# Patient Record
Sex: Male | Born: 1950 | Race: White | Hispanic: No | Marital: Married | Smoking: Current every day smoker
Health system: Southern US, Community
[De-identification: ages and names within clinical notes are randomized; demographics above are authoritative.]

## PROBLEM LIST (undated history)

## (undated) DIAGNOSIS — F419 Anxiety disorder, unspecified: Secondary | ICD-10-CM

## (undated) DIAGNOSIS — M1990 Unspecified osteoarthritis, unspecified site: Secondary | ICD-10-CM

## (undated) DIAGNOSIS — G629 Polyneuropathy, unspecified: Secondary | ICD-10-CM

## (undated) DIAGNOSIS — C801 Malignant (primary) neoplasm, unspecified: Secondary | ICD-10-CM

## (undated) DIAGNOSIS — C3490 Malignant neoplasm of unspecified part of unspecified bronchus or lung: Secondary | ICD-10-CM

## (undated) DIAGNOSIS — H9190 Unspecified hearing loss, unspecified ear: Secondary | ICD-10-CM

## (undated) DIAGNOSIS — E039 Hypothyroidism, unspecified: Secondary | ICD-10-CM

## (undated) DIAGNOSIS — C029 Malignant neoplasm of tongue, unspecified: Secondary | ICD-10-CM

## (undated) DIAGNOSIS — I10 Essential (primary) hypertension: Secondary | ICD-10-CM

## (undated) DIAGNOSIS — E7800 Pure hypercholesterolemia, unspecified: Secondary | ICD-10-CM

## (undated) DIAGNOSIS — J40 Bronchitis, not specified as acute or chronic: Secondary | ICD-10-CM

## (undated) DIAGNOSIS — F32A Depression, unspecified: Secondary | ICD-10-CM

## (undated) DIAGNOSIS — I499 Cardiac arrhythmia, unspecified: Secondary | ICD-10-CM

## (undated) DIAGNOSIS — K219 Gastro-esophageal reflux disease without esophagitis: Secondary | ICD-10-CM

## (undated) DIAGNOSIS — F329 Major depressive disorder, single episode, unspecified: Secondary | ICD-10-CM

## (undated) HISTORY — DX: Hypothyroidism, unspecified: E03.9

## (undated) HISTORY — DX: Malignant neoplasm of tongue, unspecified: C02.9

## (undated) HISTORY — DX: Pure hypercholesterolemia, unspecified: E78.00

## (undated) HISTORY — PX: BACK SURGERY: SHX140

## (undated) HISTORY — DX: Bronchitis, not specified as acute or chronic: J40

## (undated) HISTORY — PX: MOUTH SURGERY: SHX715

## (undated) HISTORY — PX: SPINAL FUSION: SHX223

## (undated) HISTORY — PX: OTHER SURGICAL HISTORY: SHX169

## (undated) HISTORY — PX: SHOULDER ARTHROSCOPY: SHX128

## (undated) HISTORY — PX: KNEE ARTHROSCOPY: SUR90

## (undated) HISTORY — DX: Essential (primary) hypertension: I10

## (undated) HISTORY — DX: Polyneuropathy, unspecified: G62.9

## (undated) HISTORY — PX: CERVICAL DISC SURGERY: SHX588

## (undated) HISTORY — PX: SPINE SURGERY: SHX786

---

## 2000-09-27 ENCOUNTER — Emergency Department (HOSPITAL_COMMUNITY): Admission: EM | Admit: 2000-09-27 | Discharge: 2000-09-27 | Payer: Self-pay | Admitting: Emergency Medicine

## 2002-07-02 ENCOUNTER — Encounter (HOSPITAL_COMMUNITY): Admission: RE | Admit: 2002-07-02 | Discharge: 2002-08-01 | Payer: Self-pay | Admitting: Orthopedic Surgery

## 2002-08-03 ENCOUNTER — Encounter (HOSPITAL_COMMUNITY): Admission: RE | Admit: 2002-08-03 | Discharge: 2002-09-02 | Payer: Self-pay | Admitting: Orthopedic Surgery

## 2002-08-23 ENCOUNTER — Inpatient Hospital Stay (HOSPITAL_COMMUNITY): Admission: RE | Admit: 2002-08-23 | Discharge: 2002-08-24 | Payer: Self-pay | Admitting: Neurosurgery

## 2002-08-23 ENCOUNTER — Encounter: Payer: Self-pay | Admitting: Neurosurgery

## 2003-07-18 ENCOUNTER — Ambulatory Visit (HOSPITAL_COMMUNITY): Admission: RE | Admit: 2003-07-18 | Discharge: 2003-07-18 | Payer: Self-pay | Admitting: Orthopaedic Surgery

## 2004-12-27 ENCOUNTER — Emergency Department (HOSPITAL_COMMUNITY): Admission: EM | Admit: 2004-12-27 | Discharge: 2004-12-27 | Payer: Self-pay | Admitting: Emergency Medicine

## 2005-04-03 ENCOUNTER — Ambulatory Visit: Admission: RE | Admit: 2005-04-03 | Discharge: 2005-06-14 | Payer: Self-pay | Admitting: Radiation Oncology

## 2006-07-31 ENCOUNTER — Ambulatory Visit (HOSPITAL_COMMUNITY): Admission: RE | Admit: 2006-07-31 | Discharge: 2006-07-31 | Payer: Self-pay | Admitting: Radiation Oncology

## 2006-09-26 ENCOUNTER — Emergency Department (HOSPITAL_COMMUNITY): Admission: EM | Admit: 2006-09-26 | Discharge: 2006-09-26 | Payer: Self-pay | Admitting: Emergency Medicine

## 2009-02-28 ENCOUNTER — Ambulatory Visit (HOSPITAL_COMMUNITY): Admission: RE | Admit: 2009-02-28 | Discharge: 2009-02-28 | Payer: Self-pay | Admitting: Family Medicine

## 2009-05-02 ENCOUNTER — Encounter (HOSPITAL_COMMUNITY): Admission: RE | Admit: 2009-05-02 | Discharge: 2009-06-01 | Payer: Self-pay | Admitting: Orthopaedic Surgery

## 2010-02-25 ENCOUNTER — Encounter: Payer: Self-pay | Admitting: Otolaryngology

## 2010-06-22 NOTE — Op Note (Signed)
NAME:  Darrell White, Darrell White                         ACCOUNT NO.:  [id number]   MEDICAL RECORD NO.:  [id number]                   PATIENT TYPE:  AMB   LOCATION:  DAY                                  FACILITY:  [facility]   PHYSICIAN:  J. Darreld Mclean, M.D.              DATE OF BIRTH:  16-Nov-1950   DATE OF PROCEDURE:  DATE OF DISCHARGE:                                 OPERATIVE REPORT   PREOPERATIVE DIAGNOSIS:  Olecranon bursitis, right elbow.   POSTOPERATIVE DIAGNOSIS:  Olecranon bursitis, right elbow.   PROCEDURE:  Excision of right olecranon bursa of the elbow.   ANESTHESIA:  Bier block.   SURGEON:  J. Darreld Mclean, M.D.   ASSISTANT:  Candace Cruise, [facility]   TOURNIQUET TIME:  Please refer to anesthesia record.   DRAIN:  Penrose drain was used.   The patient is a 60 year old male with pain and tenderness in his right  elbow. He has swelling and tenderness.  This has not resolved with  conservative treatment.  He has had it aspirated twice by his family  physician.  He is advised to have it excised.  The risks and imponderables  have been discussed with the patient preoperatively.  He appears to  understand and agreed to the procedure as outlined.  He understands that he  will have a Penrose drain and he is to come back to the office tomorrow for  removal.   DESCRIPTION OF PROCEDURE:  The patient was seen in the holding area.  His  right elbow was identified as the correct site of the surgery. I placed a  mark and he placed a mark on the elbow.  He was taken to the operating room  and Bier block anesthesia was given.  Once again, we reconfirmed the right  elbow as the correct anatomical site for excision of olecranon bursa.  The  patient was prepped and draped in the usual manner.  Bier block anesthesia  was given.   Incision was made directly over the lesion with careful dissection of the  bursa and the wound was excised.  It was opened during the procedure and  fluid was removed.   The bursa was removed without any difficulty.  Bovie was  used to help coagulate small bleeders and then the wound was reapproximated  using 2-0 plain and skin staples.  Penrose drain was inserted and a safety pin applied to it. It was not sewn  in.  A large bulky dressing applied. Ace bandage applied.  The patient will  be seen in the office tomorrow.  Went to recovery in good condition.  Prescription for Tylox given for pain.  If any difficulty come back to the  emergency room tonight.      ___________________________________________  Teola Bradley, M.D.   JWK/MEDQ  D:  07/18/2003  T:  07/18/2003  Job:  [id number]

## 2010-06-22 NOTE — H&P (Signed)
NAME:  Darrell White, Darrell White NO.:  [id number]   MEDICAL RECORD NO.:  [id number]                  PATIENT TYPE:   LOCATION:                                       FACILITY:   PHYSICIAN:  J. Darreld Mclean, M.D.              DATE OF BIRTH:   DATE OF ADMISSION:  DATE OF DISCHARGE:                                HISTORY & PHYSICAL   CHIEF COMPLAINT:  Right olecranon bursitis.   The patient is a 60 year old male with pain and tenderness in his right  elbow.  He has had swelling around the elbow for several weeks that became  progressively tender.  He was seen by his family doctors, Dr. Garner Nash and  Dr. Dimas Aguas, and has had it aspirated twice.  It was aspirated two days ago,  and it was already swollen again and tender.  He says it gets in his way,  and he would like to have it excised.  He thinks he hit it about a month  ago, he does not know exactly, but it has been swollen since then.   PAST MEDICAL HISTORY:  Negative except for a history of ulcer disease.  He  denies heart disease, lung disease, kidney disease, stroke, paralysis,  weakness, hypertension, diabetes, TB, cancer, and circulatory problems.   He denies any allergies.   He is currently taking Elavil 50 mg at night and Percocet 2-3 times a day as  needed.   He smokes 2-3 packs of cigarettes per day.  He does not use alcoholic  beverages.  He finished school through the 6th grade.   He has had seven back operations over the years in [place].  He has had  knee surgery last year by Dr. _____________ .  He has had shoulder surgery  in [place] and a ruptured disk in his neck at [facility].   His father had cancer, and he has had a brother and sister with cancer.   The patient lives in [place], and he is married.   PHYSICAL EXAMINATION:  VITAL SIGNS:  BP 122/80, pulse 100, respiration 20,  afebrile.  Height 5 feet 9-1/4.  Weight 220.  HEENT:  Negative.  NECK:  Supple.  LUNGS:  Clear to  P&A.  HEART:  Regular rhythm without murmur heard.  ABDOMEN:  Soft and nontender without masses.  EXTREMITIES:  Right elbow has a large olecranon bursa on the right, size of  maybe half of a lime.  It is tender.  It is not inflamed.  Range of motion  of the elbow is full.  Neurovascular is intact.  The other extremities  within normal limits.  CNS:  Intact.  SKIN:  Intact.   IMPRESSION:  Right elbow olecranon bursitis.   PLAN:  Excision of bursa.  I explained to the patient the risks and  imponderables of the procedure.  Have a drain placed to be removed the  following  day in the office.  His labs are pending.  The patient appears to  understand the procedure after the appropriate questions.     ___________________________________________                                         Teola Bradley, M.D.   JWK/MEDQ  D:  07/14/2003  T:  07/14/2003  Job:  [id number]

## 2010-06-22 NOTE — Op Note (Signed)
NAME:  Darrell White, Darrell White                         ACCOUNT NO.:  [id number]   MEDICAL RECORD NO.:  [id number]                   PATIENT TYPE:  INP   LOCATION:  3172                                 FACILITY:  [facility]   PHYSICIAN:  Hewitt Shorts, M.D.            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   PROCEDURE:  C5-6 and C6-7 anterior cervical diskectomy, arthrodesis with  allograft and tethered cervical plate.   SURGEON:  Hewitt Shorts, M.D.   ASSISTANT:  Danae Orleans. Venetia Maxon, M.D.   ANESTHESIA:  General endotracheal.   INDICATIONS:  The patient is a 60 year old man who presented with pain  extending from his neck to the right shoulder, down the right scapula into  the right arm.  MRI scan revealed spondylitic disk herniations at C5-6 and  C6-7 with underlying spondylosis and degenerative disk disease.  Decision  was made to proceed with two level anterior cervical diskectomy and  arthrodesis.   DESCRIPTION OF PROCEDURE:  The patient was brought to the operating room and  placed under general endotracheal anesthesia.  The patient was placed in 10  pounds of Holter traction.  The neck was prepped with Betadine soap and  solution and draped in a sterile fashion.  A horizontal incision was made on  the left side of the neck.  The line of the incision was infiltrated with  epinephrine.  Incision was made and carried down to the subcutaneous tissue.  Bipolar cautery and electrocautery were used to maintain hemostasis.  Dissection was carried down to the platysma.  Dissection was carried down to  the avascular plane and  surfaces of the mastoid and parotid artery injected  avoiding the trachea and esophagus medially.  The ventral aspect of the  vertebral column was identified and localizing x-ray was taken at C5-6 and  C6-7, intravertebral disk space identified.  Diskectomy was begun with the  incision on the annulus and continued with microcurets and pituitary  rongeurs.  The end plates of the corresponding vertebrae were removed using  microcurets and a Micromax drill.  The anterior aspect of the overgrowth was  removed using the Micromax drill along with an osteophyte removal tool.  The  microscope was draped and brought into the field to provide instrument  magnification, illumination, and visualization.  The remaining diskectomy  was performed using microdissection and microsurgical technique.  There was  significant posterior osteophytic overgrowth and small __________ overgrowth  at each level.  This was carefully removed with Micromax drill along with a  2 mm Kerrison punch with a thin flip plate.  The posterior longitudinal  ligament was thickened at each level and the pseudoimposed upon this was  calcified disk herniation as well as some soft disk herniation at each  level.  This was removed, and we were able to decompress the spinal canal  and thecal sac, and then we were able to decompress the foramina bilaterally  with particular attention paid to the right side of the foramina at both the  C5-6 and C6-7 levels.  In the end, PT compression of the thecal sac and  foramina was achieved bilaterally at each level, and once the decompression  was completed, hemostasis was established with the use of Gelfoam soaked in  thrombin.  Two 8 mm grafts of iliac crest tricortical graft were hydrated in  saline solution and then positioned in addition in the intravertebral disk  space at each level and countersunk.  The cervical traction was  discontinued.  We then selected a 35 mm tethered cervical plate  which was  positioned over the fusion construct and secured to the C5 and C7 vertebrae  with a pair of 4 x 14 mm screws and a single 4 x 14 mm screw was used at C6.  These screw holes were drilled and tapped.  The screw was placed.  The  screws were placed in an alternating fashion.  Once all five screws were  replaced, the final tightening was done over all five screws and then the  wound was irrigated with Bacitracin solution and checked for hemostasis  which was established and confirmed.  An x-ray was taken.  Visualization was  limited due to his large shoulders.  Screws were in good position at C5 and  C6.  The wound was then closed in multiple layers.  The platysma was closed  with interrupted inverted 2-0 undyed Vicryl sutures.  The subcutaneous and  subcuticular were closed with 3-0 undyed Vicryl sutures.  The skin was  approximated with Dermabond.  The patient tolerated the procedure well.  The  estimated blood loss 100 mL.  Sponge and needle counts were correct.  Following surgery, the patient is to be extubated and transferred to the  recovery room for further care.                                               Hewitt Shorts, M.D.    RWN/MEDQ  D:  08/23/2002  T:  08/23/2002  Job:  [id number]

## 2010-08-28 DIAGNOSIS — R072 Precordial pain: Secondary | ICD-10-CM

## 2012-03-25 ENCOUNTER — Encounter (HOSPITAL_COMMUNITY): Payer: Self-pay | Admitting: Pharmacy Technician

## 2012-04-02 ENCOUNTER — Encounter (HOSPITAL_COMMUNITY)
Admission: RE | Admit: 2012-04-02 | Discharge: 2012-04-02 | Disposition: A | Discharge status: Home / Self Care | Payer: Medicare Other | Source: Ambulatory Visit | Attending: Ophthalmology | Admitting: Ophthalmology

## 2012-04-02 ENCOUNTER — Other Ambulatory Visit: Payer: Self-pay

## 2012-04-02 ENCOUNTER — Encounter (HOSPITAL_COMMUNITY): Payer: Self-pay

## 2012-04-02 HISTORY — DX: Gastro-esophageal reflux disease without esophagitis: K21.9

## 2012-04-02 HISTORY — DX: Anxiety disorder, unspecified: F41.9

## 2012-04-02 HISTORY — DX: Unspecified hearing loss, unspecified ear: H91.90

## 2012-04-02 HISTORY — DX: Unspecified osteoarthritis, unspecified site: M19.90

## 2012-04-02 HISTORY — DX: Malignant (primary) neoplasm, unspecified: C80.1

## 2012-04-02 LAB — BASIC METABOLIC PANEL
BUN: 18 mg/dL (ref 6–23)
CO2: 26 mEq/L (ref 19–32)
Chloride: 100 mEq/L (ref 96–112)
Creatinine, Ser: 1.04 mg/dL (ref 0.50–1.35)
GFR calc Af Amer: 88 mL/min — ABNORMAL LOW (ref >90)
GFR calc non Af Amer: 76 mL/min — ABNORMAL LOW (ref >90)
Glucose, Bld: 150 mg/dL — ABNORMAL HIGH (ref 70–99)
Potassium: 4.1 mEq/L (ref 3.5–5.1)
Sodium: 137 mEq/L (ref 135–145)

## 2012-04-02 LAB — HEMOGLOBIN AND HEMATOCRIT, BLOOD
HCT: 40.4 % (ref 39.0–52.0)
Hemoglobin: 14 g/dL (ref 13.0–17.0)

## 2012-04-02 NOTE — Patient Instructions (Addendum)
Your procedure is scheduled on:  04/09/12  Report to [facility] at 12:30 PM.  Call this number if you have problems the morning of surgery: [phone]   Remember:   Do not eat or drink:After Midnight.  Take these medicines the morning of surgery with A SIP OF WATER: Fluoxetine. May take your Oxycodone only if needed.   Do not wear jewelry, make-up or nail polish.  Do not wear lotions, powders, or perfumes. You may wear deodorant.  Do not shave 48 hours prior to surgery. Men may shave face and neck.  Do not bring valuables to the hospital.  Contacts, dentures or bridgework may not be worn into surgery.  Leave suitcase in the car. After surgery it may be brought to your room.  For patients admitted to the hospital, checkout time is 11:00 AM the day of discharge.   Patients discharged the day of surgery will not be allowed to drive home.    Special Instructions: Start using your eye drops before surgery as directed by your eye doctor.   Please read over the following fact sheets that you were given: Anesthesia Post-op Instructions    Cataract Surgery  A cataract is a clouding of the lens of the eye. When a lens becomes cloudy, vision is reduced based on the degree and nature of the clouding. Surgery may be needed to improve vision. Surgery removes the cloudy lens and usually replaces it with a substitute lens (intraocular lens, IOL). LET YOUR EYE DOCTOR KNOW ABOUT:  Allergies to food or medicine.  Medicines taken including herbs, eyedrops, over-the-counter medicines, and creams.  Use of steroids (by mouth or creams).  Previous problems with anesthetics or numbing medicine.  History of bleeding problems or blood clots.  Previous surgery.  Other health problems, including diabetes and kidney problems.  Possibility of pregnancy, if this applies. RISKS AND COMPLICATIONS  Infection.  Inflammation of the eyeball (endophthalmitis) that can spread to both eyes (sympathetic  ophthalmia).  Poor wound healing.  If an IOL is inserted, it can later fall out of proper position. This is very uncommon.  Clouding of the part of your eye that holds an IOL in place. This is called an "after-cataract." These are uncommon, but easily treated. BEFORE THE PROCEDURE  Do not eat or drink anything except small amounts of water for 8 to 12 before your surgery, or as directed by your caregiver.  Unless you are told otherwise, continue any eyedrops you have been prescribed.  Talk to your primary caregiver about all other medicines that you take (both prescription and non-prescription). In some cases, you may need to stop or change medicines near the time of your surgery. This is most important if you are taking blood-thinning medicine.Do not stop medicines unless you are told to do so.  Arrange for someone to drive you to and from the procedure.  Do not put contact lenses in either eye on the day of your surgery. PROCEDURE There is more than one method for safely removing a cataract. Your doctor can explain the differences and help determine which is best for you. Phacoemulsification surgery is the most common form of cataract surgery.  An injection is given behind the eye or eyedrops are given to make this a painless procedure.  A small cut (incision) is made on the edge of the clear, dome-shaped surface that covers the front of the eye (cornea).  A tiny probe is painlessly inserted into the eye. This device gives off ultrasound waves  that soften and break up the cloudy center of the lens. This makes it easier for the cloudy lens to be removed by suction.  An IOL may be implanted.  The normal lens of the eye is covered by a clear capsule. Part of that capsule is intentionally left in the eye to support the IOL.  Your surgeon may or may not use stitches to close the incision. There are other forms of cataract surgery that require a larger incision and stiches to close the  eye. This approach is taken in cases where the doctor feels that the cataract cannot be easily removed using phacoemulsification. AFTER THE PROCEDURE  When an IOL is implanted, it does not need care. It becomes a permanent part of your eye and cannot be seen or felt.  Your doctor will schedule follow-up exams to check on your progress.  Review your other medicines with your doctor to see which can be resumed after surgery.  Use eyedrops or take medicine as prescribed by your doctor. Document Released: 01/10/2011 Document Revised: 04/15/2011 Document Reviewed: 01/10/2011 [facility] Patient Information 2013 [place], [place].    PATIENT INSTRUCTIONS POST-ANESTHESIA  IMMEDIATELY FOLLOWING SURGERY:  Do not drive or operate machinery for the first twenty four hours after surgery.  Do not make any important decisions for twenty four hours after surgery or while taking narcotic pain medications or sedatives.  If you develop intractable nausea and vomiting or a severe headache please notify your doctor immediately.  FOLLOW-UP:  Please make an appointment with your surgeon as instructed. You do not need to follow up with anesthesia unless specifically instructed to do so.  WOUND CARE INSTRUCTIONS (if applicable):  Keep a dry clean dressing on the anesthesia/puncture wound site if there is drainage.  Once the wound has quit draining you may leave it open to air.  Generally you should leave the bandage intact for twenty four hours unless there is drainage.  If the epidural site drains for more than 36-48 hours please call the anesthesia department.  QUESTIONS?:  Please feel free to call your physician or the hospital operator if you have any questions, and they will be happy to assist you.

## 2012-04-08 MED ORDER — CYCLOPENTOLATE-PHENYLEPHRINE 0.2-1 % OP SOLN
OPHTHALMIC | Status: AC
  Filled 2012-04-08: qty 2

## 2012-04-08 MED ORDER — LIDOCAINE HCL (PF) 1 % IJ SOLN
INTRAMUSCULAR | Status: AC
  Filled 2012-04-08: qty 2

## 2012-04-08 MED ORDER — TETRACAINE HCL 0.5 % OP SOLN
OPHTHALMIC | Status: AC
  Filled 2012-04-08: qty 2

## 2012-04-08 MED ORDER — LIDOCAINE HCL 3.5 % OP GEL
OPHTHALMIC | Status: AC
  Filled 2012-04-08: qty 5

## 2012-04-08 MED ORDER — NEOMYCIN-POLYMYXIN-DEXAMETH 3.5-10000-0.1 OP OINT
TOPICAL_OINTMENT | OPHTHALMIC | Status: AC
  Filled 2012-04-08: qty 3.5

## 2012-04-09 ENCOUNTER — Ambulatory Visit (HOSPITAL_COMMUNITY)
Admission: RE | Admit: 2012-04-09 | Discharge: 2012-04-09 | Disposition: A | Discharge status: Home / Self Care | Payer: Medicare Other | Source: Ambulatory Visit | Attending: Ophthalmology | Admitting: Ophthalmology

## 2012-04-09 ENCOUNTER — Encounter (HOSPITAL_COMMUNITY): Payer: Self-pay | Admitting: Anesthesiology

## 2012-04-09 ENCOUNTER — Ambulatory Visit (HOSPITAL_COMMUNITY): Payer: Medicare Other | Admitting: Anesthesiology

## 2012-04-09 ENCOUNTER — Encounter (HOSPITAL_COMMUNITY)
Admission: RE | Disposition: A | Discharge status: Home / Self Care | Payer: Self-pay | Source: Ambulatory Visit | Attending: Ophthalmology

## 2012-04-09 ENCOUNTER — Encounter (HOSPITAL_COMMUNITY): Payer: Self-pay | Admitting: *Deleted

## 2012-04-09 DIAGNOSIS — H2589 Other age-related cataract: Secondary | ICD-10-CM | POA: Insufficient documentation

## 2012-04-09 DIAGNOSIS — Z01810 Encounter for preprocedural cardiovascular examination: Secondary | ICD-10-CM | POA: Insufficient documentation

## 2012-04-09 DIAGNOSIS — Z01812 Encounter for preprocedural laboratory examination: Secondary | ICD-10-CM | POA: Insufficient documentation

## 2012-04-09 DIAGNOSIS — H26119 Localized traumatic opacities, unspecified eye: Secondary | ICD-10-CM | POA: Insufficient documentation

## 2012-04-09 HISTORY — PX: CATARACT EXTRACTION W/PHACO: SHX586

## 2012-04-09 SURGERY — PHACOEMULSIFICATION, CATARACT, WITH IOL INSERTION
Anesthesia: Monitor Anesthesia Care | Site: Eye | Laterality: Left | Wound class: Clean

## 2012-04-09 MED ORDER — BSS IO SOLN
INTRAOCULAR | Status: DC | PRN
  Administered 2012-04-09: 15 mL via INTRAOCULAR

## 2012-04-09 MED ORDER — MIDAZOLAM HCL 2 MG/2ML IJ SOLN
1.0000 mg | INTRAMUSCULAR | Status: DC | PRN
  Administered 2012-04-09: 2 mg via INTRAVENOUS

## 2012-04-09 MED ORDER — ONDANSETRON HCL 4 MG/2ML IJ SOLN: 4.0000 mg | Freq: Once | INTRAMUSCULAR | Status: AC | PRN

## 2012-04-09 MED ORDER — PROVISC 10 MG/ML IO SOLN
INTRAOCULAR | Status: DC | PRN
  Administered 2012-04-09: 8.5 mg via INTRAOCULAR

## 2012-04-09 MED ORDER — POVIDONE-IODINE 5 % OP SOLN
OPHTHALMIC | Status: DC | PRN
  Administered 2012-04-09: 1 via OPHTHALMIC

## 2012-04-09 MED ORDER — TETRACAINE HCL 0.5 % OP SOLN
1.0000 [drp] | OPHTHALMIC | Status: AC
  Administered 2012-04-09 (×3): 1 [drp] via OPHTHALMIC

## 2012-04-09 MED ORDER — EPINEPHRINE HCL 1 MG/ML IJ SOLN
INTRAOCULAR | Status: DC | PRN
  Administered 2012-04-09: 12:00:00

## 2012-04-09 MED ORDER — LIDOCAINE 3.5 % OP GEL OPTIME - NO CHARGE
OPHTHALMIC | Status: DC | PRN
  Administered 2012-04-09: 1 [drp] via OPHTHALMIC

## 2012-04-09 MED ORDER — PHENYLEPHRINE HCL 2.5 % OP SOLN
OPHTHALMIC | Status: AC
  Filled 2012-04-09: qty 2

## 2012-04-09 MED ORDER — NEOMYCIN-POLYMYXIN-DEXAMETH 0.1 % OP OINT
TOPICAL_OINTMENT | OPHTHALMIC | Status: DC | PRN
  Administered 2012-04-09: 1 via OPHTHALMIC

## 2012-04-09 MED ORDER — MIDAZOLAM HCL 2 MG/2ML IJ SOLN
INTRAMUSCULAR | Status: AC
  Filled 2012-04-09: qty 2

## 2012-04-09 MED ORDER — PHENYLEPHRINE HCL 2.5 % OP SOLN
1.0000 [drp] | OPHTHALMIC | Status: AC
  Administered 2012-04-09 (×3): 1 [drp] via OPHTHALMIC

## 2012-04-09 MED ORDER — EPINEPHRINE HCL 1 MG/ML IJ SOLN
INTRAMUSCULAR | Status: AC
  Filled 2012-04-09: qty 1

## 2012-04-09 MED ORDER — LACTATED RINGERS IV SOLN
INTRAVENOUS | Status: DC
  Administered 2012-04-09: 12:00:00 via INTRAVENOUS

## 2012-04-09 MED ORDER — TRYPAN BLUE 0.06 % OP SOLN
OPHTHALMIC | Status: AC
  Filled 2012-04-09: qty 0.5

## 2012-04-09 MED ORDER — CYCLOPENTOLATE-PHENYLEPHRINE 0.2-1 % OP SOLN
1.0000 [drp] | OPHTHALMIC | Status: AC
  Administered 2012-04-09 (×3): 1 [drp] via OPHTHALMIC

## 2012-04-09 MED ORDER — FENTANYL CITRATE 0.05 MG/ML IJ SOLN: 25.0000 ug | INTRAMUSCULAR | Status: DC | PRN

## 2012-04-09 MED ORDER — LIDOCAINE HCL (PF) 1 % IJ SOLN
INTRAMUSCULAR | Status: DC | PRN
  Administered 2012-04-09: .5 mL

## 2012-04-09 MED ORDER — LIDOCAINE HCL 3.5 % OP GEL
1.0000 "application " | Freq: Once | OPHTHALMIC | Status: AC
  Administered 2012-04-09: 1 via OPHTHALMIC

## 2012-04-09 SURGICAL SUPPLY — 32 items

## 2012-04-09 NOTE — OR Nursing (Signed)
States he has intolerance to anti inflamatory medication. Unsure of med. names

## 2012-04-09 NOTE — H&P (Signed)
I have reviewed the H&P, the patient was re-examined, and I have identified no interval changes in medical condition and plan of care since the history and physical of record  

## 2012-04-09 NOTE — OR Nursing (Signed)
Morphine pump used for back and leg pain

## 2012-04-09 NOTE — Anesthesia Preprocedure Evaluation (Signed)
Anesthesia Evaluation  Patient identified by MRN, date of birth, ID band Patient awake    Reviewed: Allergy & Precautions, H&P , NPO status , Patient's Chart, lab work & pertinent test results  Airway Mallampati: II      Dental  (+) Edentulous Upper and Edentulous Lower   Pulmonary neg pulmonary ROS,  breath sounds clear to auscultation        Cardiovascular negative cardio ROS  Rhythm:Regular Rate:Normal     Neuro/Psych Anxiety    GI/Hepatic GERD-  Medicated and Controlled,  Endo/Other    Renal/GU      Musculoskeletal  (+) Arthritis - (cervical fusion),   Abdominal   Peds  Hematology   Anesthesia Other Findings   Reproductive/Obstetrics                           Anesthesia Physical Anesthesia Plan  ASA: II  Anesthesia Plan: MAC   Post-op Pain Management:    Induction: Intravenous  Airway Management Planned: Nasal Cannula  Additional Equipment:   Intra-op Plan:   Post-operative Plan:   Informed Consent: I have reviewed the patients History and Physical, chart, labs and discussed the procedure including the risks, benefits and alternatives for the proposed anesthesia with the patient or authorized representative who has indicated his/her understanding and acceptance.     Plan Discussed with:   Anesthesia Plan Comments:         Anesthesia Quick Evaluation

## 2012-04-09 NOTE — Anesthesia Postprocedure Evaluation (Signed)
  Anesthesia Post-op Note  Patient: Darrell White  Procedure(s) Performed: Procedure(s) (LRB): CATARACT EXTRACTION PHACO AND INTRAOCULAR LENS PLACEMENT (IOC) (Left)  Patient Location:  Short Stay  Anesthesia Type: MAC  Level of Consciousness: awake  Airway and Oxygen Therapy: Patient Spontanous Breathing  Post-op Pain: none  Post-op Assessment: Post-op Vital signs reviewed, Patient's Cardiovascular Status Stable, Respiratory Function Stable, Patent Airway, No signs of Nausea or vomiting and Pain level controlled  Post-op Vital Signs: Reviewed and stable  Complications: No apparent anesthesia complications

## 2012-04-09 NOTE — OR Nursing (Signed)
Morphine pump in right side ,  Unable to state dosage

## 2012-04-09 NOTE — OR Nursing (Signed)
Takes a nerve pill unsure of name and takes daily.

## 2012-04-09 NOTE — Brief Op Note (Signed)
Pre-Op Dx: Cataract OS Post-Op Dx: Cataract OS Surgeon: Hunt, Kerry Anesthesia: Topical with MAC Surgery: Cataract Extraction with Intraocular lens Implant OS Implant: B&L enVista Specimen: None Complications: None 

## 2012-04-09 NOTE — Transfer of Care (Signed)
Immediate Anesthesia Transfer of Care Note  Patient: Darrell White  Procedure(s) Performed: Procedure(s) (LRB): CATARACT EXTRACTION PHACO AND INTRAOCULAR LENS PLACEMENT (IOC) (Left)  Patient Location: Shortstay  Anesthesia Type: MAC  Level of Consciousness: awake  Airway & Oxygen Therapy: Patient Spontanous Breathing   Post-op Assessment: Report given to PACU RN, Post -op Vital signs reviewed and stable and Patient moving all extremities  Post vital signs: Reviewed and stable  Complications: No apparent anesthesia complications

## 2012-04-10 NOTE — Op Note (Signed)
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  PROCEDURE PERFORMED:  Phacoemulsification, posterior chamber intraocular lens implantation, left eye.  SURGEON:  Susanne Greenhouse, MD  ANESTHESIA:  Topical with monitored anesthesia care and IV sedation.  OPERATIVE SUMMARY:  In the preoperative area, dilating drops were placed into the left eye.  The patient was then brought into the operating room where she was placed under general anesthesia.  The eye was then prepped and draped.  Beginning with a 75 blade, a paracentesis port was made at the surgeon's 2 o'clock position.  The anterior chamber was then filled with a 1% nonpreserved lidocaine solution with epinephrine.  This was followed by Viscoat to deepen the chamber.  A small fornix-based peritomy was performed superiorly.  Next, a single iris hook was placed through the limbus superiorly.  A 2.4-mm keratome blade was then used to make a clear corneal incision over the iris hook.  A bent cystotome needle and Utrata forceps were used to create a continuous tear capsulotomy.  Hydrodissection was performed using balanced salt solution on a fine cannula.  The lens nucleus was then removed using phacoemulsification in a quadrant cracking technique.  The cortical material was then removed with irrigation and aspiration.  The capsular bag and anterior chamber were refilled with Provisc.  The wound was widened to approximately 3 mm and a posterior  chamber intraocular lens was placed into the capsular bag without difficulty using an [employer] lens injecting system.  A single 10-0 nylon suture was then used to close the incision as well as stromal hydration.  The Provisc was removed from the anterior chamber and capsular bag with irrigation and aspiration.  At this point, the wounds were tested for leak, which were negative.  The anterior chamber remained deep and stable.  The patient tolerated the procedure well.  There were no operative complications, and she awoke from general anesthesia without problem.  No surgical specimens.  Prosthetic device used is a Bausch and Lomb posterior chamber lens, model MX60, power of 22.0, serial number is [id number].          ______________________________ Susanne Greenhouse, MD     KEH/MEDQ  D:  04/09/2012  T:  04/10/2012  Job:  [id number]

## 2012-04-14 ENCOUNTER — Encounter (HOSPITAL_COMMUNITY): Payer: Self-pay | Admitting: Ophthalmology

## 2012-08-19 ENCOUNTER — Emergency Department (HOSPITAL_COMMUNITY)
Admission: EM | Admit: 2012-08-19 | Discharge: 2012-08-19 | Disposition: A | Discharge status: Home / Self Care | Payer: Medicare Other | Attending: Emergency Medicine | Admitting: Emergency Medicine

## 2012-08-19 ENCOUNTER — Emergency Department (HOSPITAL_COMMUNITY): Payer: Medicare Other

## 2012-08-19 ENCOUNTER — Encounter (HOSPITAL_COMMUNITY): Payer: Self-pay

## 2012-08-19 DIAGNOSIS — Z8739 Personal history of other diseases of the musculoskeletal system and connective tissue: Secondary | ICD-10-CM | POA: Insufficient documentation

## 2012-08-19 DIAGNOSIS — R51 Headache: Secondary | ICD-10-CM | POA: Insufficient documentation

## 2012-08-19 DIAGNOSIS — R6884 Jaw pain: Secondary | ICD-10-CM | POA: Insufficient documentation

## 2012-08-19 DIAGNOSIS — Z85819 Personal history of malignant neoplasm of unspecified site of lip, oral cavity, and pharynx: Secondary | ICD-10-CM | POA: Insufficient documentation

## 2012-08-19 DIAGNOSIS — J029 Acute pharyngitis, unspecified: Secondary | ICD-10-CM | POA: Insufficient documentation

## 2012-08-19 DIAGNOSIS — M542 Cervicalgia: Secondary | ICD-10-CM | POA: Insufficient documentation

## 2012-08-19 DIAGNOSIS — Z9889 Other specified postprocedural states: Secondary | ICD-10-CM | POA: Insufficient documentation

## 2012-08-19 DIAGNOSIS — F17200 Nicotine dependence, unspecified, uncomplicated: Secondary | ICD-10-CM | POA: Insufficient documentation

## 2012-08-19 DIAGNOSIS — F411 Generalized anxiety disorder: Secondary | ICD-10-CM | POA: Insufficient documentation

## 2012-08-19 DIAGNOSIS — H9190 Unspecified hearing loss, unspecified ear: Secondary | ICD-10-CM | POA: Insufficient documentation

## 2012-08-19 DIAGNOSIS — Z8719 Personal history of other diseases of the digestive system: Secondary | ICD-10-CM | POA: Insufficient documentation

## 2012-08-19 LAB — CBC WITH DIFFERENTIAL/PLATELET
Basophils Absolute: 0.1 10*3/uL (ref 0.0–0.1)
HCT: 40 % (ref 39.0–52.0)
Lymphocytes Relative: 27 % (ref 12–46)
Neutro Abs: 3.5 10*3/uL (ref 1.7–7.7)
Platelets: 174 10*3/uL (ref 150–400)
RDW: 13.3 % (ref 11.5–15.5)
WBC: 5.7 10*3/uL (ref 4.0–10.5)

## 2012-08-19 LAB — BASIC METABOLIC PANEL
CO2: 32 mEq/L (ref 19–32)
Chloride: 100 mEq/L (ref 96–112)
Potassium: 4 mEq/L (ref 3.5–5.1)
Sodium: 137 mEq/L (ref 135–145)

## 2012-08-19 MED ORDER — HYDROMORPHONE HCL PF 1 MG/ML IJ SOLN
1.0000 mg | Freq: Once | INTRAMUSCULAR | Status: AC
  Administered 2012-08-19: 1 mg via INTRAVENOUS
  Filled 2012-08-19: qty 1

## 2012-08-19 MED ORDER — CEPHALEXIN 500 MG PO CAPS: 500.0000 mg | ORAL_CAPSULE | Freq: Four times a day (QID) | ORAL | Status: DC

## 2012-08-19 MED ORDER — MORPHINE SULFATE 4 MG/ML IJ SOLN
6.0000 mg | Freq: Once | INTRAMUSCULAR | Status: AC
  Administered 2012-08-19: 6 mg via INTRAVENOUS
  Filled 2012-08-19: qty 2

## 2012-08-19 MED ORDER — LORAZEPAM 2 MG/ML IJ SOLN
0.5000 mg | Freq: Once | INTRAMUSCULAR | Status: AC
  Administered 2012-08-19: 0.5 mg via INTRAVENOUS
  Filled 2012-08-19: qty 1

## 2012-08-19 MED ORDER — ONDANSETRON HCL 4 MG/2ML IJ SOLN
4.0000 mg | Freq: Once | INTRAMUSCULAR | Status: AC
  Administered 2012-08-19: 4 mg via INTRAVENOUS
  Filled 2012-08-19: qty 2

## 2012-08-19 MED ORDER — TRAMADOL HCL 50 MG PO TABS: 50.0000 mg | ORAL_TABLET | Freq: Four times a day (QID) | ORAL | Status: DC | PRN

## 2012-08-19 MED ORDER — IOHEXOL 300 MG/ML  SOLN
75.0000 mL | Freq: Once | INTRAMUSCULAR | Status: AC | PRN
  Administered 2012-08-19: 75 mL via INTRAVENOUS

## 2012-08-19 NOTE — ED Notes (Signed)
Sore throat, neck pain (steel rods in my neck), and I have a headache. No history of headaches per pt.

## 2012-08-19 NOTE — ED Notes (Signed)
Pt laying in bed with lights out, c/o of headache and sore throat x 3 days

## 2012-08-19 NOTE — ED Provider Notes (Signed)
History    This chart was scribed for Darrell Lennert, MD by Darrell White, ED Scribe. This patient was seen in room [facility] and the patient's care was started at 1955.  CSN: [id number] Arrival date & time 08/19/12  1943  First MD Initiated Contact with Patient 08/19/12 1955     Chief Complaint  Patient presents with  . Headache  . Neck Pain    Patient is a 62 y.o. male presenting with headaches and neck pain. The history is provided by the patient. No language interpreter was used.  Headache Pain location:  L temporal and L parietal Radiates to:  Does not radiate Onset quality:  Gradual Chronicity:  New Similar to prior headaches: no   Relieved by:  Nothing Worsened by:  Nothing tried Ineffective treatments:  Prescription medications Associated symptoms: neck pain   Associated symptoms: no abdominal pain, no back pain, no congestion, no cough, no diarrhea, no fatigue, no fever, no numbness, no seizures and no sinus pressure   Neck Pain Pain location:  L side Pain severity:  Moderate Pain is:  Unable to specify Onset quality:  Gradual Duration:  3 days Timing:  Constant Progression:  Unchanged Chronicity:  New Relieved by:  Nothing Worsened by:  Nothing tried Ineffective treatments: Percocet. Associated symptoms: headaches   Associated symptoms: no chest pain, no fever, no numbness and no tingling    HPI Comments: Darrell White is a 62 y.o. male who presents to the Emergency Department complaining of 3 days of gradual onset, gradually worsening sore throat with left sided jaw pain that radiates down his neck. He states he has steel rods in his neck. He also complains of a severe left sided HA. He tried percocet with little relief. He denies prior similar episodes. He denies fever, pain when swallowing, arm pain or any other pain.  Past Medical History  Diagnosis Date  . Anxiety   . GERD (gastroesophageal reflux disease)   . Cancer     of mouth  . Arthritis    . HOH (hard of hearing)    Past Surgical History  Procedure Laterality Date  . Mouth surgery      removal of cancer  . Cervical disc surgery    . Shoulder arthroscopy      bilateral  . Back surgery      had total of 7 surgeries with rods and plates  . Spinal fusion    . Spine surgery      insertion of morphine pump into spine  . Knee arthroscopy      right knee  . Cataract extraction w/phaco Left 04/09/2012    Procedure: CATARACT EXTRACTION PHACO AND INTRAOCULAR LENS PLACEMENT (IOC);  Surgeon: Gemma Payor, MD;  Location: [facility];  Service: Ophthalmology;  Laterality: Left;  CDE: 12.44   No family history on file. History  Substance Use Topics  . Smoking status: Current Every Day Smoker -- 1.50 packs/day for 50 years    Types: Cigarettes  . Smokeless tobacco: Not on file  . Alcohol Use: No    Review of Systems  Constitutional: Negative for fever, appetite change and fatigue.  HENT: Positive for neck pain. Negative for congestion, sinus pressure and ear discharge.   Eyes: Negative for discharge.  Respiratory: Negative for cough.   Cardiovascular: Negative for chest pain.  Gastrointestinal: Negative for abdominal pain and diarrhea.  Genitourinary: Negative for frequency and hematuria.  Musculoskeletal: Negative for back pain.  Skin: Negative for rash.  Neurological: Positive for headaches. Negative for tingling, seizures and numbness.  Psychiatric/Behavioral: Negative for hallucinations.    Allergies  Review of patient's allergies indicates no known allergies.  Home Medications   Current Outpatient Rx  Name  Route  Sig  Dispense  Refill  . FLUoxetine (PROZAC) 20 MG capsule   Oral   Take 20 mg by mouth daily.         Marland Kitchen oxyCODONE-acetaminophen (PERCOCET) 10-325 MG per tablet   Oral   Take 1 tablet by mouth 4 (four) times daily as needed for pain.          BP 150/59  Pulse 71  Temp(Src) 97.7 F (36.5 C) (Oral)  Resp 20  Ht 5\' 8"  (1.727 m)  Wt 220 lb (99.791  kg)  BMI 33.46 kg/m2  SpO2 99%  Physical Exam  Constitutional: He is oriented to person, place, and time. He appears well-developed.  HENT:  Head: Normocephalic.  Eyes: Conjunctivae and EOM are normal. No scleral icterus.  Neck: Neck supple. No thyromegaly present.  Lateral anterior neck tender. No masses felt.  Cardiovascular: Normal rate and regular rhythm.  Exam reveals no gallop and no friction rub.   No murmur heard. Pulmonary/Chest: No stridor. He has no wheezes. He has no rales. He exhibits no tenderness.  Abdominal: He exhibits no distension. There is no tenderness. There is no rebound.  Musculoskeletal: Normal range of motion. He exhibits no edema.  Lymphadenopathy:    He has no cervical adenopathy.  Neurological: He is oriented to person, place, and time. Coordination normal.  Skin: No rash noted. No erythema.  Psychiatric: He has a normal mood and affect. His behavior is normal.    ED Course  Procedures (including critical care time) DIAGNOSTIC STUDIES: Oxygen Saturation is 99% on RA, normal by my interpretation.    COORDINATION OF CARE: 7:56 PM Discussed treatment plan which includes Dilaudid, Zofran, CBC and basic metabolic panel with pt at bedside and pt agreed to plan.   10:48 PM Rechecked pt, who reports feeling improved. Results discussed. Will discharge home.  Results for orders placed during the hospital encounter of 08/19/12  CBC WITH DIFFERENTIAL      Result Value Range   WBC 5.7  4.0 - 10.5 K/uL   RBC 4.37  4.22 - 5.81 MIL/uL   Hemoglobin 13.6  13.0 - 17.0 g/dL   HCT 16.1  09.6 - 04.5 %   MCV 91.5  78.0 - 100.0 fL   MCH 31.1  26.0 - 34.0 pg   MCHC 34.0  30.0 - 36.0 g/dL   RDW 40.9  81.1 - 91.4 %   Platelets 174  150 - 400 K/uL   Neutrophils Relative % 61  43 - 77 %   Neutro Abs 3.5  1.7 - 7.7 K/uL   Lymphocytes Relative 27  12 - 46 %   Lymphs Abs 1.5  0.7 - 4.0 K/uL   Monocytes Relative 8  3 - 12 %   Monocytes Absolute 0.5  0.1 - 1.0 K/uL    Eosinophils Relative 3  0 - 5 %   Eosinophils Absolute 0.2  0.0 - 0.7 K/uL   Basophils Relative 1  0 - 1 %   Basophils Absolute 0.1  0.0 - 0.1 K/uL  BASIC METABOLIC PANEL      Result Value Range   Sodium 137  135 - 145 mEq/L   Potassium 4.0  3.5 - 5.1 mEq/L   Chloride 100  96 - 112  mEq/L   CO2 32  19 - 32 mEq/L   Glucose, Bld 99  70 - 99 mg/dL   BUN 13  6 - 23 mg/dL   Creatinine, Ser 1.61  0.50 - 1.35 mg/dL   Calcium 9.8  8.4 - 09.6 mg/dL   GFR calc non Af Amer 77 (*) >90 mL/min   GFR calc Af Amer 90 (*) >90 mL/min   Ct Head Wo Contrast  08/19/2012   *RADIOLOGY REPORT*  Clinical Data: Headache for 3 days.  CT HEAD WITHOUT CONTRAST  Technique:  Contiguous axial images were obtained from the base of the skull through the vertex without contrast.  Comparison: None.  Findings:  Calvarium: No acute osseous abnormality.  No lytic or blastic lesion.  Orbits: No acute abnormality.  Brain: No evidence of acute abnormality, such as acute infarction, hemorrhage, hydrocephalus, or mass lesion/mass effect.  IMPRESSION:  Negative for acute intracranial abnormality.   Original Report Authenticated By: Tiburcio Pea   Ct Soft Tissue Neck W Contrast  08/19/2012   *RADIOLOGY REPORT*  Clinical Data: Left neck pain and sore throat.  Pain radiating down neck.  History of cancer status post resection and radiotherapy.  CT NECK WITH CONTRAST  Technique:  Multidetector CT imaging of the neck was performed with intravenous contrast.  Contrast: 75mL OMNIPAQUE IOHEXOL 300 MG/ML  SOLN .  Comparison: Head CT 07/31/2006  Findings: Asymmetry of the left oral tounge, the distorted appearance and slightly more surface enhancement.   No definitive mass along the surfaces of the aerodigestive tract.  No suspicious nodal enlargement.  Radiation changes including biapical fibrosis and salivary gland atrophy, particularly the submandibular glands. No evidence of osteoradionecrosis.  Cervical carotid atherosclerosis, advanced at the  bifurcations, with high-grade left external carotid artery stenosis.  The atherosclerosis has been accelerated by radiotherapy presumably.  By apical lungs are clear.  No acute osseous abnormality.  There has been C5-6 and C6-7 ACDF with ventral plate screw.  Uncertain if there is complete osseous fusion across the C6-7 level where the disc space and endplates remain well seen.  No evident hardware displacement.  IMPRESSION: 1. No acute abnormality to explain neck pain. 2.  Distortion of the left oral tongue, which may be postoperative. Recommend correlation with mucosal examination.  No lymphadenopathy in the neck. 3.  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   Original Report Authenticated By: Tiburcio Pea      No diagnosis found.  MDM  Neck pain,  Possible adenitis.  tx with keflex and follow up   The chart was scribed for me under my direct supervision.  I personally performed the history, physical, and medical decision making and all procedures in the evaluation of this patient.Darrell Lennert, MD 08/19/12 2256

## 2012-09-29 DIAGNOSIS — Z95828 Presence of other vascular implants and grafts: Secondary | ICD-10-CM | POA: Insufficient documentation

## 2015-05-05 DIAGNOSIS — E039 Hypothyroidism, unspecified: Secondary | ICD-10-CM | POA: Diagnosis not present

## 2015-05-05 DIAGNOSIS — R1310 Dysphagia, unspecified: Secondary | ICD-10-CM | POA: Diagnosis not present

## 2015-05-05 DIAGNOSIS — R072 Precordial pain: Secondary | ICD-10-CM | POA: Diagnosis not present

## 2015-05-05 DIAGNOSIS — E782 Mixed hyperlipidemia: Secondary | ICD-10-CM | POA: Diagnosis not present

## 2015-05-11 DIAGNOSIS — R079 Chest pain, unspecified: Secondary | ICD-10-CM | POA: Diagnosis not present

## 2015-06-12 DIAGNOSIS — F17210 Nicotine dependence, cigarettes, uncomplicated: Secondary | ICD-10-CM | POA: Diagnosis not present

## 2015-06-12 DIAGNOSIS — E782 Mixed hyperlipidemia: Secondary | ICD-10-CM | POA: Diagnosis not present

## 2015-06-12 DIAGNOSIS — E039 Hypothyroidism, unspecified: Secondary | ICD-10-CM | POA: Diagnosis not present

## 2015-06-12 DIAGNOSIS — K219 Gastro-esophageal reflux disease without esophagitis: Secondary | ICD-10-CM | POA: Diagnosis not present

## 2015-06-12 DIAGNOSIS — F331 Major depressive disorder, recurrent, moderate: Secondary | ICD-10-CM | POA: Diagnosis not present

## 2015-06-12 DIAGNOSIS — M545 Low back pain: Secondary | ICD-10-CM | POA: Diagnosis not present

## 2015-07-19 DIAGNOSIS — F329 Major depressive disorder, single episode, unspecified: Secondary | ICD-10-CM | POA: Diagnosis not present

## 2015-07-19 DIAGNOSIS — M5136 Other intervertebral disc degeneration, lumbar region: Secondary | ICD-10-CM | POA: Diagnosis not present

## 2015-07-19 DIAGNOSIS — G8929 Other chronic pain: Secondary | ICD-10-CM | POA: Diagnosis not present

## 2015-07-19 DIAGNOSIS — Z79891 Long term (current) use of opiate analgesic: Secondary | ICD-10-CM | POA: Diagnosis not present

## 2015-07-19 DIAGNOSIS — M5137 Other intervertebral disc degeneration, lumbosacral region: Secondary | ICD-10-CM | POA: Diagnosis not present

## 2015-12-12 DIAGNOSIS — F17210 Nicotine dependence, cigarettes, uncomplicated: Secondary | ICD-10-CM | POA: Diagnosis not present

## 2015-12-12 DIAGNOSIS — K210 Gastro-esophageal reflux disease with esophagitis: Secondary | ICD-10-CM | POA: Diagnosis not present

## 2015-12-12 DIAGNOSIS — E039 Hypothyroidism, unspecified: Secondary | ICD-10-CM | POA: Diagnosis not present

## 2015-12-12 DIAGNOSIS — R739 Hyperglycemia, unspecified: Secondary | ICD-10-CM | POA: Diagnosis not present

## 2015-12-12 DIAGNOSIS — C002 Malignant neoplasm of external lip, unspecified: Secondary | ICD-10-CM | POA: Diagnosis not present

## 2015-12-12 DIAGNOSIS — E782 Mixed hyperlipidemia: Secondary | ICD-10-CM | POA: Diagnosis not present

## 2015-12-14 DIAGNOSIS — Z0001 Encounter for general adult medical examination with abnormal findings: Secondary | ICD-10-CM | POA: Diagnosis not present

## 2015-12-14 DIAGNOSIS — E782 Mixed hyperlipidemia: Secondary | ICD-10-CM | POA: Diagnosis not present

## 2015-12-14 DIAGNOSIS — M545 Low back pain: Secondary | ICD-10-CM | POA: Diagnosis not present

## 2015-12-14 DIAGNOSIS — F17210 Nicotine dependence, cigarettes, uncomplicated: Secondary | ICD-10-CM | POA: Diagnosis not present

## 2015-12-14 DIAGNOSIS — K219 Gastro-esophageal reflux disease without esophagitis: Secondary | ICD-10-CM | POA: Diagnosis not present

## 2015-12-14 DIAGNOSIS — F331 Major depressive disorder, recurrent, moderate: Secondary | ICD-10-CM | POA: Diagnosis not present

## 2015-12-14 DIAGNOSIS — E039 Hypothyroidism, unspecified: Secondary | ICD-10-CM | POA: Diagnosis not present

## 2015-12-14 DIAGNOSIS — Z6829 Body mass index (BMI) 29.0-29.9, adult: Secondary | ICD-10-CM | POA: Diagnosis not present

## 2016-01-15 DIAGNOSIS — Z9689 Presence of other specified functional implants: Secondary | ICD-10-CM | POA: Diagnosis not present

## 2016-01-15 DIAGNOSIS — F17200 Nicotine dependence, unspecified, uncomplicated: Secondary | ICD-10-CM | POA: Diagnosis not present

## 2016-01-15 DIAGNOSIS — Z79891 Long term (current) use of opiate analgesic: Secondary | ICD-10-CM | POA: Diagnosis not present

## 2016-01-15 DIAGNOSIS — G8929 Other chronic pain: Secondary | ICD-10-CM | POA: Diagnosis not present

## 2016-01-15 DIAGNOSIS — M5136 Other intervertebral disc degeneration, lumbar region: Secondary | ICD-10-CM | POA: Diagnosis not present

## 2016-01-15 DIAGNOSIS — M5137 Other intervertebral disc degeneration, lumbosacral region: Secondary | ICD-10-CM | POA: Diagnosis not present

## 2016-01-15 DIAGNOSIS — Z87891 Personal history of nicotine dependence: Secondary | ICD-10-CM | POA: Diagnosis not present

## 2016-01-15 DIAGNOSIS — F329 Major depressive disorder, single episode, unspecified: Secondary | ICD-10-CM | POA: Diagnosis not present

## 2016-02-19 DIAGNOSIS — F339 Major depressive disorder, recurrent, unspecified: Secondary | ICD-10-CM | POA: Diagnosis not present

## 2016-02-19 DIAGNOSIS — M545 Low back pain: Secondary | ICD-10-CM | POA: Diagnosis not present

## 2016-02-19 DIAGNOSIS — Z720 Tobacco use: Secondary | ICD-10-CM | POA: Diagnosis not present

## 2016-02-19 DIAGNOSIS — J449 Chronic obstructive pulmonary disease, unspecified: Secondary | ICD-10-CM | POA: Diagnosis not present

## 2016-02-19 DIAGNOSIS — I480 Paroxysmal atrial fibrillation: Secondary | ICD-10-CM | POA: Diagnosis not present

## 2016-02-19 DIAGNOSIS — I4891 Unspecified atrial fibrillation: Secondary | ICD-10-CM | POA: Diagnosis not present

## 2016-02-19 DIAGNOSIS — K219 Gastro-esophageal reflux disease without esophagitis: Secondary | ICD-10-CM | POA: Diagnosis not present

## 2016-02-19 DIAGNOSIS — G8929 Other chronic pain: Secondary | ICD-10-CM | POA: Diagnosis not present

## 2016-02-19 DIAGNOSIS — E039 Hypothyroidism, unspecified: Secondary | ICD-10-CM | POA: Diagnosis not present

## 2016-02-19 DIAGNOSIS — K148 Other diseases of tongue: Secondary | ICD-10-CM | POA: Diagnosis not present

## 2016-02-20 DIAGNOSIS — I4891 Unspecified atrial fibrillation: Secondary | ICD-10-CM | POA: Diagnosis not present

## 2016-03-14 ENCOUNTER — Ambulatory Visit (INDEPENDENT_AMBULATORY_CARE_PROVIDER_SITE_OTHER): Payer: PPO | Admitting: Otolaryngology

## 2016-03-14 DIAGNOSIS — R070 Pain in throat: Secondary | ICD-10-CM | POA: Diagnosis not present

## 2016-03-14 DIAGNOSIS — F17210 Nicotine dependence, cigarettes, uncomplicated: Secondary | ICD-10-CM

## 2016-03-14 DIAGNOSIS — D3702 Neoplasm of uncertain behavior of tongue: Secondary | ICD-10-CM

## 2016-03-20 ENCOUNTER — Encounter (HOSPITAL_BASED_OUTPATIENT_CLINIC_OR_DEPARTMENT_OTHER): Payer: Self-pay | Admitting: *Deleted

## 2016-03-21 ENCOUNTER — Encounter (HOSPITAL_BASED_OUTPATIENT_CLINIC_OR_DEPARTMENT_OTHER): Payer: Self-pay | Admitting: *Deleted

## 2016-03-21 NOTE — Progress Notes (Signed)
Received records from Dr Quillian Quince and [facility] hospital regarding patients admission in January for A fib.  Echo, EKG and records reviewed by Dr Royce Macadamia, no additional clearance or testing needed for surgery with Dr Benjamine Mola 03/26/16.  Records kept with paper chart and sent to pre op.

## 2016-03-22 DIAGNOSIS — Z6829 Body mass index (BMI) 29.0-29.9, adult: Secondary | ICD-10-CM | POA: Diagnosis not present

## 2016-03-22 DIAGNOSIS — I480 Paroxysmal atrial fibrillation: Secondary | ICD-10-CM | POA: Diagnosis not present

## 2016-03-25 ENCOUNTER — Other Ambulatory Visit: Payer: Self-pay | Admitting: Otolaryngology

## 2016-03-26 ENCOUNTER — Encounter (HOSPITAL_BASED_OUTPATIENT_CLINIC_OR_DEPARTMENT_OTHER): Payer: Self-pay | Admitting: *Deleted

## 2016-03-26 ENCOUNTER — Ambulatory Visit (HOSPITAL_BASED_OUTPATIENT_CLINIC_OR_DEPARTMENT_OTHER): Payer: PPO | Admitting: Certified Registered"

## 2016-03-26 ENCOUNTER — Ambulatory Visit (HOSPITAL_BASED_OUTPATIENT_CLINIC_OR_DEPARTMENT_OTHER)
Admission: RE | Admit: 2016-03-26 | Discharge: 2016-03-26 | Disposition: A | Discharge status: Home / Self Care | Payer: PPO | Source: Ambulatory Visit | Attending: Otolaryngology | Admitting: Otolaryngology

## 2016-03-26 ENCOUNTER — Encounter (HOSPITAL_BASED_OUTPATIENT_CLINIC_OR_DEPARTMENT_OTHER)
Admission: RE | Disposition: A | Discharge status: Home / Self Care | Payer: Self-pay | Source: Ambulatory Visit | Attending: Otolaryngology

## 2016-03-26 DIAGNOSIS — D3705 Neoplasm of uncertain behavior of pharynx: Secondary | ICD-10-CM | POA: Diagnosis not present

## 2016-03-26 DIAGNOSIS — C01 Malignant neoplasm of base of tongue: Secondary | ICD-10-CM | POA: Diagnosis not present

## 2016-03-26 DIAGNOSIS — F419 Anxiety disorder, unspecified: Secondary | ICD-10-CM | POA: Diagnosis not present

## 2016-03-26 DIAGNOSIS — M1990 Unspecified osteoarthritis, unspecified site: Secondary | ICD-10-CM | POA: Diagnosis not present

## 2016-03-26 DIAGNOSIS — E079 Disorder of thyroid, unspecified: Secondary | ICD-10-CM | POA: Diagnosis not present

## 2016-03-26 DIAGNOSIS — F329 Major depressive disorder, single episode, unspecified: Secondary | ICD-10-CM | POA: Diagnosis not present

## 2016-03-26 DIAGNOSIS — K149 Disease of tongue, unspecified: Secondary | ICD-10-CM | POA: Diagnosis not present

## 2016-03-26 DIAGNOSIS — K148 Other diseases of tongue: Secondary | ICD-10-CM | POA: Diagnosis not present

## 2016-03-26 DIAGNOSIS — F17210 Nicotine dependence, cigarettes, uncomplicated: Secondary | ICD-10-CM | POA: Diagnosis not present

## 2016-03-26 DIAGNOSIS — Z85810 Personal history of malignant neoplasm of tongue: Secondary | ICD-10-CM | POA: Diagnosis not present

## 2016-03-26 DIAGNOSIS — K219 Gastro-esophageal reflux disease without esophagitis: Secondary | ICD-10-CM | POA: Diagnosis not present

## 2016-03-26 HISTORY — DX: Depression, unspecified: F32.A

## 2016-03-26 HISTORY — PX: DIRECT LARYNGOSCOPY: SHX5326

## 2016-03-26 HISTORY — PX: EXCISION OF TONGUE LESION: SHX6434

## 2016-03-26 HISTORY — DX: Major depressive disorder, single episode, unspecified: F32.9

## 2016-03-26 SURGERY — LARYNGOSCOPY, DIRECT
Anesthesia: General | Site: Throat | Laterality: Right

## 2016-03-26 MED ORDER — LACTATED RINGERS IV SOLN: 500.0000 mL | INTRAVENOUS | Status: DC

## 2016-03-26 MED ORDER — PROPOFOL 10 MG/ML IV BOLUS
INTRAVENOUS | Status: DC | PRN
  Administered 2016-03-26: 30 mg via INTRAVENOUS
  Administered 2016-03-26: 200 mg via INTRAVENOUS

## 2016-03-26 MED ORDER — FENTANYL CITRATE (PF) 100 MCG/2ML IJ SOLN
INTRAMUSCULAR | Status: AC
  Filled 2016-03-26: qty 2

## 2016-03-26 MED ORDER — ONDANSETRON HCL 4 MG/2ML IJ SOLN
INTRAMUSCULAR | Status: AC
  Filled 2016-03-26: qty 2

## 2016-03-26 MED ORDER — SUCCINYLCHOLINE CHLORIDE 20 MG/ML IJ SOLN
INTRAMUSCULAR | Status: DC | PRN
  Administered 2016-03-26: 100 mg via INTRAVENOUS

## 2016-03-26 MED ORDER — MIDAZOLAM HCL 2 MG/2ML IJ SOLN
INTRAMUSCULAR | Status: AC
  Filled 2016-03-26: qty 2

## 2016-03-26 MED ORDER — SUGAMMADEX SODIUM 200 MG/2ML IV SOLN
INTRAVENOUS | Status: AC
  Filled 2016-03-26: qty 2

## 2016-03-26 MED ORDER — LIDOCAINE 2% (20 MG/ML) 5 ML SYRINGE
INTRAMUSCULAR | Status: AC
  Filled 2016-03-26: qty 5

## 2016-03-26 MED ORDER — FENTANYL CITRATE (PF) 100 MCG/2ML IJ SOLN
50.0000 ug | INTRAMUSCULAR | Status: DC | PRN
  Administered 2016-03-26: 100 ug via INTRAVENOUS

## 2016-03-26 MED ORDER — SCOPOLAMINE 1 MG/3DAYS TD PT72: 1.0000 | MEDICATED_PATCH | Freq: Once | TRANSDERMAL | Status: DC | PRN

## 2016-03-26 MED ORDER — CEFAZOLIN SODIUM-DEXTROSE 2-3 GM-% IV SOLR
INTRAVENOUS | Status: DC | PRN
  Administered 2016-03-26: 2 g via INTRAVENOUS

## 2016-03-26 MED ORDER — ONDANSETRON HCL 4 MG/2ML IJ SOLN
INTRAMUSCULAR | Status: DC | PRN
  Administered 2016-03-26: 4 mg via INTRAVENOUS

## 2016-03-26 MED ORDER — MIDAZOLAM HCL 2 MG/2ML IJ SOLN
1.0000 mg | INTRAMUSCULAR | Status: DC | PRN
  Administered 2016-03-26: 2 mg via INTRAVENOUS

## 2016-03-26 MED ORDER — EPINEPHRINE 30 MG/30ML IJ SOLN
INTRAMUSCULAR | Status: AC
  Filled 2016-03-26: qty 1

## 2016-03-26 MED ORDER — SUCCINYLCHOLINE CHLORIDE 200 MG/10ML IV SOSY
PREFILLED_SYRINGE | INTRAVENOUS | Status: AC
  Filled 2016-03-26: qty 10

## 2016-03-26 MED ORDER — FENTANYL CITRATE (PF) 100 MCG/2ML IJ SOLN
25.0000 ug | INTRAMUSCULAR | Status: DC | PRN
  Administered 2016-03-26 (×2): 50 ug via INTRAVENOUS

## 2016-03-26 MED ORDER — CEFAZOLIN SODIUM 1 G IJ SOLR
INTRAMUSCULAR | Status: AC
  Filled 2016-03-26: qty 30

## 2016-03-26 MED ORDER — DEXAMETHASONE SODIUM PHOSPHATE 4 MG/ML IJ SOLN
INTRAMUSCULAR | Status: DC | PRN
  Administered 2016-03-26: 10 mg via INTRAVENOUS

## 2016-03-26 MED ORDER — DEXAMETHASONE SODIUM PHOSPHATE 10 MG/ML IJ SOLN
INTRAMUSCULAR | Status: AC
  Filled 2016-03-26: qty 1

## 2016-03-26 MED ORDER — AMOXICILLIN 875 MG PO TABS: 875.0000 mg | ORAL_TABLET | Freq: Two times a day (BID) | ORAL | 0 refills | Status: AC

## 2016-03-26 MED ORDER — PROMETHAZINE HCL 25 MG/ML IJ SOLN: 6.2500 mg | INTRAMUSCULAR | Status: DC | PRN

## 2016-03-26 MED ORDER — MEPERIDINE HCL 25 MG/ML IJ SOLN: 6.2500 mg | INTRAMUSCULAR | Status: DC | PRN

## 2016-03-26 MED ORDER — LIDOCAINE HCL (CARDIAC) 20 MG/ML IV SOLN
INTRAVENOUS | Status: DC | PRN
  Administered 2016-03-26: 100 mg via INTRAVENOUS

## 2016-03-26 MED ORDER — LACTATED RINGERS IV SOLN
INTRAVENOUS | Status: DC
  Administered 2016-03-26: 11:00:00 via INTRAVENOUS

## 2016-03-26 MED ORDER — EPINEPHRINE 1 MG/ML IJ SOLN
INTRAMUSCULAR | Status: DC | PRN
  Administered 2016-03-26: 1 mL via TOPICAL

## 2016-03-26 MED FILL — Epinephrine Inj 30 MG/30ML: INTRAMUSCULAR | Qty: 1 | Status: AC

## 2016-03-26 SURGICAL SUPPLY — 46 items
ADAPTER TUBE FLEX ULTRASET (MISCELLANEOUS) IMPLANT
BLADE SURG 15 STRL LF DISP TIS (BLADE) IMPLANT
BLADE SURG 15 STRL SS (BLADE)
CANISTER SUCT 1200ML W/VALVE (MISCELLANEOUS) ×3 IMPLANT
CONT SPEC 4OZ CLIKSEAL STRL BL (MISCELLANEOUS) IMPLANT
COVER MAYO STAND STRL (DRAPES) IMPLANT
DECANTER SPIKE VIAL GLASS SM (MISCELLANEOUS) IMPLANT
DEPRESSOR TONGUE BLADE STERILE (MISCELLANEOUS) IMPLANT
ELECT COATED BLADE 2.86 ST (ELECTRODE) IMPLANT
ELECT NEEDLE BLADE 2-5/6 (NEEDLE) IMPLANT
ELECT REM PT RETURN 9FT ADLT (ELECTROSURGICAL)
ELECT REM PT RETURN 9FT PED (ELECTROSURGICAL)
ELECTRODE REM PT RETRN 9FT PED (ELECTROSURGICAL) IMPLANT
ELECTRODE REM PT RTRN 9FT ADLT (ELECTROSURGICAL) IMPLANT
GLOVE BIO SURGEON STRL SZ7.5 (GLOVE) ×3 IMPLANT
GLOVE SURG SS PI 6.5 STRL IVOR (GLOVE) ×3 IMPLANT
GLOVE SURG SS PI 7.0 STRL IVOR (GLOVE) ×3 IMPLANT
GOWN STRL REUS W/ TWL LRG LVL3 (GOWN DISPOSABLE) ×4 IMPLANT
GOWN STRL REUS W/TWL LRG LVL3 (GOWN DISPOSABLE) ×4
GUARD TEETH (MISCELLANEOUS) IMPLANT
MARKER SKIN DUAL TIP RULER LAB (MISCELLANEOUS) ×3 IMPLANT
NEEDLE HYPO 18GX1.5 BLUNT FILL (NEEDLE) ×3 IMPLANT
NEEDLE PRECISIONGLIDE 27X1.5 (NEEDLE) IMPLANT
NEEDLE SPNL 22GX7 QUINCKE BK (NEEDLE) IMPLANT
NEEDLE SPNL 25GX3.5 QUINCKE BL (NEEDLE) IMPLANT
NS IRRIG 1000ML POUR BTL (IV SOLUTION) ×3 IMPLANT
PACK BASIN DAY SURGERY FS (CUSTOM PROCEDURE TRAY) IMPLANT
PATTIES SURGICAL .5 X3 (DISPOSABLE) ×3 IMPLANT
PENCIL BUTTON HOLSTER BLD 10FT (ELECTRODE) IMPLANT
SHEET MEDIUM DRAPE 40X70 STRL (DRAPES) ×3 IMPLANT
SLEEVE SCD COMPRESS KNEE MED (MISCELLANEOUS) IMPLANT
SOLUTION BUTLER CLEAR DIP (MISCELLANEOUS) ×3 IMPLANT
SPONGE GAUZE 4X4 12PLY STER LF (GAUZE/BANDAGES/DRESSINGS) ×6 IMPLANT
SUCTION FRAZIER HANDLE 10FR (MISCELLANEOUS)
SUCTION TUBE FRAZIER 10FR DISP (MISCELLANEOUS) IMPLANT
SURGILUBE 2OZ TUBE FLIPTOP (MISCELLANEOUS) IMPLANT
SUT CHROMIC 5 0 RB 1 27 (SUTURE) IMPLANT
SUT SILK 3 0 TIES 17X18 (SUTURE)
SUT SILK 3-0 18XBRD TIE BLK (SUTURE) IMPLANT
SUT VIC AB 4-0 RB1 27 (SUTURE)
SUT VIC AB 4-0 RB1 27X BRD (SUTURE) IMPLANT
SYR CONTROL 10ML LL (SYRINGE) ×3 IMPLANT
SYR TB 1ML LL NO SAFETY (SYRINGE) ×3 IMPLANT
TOWEL OR 17X24 6PK STRL BLUE (TOWEL DISPOSABLE) ×3 IMPLANT
TUBE CONNECTING 20X1/4 (TUBING) ×3 IMPLANT
YANKAUER SUCT BULB TIP NO VENT (SUCTIONS) IMPLANT

## 2016-03-26 NOTE — Anesthesia Postprocedure Evaluation (Signed)
Anesthesia Post Note  Patient: Darrell White  Procedure(s) Performed: Procedure(s) (LRB): DIRECT LARYNGOSCOPY (N/A) BIOPSY OF RIGHT TONGUE BASE MASS (Right)  Patient location during evaluation: PACU Anesthesia Type: General Level of consciousness: awake and alert Pain management: pain level controlled Vital Signs Assessment: post-procedure vital signs reviewed and stable Respiratory status: spontaneous breathing, nonlabored ventilation and respiratory function stable Cardiovascular status: blood pressure returned to baseline and stable Postop Assessment: no signs of nausea or vomiting Anesthetic complications: no       Last Vitals:  Vitals:   03/26/16 1245 03/26/16 1300  BP: 136/71 (!) 141/64  Pulse: (!) 59 61  Resp: 12 12  Temp:      Last Pain:  Vitals:   03/26/16 1300  TempSrc:   PainSc: 10-Worst pain ever                 Tammera Engert A.

## 2016-03-26 NOTE — Anesthesia Procedure Notes (Signed)
Procedure Name: Intubation Date/Time: 03/26/2016 11:57 AM Performed by: Lieutenant Diego Pre-anesthesia Checklist: Patient identified, Emergency Drugs available, Suction available and Patient being monitored Patient Re-evaluated:Patient Re-evaluated prior to inductionOxygen Delivery Method: Circle system utilized Preoxygenation: Pre-oxygenation with 100% oxygen Intubation Type: IV induction Ventilation: Mask ventilation without difficulty Laryngoscope Size: Miller and 2 Grade View: Grade I Tube type: Oral Tube size: 7.0 mm Number of attempts: 1 Airway Equipment and Method: Stylet and Oral airway Placement Confirmation: ETT inserted through vocal cords under direct vision,  positive ETCO2 and breath sounds checked- equal and bilateral Secured at: 22 cm Tube secured with: Tape Dental Injury: Teeth and Oropharynx as per pre-operative assessment

## 2016-03-26 NOTE — Transfer of Care (Signed)
Immediate Anesthesia Transfer of Care Note  Patient: Darrell White  Procedure(s) Performed: Procedure(s): DIRECT LARYNGOSCOPY (N/A) BIOPSY OF RIGHT TONGUE BASE MASS (Right)  Patient Location: PACU  Anesthesia Type:General  Level of Consciousness: awake and alert   Airway & Oxygen Therapy: Patient Spontanous Breathing and Patient connected to face mask oxygen  Post-op Assessment: Report given to RN and Post -op Vital signs reviewed and stable  Post vital signs: Reviewed and stable  Last Vitals:  Vitals:   03/26/16 1041  BP: 103/62  Pulse: (!) 55  Resp: 18  Temp: 36.8 C    Last Pain:  Vitals:   03/26/16 1041  TempSrc: Oral  PainSc: 10-Worst pain ever      Patients Stated Pain Goal: 5 (12/01/23 3664)  Complications: No apparent anesthesia complications

## 2016-03-26 NOTE — Discharge Instructions (Addendum)
°  Post Anesthesia Home Care Instructions  Activity: Get plenty of rest for the remainder of the day. A responsible adult should stay with you for 24 hours following the procedure.  For the next 24 hours, DO NOT: -Drive a car -Paediatric nurse -Drink alcoholic beverages -Take any medication unless instructed by your physician -Make any legal decisions or sign important papers.  Meals: Start with liquid foods such as gelatin or soup. Progress to regular foods as tolerated. Avoid greasy, spicy, heavy foods. If nausea and/or vomiting occur, drink only clear liquids until the nausea and/or vomiting subsides. Call your physician if vomiting continues.  Special Instructions/Symptoms: Your throat may feel dry or sore from the anesthesia or the breathing tube placed in your throat during surgery. If this causes discomfort, gargle with warm salt water. The discomfort should disappear within 24 hours.  If you had a scopolamine patch placed behind your ear for the management of post- operative nausea and/or vomiting:  1. The medication in the patch is effective for 72 hours, after which it should be removed.  Wrap patch in a tissue and discard in the trash. Wash hands thoroughly with soap and water. 2. You may remove the patch earlier than 72 hours if you experience unpleasant side effects which may include dry mouth, dizziness or visual disturbances. 3. Avoid touching the patch. Wash your hands with soap and water after contact with the patch.   The patient may resume all his previous activities and diet. He will follow-up in my [place] office in one week.

## 2016-03-26 NOTE — Anesthesia Preprocedure Evaluation (Signed)
Anesthesia Evaluation  Patient identified by MRN, date of birth, ID band Patient awake    Reviewed: Allergy & Precautions, NPO status , Patient's Chart, lab work & pertinent test results  Airway Mallampati: II  TM Distance: >3 FB Neck ROM: Full    Dental  (+) Edentulous Upper, Edentulous Lower   Pulmonary Current Smoker,    Pulmonary exam normal breath sounds clear to auscultation       Cardiovascular negative cardio ROS Normal cardiovascular exam Rhythm:Regular Rate:Normal     Neuro/Psych PSYCHIATRIC DISORDERS Anxiety Depression HOH    GI/Hepatic Neg liver ROS, GERD  Controlled and Medicated,Mass Base of Tongue- Right side   Endo/Other  negative endocrine ROS  Renal/GU negative Renal ROS  negative genitourinary   Musculoskeletal  (+) Arthritis , Osteoarthritis,    Abdominal (+) + obese,   Peds  Hematology negative hematology ROS (+)   Anesthesia Other Findings   Reproductive/Obstetrics                             Anesthesia Physical Anesthesia Plan  ASA: II  Anesthesia Plan: General   Post-op Pain Management:    Induction: Intravenous  Airway Management Planned: Oral ETT  Additional Equipment:   Intra-op Plan:   Post-operative Plan: Extubation in OR  Informed Consent: I have reviewed the patients History and Physical, chart, labs and discussed the procedure including the risks, benefits and alternatives for the proposed anesthesia with the patient or authorized representative who has indicated his/her understanding and acceptance.   Dental advisory given  Plan Discussed with: Anesthesiologist, CRNA and Surgeon  Anesthesia Plan Comments:         Anesthesia Quick Evaluation

## 2016-03-26 NOTE — Op Note (Signed)
DATE OF PROCEDURE:  03/26/2016                              OPERATIVE REPORT  SURGEON:  Leta Baptist, MD  PREOPERATIVE DIAGNOSES: 1. Right base of tongue mass  POSTOPERATIVE DIAGNOSES: 1. Right base of tongue mass  PROCEDURE PERFORMED:  Direct laryngoscopy with biopsy of right base of tongue mass  ANESTHESIA:  General endotracheal tube anesthesia.  COMPLICATIONS:  None.  ESTIMATED BLOOD LOSS:  Minimal.  INDICATION FOR PROCEDURE:  Darrell White is a 66 y.o. male with a 6-monthhistory of burning sensation at the base of his tongue and his throat. His mouth and throat pain has progressively worsened. The patient smokes 2 pack per day of cigarettes for 50+ years. On examination, an ulcerative lesion was noted at the right tongue base. 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 Based on the above findings, the decision was made for the patient to undergo the direct laryngoscopy and biopsy procedure.   The risks, benefits, alternatives, and details of the procedure were discussed with the patient.  Questions were invited and answered.  Informed consent was obtained.  DESCRIPTION:  The patient was taken to the operating room and placed supine on the operating table.  General endotracheal tube anesthesia was administered by the anesthesiologist.  The patient was positioned and prepped and draped in a standard fashion for direct laryngoscopy.  A Dedo laryngoscope was used for the procedure. The laryngoscope was inserted via the oral cavity into the pharynx. Examination of the vallecula showed an ulcerated lesion at the right base of tongue. The lesion does not involve the epiglottis or the larynx. The piriform sinuses, epiglottis, aryepiglottic folds, arytenoids, and vocal cords were all normal.  A large cup forceps was then inserted through the laryngoscope to make multiple large biopsy specimens from the lesion. The biopsy specimens were sent to the pathology department for permanent  histologic identification. Hemostasis was achieved with pledgets soaked with epinephrine  The care of the patient was turned over to the anesthesiologist.  The patient was awakened from anesthesia without difficulty.  The patient was extubated and transferred to the recovery room in good condition.  OPERATIVE FINDINGS:  An ulcerated lesion is noted at the right base of tongue.  SPECIMEN:  Right base of tongue biopsy specimens  FOLLOWUP CARE:  The patient will be discharged home once awake and alert.  He will be placed on amoxicillin 875 mg p.o. b.i.d. for 5 days, and percocet prn pain.  The patient will follow up in my office in approximately 1 week.  [name]2/20/2018 12:17 PM

## 2016-03-26 NOTE — H&P (Signed)
Cc: Tongue mass  HPI: The patient is a 66 y/o male who presents today for evaluation of burning sensation at the base of his tongue and his throat. The patient is seen in consultation requested by Dr. Gar Ponto. The patient noted onset of symptoms over 3 months ago. The patient has noted progressive mouth and throat pain. He states he gets choked frequently. The patient has a history of both tongue and lip cancer. He underwent surgery and radiation therapy for his tongue cancer 10 years ago. The patient denies any unexplained weight loss. He continues to smoke 2 ppd and has been doing so for 50+ years.   The patient's review of systems (constitutional, eyes, ENT, cardiovascular, respiratory, GI, musculoskeletal, skin, neurologic, psychiatric, endocrine, hematologic, allergic) is noted in the ROS questionnaire.  It is reviewed with the patient.   Family health history: None.  Major events: bilateral  shoulder surgery,right  knee arthroscopy, back surgery X7.  Ongoing medical problems:  Thyroid disease.  Social history: The patient is married. He smokes two pack of cigarettes a day. he denies the use of alcohol or illegal drugs.  Exam General: Communicates without difficulty, well nourished, no acute distress. Head: Normocephalic, no evidence injury, no tenderness, facial buttresses intact without stepoff. Eyes: PERRL, EOMI.  No scleral icterus, conjunctivae clear. Ears: External auditory canals clear bilaterally.  There is no edema or erythema.  Tympanic membrane is within normal limits bilaterally. Nose: Normal skin and external support.  Anterior rhinoscopy reveals healthy pink mucosa over the septum and turbinates.  No lesions or polyps were seen. Oral cavity: Lips without lesions. Ulcerative mass is noted at the right tongue base anterior to the tonsillar pillar. Indirect  mirror laryngoscopy could not be tolerated. Pharynx: Clear, no erythema. Neck: Supple, full range of motion, no  lymphadenopathy, no masses palpable. Salivary: Parotid and submandibular glands without mass. Neuro:  CN 2-12 grossly intact. Gait normal. Vestibular: No nystagmus at any point of gaze.   Procedure:  Flexible Fiberoptic Laryngoscopy -- Risks, benefits, and alternatives of flexible endoscopy were explained to the patient.  Specific mention was made of the risk of throat numbness with difficulty swallowing, possible bleeding from the nose and mouth, and pain from the procedure.  The patient gave oral consent to proceed.  The nasal cavities were decongested and anesthetised with a combination of oxymetazoline and 4% lidocaine solution.  The flexible scope was inserted into the right nasal cavity and advanced towards the nasopharynx.  Visualized mucosa over the turbinates and septum were normal.  The nasopharynx was clear.   Hypopharynx was also without  lesion or edema.  Larynx was mobile without lesions.  No lesions or asymmetry in the supraglottic larynx.  Arytenoid mucosa was edematous with slight erythema.  True vocal folds were pale yellow and edematous but without mass or lesion.  Base of tongue with ulcerative mass on the right side.  The patient tolerated the procedure well.   Assessment 1.  Ulcerative lesion at right tongue base. 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  2.  No other suspicious mass or lesion is noted on today's fiberoptic laryngoscopy exam. 3.  Tobacco dependence.  Plan 1.  The physical exam and laryngoscopy findings are reviewed with the patient. All questions and concerns are addressed.  2.  Recommend DL with biopsy for further diagnosis. 3.  Tobacco cessation is discussed and strongly encouraged. 4.  The biopsy will be scheduled in accordance to the patient's schedule.

## 2016-03-27 ENCOUNTER — Encounter (HOSPITAL_BASED_OUTPATIENT_CLINIC_OR_DEPARTMENT_OTHER): Payer: Self-pay | Admitting: Otolaryngology

## 2016-04-01 ENCOUNTER — Ambulatory Visit (INDEPENDENT_AMBULATORY_CARE_PROVIDER_SITE_OTHER): Payer: PPO | Admitting: Otolaryngology

## 2016-04-01 DIAGNOSIS — C140 Malignant neoplasm of pharynx, unspecified: Secondary | ICD-10-CM | POA: Diagnosis not present

## 2016-04-01 DIAGNOSIS — F17210 Nicotine dependence, cigarettes, uncomplicated: Secondary | ICD-10-CM | POA: Diagnosis not present

## 2016-04-02 ENCOUNTER — Encounter (HOSPITAL_COMMUNITY): Payer: PPO | Attending: Oncology | Admitting: Oncology

## 2016-04-02 ENCOUNTER — Encounter (HOSPITAL_COMMUNITY): Payer: Self-pay | Admitting: Oncology

## 2016-04-02 ENCOUNTER — Encounter (HOSPITAL_COMMUNITY): Payer: PPO

## 2016-04-02 DIAGNOSIS — R634 Abnormal weight loss: Secondary | ICD-10-CM

## 2016-04-02 DIAGNOSIS — C029 Malignant neoplasm of tongue, unspecified: Secondary | ICD-10-CM | POA: Diagnosis not present

## 2016-04-02 DIAGNOSIS — Z720 Tobacco use: Secondary | ICD-10-CM

## 2016-04-02 DIAGNOSIS — Z808 Family history of malignant neoplasm of other organs or systems: Secondary | ICD-10-CM | POA: Diagnosis not present

## 2016-04-02 DIAGNOSIS — J029 Acute pharyngitis, unspecified: Secondary | ICD-10-CM

## 2016-04-02 DIAGNOSIS — Z85810 Personal history of malignant neoplasm of tongue: Secondary | ICD-10-CM

## 2016-04-02 DIAGNOSIS — Z8042 Family history of malignant neoplasm of prostate: Secondary | ICD-10-CM | POA: Diagnosis not present

## 2016-04-02 HISTORY — DX: Malignant neoplasm of tongue, unspecified: C02.9

## 2016-04-02 LAB — CBC WITH DIFFERENTIAL/PLATELET
BASOS ABS: 0 10*3/uL (ref 0.0–0.1)
Basophils Relative: 1 %
EOS ABS: 0.2 10*3/uL (ref 0.0–0.7)
EOS PCT: 2 %
HCT: 41.2 % (ref 39.0–52.0)
Hemoglobin: 13.7 g/dL (ref 13.0–17.0)
Lymphocytes Relative: 21 %
Lymphs Abs: 1.3 10*3/uL (ref 0.7–4.0)
MCH: 30.3 pg (ref 26.0–34.0)
MCHC: 33.3 g/dL (ref 30.0–36.0)
MCV: 91.2 fL (ref 78.0–100.0)
MONO ABS: 0.8 10*3/uL (ref 0.1–1.0)
Monocytes Relative: 13 %
Neutro Abs: 4 10*3/uL (ref 1.7–7.7)
Neutrophils Relative %: 63 %
PLATELETS: 189 10*3/uL (ref 150–400)
RBC: 4.52 MIL/uL (ref 4.22–5.81)
RDW: 13.5 % (ref 11.5–15.5)
WBC: 6.3 10*3/uL (ref 4.0–10.5)

## 2016-04-02 LAB — COMPREHENSIVE METABOLIC PANEL
ALT: 10 U/L — AB (ref 17–63)
AST: 17 U/L (ref 15–41)
Albumin: 4.3 g/dL (ref 3.5–5.0)
Alkaline Phosphatase: 39 U/L (ref 38–126)
Anion gap: 6 (ref 5–15)
BUN: 13 mg/dL (ref 6–20)
CHLORIDE: 101 mmol/L (ref 101–111)
CO2: 30 mmol/L (ref 22–32)
CREATININE: 0.92 mg/dL (ref 0.61–1.24)
Calcium: 9.3 mg/dL (ref 8.9–10.3)
GFR calc non Af Amer: >60 mL/min (ref >60)
Glucose, Bld: 95 mg/dL (ref 65–99)
POTASSIUM: 4.8 mmol/L (ref 3.5–5.1)
SODIUM: 137 mmol/L (ref 135–145)
Total Bilirubin: 0.4 mg/dL (ref 0.3–1.2)
Total Protein: 7.2 g/dL (ref 6.5–8.1)

## 2016-04-02 NOTE — Progress Notes (Signed)
Met with pt face to face.  Introduced myself and explain a little bit about my role as the patient navigator.  Pt given a card with all my information on it.  Told pt to call if they had any questions or concerns.  Pt verbalized understanding.

## 2016-04-02 NOTE — Progress Notes (Signed)
[facility] Hematology/Oncology Consultation   Name: Darrell White      MRN: [id number]    Location: Room/bed info not found  Date: 04/02/2016 Time:4:42 PM   REFERRING PHYSICIAN:  Dr. 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  HISTORY OF PRESENT ILLNESS:   Darrell White is a 66 y.o. 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    Squamous cell carcinoma of tongue ([place])   03/14/2016 Miscellaneous    Dr. Benjamine Mola (ENT) consult      03/14/2016 Procedure    Flex laryngoscopy-base of tongue with ulcerative mass in the right side.      03/26/2016 Procedure    Biopsy of right tongue by  Dr. Benjamine Mola      03/27/2016 Pathology Results    Invasive squamous cell carcinoma.      04/02/2016 Miscellaneous    Pathology contacted and requested HPV testing on malignancy specimen.      Patient reports that he presented to his primary care physician with a persistent sore throat.  When seen by his primary care physician, the patient was noted to be an arrhythmia and therefore he was admitted to [facility] for 2-3 days.  He denies any cardioversion.  He notes that he is medically managed and had a heart monitor for 2-3 days.  He reports that upon presentation to the hospital, his heart rate was within normal limits and his rhythm was normal.  He reports a history of a left-sided tongue/lip cancer treated with resection 10+ years ago.  He recalls undergoing radiation therapy as well.  He denies any chemotherapy.  He reports an ongoing sore throat which is inhibiting his ability to swallow.  As  result, his desire to eat has declined.  He notes "it hurts to swallow and food gets hung up."  He denies any issues with liquids.  He is able to eat soft foods.  He does admit to decrease in weight.  Today, he weighs in at 201.1 pounds.  He recalls weighing in at 206 pounds a day of his biopsy.  He notes that 6 months ago his weight was approximately 235 pounds which is where he typically is.  Review of Systems  Constitutional: Positive for weight loss. Negative for chills, fever and malaise/fatigue.  HENT: Positive for sore throat.   Respiratory: Negative.  Negative for cough, hemoptysis and shortness of breath.   Cardiovascular: Negative.  Negative for chest pain.  Gastrointestinal: Negative.  Negative for constipation, diarrhea, nausea and vomiting.  Genitourinary: Negative.  Negative for dysuria.  Musculoskeletal: Negative.   Skin: Negative.   Neurological: Negative.   Endo/Heme/Allergies: Negative.   Psychiatric/Behavioral: Negative.      PAST MEDICAL HISTORY:   Past Medical History:  Diagnosis Date  . Anxiety   . Arthritis   . Cancer ([place])    of mouth  . Depression   . GERD (gastroesophageal reflux disease)   . HOH (hard of hearing)   . Hypercholesteremia   . Hypertension   . Hypothyroid   . Neuropathy ([place])   . Squamous cell carcinoma of tongue (HCC)  04/02/2016    ALLERGIES: No Known Allergies    MEDICATIONS: I have reviewed the patient's current medications.    Current Outpatient Prescriptions on File Prior to Visit  Medication Sig Dispense Refill  . levothyroxine (SYNTHROID, LEVOTHROID) 75 MCG tablet Take 75 mcg by mouth daily before breakfast.    . metoprolol succinate (TOPROL-XL) 25 MG 24 hr tablet Take 25 mg by mouth at bedtime.    . traZODone (DESYREL) 50 MG tablet Take 150 mg by mouth at bedtime.     No current facility-administered medications on file prior to visit.      PAST SURGICAL HISTORY Past Surgical History:  Procedure Laterality Date  . BACK  SURGERY     had total of 7 surgeries with rods and plates  . CATARACT EXTRACTION W/PHACO Left 04/09/2012   Procedure: CATARACT EXTRACTION PHACO AND INTRAOCULAR LENS PLACEMENT (IOC);  Surgeon: Tonny Branch, MD;  Location: [facility];  Service: Ophthalmology;  Laterality: Left;  CDE: 12.44  . CERVICAL DISC SURGERY    . DIRECT LARYNGOSCOPY N/A 03/26/2016   Procedure: DIRECT LARYNGOSCOPY;  Surgeon: Leta Baptist, MD;  Location: [place];  Service: ENT;  Laterality: N/A;  . EXCISION OF TONGUE LESION Right 03/26/2016   Procedure: BIOPSY OF RIGHT TONGUE BASE MASS;  Surgeon: Leta Baptist, MD;  Location: [place];  Service: ENT;  Laterality: Right;  . KNEE ARTHROSCOPY     right knee  . MOUTH SURGERY     removal of cancer  . OTHER SURGICAL HISTORY     insertion of pain pump  . SHOULDER ARTHROSCOPY     bilateral  . SPINAL FUSION    . SPINE SURGERY     insertion of morphine pump into spine    FAMILY HISTORY: Family History  Problem Relation Age of Onset  . Cancer Father   . Cancer Brother     Prostate cancer  Mother is alive at the age of 56 in good health. Father's deceased at the age of 58 secondary to prostate cancer/throat cancer.. 3 children: 77 year old son-healthy 39 year old daughter-healthy 20 year old son-healthy He has 11 grandchildren, all healthy. Brothers alive at age 34 with history of prostate cancer.  SOCIAL HISTORY:  reports that he has been smoking Cigarettes.  He has a 165.00 pack-year smoking history. He has never used smokeless tobacco. He reports that he does not drink alcohol or use drugs.  Reports a history of 3 packs per day 55 years of smoking.  Most recently, it is down significantly.  Notes a history of heavy alcoholism waiting approximately 15-20 years ago.  He favored bourbon and beer.  He is currently on disability secondary to back surgery 7.  Used to work in [employer].  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  Social History    Social History  . Marital status: Married    Spouse name: N/A  . Number of children: N/A  . Years of education: N/A   Social History Main Topics  . Smoking status: Current Every Day Smoker    Packs/day: 3.00    Years: 55.00    Types: Cigarettes  . Smokeless tobacco: Never Used  . Alcohol use No     Comment: H/O EtOHism drinking heavy- boubon/beer, quit ~ 2000  . Drug use: No  . Sexual activity: Yes    Birth control/ protection: None   Other Topics Concern  . None   Social History Narrative  . None    PERFORMANCE STATUS: The  patient's performance status is 1 - Symptomatic but completely ambulatory  PHYSICAL EXAM: Most Recent Vital Signs: Blood pressure (!) 133/56, pulse (!) 52, temperature 97.6 F (36.4 C), temperature source Oral, resp. rate 18, height '5\' 8"'$  (1.727 m), weight 201 lb 1.6 oz (91.2 kg), SpO2 99 %. BP (!) 133/56 (BP Location: Right Arm, Patient Position: Sitting)   Pulse (!) 52   Temp 97.6 F (36.4 C) (Oral)   Resp 18   Ht '5\' 8"'$  (1.727 m)   Wt 201 lb 1.6 oz (91.2 kg)   SpO2 99%   BMI 30.58 kg/m   General Appearance:    Alert, cooperative, no distress, appears older than stated age, chronically ill-appearing.  Head:    Normocephalic, without obvious abnormality, atraumatic  Eyes:    PERRL, conjunctiva/corneas clear, EOM's intact, fundi    benign, both eyes       Ears:    Normal TM's and external ear canals, both ears  Nose:   Nares normal, septum midline, mucosa normal, no drainage    or sinus tenderness  Throat:   Lips, mucosa, and tongue normal; edentulous  Neck:   Supple, symmetrical, trachea midline, no adenopathy; tenderness to palpation of right angle of jaw.  Back:     Symmetric, no curvature, ROM normal, no CVA tenderness  Lungs:    Respiratory wheezing in the left upper lobe, decreased breath sounds in the right lower lobe, otherwise clear to auscultation.  Chest wall:    No tenderness or deformity  Heart:    Regular rate and rhythm, S1  and S2 normal, no murmur, rub   or gallop.  Distant heart sounds.  Abdomen:     Soft, non-tender, bowel sounds active all four quadrants,    no masses, no organomegaly  Genitalia:    Not examined  Rectal:    Not examined  Extremities:   Extremities normal, atraumatic, no cyanosis or edema  Pulses:   2+ and symmetric all extremities  Skin:   Skin color, texture, turgor normal, no rashes or lesions  Lymph nodes:   Cervical, supraclavicular, and axillary nodes normal  Neurologic:   CNII-XII intact. Normal strength, sensation and reflexes      throughout    LABORATORY DATA:  Results for orders placed or performed in visit on 04/02/16 (from the past 48 hour(s))  CBC with Differential     Status: None   Collection Time: 04/02/16  3:54 PM  Result Value Ref Range   WBC 6.3 4.0 - 10.5 K/uL   RBC 4.52 4.22 - 5.81 MIL/uL   Hemoglobin 13.7 13.0 - 17.0 g/dL   HCT 41.2 39.0 - 52.0 %   MCV 91.2 78.0 - 100.0 fL   MCH 30.3 26.0 - 34.0 pg   MCHC 33.3 30.0 - 36.0 g/dL   RDW 13.5 11.5 - 15.5 %   Platelets 189 150 - 400 K/uL   Neutrophils Relative % 63 %   Neutro Abs 4.0 1.7 - 7.7 K/uL   Lymphocytes Relative 21 %   Lymphs Abs 1.3 0.7 - 4.0 K/uL   Monocytes Relative 13 %   Monocytes Absolute 0.8 0.1 - 1.0 K/uL   Eosinophils Relative 2 %   Eosinophils Absolute 0.2 0.0 - 0.7 K/uL   Basophils Relative 1 %   Basophils Absolute 0.0 0.0 - 0.1 K/uL      RADIOGRAPHY: No results found.     PATHOLOGY:    Diagnosis Tongue, biopsy, Right - INVASIVE SQUAMOUS CELL CARCINOMA.  ASSESSMENT/PLAN:   Squamous cell carcinoma of tongue (HCC) Invasive squamous cell carcinoma of right tongue, status post biopsy by Dr. Benjamine Mola on 03/26/2016.  Oncology history developed.  Staging cannot be performed due to incomplete data at this time.  I personally reviewed and went over pathology results with the patient.  Pathology confirms invasive squamous cell carcinoma.  I have contacted Maudie Mercury, pathology, and  requested HPV testing on malignancy specimen: [id number].  Order placed for CT neck and CT chest for staging purposes.  If indicated, we will further pursue PET imaging.  When he returns, we will be able to clinically stage him based upon aforementioned CT imaging and provide him further medical oncology recommendations.  Based upon stage and recommended treatment, he will need the following referrals: Dr. Enrique Sack, oncology dentistry, for examination. Speech pathology Nutrition Radiation oncology consultation Consideration of PEG tube placement  Patient is given patient education regarding head and neck cancer.    Labs today: CBC diff, CMET.  Return immediately following staging scans for medical oncology recommendations and referrals accordingly.   ORDERS PLACED FOR THIS ENCOUNTER: Orders Placed This Encounter  Procedures  . CT Chest W Contrast  . CT SOFT TISSUE NECK W CONTRAST  . CBC with Differential  . Comprehensive metabolic panel    MEDICATIONS PRESCRIBED THIS ENCOUNTER: Meds ordered this encounter  Medications  . gabapentin (NEURONTIN) 300 MG capsule    Sig: Take 300 mg by mouth 3 (three) times daily.  Marland Kitchen oxyCODONE (OXYCONTIN) 15 mg 12 hr tablet    Sig: Take 30 mg by mouth 2 (two) times daily.  Marland Kitchen atorvastatin (LIPITOR) 20 MG tablet    Sig: Take 20 mg by mouth at bedtime.    All questions were answered. The patient knows to call the clinic with any problems, questions or concerns. We can certainly see the patient much sooner if necessary.  Patient discussed with Dr. Talbert Cage and together we ascertained an up-to-date interval history, and examined the patient.  Dr. Talbert Cage developed the patient's assessment and plan.  This was a shared visit-consultation.  Her attestation will follow below.  This note is electronically signed by: Doy Mince 04/02/2016 4:42 PM

## 2016-04-02 NOTE — Patient Instructions (Signed)
[name] at [facility] Discharge Instructions  RECOMMENDATIONS MADE BY THE CONSULTANT AND ANY TEST RESULTS WILL BE SENT TO YOUR REFERRING PHYSICIAN.  You were seen today by Darrell White You will have lab work today CT scan within the next week Follow up after for results See [name] up front for appointments   Thank you for choosing [place] at [facility] to provide your oncology and hematology care.  To afford each patient quality time with our provider, please arrive at least 15 minutes before your scheduled appointment time.    If you have a lab appointment with the [facility] [place] please come in thru the  Main Entrance and check in at the main information desk  You need to re-schedule your appointment should you arrive 10 or more minutes late.  We strive to give you quality time with our providers, and arriving late affects you and other patients whose appointments are after yours.  Also, if you no show three or more times for appointments you may be dismissed from the clinic at the providers discretion.     Again, thank you for choosing [facility].  Our hope is that these requests will decrease the amount of time that you wait before being seen by our physicians.       _____________________________________________________________  Should you have questions after your visit to [name], please contact our office at [phone] between the hours of 8:30 a.m. and 4:30 p.m.  Voicemails left after 4:30 p.m. will not be returned until the following business day.  For prescription refill requests, have your pharmacy contact our office.       Resources For Cancer Patients and their Caregivers ? American Cancer Society: Can assist with transportation, wigs, general needs, runs Look Good Feel Better.        947-703-6533 ? Cancer Care: Provides financial assistance, online support groups, medication/co-pay  assistance.  1-800-813-HOPE (463) 424-8757) ? [place] Assists [place] cancer patients and their families through emotional , educational and financial support.  531-393-8432 ? [place] DSS Where to apply for food stamps, Medicaid and utility assistance. 813-511-2370 ? [facility]: Transportation to medical appointments. (971)513-3098 ? Social Security Administration: May apply for disability if have a Stage IV cancer. 607-367-5654 321 671 8597 ? [employer], Disability and Transit Services: Assists with nutrition, care and transit needs. [place] Support Programs: '@10RELATIVEDAYS'$ @ > Cancer Support Group  2nd Tuesday of the month 1pm-2pm, Journey Room  > Creative Journey  3rd Tuesday of the month 1130am-1pm, Journey Room  > Look Good Feel Better  1st Wednesday of the month 10am-12 noon, Journey Room (Call [place] to register [phone])

## 2016-04-02 NOTE — Assessment & Plan Note (Addendum)
Invasive squamous cell carcinoma of right tongue, status post biopsy by Dr. Benjamine Mola on 03/26/2016.  Oncology history developed.  Staging cannot be performed due to incomplete data at this time.  I personally reviewed and went over pathology results with the patient.  Pathology confirms invasive squamous cell carcinoma.  I have contacted Maudie Mercury, pathology, and requested HPV testing on malignancy specimen: [id number].  Order placed for CT neck and CT chest for staging purposes.  If indicated, we will further pursue PET imaging.  When he returns, we will be able to clinically stage him based upon aforementioned CT imaging and provide him further medical oncology recommendations.  Based upon stage and recommended treatment, he will need the following referrals: Dr. Enrique Sack, oncology dentistry, for examination. Speech pathology Nutrition Radiation oncology consultation Consideration of PEG tube placement  Patient is given patient education regarding head and neck cancer.    Labs today: CBC diff, CMET.  Return immediately following staging scans for medical oncology recommendations and referrals accordingly.

## 2016-04-09 ENCOUNTER — Ambulatory Visit (HOSPITAL_COMMUNITY)
Admission: RE | Admit: 2016-04-09 | Discharge: 2016-04-09 | Disposition: A | Discharge status: Home / Self Care | Payer: PPO | Source: Ambulatory Visit | Attending: Oncology | Admitting: Oncology

## 2016-04-09 DIAGNOSIS — C029 Malignant neoplasm of tongue, unspecified: Secondary | ICD-10-CM | POA: Insufficient documentation

## 2016-04-09 DIAGNOSIS — J432 Centrilobular emphysema: Secondary | ICD-10-CM | POA: Diagnosis not present

## 2016-04-09 DIAGNOSIS — I6522 Occlusion and stenosis of left carotid artery: Secondary | ICD-10-CM | POA: Diagnosis not present

## 2016-04-09 DIAGNOSIS — R911 Solitary pulmonary nodule: Secondary | ICD-10-CM | POA: Insufficient documentation

## 2016-04-09 DIAGNOSIS — I2510 Atherosclerotic heart disease of native coronary artery without angina pectoris: Secondary | ICD-10-CM | POA: Insufficient documentation

## 2016-04-09 DIAGNOSIS — J8410 Pulmonary fibrosis, unspecified: Secondary | ICD-10-CM | POA: Diagnosis not present

## 2016-04-09 DIAGNOSIS — R590 Localized enlarged lymph nodes: Secondary | ICD-10-CM | POA: Insufficient documentation

## 2016-04-09 DIAGNOSIS — I700 Atherosclerosis of aorta: Secondary | ICD-10-CM | POA: Diagnosis not present

## 2016-04-09 DIAGNOSIS — K148 Other diseases of tongue: Secondary | ICD-10-CM | POA: Diagnosis not present

## 2016-04-09 MED ORDER — IOPAMIDOL (ISOVUE-300) INJECTION 61%
100.0000 mL | Freq: Once | INTRAVENOUS | Status: AC | PRN
  Administered 2016-04-09: 100 mL via INTRAVENOUS

## 2016-04-10 ENCOUNTER — Encounter (HOSPITAL_COMMUNITY): Payer: PPO | Attending: Oncology | Admitting: Oncology

## 2016-04-10 ENCOUNTER — Encounter (HOSPITAL_COMMUNITY): Payer: Self-pay

## 2016-04-10 ENCOUNTER — Encounter (HOSPITAL_COMMUNITY): Payer: Self-pay | Admitting: Lab

## 2016-04-10 VITALS — BP 99/57 | HR 60 | Temp 98.7°F | Resp 18 | Wt 198.7 lb

## 2016-04-10 DIAGNOSIS — Z8042 Family history of malignant neoplasm of prostate: Secondary | ICD-10-CM | POA: Diagnosis not present

## 2016-04-10 DIAGNOSIS — R911 Solitary pulmonary nodule: Secondary | ICD-10-CM

## 2016-04-10 DIAGNOSIS — C029 Malignant neoplasm of tongue, unspecified: Secondary | ICD-10-CM

## 2016-04-10 DIAGNOSIS — Z720 Tobacco use: Secondary | ICD-10-CM

## 2016-04-10 DIAGNOSIS — Z85810 Personal history of malignant neoplasm of tongue: Secondary | ICD-10-CM

## 2016-04-10 DIAGNOSIS — C01 Malignant neoplasm of base of tongue: Secondary | ICD-10-CM

## 2016-04-10 NOTE — Progress Notes (Signed)
PROGRESS NOTE   [name], MD [address] [place] [address]  DIAGNOSIS: Cancer Staging No matching staging information was found for the patient.   CURRENT THERAPY:  INTERVAL HISTORY: Darrell White is a 66 y.o. 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     Squamous cell carcinoma of tongue ([place])   03/14/2016 Miscellaneous    Dr. Benjamine Mola (ENT) consult      03/14/2016 Procedure    Flex laryngoscopy-base of tongue with ulcerative mass in the right side.      03/26/2016 Procedure    Biopsy of right tongue by  Dr. Benjamine Mola      03/27/2016 Pathology Results    Invasive squamous cell carcinoma.      04/02/2016 Miscellaneous    Pathology contacted and requested HPV testing on malignancy specimen.      04/09/2016 Imaging    CT soft tissue neck: IMPRESSION: Enhancing mass base of tongue on the right measures 25 x 27 mm compatible with carcinoma. No pathologic adenopathy in the neck.  Extensive carotid atherosclerotic disease. There is significant carotid stenosis on the left. Carotid duplex or CTA recommended for further evaluation.      04/09/2016 Imaging    CT chest w contrast: IMPRESSION: 1. Irregular spiculated 2.0 cm apical right upper lobe pulmonary nodule, new since 07/31/2006 PET-CT, morphologically suspicious for malignancy, which could represent a metastasis versus a synchronous primary bronchogenic carcinoma. 2. Separate tiny 3 mm solid pulmonary nodule in the right upper lobe, also new since 2008, indeterminate metastasis. 3. Mild right hilar lymphadenopathy, cannot exclude nodal metastasis. 4. Consider PET-CT for further staging evaluation. 5. Mild  radiation fibrosis in the medial apical upper lobes bilaterally. 6. Additional findings include aortic atherosclerosis, 2 vessel coronary atherosclerosis and mild centrilobular emphysema.      He is still smoking. He is very anxious about his diagnosis. His wife says he hasn't been eating or drinking anything because his tongue is sore. Denies SOB, chest pain, abdominal pain, or any other concerns today.   MEDICAL HISTORY: Past Medical History:  Diagnosis Date  . Anxiety   . Arthritis   . Cancer ([place])    of mouth  . Depression   . GERD (gastroesophageal reflux disease)   . HOH (hard of hearing)   . Hypercholesteremia   . Hypertension   . Hypothyroid   . Neuropathy ([place])   . Squamous cell carcinoma of tongue ([place]) 04/02/2016    SURGICAL HISTORY: Past Surgical History:  Procedure Laterality Date  . BACK SURGERY     had total of 7 surgeries with rods and plates  . CATARACT EXTRACTION W/PHACO Left 04/09/2012   Procedure: CATARACT EXTRACTION PHACO AND INTRAOCULAR LENS PLACEMENT (IOC);  Surgeon: Tonny Branch, MD;  Location: [facility];  Service: Ophthalmology;  Laterality: Left;  CDE: 12.44  . CERVICAL DISC SURGERY    . DIRECT LARYNGOSCOPY N/A 03/26/2016   Procedure: DIRECT LARYNGOSCOPY;  Surgeon: Leta Baptist, MD;  Location: [facility];  Service: ENT;  Laterality: N/A;  . EXCISION OF TONGUE LESION Right 03/26/2016   Procedure: BIOPSY OF RIGHT TONGUE BASE MASS;  Surgeon: Leta Baptist, MD;  Location: [facility];  Service: ENT;  Laterality:  Right;  Marland Kitchen KNEE ARTHROSCOPY     right knee  . MOUTH SURGERY     removal of cancer  . OTHER SURGICAL HISTORY     insertion of pain pump  . SHOULDER ARTHROSCOPY     bilateral  . SPINAL FUSION    . SPINE SURGERY     insertion of morphine pump into spine    SOCIAL HISTORY: Social History   Social History  . Marital status: Married    Spouse name: N/A  . Number of children: N/A  . Years of education: N/A   Occupational  History  . Not on file.   Social History Main Topics  . Smoking status: Current Every Day Smoker    Packs/day: 3.00    Years: 55.00    Types: Cigarettes  . Smokeless tobacco: Never Used  . Alcohol use No     Comment: H/O EtOHism drinking heavy- boubon/beer, quit ~ 2000  . Drug use: No  . Sexual activity: Yes    Birth control/ protection: None   Other Topics Concern  . Not on file   Social History Narrative  . No narrative on file    FAMILY HISTORY: Family History  Problem Relation Age of Onset  . Cancer Father   . Cancer Brother     Prostate cancer    Review of Systems  Constitutional:       Loss of appetite  HENT:       Tongue sore  Eyes: Negative.   Respiratory: Negative.  Negative for shortness of breath.   Cardiovascular: Negative.  Negative for chest pain.  Gastrointestinal: Negative.  Negative for abdominal pain.  Genitourinary: Negative.   Musculoskeletal: Negative.   Skin: Negative.   Neurological: Negative.   Endo/Heme/Allergies: Negative.   Psychiatric/Behavioral: The patient is nervous/anxious.   All other systems reviewed and are negative. 14 point review of systems was performed and is negative except as detailed under history of present illness and above  PHYSICAL EXAMINATION  ECOG PERFORMANCE STATUS: 1 - Symptomatic but completely ambulatory  Vitals:   04/10/16 0927  BP: (!) 99/57  Pulse: 60  Resp: 18  Temp: 98.7 F (37.1 C)   Physical Exam  Constitutional: He is oriented to person, place, and time and well-developed, well-nourished, and in no distress.  HENT:  Head: Normocephalic and atraumatic.  Mouth/Throat: Oropharynx is clear and moist.  Eyes: Conjunctivae and EOM are normal. Pupils are equal, round, and reactive to light.  Neck: Normal range of motion. Neck supple.  Cardiovascular: Normal rate, regular rhythm and normal heart sounds.   Pulmonary/Chest: Effort normal and breath sounds normal.  Abdominal: Soft. Bowel sounds are  normal.  Musculoskeletal: Normal range of motion.  Neurological: He is alert and oriented to person, place, and time. Gait normal.  Skin: Skin is warm and dry.  Nursing note and vitals reviewed.  LABORATORY DATA:  CBC    Component Value Date/Time   WBC 6.3 04/02/2016 1554   RBC 4.52 04/02/2016 1554   HGB 13.7 04/02/2016 1554   HCT 41.2 04/02/2016 1554   PLT 189 04/02/2016 1554   MCV 91.2 04/02/2016 1554   MCH 30.3 04/02/2016 1554   MCHC 33.3 04/02/2016 1554   RDW 13.5 04/02/2016 1554   LYMPHSABS 1.3 04/02/2016 1554   MONOABS 0.8 04/02/2016 1554   EOSABS 0.2 04/02/2016 1554   BASOSABS 0.0 04/02/2016 1554    CMP     Component Value Date/Time   NA 137 04/02/2016 1554  K 4.8 04/02/2016 1554   CL 101 04/02/2016 1554   CO2 30 04/02/2016 1554   GLUCOSE 95 04/02/2016 1554   BUN 13 04/02/2016 1554   CREATININE 0.92 04/02/2016 1554   CALCIUM 9.3 04/02/2016 1554   PROT 7.2 04/02/2016 1554   ALBUMIN 4.3 04/02/2016 1554   AST 17 04/02/2016 1554   ALT 10 (L) 04/02/2016 1554   ALKPHOS 39 04/02/2016 1554   BILITOT 0.4 04/02/2016 1554   GFRNONAA >60 04/02/2016 1554   GFRAA >60 04/02/2016 1554    RADIOGRAPHIC STUDIES:  Ct Soft Tissue Neck W Contrast  Result Date: 04/09/2016 CLINICAL DATA:  Staging squamous cell carcinoma times. No treatment yet. History of tongue cancer 10 years ago. EXAM: CT NECK WITH CONTRAST TECHNIQUE: Multidetector CT imaging of the neck was performed using the standard protocol following the bolus administration of intravenous contrast. CONTRAST:  161m ISOVUE-300 IOPAMIDOL (ISOVUE-300) INJECTION 61% COMPARISON:  CT neck 08/19/2012 FINDINGS: Pharynx and larynx: Enhancing mass in the base of tongue on the right measuring 25 x 27 mm. This was not present previously and is most compatible with carcinoma. Epiglottis and larynx intact. Airway intact. Nasopharynx negative. Salivary glands: Normal parotid bilaterally. Submandibular glands negative. Thyroid: Negative  Lymph nodes: No pathologic lymph nodes identified in the neck. Vascular: Extensive atherosclerotic disease of the carotid bifurcation bilaterally. There appears to be moderate to severe stenosis on the left and mild stenosis on the right. Carotid duplex or CTA recommended for further evaluation. Jugular vein patent bilaterally. Limited intracranial: Negative Visualized orbits: Negative Mastoids and visualized paranasal sinuses: Mucosal edema in the paranasal sinuses. Mastoid sinus is clear. Negative orbit. Skeleton: ACDF with solid fusion C5-6. ACDF with pseudarthrosis C6-7. No fracture or skeletal metastasis. Upper chest: Chest CT reported separately Other: None IMPRESSION: Enhancing mass base of tongue on the right measures 25 x 27 mm compatible with carcinoma. No pathologic adenopathy in the neck. Extensive carotid atherosclerotic disease. There is significant carotid stenosis on the left. Carotid duplex or CTA recommended for further evaluation. Electronically Signed   By: CFranchot GalloM.D.   On: 04/09/2016 20:32   Ct Chest W Contrast  Result Date: 04/10/2016 CLINICAL DATA:  New diagnosis of invasive squamous cell carcinoma of the tongue. Chest staging. EXAM: CT CHEST WITH CONTRAST TECHNIQUE: Multidetector CT imaging of the chest was performed during intravenous contrast administration. CONTRAST:  1056mISOVUE-300 IOPAMIDOL (ISOVUE-300) INJECTION 61% COMPARISON:  05/05/2015 chest radiograph.   07/31/2006 head CT. FINDINGS: Cardiovascular: Normal heart size. No significant pericardial fluid/thickening. Left anterior descending and right coronary atherosclerosis. Atherosclerotic nonaneurysmal thoracic aorta. Normal caliber pulmonary arteries. No central pulmonary emboli. Mediastinum/Nodes: No discrete thyroid nodules. Unremarkable esophagus. No axillary adenopathy. No pathologically enlarged mediastinal lymph nodes. Mildly enlarged 1.1 cm right hilar lymph node (series 2/ image 69) no additional  pathologically enlarged hilar lymph nodes. Lungs/Pleura: No pneumothorax. No pleural effusion. Irregular spiculated 2.0 x 1.7 cm apical right upper lobe pulmonary nodule (series 4/ image 24), new since 07/31/2006. Anterior right upper lobe 3 mm solid pulmonary nodule (series 4/ image 60), new since 2008. Sharply marginated bandlike consolidation in the medial apical upper lobes bilaterally, compatible with radiation fibrosis. No acute consolidative airspace disease or additional significant pulmonary nodules. Mild centrilobular emphysema. Upper abdomen: Unremarkable. Musculoskeletal: No aggressive appearing focal osseous lesions. Mild thoracic spondylosis. Surgical hardware from ACDF is partially visualized in the lower cervical spine. Inferior approach lead is seen terminating within the lower thoracic spinal canal. IMPRESSION: 1. Irregular spiculated 2.0 cm apical right  upper lobe pulmonary nodule, new since 07/31/2006 PET-CT, morphologically suspicious for malignancy, which could represent a metastasis versus a synchronous primary bronchogenic carcinoma. 2. Separate tiny 3 mm solid pulmonary nodule in the right upper lobe, also new since 2008, indeterminate metastasis. 3. Mild right hilar lymphadenopathy, cannot exclude nodal metastasis. 4. Consider PET-CT for further staging evaluation. 5. Mild radiation fibrosis in the medial apical upper lobes bilaterally. 6. Additional findings include aortic atherosclerosis, 2 vessel coronary atherosclerosis and mild centrilobular emphysema. Electronically Signed   By: Ilona Sorrel M.D.   On: 04/10/2016 09:02    PATHOLOGY:  Diagnosis Tongue, biopsy, Right - INVASIVE SQUAMOUS CELL CARCINOMA.  ASSESSMENT and THERAPY PLAN:  Squamous cell carcinoma of tongue (H[facility]) Invasive squamous cell carcinoma of right tongue, status post biopsy by Dr. Benjamine Mola on 03/26/2016.  Reviewed the CT scan with the patient in detail. He has a spiculated 2 cm mass in the RUL and also some  subcentimeter nodules in the RUL. It is unclear whether this is a lung primary vs. Metastatic spread from the [facility] of tongue. Stat referral to Dr. Luan Pulling for biopsy of the RUL spiculated mass.   Ordered stat PET-CT to complete staging.   He will return for follow up in 2-3 weeks, after his PET scan and bronchoscopy. We will determine treatment plan on the next visit.   Orders Placed This Encounter  Procedures  . NM PET Image Initial (PI) Skull Base To Thigh    Standing Status:   Future    Standing Expiration Date:   04/10/2017    Order Specific Question:   Reason for Exam (SYMPTOM  OR DIAGNOSIS REQUIRED)    Answer:   SCC of tongue with RUL lung masses    Order Specific Question:   If indicated for the ordered procedure, I authorize the administration of a radiopharmaceutical per Radiology protocol    Answer:   Yes    Order Specific Question:   Preferred imaging location?    Answer:   [facility]    Order Specific Question:   Radiology Contrast Protocol will attach to exam    Answer:   [email or web address]    All questions were answered. The patient knows to call the clinic with any problems, questions or concerns. We can certainly see the patient much sooner if necessary. This note was electronically signed.  This document serves as a record of services personally performed by Twana First, MD. It was created on her behalf by Martinique Casey, a trained medical scribe. The creation of this record is based on the scribe's personal observations and the provider's statements to them. This document has been checked and approved by the attending provider.  I have reviewed the above documentation for accuracy and completeness and I agree with the above.  Martinique M Casey 04/10/2016

## 2016-04-10 NOTE — Progress Notes (Unsigned)
Referral to Dr Luan Pulling.  Records faxed on 3/7.  They are to call patient with appt.

## 2016-04-10 NOTE — Patient Instructions (Signed)
[place] at [facility] Discharge Instructions  RECOMMENDATIONS MADE BY THE CONSULTANT AND ANY TEST RESULTS WILL BE SENT TO YOUR REFERRING PHYSICIAN.  You were seen today by Dr. Twana First We will refer you to Dr. Luan Pulling for bronchoscopy We will schedule your PET scan Follow up in 2-3 weeks after PET and procedure with Dr. Luan Pulling See [name] up front for appointments   Thank you for choosing [place] at [facility] to provide your oncology and hematology care.  To afford each patient quality time with our provider, please arrive at least 15 minutes before your scheduled appointment time.    If you have a lab appointment with the [facility] please come in thru the  Main Entrance and check in at the main information desk  You need to re-schedule your appointment should you arrive 10 or more minutes late.  We strive to give you quality time with our providers, and arriving late affects you and other patients whose appointments are after yours.  Also, if you no show three or more times for appointments you may be dismissed from the clinic at the providers discretion.     Again, thank you for choosing [facility].  Our hope is that these requests will decrease the amount of time that you wait before being seen by our physicians.       _____________________________________________________________  Should you have questions after your visit to [facility], please contact our office at [phone] between the hours of 8:30 a.m. and 4:30 p.m.  Voicemails left after 4:30 p.m. will not be returned until the following business day.  For prescription refill requests, have your pharmacy contact our office.       Resources For Cancer Patients and their Caregivers ? American Cancer Society: Can assist with transportation, wigs, general needs, runs Look Good Feel Better.        514-863-2682 ? Cancer Care: Provides  financial assistance, online support groups, medication/co-pay assistance.  1-800-813-HOPE 509-296-4791) ? [place] Assists [place] cancer patients and their families through emotional , educational and financial support.  7853701729 ? [place] DSS Where to apply for food stamps, Medicaid and utility assistance. 2231329601 ? [facility]: Transportation to medical appointments. (985) 103-5553 ? Social Security Administration: May apply for disability if have a Stage IV cancer. 810-854-8581 (352)815-2570 ? [employer], Disability and Transit Services: Assists with nutrition, care and transit needs. [place] Support Programs: '@10RELATIVEDAYS'$ @ > Cancer Support Group  2nd Tuesday of the month 1pm-2pm, Journey Room  > Creative Journey  3rd Tuesday of the month 1130am-1pm, Journey Room  > Look Good Feel Better  1st Wednesday of the month 10am-12 noon, Journey Room (Call [facility] to register [phone])

## 2016-04-11 DIAGNOSIS — M5136 Other intervertebral disc degeneration, lumbar region: Secondary | ICD-10-CM | POA: Diagnosis not present

## 2016-04-16 DIAGNOSIS — C029 Malignant neoplasm of tongue, unspecified: Secondary | ICD-10-CM | POA: Diagnosis not present

## 2016-04-16 DIAGNOSIS — I10 Essential (primary) hypertension: Secondary | ICD-10-CM | POA: Diagnosis not present

## 2016-04-16 DIAGNOSIS — J449 Chronic obstructive pulmonary disease, unspecified: Secondary | ICD-10-CM | POA: Diagnosis not present

## 2016-04-16 DIAGNOSIS — R918 Other nonspecific abnormal finding of lung field: Secondary | ICD-10-CM | POA: Diagnosis not present

## 2016-04-18 ENCOUNTER — Encounter (HOSPITAL_COMMUNITY): Payer: PPO

## 2016-04-18 ENCOUNTER — Telehealth (HOSPITAL_COMMUNITY): Payer: Self-pay | Admitting: Oncology

## 2016-04-18 NOTE — Telephone Encounter (Signed)
FAXED PET PRE [place]

## 2016-04-26 ENCOUNTER — Other Ambulatory Visit (HOSPITAL_COMMUNITY): Payer: Self-pay | Admitting: Adult Health

## 2016-04-26 DIAGNOSIS — C029 Malignant neoplasm of tongue, unspecified: Secondary | ICD-10-CM

## 2016-04-26 NOTE — Progress Notes (Signed)
Patient/patient's wife contacted clinic stating that their insurance is requesting stat PET scan imaging. PET orders re-entered for STAT PET scan for tongue cancer.    Darrell Raider, RN will notify patient/family. I will ask our schedulers to help facilitate getting imaging and subsequent follow-up with Dr. Talbert Cage rescheduled.   Mike Craze, NP [place] [phone]

## 2016-04-29 ENCOUNTER — Encounter (HOSPITAL_COMMUNITY): Payer: PPO

## 2016-04-30 ENCOUNTER — Ambulatory Visit (HOSPITAL_COMMUNITY): Payer: PPO

## 2016-05-01 ENCOUNTER — Ambulatory Visit: Payer: PPO

## 2016-05-02 ENCOUNTER — Ambulatory Visit (HOSPITAL_COMMUNITY): Payer: PPO

## 2016-05-03 ENCOUNTER — Ambulatory Visit (HOSPITAL_COMMUNITY)
Admission: RE | Admit: 2016-05-03 | Discharge: 2016-05-03 | Disposition: A | Discharge status: Home / Self Care | Payer: PPO | Source: Ambulatory Visit | Attending: Oncology | Admitting: Oncology

## 2016-05-03 ENCOUNTER — Encounter (HOSPITAL_COMMUNITY): Payer: PPO

## 2016-05-03 DIAGNOSIS — C3490 Malignant neoplasm of unspecified part of unspecified bronchus or lung: Secondary | ICD-10-CM | POA: Diagnosis not present

## 2016-05-03 DIAGNOSIS — R918 Other nonspecific abnormal finding of lung field: Secondary | ICD-10-CM | POA: Insufficient documentation

## 2016-05-03 DIAGNOSIS — C029 Malignant neoplasm of tongue, unspecified: Secondary | ICD-10-CM

## 2016-05-03 LAB — GLUCOSE, CAPILLARY: Glucose-Capillary: 106 mg/dL — ABNORMAL HIGH (ref 65–99)

## 2016-05-03 MED ORDER — FLUDEOXYGLUCOSE F - 18 (FDG) INJECTION: 9.4000 | Freq: Once | INTRAVENOUS | Status: DC | PRN

## 2016-05-06 NOTE — Progress Notes (Signed)
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 Benjamine Mola (ENT) consult      03/14/2016 Procedure    Flex laryngoscopy-base of tongue with ulcerative mass in the right side.      03/26/2016 Procedure    Biopsy of right tongue by  Dr. Benjamine Mola      03/27/2016 Pathology Results    Invasive squamous cell carcinoma.      04/02/2016 Miscellaneous    Pathology contacted and requested HPV testing on malignancy specimen.      04/09/2016 Imaging    CT soft tissue neck: IMPRESSION: Enhancing mass base of tongue on the right measures 25 x 27 mm compatible with carcinoma. No pathologic adenopathy in the neck.  Extensive carotid atherosclerotic disease. There is significant carotid stenosis on the left. Carotid duplex or CTA recommended for further evaluation.      04/09/2016 Imaging    CT chest w contrast: IMPRESSION: 1. Irregular spiculated 2.0 cm apical right upper lobe pulmonary nodule, new since 07/31/2006 PET-CT, morphologically suspicious for malignancy, which could represent a metastasis versus a synchronous primary bronchogenic carcinoma. 2. Separate tiny 3 mm solid pulmonary nodule in the right upper lobe, also new since 2008, indeterminate metastasis. 3. Mild right hilar lymphadenopathy, cannot exclude nodal metastasis. 4. Consider PET-CT for further staging evaluation. 5. Mild radiation fibrosis in the medial apical upper lobes bilaterally. 6. Additional findings include aortic atherosclerosis, 2 vessel coronary atherosclerosis and mild centrilobular emphysema.      05/03/2016 PET scan   Hypermetabolic mass (R) base of tongue and lingular tonsil c/w H&N cancer recurrence. No evidence of metastatic adenopathy in neck. Hypermetabolic nodule in RUL lung measuring 15 mm concerning for pulmonary mets vs primary lung carcinoma (favor primary lung cancer). Additional small sub-5 mm pulmonary nodules are favored to be benign.       04/2016 Pathology Results    Focal staining for p16 positive in approx 5-10% of squamous cells.         HISTORY OF PRESENT ILLNESS:  (From Dr. Laverle Patter last note on 04/10/16)     INTERVAL HISTORY:  Darrell White 66 y.o. male returns for follow-up after recent PET scan to discuss results and next steps regarding recently diagnosed right base of tongue/right tonsil cancer.   He has worsening dysphagia; he can only swallow liquids or soft foods because of the oropharyngeal mass. Reports that his appetite is 50%. His energy levels are not great; reports energy levels at 25% from his normal.  Endorses worsening pain to his throat, that radiates to his right jaw to his right temple.   He reports that he has an appointment to see his pain management specialists in [facility] this week; he has been seeing them quite some time for chronic back pain. States that he is currently on Morphine pump, OxyContin, and Neurontin.  Endorses having problems with constipation at times, requiring use of OTC Ex-Lax.   He wants to review the PET scan today. He states, "I just want to know what is next because I'm ready to get this treatment started and over with."  REVIEW OF SYSTEMS:  Review of Systems  Constitutional: Positive for fatigue. Negative for chills and fever.  HENT:   Positive for sore throat and trouble swallowing.   Eyes: Negative.   Respiratory: Negative.  Negative for cough and shortness of breath.   Cardiovascular: Negative.  Negative for leg swelling.  Gastrointestinal: Positive for constipation. Negative for abdominal pain, nausea and vomiting.  Endocrine:  Negative.   Genitourinary: Negative.  Negative for dysuria and hematuria.   Musculoskeletal: Positive for arthralgias and back pain.       Chronic orthopedic pain (managed by pain specialists)  Skin: Negative.  Negative for rash.  Neurological: Positive for headaches.  Hematological: Negative.      PAST MEDICAL/SURGICAL HISTORY:  Past Medical History:  Diagnosis Date  . Anxiety   . Arthritis   . Cancer ([place])    of mouth  . Depression   . GERD (gastroesophageal reflux disease)   . HOH (hard of hearing)   . Hypercholesteremia   . Hypertension   . Hypothyroid   . Neuropathy ([place])   . Squamous cell carcinoma of tongue ([place]) 04/02/2016   Past Surgical History:  Procedure Laterality Date  . BACK SURGERY     had total of 7 surgeries with rods and plates  . CATARACT EXTRACTION W/PHACO Left 04/09/2012   Procedure: CATARACT EXTRACTION PHACO AND INTRAOCULAR LENS PLACEMENT (IOC);  Surgeon: Tonny Branch, MD;  Location: [facility];  Service: Ophthalmology;  Laterality: Left;  CDE: 12.44  . CERVICAL DISC SURGERY    . DIRECT LARYNGOSCOPY N/A 03/26/2016   Procedure: DIRECT LARYNGOSCOPY;  Surgeon: Leta Baptist, MD;  Location: [facility];  Service: ENT;  Laterality: N/A;  . EXCISION OF TONGUE LESION Right 03/26/2016   Procedure: BIOPSY OF RIGHT TONGUE BASE MASS;  Surgeon: Leta Baptist, MD;  Location: [facility];  Service: ENT;  Laterality: Right;  . KNEE ARTHROSCOPY     right knee  . MOUTH SURGERY     removal of cancer  . OTHER SURGICAL HISTORY     insertion of pain pump  . SHOULDER ARTHROSCOPY     bilateral  . SPINAL FUSION    . SPINE SURGERY     insertion of morphine pump into spine     SOCIAL HISTORY:  Social History   Social History  . Marital status: Married    Spouse name: N/A  . Number of children: N/A  . Years of education: N/A   Occupational History  . Not on file.   Social History Main Topics  . Smoking status: Current Every Day Smoker    Packs/day:  3.00    Years: 55.00    Types: Cigarettes  . Smokeless tobacco: Never Used  . Alcohol use No     Comment: H/O EtOHism drinking heavy- boubon/beer, quit ~ 2000  . Drug use: No  . Sexual activity: Yes    Birth control/ protection: None   Other Topics Concern  . Not on file   Social History Narrative  . No narrative on file    FAMILY HISTORY:  Family History  Problem Relation Age of Onset  . Cancer Father   . Cancer Brother     Prostate cancer    CURRENT MEDICATIONS:  Outpatient Encounter Prescriptions as of 05/07/2016  Medication Sig  . atorvastatin (LIPITOR) 20 MG tablet Take 20 mg by mouth at bedtime.  . gabapentin (NEURONTIN) 300 MG capsule Take 300 mg by mouth 3 (three) times daily.  Marland Kitchen levothyroxine (  SYNTHROID, LEVOTHROID) 75 MCG tablet Take 75 mcg by mouth daily before breakfast.  . metoprolol succinate (TOPROL-XL) 25 MG 24 hr tablet Take 25 mg by mouth at bedtime.  Marland Kitchen oxyCODONE (OXYCONTIN) 15 mg 12 hr tablet Take 30 mg by mouth 2 (two) times daily.  . traZODone (DESYREL) 50 MG tablet Take 150 mg by mouth at bedtime.   Facility-Administered Encounter Medications as of 05/07/2016  Medication  . fludeoxyglucose F - 18 (FDG) injection 9.4 millicurie    ALLERGIES:  No Known Allergies   PHYSICAL EXAM:  ECOG Performance status: 1 - Symptomatic, but independent.   Vitals:   05/07/16 1009  BP: 125/89  Pulse: 60  Resp: 16  Temp: 99.2 F (37.3 C)   Filed Weights   05/07/16 1009  Weight: 205 lb 14.4 oz (93.4 kg)    Physical Exam  Constitutional: He is oriented to person, place, and time and well-developed, well-nourished, and in no distress.  HENT:  Head: Normocephalic.  Apparent asymmetry in base of tongue and posterior oropharynx. Posterior oropharyngeal erythema.   Eyes: Conjunctivae are normal. Pupils are equal, round, and reactive to light. No scleral icterus.  Neck: Normal range of motion. Neck supple.  Cardiovascular: Normal rate, regular rhythm and normal  heart sounds.   Pulmonary/Chest: Effort normal. He has no wheezes.  Bilat upper lobe mild rhonchi   Abdominal: Soft. Bowel sounds are normal. There is no tenderness.  Musculoskeletal: Normal range of motion. He exhibits no edema.  Lymphadenopathy:    He has no cervical adenopathy.  Neurological: He is alert and oriented to person, place, and time. Gait normal.  Skin: Skin is warm and dry. No rash noted.  Psychiatric: Mood, memory, affect and judgment normal.  Nursing note and vitals reviewed.    LABORATORY DATA:  I have reviewed the labs as listed.  CBC    Component Value Date/Time   WBC 6.3 04/02/2016 1554   RBC 4.52 04/02/2016 1554   HGB 13.7 04/02/2016 1554   HCT 41.2 04/02/2016 1554   PLT 189 04/02/2016 1554   MCV 91.2 04/02/2016 1554   MCH 30.3 04/02/2016 1554   MCHC 33.3 04/02/2016 1554   RDW 13.5 04/02/2016 1554   LYMPHSABS 1.3 04/02/2016 1554   MONOABS 0.8 04/02/2016 1554   EOSABS 0.2 04/02/2016 1554   BASOSABS 0.0 04/02/2016 1554   CMP Latest Ref Rng & Units 04/02/2016 08/19/2012 04/02/2012  Glucose 65 - 99 mg/dL 95 99 150(H)  BUN 6 - 20 mg/dL '13 13 18  '$ Creatinine 0.61 - 1.24 mg/dL 0.92 1.02 1.04  Sodium 135 - 145 mmol/L 137 137 137  Potassium 3.5 - 5.1 mmol/L 4.8 4.0 4.1  Chloride 101 - 111 mmol/L 101 100 100  CO2 22 - 32 mmol/L 30 32 26  Calcium 8.9 - 10.3 mg/dL 9.3 9.8 9.6  Total Protein 6.5 - 8.1 g/dL 7.2 - -  Total Bilirubin 0.3 - 1.2 mg/dL 0.4 - -  Alkaline Phos 38 - 126 U/L 39 - -  AST 15 - 41 U/L 17 - -  ALT 17 - 63 U/L 10(L) - -    PENDING LABS:    DIAGNOSTIC IMAGING:  PET scan: 05/03/16     PATHOLOGY:  (R) base of tongue biopsy: 03/26/16     ASSESSMENT & PLAN:   Squamous cell carcinoma right base of tongue/tonsil:  -Focally p16 positive in only 5-10% of squamous cells.  -PET scan done on 05/03/16; results reviewed in detail with patient today and he was  given a copy of the report per his request.  PET revealed hypermetabolic activity to  right BOT/tonsil and mass in RUL lung. He is anxious to get treatment started as soon as possible.  -For radiation, we discussed 2 options including going to a [facility] facility in [place] vs [facility] facility in [place].  Given that he lives in [place] and had previous radiation in [place], he prefers to return to [place] for radiation consideration.    Lung mets vs Primary bronchogenic neoplasm:  -Per patient, he saw Dr. Luan Pulling with pulmonology about 2 weeks ago and Dr. Luan Pulling did not feel the mass was amenable to biopsy via bronchoscopy.   -Orders placed today for CT biopsy to be done at [facility]; InBasket message sent to Dr. Jacqulynn Cadet in radiology regarding this patient.   -Darrell White understands the importance of ascertaining if we are dealing with a metastatic H&N cancer vs primary lung cancer, his treatment choices and overall prognosis would be different.  -He will need referrals to the following specialists, based on final treatment plan:   *Radiation Oncology   *Oncology dentistry with Dr. Enrique Sack  *Speech therapy  *Nutrition  *Consider PEG tube placement, particularly if concurrent chemoXRT.     Dysphagia:  -Likely secondary to base of tongue/tonsil mass. Reiterated the importance of nutrition during treatment for H&N cancer. I will place referral to nutrition for dietary recommendations for Darrell White as he prepares for H&N cancer treatment.  -He will also need referral to Speech Therapy; we will place this referral at upcoming appt once treatment plan finalized.  -Recommended he supplement his soft diet with Ensure/Boost/Glucerna, as tolerated.  Encouraged him to consume high-protein/high-calorie foods in preparation for treatment.   Pain management:  -Sees pain specialist in [facility] for chronic orthopedic pain. Currently, he tells me he is on Morphine pump, OxyContin, & Gabapentin.  He is scheduled to see his pain specialist later this week.  -Recommended he  share his cancer diagnosis with his pain team. Offered to patient that we are happy to try to help manage his acute oncologic pain, but we need the blessing from his pain team before doing so.  If they need to contact our office to coordinate care, we are happy to do that as well.  -He understands that we will not prescribe pain medications while he is under the care of the pain specialists in [place] without their permission, as we do not want to disrupt his pain contract with them.  He will talk with these specialists and let us know if there is anything we can do to help.    Bowel regimen:  -Recommended he use Miralax daily, +/- stool softeners to help prevent constipation with chronic opiate use.    Dispo:  -CT biopsy of lung mass ASAP -Return to cancer center in 2 weeks to solidify treatment plan once path confirmed of lung mass.    All questions were answered to patient's stated satisfaction. Encouraged patient to call with any new concerns or questions before his next visit to the cancer center and we can certain see him sooner, if needed.    Plan of care discussed with Dr. Twana First, who agrees with the above aforementioned.     Orders placed this encounter:  Orders Placed This Encounter  Procedures  . CT BIOPSY      Mike Craze, [place] [phone]

## 2016-05-07 ENCOUNTER — Encounter (HOSPITAL_COMMUNITY): Payer: PPO | Attending: Oncology | Admitting: Adult Health

## 2016-05-07 ENCOUNTER — Encounter (HOSPITAL_COMMUNITY): Payer: Self-pay | Admitting: Adult Health

## 2016-05-07 VITALS — BP 125/89 | HR 60 | Temp 99.2°F | Resp 16 | Wt 205.9 lb

## 2016-05-07 DIAGNOSIS — R918 Other nonspecific abnormal finding of lung field: Secondary | ICD-10-CM

## 2016-05-07 DIAGNOSIS — C029 Malignant neoplasm of tongue, unspecified: Secondary | ICD-10-CM

## 2016-05-07 DIAGNOSIS — C01 Malignant neoplasm of base of tongue: Secondary | ICD-10-CM | POA: Diagnosis not present

## 2016-05-07 DIAGNOSIS — G8929 Other chronic pain: Secondary | ICD-10-CM | POA: Diagnosis not present

## 2016-05-07 NOTE — Patient Instructions (Addendum)
[name] at [facility] Discharge Instructions  RECOMMENDATIONS MADE BY THE CONSULTANT AND ANY TEST RESULTS WILL BE SENT TO YOUR REFERRING PHYSICIAN.  You saw Darrell Craze, NP, today. CT biopsy and Darrell White.  Return in 2 weeks for follow up and results. See [name] at checkout for appointments.  Thank you for choosing [place] at [facility] to provide your oncology and hematology care.  To afford each patient quality time with our provider, please arrive at least 15 minutes before your scheduled appointment time.    If you have a lab appointment with the [facility] please come in thru the  Main Entrance and check in at the main information desk  You need to re-schedule your appointment should you arrive 10 or more minutes late.  We strive to give you quality time with our providers, and arriving late affects you and other patients whose appointments are after yours.  Also, if you no show three or more times for appointments you may be dismissed from the clinic at the providers discretion.     Again, thank you for choosing [facility].  Our hope is that these requests will decrease the amount of time that you wait before being seen by our physicians.       _____________________________________________________________  Should you have questions after your visit to [facility], please contact our office at [phone] between the hours of 8:30 a.m. and 4:30 p.m.  Voicemails left after 4:30 p.m. will not be returned until the following business day.  For prescription refill requests, have your pharmacy contact our office.       Resources For Cancer Patients and their Caregivers ? American Cancer Society: Can assist with transportation, wigs, general needs, runs Look Good Feel Better.        808 768 6828 ? Cancer Care: Provides financial assistance, online support groups, medication/co-pay assistance.   1-800-813-HOPE 365-881-3356) ? [place] Assists [place] cancer patients and their families through emotional , educational and financial support.  989-275-3091 ? [place] DSS Where to apply for food stamps, Medicaid and utility assistance. 913-174-9480 ? [facility]: Transportation to medical appointments. (928) 633-7767 ? Social Security Administration: May apply for disability if have a Stage IV cancer. 848-510-5668 559-836-1015 ? [employer], Disability and Transit Services: Assists with nutrition, care and transit needs. [place] Support Programs: '@10RELATIVEDAYS'$ @ > Cancer Support Group  2nd Tuesday of the month 1pm-2pm, Journey Room  > Creative Journey  3rd Tuesday of the month 1130am-1pm, Journey Room  > Look Good Feel Better  1st Wednesday of the month 10am-12 noon, Journey Room (Call [facility] to register [phone])

## 2016-05-08 ENCOUNTER — Telehealth (HOSPITAL_COMMUNITY): Payer: Self-pay | Admitting: Emergency Medicine

## 2016-05-08 NOTE — Telephone Encounter (Signed)
Called pt and explained [facility].  He is going to come on 05/14/2016 from 9-11 am.  Meet on the second floor.  He verbalized understanding.    Orders placed for OT and SLP.

## 2016-05-08 NOTE — Addendum Note (Signed)
Addended by: Elenor Legato on: 05/08/2016 10:25 AM   Modules accepted: Orders

## 2016-05-09 ENCOUNTER — Ambulatory Visit (HOSPITAL_COMMUNITY): Payer: PPO

## 2016-05-09 DIAGNOSIS — M5137 Other intervertebral disc degeneration, lumbosacral region: Secondary | ICD-10-CM | POA: Diagnosis not present

## 2016-05-09 DIAGNOSIS — Z9689 Presence of other specified functional implants: Secondary | ICD-10-CM | POA: Diagnosis not present

## 2016-05-09 DIAGNOSIS — C3490 Malignant neoplasm of unspecified part of unspecified bronchus or lung: Secondary | ICD-10-CM | POA: Diagnosis not present

## 2016-05-09 DIAGNOSIS — G8929 Other chronic pain: Secondary | ICD-10-CM | POA: Diagnosis not present

## 2016-05-09 DIAGNOSIS — Z79891 Long term (current) use of opiate analgesic: Secondary | ICD-10-CM | POA: Diagnosis not present

## 2016-05-14 ENCOUNTER — Ambulatory Visit (HOSPITAL_COMMUNITY): Payer: PPO

## 2016-05-14 ENCOUNTER — Ambulatory Visit (HOSPITAL_COMMUNITY): Payer: PPO | Attending: Adult Health

## 2016-05-14 ENCOUNTER — Encounter (HOSPITAL_COMMUNITY): Payer: PPO | Admitting: Dietician

## 2016-05-14 ENCOUNTER — Ambulatory Visit (HOSPITAL_COMMUNITY): Payer: PPO | Admitting: Speech Pathology

## 2016-05-14 DIAGNOSIS — R1312 Dysphagia, oropharyngeal phase: Secondary | ICD-10-CM | POA: Insufficient documentation

## 2016-05-15 ENCOUNTER — Other Ambulatory Visit (HOSPITAL_COMMUNITY): Payer: Self-pay | Admitting: Emergency Medicine

## 2016-05-15 DIAGNOSIS — C029 Malignant neoplasm of tongue, unspecified: Secondary | ICD-10-CM

## 2016-05-17 ENCOUNTER — Other Ambulatory Visit: Payer: Self-pay | Admitting: Radiology

## 2016-05-20 ENCOUNTER — Ambulatory Visit (HOSPITAL_COMMUNITY)
Admission: RE | Admit: 2016-05-20 | Discharge: 2016-05-20 | Disposition: A | Discharge status: Home / Self Care | Payer: PPO | Source: Ambulatory Visit | Attending: Interventional Radiology | Admitting: Interventional Radiology

## 2016-05-20 ENCOUNTER — Ambulatory Visit (HOSPITAL_COMMUNITY)
Admission: RE | Admit: 2016-05-20 | Discharge: 2016-05-20 | Disposition: A | Discharge status: Home / Self Care | Payer: PPO | Source: Ambulatory Visit | Attending: Adult Health | Admitting: Adult Health

## 2016-05-20 ENCOUNTER — Encounter (HOSPITAL_COMMUNITY): Payer: Self-pay

## 2016-05-20 DIAGNOSIS — R918 Other nonspecific abnormal finding of lung field: Secondary | ICD-10-CM | POA: Diagnosis not present

## 2016-05-20 DIAGNOSIS — F17210 Nicotine dependence, cigarettes, uncomplicated: Secondary | ICD-10-CM | POA: Diagnosis not present

## 2016-05-20 DIAGNOSIS — C3411 Malignant neoplasm of upper lobe, right bronchus or lung: Secondary | ICD-10-CM | POA: Diagnosis not present

## 2016-05-20 DIAGNOSIS — C029 Malignant neoplasm of tongue, unspecified: Secondary | ICD-10-CM

## 2016-05-20 DIAGNOSIS — Z9889 Other specified postprocedural states: Secondary | ICD-10-CM | POA: Diagnosis not present

## 2016-05-20 DIAGNOSIS — Z79899 Other long term (current) drug therapy: Secondary | ICD-10-CM | POA: Diagnosis not present

## 2016-05-20 DIAGNOSIS — R911 Solitary pulmonary nodule: Secondary | ICD-10-CM | POA: Diagnosis not present

## 2016-05-20 DIAGNOSIS — J439 Emphysema, unspecified: Secondary | ICD-10-CM | POA: Diagnosis not present

## 2016-05-20 LAB — CBC
HEMATOCRIT: 40.5 % (ref 39.0–52.0)
HEMOGLOBIN: 13.3 g/dL (ref 13.0–17.0)
MCH: 29.8 pg (ref 26.0–34.0)
MCHC: 32.8 g/dL (ref 30.0–36.0)
MCV: 90.6 fL (ref 78.0–100.0)
Platelets: 195 10*3/uL (ref 150–400)
RBC: 4.47 MIL/uL (ref 4.22–5.81)
RDW: 14 % (ref 11.5–15.5)
WBC: 7.1 10*3/uL (ref 4.0–10.5)

## 2016-05-20 LAB — PROTIME-INR
INR: 1.06
Prothrombin Time: 13.8 seconds (ref 11.4–15.2)

## 2016-05-20 LAB — APTT: aPTT: 37 seconds — ABNORMAL HIGH (ref 24–36)

## 2016-05-20 MED ORDER — LIDOCAINE HCL 1 % IJ SOLN
INTRAMUSCULAR | Status: AC
  Filled 2016-05-20: qty 20

## 2016-05-20 MED ORDER — MIDAZOLAM HCL 2 MG/2ML IJ SOLN
INTRAMUSCULAR | Status: AC | PRN
  Administered 2016-05-20 (×4): 1 mg via INTRAVENOUS

## 2016-05-20 MED ORDER — FENTANYL CITRATE (PF) 100 MCG/2ML IJ SOLN
INTRAMUSCULAR | Status: AC | PRN
  Administered 2016-05-20 (×4): 50 ug via INTRAVENOUS

## 2016-05-20 MED ORDER — MIDAZOLAM HCL 2 MG/2ML IJ SOLN
INTRAMUSCULAR | Status: AC
  Filled 2016-05-20: qty 6

## 2016-05-20 MED ORDER — SODIUM CHLORIDE 0.9 % IV SOLN
INTRAVENOUS | Status: DC
  Administered 2016-05-20: 11:00:00 via INTRAVENOUS

## 2016-05-20 MED ORDER — FENTANYL CITRATE (PF) 100 MCG/2ML IJ SOLN
INTRAMUSCULAR | Status: AC
  Filled 2016-05-20: qty 4

## 2016-05-20 NOTE — Procedures (Signed)
Interventional Radiology Procedure Note  Procedure: CT guided biopsy of RUL pulmonary nodule Complications: No immediate Recommendations: - Bedrest until CXR cleared.  Minimize talking, coughing or otherwise straining.  - Follow up 2 hr CXR pending   Signed,  Naziah Weckerly K. Elazar Argabright, MD   

## 2016-05-20 NOTE — H&P (Signed)
Chief Complaint: RUL nodule  Referring Physician(s): Dawson,Gretchen W  Supervising Physician: Jacqulynn Cadet  Patient Status: [facility] - Out-pt  History of Present Illness: Darrell White is a 66 y.o. 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  He reports a 69-monthhistory of burning sensation at the base of his tongue and his throat.  He also reports some radiation of pain to his right ear and head.  His mouth and throat pain has progressively worsened.   He has smoked 2 packs per day of cigarettes for 50+ years. He continues to smoke.  PET scan done 05/03/2016 shows a hypermetabolic mass (R) base of tongue and lingular tonsil c/w H&N cancer recurrence. No evidence of metastatic adenopathy in neck.   There is a hypermetabolic nodule in RUL lung measuring 15 mm concerning for pulmonary mets vs primary lung carcinoma (favor primary lung cancer).  We are asked to perform a CT guided biopsy today  He is NPO. He does not take blood thinners.  Past Medical History:  Diagnosis Date  . Anxiety   . Arthritis   . [place]    of mouth  . Depression   . GERD (gastroesophageal reflux disease)   . HOH (hard of hearing)   . Hypercholesteremia   . Hypertension   . Hypothyroid   . Neuropathy   . Squamous cell carcinoma of [place] ([place] 04/02/2016    Past Surgical History:  Procedure Laterality Date  . BACK SURGERY     had total of 7 surgeries with rods and plates  . CATARACT EXTRACTION W/PHACO Left 04/09/2012   Procedure: CATARACT EXTRACTION PHACO AND INTRAOCULAR LENS PLACEMENT (IOC);  Surgeon: KTonny Branch MD;  Location: [facility];  Service: Ophthalmology;  Laterality: Left;  CDE: 12.44  . CERVICAL DISC SURGERY    . DIRECT LARYNGOSCOPY N/A 03/26/2016   Procedure: DIRECT LARYNGOSCOPY;  Surgeon: SLeta Baptist MD;  [place]  Service: ENT;   Laterality: N/A;  . EXCISION OF TONGUE LESION Right 03/26/2016   Procedure: BIOPSY OF RIGHT TONGUE BASE MASS;  Surgeon: SLeta Baptist MD;  [place]  Service: ENT;  Laterality: Right;  . KNEE ARTHROSCOPY     right knee  . MOUTH SURGERY     removal of cancer  . OTHER SURGICAL HISTORY     insertion of pain pump  . SHOULDER ARTHROSCOPY     bilateral  . SPINAL FUSION    . SPINE SURGERY     insertion of morphine pump into spine    Allergies: Patient has no known allergies.  Medications: Prior to Admission medications   Medication Sig Start Date End Date Taking? Authorizing Provider  atorvastatin (LIPITOR) 20 MG tablet Take 20 mg by mouth daily.    Yes Historical Provider, MD  Docusate Calcium (STOOL SOFTENER PO) Take 1 tablet by mouth daily as needed (constipation).   Yes Historical Provider, MD  gabapentin (NEURONTIN) 300 MG capsule Take 600 mg by mouth daily at 12 noon.    Yes Historical Provider, MD  levothyroxine (SYNTHROID, LEVOTHROID) 75 MCG tablet Take 75 mcg by mouth daily before breakfast.   Yes Historical Provider, MD  metoprolol succinate (TOPROL-XL) 25 MG 24 hr tablet Take 25 mg by mouth at bedtime.   Yes Historical Provider, MD  mirtazapine (REMERON) 45 MG tablet Take 45 mg  by mouth at bedtime.   Yes Historical Provider, MD  naproxen sodium (ANAPROX) 220 MG tablet Take 440 mg by mouth 2 (two) times daily as needed (pain).    Yes Historical Provider, MD  omeprazole (PRILOSEC) 20 MG capsule Take 20 mg by mouth daily as needed (heartburn).   Yes Historical Provider, MD  OVER THE COUNTER MEDICATION Take 1,000 mg by mouth 3 (three) times daily. Hemp oil 1000 mg each   Yes Historical Provider, MD  Oxycodone HCl 20 MG TABS Take 20 mg by mouth 4 (four) times daily.   Yes Historical Provider, MD  vitamin B-12 (CYANOCOBALAMIN) 1000 MCG tablet Take 2,000 mcg by mouth every evening.   Yes Historical Provider, MD     Family History  Problem Relation Age of Onset    . Cancer Father   . Cancer Brother     Prostate cancer    Social History   Social History  . Marital status: Married    Spouse name: N/A  . Number of children: N/A  . Years of education: N/A   Social History Main Topics  . Smoking status: Current Every Day Smoker    Packs/day: 3.00    Years: 55.00    Types: Cigarettes  . Smokeless tobacco: Never Used  . Alcohol use No     Comment: H/O EtOHism drinking heavy- boubon/beer, quit ~ 2000  . Drug use: No  . Sexual activity: Yes    Birth control/ protection: None   Other Topics Concern  . None   Social History Narrative  . None   Review of Systems: A 12 point ROS discussed Review of Systems  Constitutional: Positive for appetite change and unexpected weight change.  HENT: Positive for ear pain, sore throat and trouble swallowing.   Respiratory: Negative.   Cardiovascular: Negative.   Gastrointestinal: Negative.   Genitourinary: Negative.   Musculoskeletal: Negative.   Skin: Negative.   Neurological: Positive for headaches.  Hematological: Negative.   Psychiatric/Behavioral: Negative.     Vital Signs: BP 135/77 (BP Location: Right Arm)   Pulse 62   Temp 98 F (36.7 C) (Oral)   Ht '5\' 8"'$  (1.727 m)   Wt 200 lb (90.7 kg)   SpO2 98%   BMI 30.41 kg/m   Physical Exam  Constitutional: He is oriented to person, place, and time. He appears well-developed.  HENT:  Head: Normocephalic and atraumatic.  Eyes: EOM are normal.  Neck: Normal range of motion.  Cardiovascular: Normal rate, regular rhythm and normal heart sounds.   Pulmonary/Chest: Effort normal.  Diminished breath sounds bilaterally c/w COPD  Abdominal: Soft. He exhibits no distension. There is no tenderness.  Musculoskeletal: Normal range of motion.  Neurological: He is alert and oriented to person, place, and time.  Skin: Skin is warm and dry.  Psychiatric: He has a normal mood and affect. His behavior is normal. Judgment and thought content normal.   Vitals reviewed.   Mallampati Score:  MD Evaluation Airway: WNL Heart: WNL Abdomen: WNL Chest/ Lungs: Other (comments) Chest/ lungs comments: Diminished breath sounds bilaterally  ASA  Classification: 3 Mallampati/Airway Score: One  Imaging: Nm Pet Image Initial (pi) Skull Base To Thigh  Result Date: 05/03/2016 CLINICAL DATA:  Subsequent treatment strategy for lung carcinoma. RIGHT upper lobe lung mass. EXAM: NUCLEAR MEDICINE PET SKULL BASE TO THIGH TECHNIQUE: 9.4 mCi F-18 FDG was injected intravenously. Full-ring PET imaging was performed from the skull base to thigh after the radiotracer. CT data was obtained and used  for attenuation correction and anatomic localization. FASTING BLOOD GLUCOSE:  Value: 106 mg/dl COMPARISON:  PET-CT 07/31/2006;  chest CT 04/09/2016. FINDINGS: NECK Hypermetabolic thickening a tthe RIGHT base of tongue and lingular tonsil is intense with SUV max equal 20.0. No hypermetabolic cervical lymph nodes. No hypermetabolic supraclavicular lymph nodes. CHEST Nodule within the RIGHT upper lobe measures 15 mm (image 55, series 4) and has associated metabolic activity (SUV max equal 4.3). No additional pulmonary nodules. Small subpleural nodule in the RIGHT upper lobe measures 4 mm (image 70, series 4). Small LEFT lower lobe nodule measures 3 mm (image 96, series 4 ABDOMEN/PELVIS No abnormal hypermetabolic activity within the liver, pancreas, adrenal glands, or spleen. No hypermetabolic lymph nodes in the abdomen or pelvis. Prostate normal.  Posterior lumbar fusion SKELETON No focal hypermetabolic activity to suggest skeletal metastasis. IMPRESSION: 1. Hypermetabolic mass at the RIGHT base of tongue and lingular tonsil consistent with head and neck carcinoma recurrence. 2. No evidence of metastatic adenopathy in the neck. 3. Hypermetabolic nodule in the superior segment of the RIGHT lung is concerning for pulmonary metastasis versus primary lung carcinoma. Favor primary lung  cancer. 4. Additional small sub 5 mm pulmonary nodules are favored benign. Electronically Signed   By: Suzy Bouchard M.D.   On: 05/03/2016 14:58    Labs:  CBC:  Recent Labs  04/02/16 1554  WBC 6.3  HGB 13.7  HCT 41.2  PLT 189    COAGS: No results for input(s): INR, APTT in the last 8760 hours.  BMP:  Recent Labs  04/02/16 1554  NA 137  K 4.8  CL 101  CO2 30  GLUCOSE 95  BUN 13  CALCIUM 9.3  CREATININE 0.92  GFRNONAA >60  GFRAA >60    LIVER FUNCTION TESTS:  Recent Labs  04/02/16 1554  BILITOT 0.4  AST 17  ALT 10*  ALKPHOS 39  PROT 7.2  ALBUMIN 4.3    TUMOR MARKERS: No results for input(s): AFPTM, CEA, CA199, CHROMGRNA in the last 8760 hours.  Assessment and Plan:  SCC of the tongue/tonsil  Hypermetabolic nodule in RUL lung measuring 15 mm concerning for pulmonary mets vs primary lung carcinoma (favor primary lung cancer).  Will proceed with CT guided biopsy of the RUL mass today by Dr. Laurence Ferrari  Risks and Benefits discussed with the patient including, but not limited to bleeding, hemoptysis, respiratory failure requiring intubation, infection, pneumothorax requiring chest tube placement, stroke from air embolism or even death.  All of the patient's questions were answered, patient is agreeable to proceed. Consent signed and in chart.  Thank you for this interesting consult.  I greatly enjoyed meeting Darrell White and look forward to participating in their care.  A copy of this report was sent to the requesting provider on this date.  Electronically Signed: Murrell Redden PA-C 05/20/2016, 11:11 AM   I spent a total of  30 Minutes  in face to face in clinical consultation, greater than 50% of which was counseling/coordinating care for lung biopsy

## 2016-05-20 NOTE — Sedation Documentation (Signed)
Patient is resting comfortably. 

## 2016-05-20 NOTE — Discharge Instructions (Signed)
Needle Biopsy of the Lung, Care After °This sheet gives you information about how to care for yourself after your procedure. Your health care provider may also give you more specific instructions. If you have problems or questions, contact your health care provider. °What can I expect after the procedure? °After the procedure, it is common to have: °· Soreness, pain, and tenderness where a tissue sample was taken (biopsy site). °· A cough. °· A sore throat. ° °Follow these instructions at home: °Biopsy site care °· Follow instructions from your health care provider about when to remove the bandage that was placed on the biopsy site. °· Keep the bandage dry until it has been removed. °· Check your biopsy site every day for signs of infection. Check for: °? More redness, swelling, or pain. °? More fluid or blood. °? Warmth to the touch. °? Pus or a bad smell. °General instructions °· Rest as directed by your health care provider. Ask your health care provider what activities are safe for you. °· Do not take baths, swim, or use a hot tub until your health care provider approves. °· Take over-the-counter and prescription medicines only as told by your health care provider. °· If you have airplane travel scheduled, talk with your health care provider about when it is safe for you to travel by airplane. °· It is up to you to get the results of your procedure. Ask your health care provider, or the department that is doing the procedure, when your results will be ready. °· Keep all follow-up visits as told by your health care provider. This is important. °Contact a health care provider if: °· You have more redness, swelling, or pain around your biopsy site. °· You have more fluid or blood coming from your biopsy site. °· Your biopsy site feels warm to the touch. °· You have pus or a bad smell coming from your biopsy site. °· You have a fever. °· You have pain that does not get better with medicine. °Get help right away  if: °· You have problems breathing. °· You have chest pain. °· You cough up blood. °· You faint. °· You have a fast heart rate. °Summary °· After a needle biopsy of the lung, it is common to have a cough, a sore throat, or soreness, pain, and tenderness where a tissue sample was taken (biopsy site). °· You should check your biopsy area every day for signs of infection, including pus or a bad smell, warmth, more fluid or blood, or more redness, swelling, or pain. °· You should not take baths, swim, or use a hot tub until your health care provider approves. °· It is up to you to get the results of your procedure. Ask your health care provider, or the department that is doing the procedure, when your results will be ready. °This information is not intended to replace advice given to you by your health care provider. Make sure you discuss any questions you have with your health care provider. °Document Released: 11/18/2006 Document Revised: 12/13/2015 Document Reviewed: 12/13/2015 °Elsevier Interactive Patient Education © 2017 [employer]vier Inc. ° ° °

## 2016-05-20 NOTE — Sedation Documentation (Signed)
Bandaid R upper back intact

## 2016-05-21 ENCOUNTER — Encounter (HOSPITAL_COMMUNITY): Payer: PPO | Admitting: Dietician

## 2016-05-21 ENCOUNTER — Ambulatory Visit (HOSPITAL_COMMUNITY): Payer: PPO

## 2016-05-22 ENCOUNTER — Encounter (HOSPITAL_COMMUNITY): Payer: Self-pay | Admitting: Lab

## 2016-05-22 NOTE — Progress Notes (Unsigned)
Referral to rad onc [place]. Records faxed on 4/18

## 2016-05-23 ENCOUNTER — Encounter (HOSPITAL_COMMUNITY): Payer: Self-pay | Admitting: Adult Health

## 2016-05-23 NOTE — Progress Notes (Signed)
   Called and spoke with Dr. Corrie Dandy with [facility] in [place] regarding Mr. Ricard Dillon.   Discussed recent pathology of lung lesion being (+) for squamous cell carcinoma; difficult to determine if lung primary or metastatic H&N squamous cell carcinoma. I discussed with Dr. Talbert Cage and given that he has no hypermetabolic nodes in his neck, we are thinking this is 2 early stage primary malignancies.  Both will likely be amenable to radiation alone; may not require chemotherapy.   Referral placed to [facility] for radiation per patient preference.  Per Dr. Quitman Livings, he will evaluate him and may consider sending him to [place] for consideration for SBRT or radiosurgery of lung lesion.  We defer to his medical recommendation.    Dr. Talbert Cage will see the patient next week to discuss plan of care with patient.     Mike Craze, NP [place] [phone]

## 2016-05-24 ENCOUNTER — Encounter (HOSPITAL_COMMUNITY): Payer: PPO

## 2016-05-24 ENCOUNTER — Encounter (HOSPITAL_COMMUNITY): Payer: Self-pay

## 2016-05-24 NOTE — Progress Notes (Signed)
Nutrition Received message that wife called to cancel nutrition appointment today.  Reports patient is not feeling well, nutrition appointment not rescheduled at this time.  Rogue Rafalski B. Zenia Resides, [place], [place] Registered Dietitian [phone] (pager)

## 2016-05-27 ENCOUNTER — Telehealth (HOSPITAL_COMMUNITY): Payer: Self-pay | Admitting: Speech Pathology

## 2016-05-27 ENCOUNTER — Encounter (HOSPITAL_COMMUNITY): Payer: Self-pay

## 2016-05-27 ENCOUNTER — Encounter (HOSPITAL_BASED_OUTPATIENT_CLINIC_OR_DEPARTMENT_OTHER): Payer: PPO | Admitting: Oncology

## 2016-05-27 VITALS — BP 138/66 | HR 59 | Temp 98.7°F | Resp 20 | Wt 204.6 lb

## 2016-05-27 DIAGNOSIS — Z720 Tobacco use: Secondary | ICD-10-CM

## 2016-05-27 DIAGNOSIS — C029 Malignant neoplasm of tongue, unspecified: Secondary | ICD-10-CM

## 2016-05-27 DIAGNOSIS — C01 Malignant neoplasm of base of tongue: Secondary | ICD-10-CM | POA: Diagnosis not present

## 2016-05-27 DIAGNOSIS — C3411 Malignant neoplasm of upper lobe, right bronchus or lung: Secondary | ICD-10-CM | POA: Diagnosis not present

## 2016-05-27 NOTE — Patient Instructions (Addendum)
[place] at [facility] Discharge Instructions  RECOMMENDATIONS MADE BY THE CONSULTANT AND ANY TEST RESULTS WILL BE SENT TO YOUR REFERRING PHYSICIAN.  You were seen today by Dr. Twana First BE SURE you go to your radiation appointment on 4/25 Follow up in 4 weeks   Thank you for choosing [place] at [facility] to provide your oncology and hematology care.  To afford each patient quality time with our provider, please arrive at least 15 minutes before your scheduled appointment time.    If you have a lab appointment with the [facility] please come in thru the  Main Entrance and check in at the main information desk  You need to re-schedule your appointment should you arrive 10 or more minutes late.  We strive to give you quality time with our providers, and arriving late affects you and other patients whose appointments are after yours.  Also, if you no show three or more times for appointments you may be dismissed from the clinic at the providers discretion.     Again, thank you for choosing [facility].  Our hope is that these requests will decrease the amount of time that you wait before being seen by our physicians.       _____________________________________________________________  Should you have questions after your visit to [facility], please contact our office at [phone] between the hours of 8:30 a.m. and 4:30 p.m.  Voicemails left after 4:30 p.m. will not be returned until the following business day.  For prescription refill requests, have your pharmacy contact our office.       Resources For Cancer Patients and their Caregivers ? American Cancer Society: Can assist with transportation, wigs, general needs, runs Look Good Feel Better.        862-099-9831 ? Cancer Care: Provides financial assistance, online support groups, medication/co-pay assistance.  1-800-813-HOPE 220-508-7822) ? [place] Assists [place] cancer patients and their families through emotional , educational and financial support.  (754)722-4447 ? [place] DSS Where to apply for food stamps, Medicaid and utility assistance. 763-001-7011 ? [facility]: Transportation to medical appointments. 724-349-4546 ? Social Security Administration: May apply for disability if have a Stage IV cancer. 380-745-9292 (715) 269-6783 ? [employer], Disability and Transit Services: Assists with nutrition, care and transit needs. [place] Support Programs: '@10RELATIVEDAYS'$ @ > Cancer Support Group  2nd Tuesday of the month 1pm-2pm, Journey Room  > Creative Journey  3rd Tuesday of the month 1130am-1pm, Journey Room  > Look Good Feel Better  1st Wednesday of the month 10am-12 noon, Journey Room (Call [place] to register [phone])

## 2016-05-27 NOTE — Progress Notes (Signed)
PROGRESS NOTE   Darrell Hilding, MD [address] [place] [address]  DIAGNOSIS: Cancer Staging No matching staging information was found for the patient.   CURRENT THERAPY:  History of present illness:    Squamous cell carcinoma of tongue ([name])   03/14/2016 Miscellaneous    Dr. Benjamine Mola (ENT) consult      03/14/2016 Procedure    Flex laryngoscopy-base of tongue with ulcerative mass in the right side.      03/26/2016 Procedure    Biopsy of right tongue by  Dr. Benjamine Mola      03/27/2016 Pathology Results    Invasive squamous cell carcinoma.      04/02/2016 Miscellaneous    Pathology contacted and requested HPV testing on malignancy specimen.      04/09/2016 Imaging    CT soft tissue neck: IMPRESSION: Enhancing mass base of tongue on the right measures 25 x 27 mm compatible with carcinoma. No pathologic adenopathy in the neck.  Extensive carotid atherosclerotic disease. There is significant carotid stenosis on the left. Carotid duplex or CTA recommended for further evaluation.      04/09/2016 Imaging    CT chest w contrast: IMPRESSION: 1. Irregular spiculated 2.0 cm apical right upper lobe pulmonary nodule, new since 07/31/2006 PET-CT, morphologically suspicious for malignancy, which could represent a metastasis versus a synchronous primary bronchogenic carcinoma. 2. Separate tiny 3 mm solid pulmonary nodule in the right upper lobe, also new since 2008, indeterminate metastasis. 3. Mild right hilar lymphadenopathy, cannot exclude nodal metastasis. 4. Consider PET-CT for further staging evaluation. 5. Mild radiation fibrosis in the medial apical upper lobes bilaterally. 6. Additional findings include aortic atherosclerosis, 2 vessel coronary atherosclerosis and mild centrilobular emphysema.      05/03/2016 PET scan    Hypermetabolic mass (R) base of tongue and lingular tonsil c/w H&N cancer recurrence. No evidence of metastatic adenopathy in neck. Hypermetabolic nodule in RUL  lung measuring 15 mm concerning for pulmonary mets vs primary lung carcinoma (favor primary lung cancer). Additional small sub-5 mm pulmonary nodules are favored to be benign.       04/2016 Pathology Results    Focal staining for p16 positive in approx 5-10% of squamous cells.       05/20/2016 Pathology Results    RUL lung mass biopsy: surgical path squamous cell carcinoma       INTERVAL HISTORY: LELON White is a 66 y.o. 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 Also found to have a 1.5 cm RUL lung mass which was biopsied and came back as SCC. Given lack of nodal disease from the right tongue SCC, suspect this is a lung primary.   He is doing well today. He still has difficulty swallowing. He is still smoking 2 ppd. He continues to have problems swallowing/eating due to his tongue cancer. He complains of headaches on the right side of his head. Denies loss of appetite, abdominal pain, or any other concerns.   MEDICAL HISTORY: Past Medical History:  Diagnosis Date  . Anxiety   . Arthritis   . Cancer ([place])    of mouth  . Depression   . GERD (gastroesophageal reflux disease)   . HOH (hard of hearing)   .  Hypercholesteremia   . Hypertension   . Hypothyroid   . Neuropathy   . Squamous cell carcinoma of tongue ([facility]) 04/02/2016    SURGICAL HISTORY: Past Surgical History:  Procedure Laterality Date  . BACK SURGERY     had total of 7 surgeries with rods and plates  . CATARACT EXTRACTION W/PHACO Left 04/09/2012   Procedure: CATARACT EXTRACTION PHACO AND INTRAOCULAR LENS PLACEMENT (IOC);  Surgeon: Tonny Branch, MD;  Location: [facility];  Service: Ophthalmology;  Laterality: Left;  CDE:  12.44  . CERVICAL DISC SURGERY    . DIRECT LARYNGOSCOPY N/A 03/26/2016   Procedure: DIRECT LARYNGOSCOPY;  Surgeon: Leta Baptist, MD;  Location: [facility];  Service: ENT;  Laterality: N/A;  . EXCISION OF TONGUE LESION Right 03/26/2016   Procedure: BIOPSY OF RIGHT TONGUE BASE MASS;  Surgeon: Leta Baptist, MD;  Location: [facility];  Service: ENT;  Laterality: Right;  . KNEE ARTHROSCOPY     right knee  . MOUTH SURGERY     removal of cancer  . OTHER SURGICAL HISTORY     insertion of pain pump  . SHOULDER ARTHROSCOPY     bilateral  . SPINAL FUSION    . SPINE SURGERY     insertion of morphine pump into spine    SOCIAL HISTORY: Social History   Social History  . Marital status: Married    Spouse name: N/A  . Number of children: N/A  . Years of education: N/A   Occupational History  . Not on file.   Social History Main Topics  . Smoking status: Current Every Day Smoker    Packs/day: 3.00    Years: 55.00    Types: Cigarettes  . Smokeless tobacco: Never Used  . Alcohol use No     Comment: H/O EtOHism drinking heavy- boubon/beer, quit ~ 2000  . Drug use: No  . Sexual activity: Yes    Birth control/ protection: None   Other Topics Concern  . Not on file   Social History Narrative  . No narrative on file    FAMILY HISTORY: Family History  Problem Relation Age of Onset  . Cancer Father   . Cancer Brother     Prostate cancer   Review of Systems  Constitutional: Negative.        No loss of appetite  HENT:       Dysphagia  Eyes: Negative.   Respiratory: Negative.   Cardiovascular: Negative.   Gastrointestinal: Negative.  Negative for abdominal pain.  Genitourinary: Negative.   Musculoskeletal: Negative.   Skin: Negative.   Neurological: Positive for headaches (R sided).  Endo/Heme/Allergies: Negative.   Psychiatric/Behavioral: Negative.   All other systems reviewed and are negative. 14 point review of systems was performed and is negative  except as detailed under history of present illness and above  PHYSICAL EXAMINATION ECOG PERFORMANCE STATUS: 1 - Symptomatic but completely ambulatory  Vitals:   05/27/16 1033  BP: 138/66  Pulse: (!) 59  Resp: 20  Temp: 98.7 F (37.1 C)   Physical Exam  Constitutional: He is oriented to person, place, and time and well-developed, well-nourished, and in no distress.  HENT:  Head: Normocephalic and atraumatic.  Mouth/Throat: Oropharynx is clear and moist.  Eyes: Conjunctivae and EOM are normal. Pupils are equal, round, and reactive to light.  Neck: Normal range of motion. Neck supple.  Cardiovascular: Normal rate, regular rhythm and normal heart sounds.   Pulmonary/Chest: Effort normal and breath sounds normal.  Abdominal:  Soft. Bowel sounds are normal.  Musculoskeletal: Normal range of motion.  Neurological: He is alert and oriented to person, place, and time. Gait normal.  Skin: Skin is warm and dry.  Nursing note and vitals reviewed.  LABORATORY DATA:  CBC    Component Value Date/Time   WBC 7.1 05/20/2016 1043   RBC 4.47 05/20/2016 1043   HGB 13.3 05/20/2016 1043   HCT 40.5 05/20/2016 1043   PLT 195 05/20/2016 1043   MCV 90.6 05/20/2016 1043   MCH 29.8 05/20/2016 1043   MCHC 32.8 05/20/2016 1043   RDW 14.0 05/20/2016 1043   LYMPHSABS 1.3 04/02/2016 1554   MONOABS 0.8 04/02/2016 1554   EOSABS 0.2 04/02/2016 1554   BASOSABS 0.0 04/02/2016 1554   CMP     Component Value Date/Time   NA 137 04/02/2016 1554   K 4.8 04/02/2016 1554   CL 101 04/02/2016 1554   CO2 30 04/02/2016 1554   GLUCOSE 95 04/02/2016 1554   BUN 13 04/02/2016 1554   CREATININE 0.92 04/02/2016 1554   CALCIUM 9.3 04/02/2016 1554   PROT 7.2 04/02/2016 1554   ALBUMIN 4.3 04/02/2016 1554   AST 17 04/02/2016 1554   ALT 10 (L) 04/02/2016 1554   ALKPHOS 39 04/02/2016 1554   BILITOT 0.4 04/02/2016 1554   GFRNONAA >60 04/02/2016 1554   GFRAA >60 04/02/2016 1554    RADIOGRAPHIC STUDIES: I have  personally reviewed the radiological images as listed and agreed with the findings in the report.  PET Scan 05/03/2016 IMPRESSION: 1. Hypermetabolic mass at the RIGHT base of tongue and lingular tonsil consistent with head and neck carcinoma recurrence. 2. No evidence of metastatic adenopathy in the neck. 3. Hypermetabolic nodule in the superior segment of the RIGHT lung is concerning for pulmonary metastasis versus primary lung carcinoma. Favor primary lung cancer. 4. Additional small sub 5 mm pulmonary nodules are favored benign.  PATHOLOGY:   ASSESSMENT and THERAPY PLAN:  Squamous cell carcinoma of tongue ([facility]) Invasive squamous cell carcinoma of right tongue, status post biopsy by Dr. Benjamine Mola on 03/26/2016.  He has a spiculated 2 cm mass in the RUL and also some subcentimeter nodules in the RUL. Biopsy of RUL mass demonstrated squamous cell carcinoma.   Given lack of hypermetabolic neck nodal disease on PET, I suspect that this RUL mass is likely a second primary rather than metastatic disease from his tongue SCC. He likely has 2 early stage SCCs which can be treated with RT.  PLAN: I have discussed patient's PET and biopsy results in detail with him and his wife today. I have discussed with them that he likely has 2 early stage SCCs which can be treated with RT.   Patient's case with discussed with rad-onc Dr. Quitman Livings, he will evaluate him and may consider sending him to [facility] for consideration for SBRT or radiosurgery of lung lesion.  We defer to his medical recommendation. Patient has appointment with rad-onc on 05/29/16.  We discussed smoking cessation. He is smoking 2 ppd. He agrees to try to cut back.   Proceed with appointment with speech pathology tomorrow.  He will return for follow up in 4 weeks.    All questions were answered. The patient knows to call the clinic with any problems, questions or concerns. We can certainly see the patient much  sooner if necessary.  This document serves as a record of services personally performed by Twana First, MD. It was created on her behalf by Martinique Casey,  a trained medical scribe. The creation of this record is based on the scribe's personal observations and the provider's statements to them. This document has been checked and approved by the attending provider.  I have reviewed the above documentation for accuracy and completeness and I agree with the above.  This note was electronically signed. Martinique Sanda Klein 05/27/2016

## 2016-05-27 NOTE — Telephone Encounter (Signed)
Patient have a appt with his cancer Dr and need to change his appt time

## 2016-05-28 ENCOUNTER — Ambulatory Visit (HOSPITAL_COMMUNITY): Payer: PPO | Admitting: Speech Pathology

## 2016-05-28 DIAGNOSIS — F17200 Nicotine dependence, unspecified, uncomplicated: Secondary | ICD-10-CM | POA: Diagnosis not present

## 2016-05-28 DIAGNOSIS — Z809 Family history of malignant neoplasm, unspecified: Secondary | ICD-10-CM | POA: Diagnosis not present

## 2016-05-28 DIAGNOSIS — Z8042 Family history of malignant neoplasm of prostate: Secondary | ICD-10-CM | POA: Diagnosis not present

## 2016-05-28 DIAGNOSIS — C01 Malignant neoplasm of base of tongue: Secondary | ICD-10-CM | POA: Diagnosis not present

## 2016-05-28 DIAGNOSIS — C3411 Malignant neoplasm of upper lobe, right bronchus or lung: Secondary | ICD-10-CM | POA: Diagnosis not present

## 2016-05-29 ENCOUNTER — Encounter: Payer: Self-pay | Admitting: Radiation Oncology

## 2016-05-29 NOTE — Progress Notes (Addendum)
  Radiation Oncology         [phone]) [phone] ________________________________  Name: Darrell White MRN: [id number]  Date: 05/29/2016  DOB: [birth year]  Chart Note:  Today I spoke with Dr. Corrie Dandy from the rad-onc clinic at [facility]  This very nice 66 yo former patient of mine was seen there for a new early stage Base of Tongue primary cancer as well as a new right upper lung cancer.  The RUL cancer appears amenable to SBRT while the BOT lesion is better suited for fractionated re-irradiation.    Today we discussed coordinating SBRT here at [facility] with external radiation in [place].  I will see the patient next week to discuss further.   ________________________________  Sheral Apley. Tammi Klippel, M.D.

## 2016-05-31 ENCOUNTER — Encounter (HOSPITAL_COMMUNITY): Payer: PPO

## 2016-05-31 ENCOUNTER — Encounter (HOSPITAL_COMMUNITY): Payer: Self-pay

## 2016-05-31 NOTE — Progress Notes (Signed)
Nutrition  Patient was a no show for nutrition appointment today.    Abigaile Rossie B. Junita Kubota, RD, LDN Registered Dietitian [phone] (pager)   

## 2016-06-03 ENCOUNTER — Encounter (HOSPITAL_COMMUNITY): Payer: Self-pay | Admitting: Speech Pathology

## 2016-06-03 ENCOUNTER — Ambulatory Visit (HOSPITAL_COMMUNITY): Payer: PPO | Admitting: Speech Pathology

## 2016-06-03 DIAGNOSIS — R1312 Dysphagia, oropharyngeal phase: Secondary | ICD-10-CM | POA: Diagnosis not present

## 2016-06-03 NOTE — Therapy (Signed)
[facility], [place], [address] Phone: [phone]   Fax:  [phone]  Speech Language Pathology Evaluation  Patient Details  Name: Darrell White MRN: [id number] Date of Birth: [birth year] No Data Recorded  Encounter Date: 06/03/2016      End of Session - 06/03/16 2342    Visit Number 1   Number of Visits 6   Authorization Type [employer]   SLP Start Time 6962   SLP Stop Time  1430   SLP Time Calculation (min) 35 min   Activity Tolerance Patient tolerated treatment well      Past Medical History:  Diagnosis Date  . Anxiety   . Arthritis   . Cancer ([facility])    right base of tongue  . Depression   . GERD (gastroesophageal reflux disease)   . HOH (hard of hearing)   . Hypercholesteremia   . Hypertension   . Hypothyroid   . Lung cancer ([place] [place])    right upper lobe non small cell lung cancer  . Neuropathy   . Squamous cell carcinoma of tongue ([facility]) 04/02/2016    Past Surgical History:  Procedure Laterality Date  . BACK SURGERY     had total of 7 surgeries with rods and plates  . CATARACT EXTRACTION W/PHACO Left 04/09/2012   Procedure: CATARACT EXTRACTION PHACO AND INTRAOCULAR LENS PLACEMENT (IOC);  Surgeon: Tonny Branch, MD;  Location: [facility];  Service: Ophthalmology;  Laterality: Left;  CDE: 12.44  . CERVICAL DISC SURGERY    . DIRECT LARYNGOSCOPY N/A 03/26/2016   Procedure: DIRECT LARYNGOSCOPY;  Surgeon: Leta Baptist, MD;  Location: [facility];  Service: ENT;  Laterality: N/A;  . EXCISION OF TONGUE LESION Right 03/26/2016   Procedure: BIOPSY OF RIGHT TONGUE BASE MASS;  Surgeon: Leta Baptist, MD;  Location: [facility];  Service: ENT;  Laterality: Right;  . KNEE ARTHROSCOPY     right knee  . MOUTH SURGERY     removal of cancer  . OTHER SURGICAL HISTORY     insertion of pain pump  . SHOULDER ARTHROSCOPY     bilateral  . SPINAL FUSION    . SPINE SURGERY     insertion of morphine  pump into spine    There were no vitals filed for this visit.      Subjective Assessment - 06/03/16 2339    Subjective "I'm not sure why I am here."   Currently in Pain? No/denies          Prior Functional Status - 06/03/16 2340      Prior Functional Status   Cognitive/Linguistic Baseline Within functional limits    Lives With Spouse         General - 06/03/16 2340      General Information   Date of Onset 05/05/16   HPI Darrell White is a 65 y.o. male seen at the request of Dr. Talbert Cage for a swallowing evaluation given newly diagnosed SCC right tongue and tonsillar pillar and right upper lobe NSCLC, squamous cell carcinoma, He has a history of left sided tongue/lip SCC treated with resection and EBRT 10+ years ago.  Recently, he presented to ENT on 03/14/2016 for evaluation of new onset of burning sensation at the base of his tongue and throat accompanied by dysphagia with the sensation of choking frequently over the previous 3 months.  Flex laryngoscopy in the office revealed an ulcerative mass at the base of the right tongue  anterior to the tonsillar pillar, suspicious for SCC. No other suspicious masses or lesions were noted on fiberoptic laryngoscopy in the office.  Patient underwent direct laryngoscopy for biopsy of the right base of tongue lesion on 03/26/16.  Pathology revealed invasive SCC.  The mass measured 25 x 27 mm on CT soft tissue neck 04/09/16.   Type of Study Bedside Swallow Evaluation   Diet Prior to this Study Regular;Thin liquids   Temperature Spikes Noted No   Respiratory Status Room air   History of Recent Intubation No   Behavior/Cognition Alert;Cooperative;Pleasant mood   Oral Cavity Assessment Within Functional Limits   Oral Care Completed by SLP No   Oral Cavity - Dentition Edentulous   Vision Functional for self-feeding   Self-Feeding Abilities Able to feed self   Patient Positioning Upright in chair   Baseline Vocal Quality Normal   Volitional Cough  Strong   Volitional Swallow Able to elicit          Oral Motor/Sensory Function - 06/03/16 2341      Oral Motor/Sensory Function   Overall Oral Motor/Sensory Function Within functional limits  reduced lingual ROM due to previous surgery         Ice Chips - 06/03/16 2341      Ice Chips   Ice chips Not tested         Thin Liquid - 06/03/16 2341      Thin Liquid   Thin Liquid Within functional limits   Presentation Cup;Self Fed         Nectar thick liquid - 06/03/16 2341      Nectar Thick Liquid   Nectar Thick Liquid Not tested          Puree - 06/03/16 2341      Puree   Puree Within functional limits   Presentation Self Fed;Spoon         Solid - 06/03/16 2341      Solid   Solid Within functional limits   Presentation Self Fed             SLP Education - 06/03/16 2339    Education provided Yes   Education Details signs and symptoms of aspiration; dysphagia information related to [facility]   Person(s) Educated Patient   Methods Explanation;Handout;Demonstration;Tactile cues   Comprehension Verbalized understanding;Returned demonstration;Verbal cues required;Need further instruction          SLP Short Term Goals - 06/03/16 2345      SLP SHORT TERM GOAL #1   Title Pt will demonstrate safe and efficient consumption of self regulated regular textures with thin liquids with use of strategies as needed.   Period Months   Status New     SLP SHORT TERM GOAL #2   Title Pt will complete pharyngeal swallowing exercises as assigned with use of written cue after initial introduction/model from SLP   Period Months   Status New     SLP SHORT TERM GOAL #3    Pt will verbalize 3 signs/symptoms of aspiration pneumonia with min assist.        Plan - 06/03/16 2343    Clinical Impression Statement Clinical swallow assessment completed. Pt does report some odynophagia with swallow (with and without po). Recommend self regulated regular textures and thin  liquids. Pt was provided with list of soft food options and he was able to select many that he thought he could try. Pt without overt signs of aspiration this date, however SLP discussed obtaining objective assessment (  MBSS) if needed. He is not yet sure of his treatment plan so we made a follow up appointment here in one month. He was instructed to call sooner if swallow function declines.  Because data states the risk for dysphagia during and after radiation treatment is high due to undergoing radiation tx, SLP taught pt about the possibility of reduced/limited ability for PO intake during rad tx. SLP encouraged pt to continue swallowing POs as far into rad tx as possible, even ingesting POs and/or completing HEP shortly after administration of pain meds.   SLP educated pt re: changes to swallowing musculature after rad tx, and why adherence to dysphagia HEP provided today and PO consumption was necessary to inhibit muscular disuse atrophy and manage the effects of radiation fibrosis following radiation tx. Further education was provided regarding possible acute and late effects of radiation therapy including: xerostomia, dysgeusia, salivary changes, mucositis/esophagitis, dehydration, weight loss, fatigue, dysphagia, trismus, and lymphedema. It is prudent that patient is followed by a dentist to reduce risk of cavities, infection, osteoradionecrosis, or other oral issues.    SLP then developed a HEP for pt and pt was instructed how to perform exercises involving lingual, vocal, and pharyngeal strengthening. SLP performed each exercise and pt return demonstrated each exercise. SLP ensured pt performance was correct prior to moving on to next exercise. Pt was instructed to complete this program 2-3 times a day, 6-7 days/week until 6 months after his or her last rad tx, then x2-3 a week after that.   Speech Therapy Frequency Monthly   Duration --  ~4-6 months   Treatment/Interventions Aspiration precaution  training;Pharyngeal strengthening exercises;Diet toleration management by SLP;Compensatory techniques;Patient/family education;SLP instruction and feedback;Oral motor exercises   Potential to Achieve Goals Good   Potential Considerations Medical prognosis   SLP Home Exercise Plan Pt will be independent with HEP as assigned to facilitate carryover of treatment strategies and techniques in home and community environment.   Consulted and Agree with Plan of Care Patient      Patient will benefit from skilled therapeutic intervention in order to improve the following deficits and impairments:   Dysphagia, oropharyngeal phase      G-Codes - Jun 23, 2016 05-05-17    Functional Assessment Tool Used clinical judgment   Functional Limitations Swallowing   Swallow Current Status (V4008) At least 20 percent but less than 40 percent impaired, limited or restricted   Swallow Goal Status (Q7619) At least 1 percent but less than 20 percent impaired, limited or restricted      Problem List Patient Active Problem List   Diagnosis Date Noted  . Primary squamous cell carcinoma of right lung ([facility]) 06/04/2016  . Squamous cell carcinoma of tongue ([facility]) 04/02/2016  . Presence of implanted infusion pump 09/29/2012   Thank you,  Genene Churn, [place]   [facility] 23-Jun-2016, 11:20 PM  [facility] [address] [place], [place], [address] Phone: [phone]   Fax:  [phone]  Name: Darrell White MRN: [id number] Date of Birth: [birth year]

## 2016-06-04 ENCOUNTER — Encounter: Payer: Self-pay | Admitting: Radiation Oncology

## 2016-06-04 DIAGNOSIS — C3411 Malignant neoplasm of upper lobe, right bronchus or lung: Secondary | ICD-10-CM | POA: Insufficient documentation

## 2016-06-04 NOTE — Progress Notes (Addendum)
Thoracic Location of Tumor / Histology: Squamous cell carcinoma of base of  tongue, right and squamous cell carcinoma of right lung.  Patient presented a history of left oral tongue squamous cell carcinoma. In early 2018 he noted a sore throat and difficulty swallowing with pain.   Biopsies of RUL lung lesion (if applicable) revealed:    Tobacco/Marijuana/Snuff/ETOH use: current everyday smoker 1 ppd (down from 2 ppd)  Past/Anticipated interventions by cardiothoracic surgery, if any: no  Past/Anticipated interventions by medical oncology, if any: observation  Signs/Symptoms  Weight changes, if any: denies  Respiratory complaints, if any: Denies shortness of breath. Reports a smokers dry cough.Reports continued sore throat and pain/difficulty associated with swallowing  Hemoptysis, if any: denies  Pain issues, if any:  Denies chest pain. Reports his tongue is constant sore and he has persistent headaches. Reports his overall pain is an 8 on a scale of 0-10. Reports headaches often keep him awake at night. Reports alternating oxycodone 20 mg every four hours with advil to manage pain.   SAFETY ISSUES:  Prior radiation? yes, post adjuvant radiotherapy in 2007  Pacemaker/ICD? no   Possible current pregnancy?no  Is the patient on methotrexate? no  Current Complaints / other details:  66 year old male. Married. Lives in [place]. Referred to Dr. Tammi Klippel to discuss SBRT to RUL lung mass. Reports he has not began radiation to the tongue yet. Reports eating a soft diet due to tongue pain.

## 2016-06-05 ENCOUNTER — Ambulatory Visit
Admission: RE | Admit: 2016-06-05 | Discharge: 2016-06-05 | Disposition: A | Discharge status: Home / Self Care | Payer: PPO | Source: Ambulatory Visit | Attending: Radiation Oncology | Admitting: Radiation Oncology

## 2016-06-05 ENCOUNTER — Encounter: Payer: Self-pay | Admitting: Radiation Oncology

## 2016-06-05 VITALS — BP 112/60 | HR 57 | Temp 97.7°F | Resp 16 | Ht 65.0 in | Wt 204.0 lb

## 2016-06-05 DIAGNOSIS — E039 Hypothyroidism, unspecified: Secondary | ICD-10-CM | POA: Diagnosis not present

## 2016-06-05 DIAGNOSIS — I10 Essential (primary) hypertension: Secondary | ICD-10-CM | POA: Diagnosis not present

## 2016-06-05 DIAGNOSIS — F17210 Nicotine dependence, cigarettes, uncomplicated: Secondary | ICD-10-CM | POA: Diagnosis not present

## 2016-06-05 DIAGNOSIS — F419 Anxiety disorder, unspecified: Secondary | ICD-10-CM | POA: Insufficient documentation

## 2016-06-05 DIAGNOSIS — Z923 Personal history of irradiation: Secondary | ICD-10-CM | POA: Insufficient documentation

## 2016-06-05 DIAGNOSIS — C3411 Malignant neoplasm of upper lobe, right bronchus or lung: Secondary | ICD-10-CM | POA: Insufficient documentation

## 2016-06-05 DIAGNOSIS — D381 Neoplasm of uncertain behavior of trachea, bronchus and lung: Secondary | ICD-10-CM

## 2016-06-05 DIAGNOSIS — F329 Major depressive disorder, single episode, unspecified: Secondary | ICD-10-CM | POA: Diagnosis not present

## 2016-06-05 DIAGNOSIS — K219 Gastro-esophageal reflux disease without esophagitis: Secondary | ICD-10-CM | POA: Diagnosis not present

## 2016-06-05 DIAGNOSIS — G629 Polyneuropathy, unspecified: Secondary | ICD-10-CM | POA: Insufficient documentation

## 2016-06-05 DIAGNOSIS — C029 Malignant neoplasm of tongue, unspecified: Secondary | ICD-10-CM | POA: Insufficient documentation

## 2016-06-05 DIAGNOSIS — E7800 Pure hypercholesterolemia, unspecified: Secondary | ICD-10-CM | POA: Insufficient documentation

## 2016-06-05 DIAGNOSIS — C01 Malignant neoplasm of base of tongue: Secondary | ICD-10-CM | POA: Diagnosis not present

## 2016-06-05 HISTORY — DX: Malignant neoplasm of unspecified part of unspecified bronchus or lung: C34.90

## 2016-06-05 NOTE — Progress Notes (Signed)
See progress note under physician encounter. 

## 2016-06-05 NOTE — Progress Notes (Signed)
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 Primary cancer of right upper lobe of lung (HCC) 162.3 C34.11   2. Squamous cell carcinoma of tongue (HCC) 141.9 C02.9     HISTORY OF PRESENT ILLNESS: Darrell White is a 65 y.o. male seen at the request of Dr. Talbert Cage for a newly diagnosed right upper lobe NSCLC, squamous cell carcinoma.  He has a history of left sided tongue/lip SCC treated with resection and EBRT in 2007.  Recently, he presented to ENT on 03/14/2016 for evaluation of new onset of burning sensation at the base of his tongue and throat accompanied by dysphagia with the sensation of choking frequently over the previous 3 months.  Flex laryngoscopy in the office revealed an ulcerative mass at the base of the right tongue anterior to the tonsillar pillar, suspicious for SCC. No other suspicious masses or lesions were noted on fiberoptic laryngoscopy in the office.  Patient underwent direct laryngoscopy for biopsy of the right base of tongue lesion on 03/26/16.  Pathology revealed invasive SCC.  The mass measured 25 x 27 mm on CT soft tissue neck 04/09/16.   Staging CT chest on 04/09/16 revealed an irregular spiculated 2.0 x 1.7 cm apical right upper lobe pulmonary nodule and a 28m solid anterior right upper lobe pulmonary nodule, new since 07/31/2006.  There was a mildly enlarged 1.1 cm right hilar lymph node.  PET CT was performed on 05/03/16 and revealed a hypermetabolic thickening at the RIGHT base of tongue and lingular tonsil with SUV max equal 20.0.and a 152mRUL nodule with SUV max 4.3.  Small 34m9might subpleural nodule in RUL and small  3mm634mL nodule were not hypermetabolic. No hypermetabolic lymph nodes in the abdomen or pelvis. No evidence of metastatic activity in the neck.  CT guided core biopsy of the RUL pulmonary nodule was performed 05/20/16 with pathology positive for SCC.  The patient has met with Dr. ZhouTalbert Cage Dr[facility] [name] is interested in having EBRT in [place] treatment of the base of tongue SCC but is referred to our office today to discuss the potential role for SBRT in the treatment of his RUL SCC.  Pt does report he got "plain sick" during RT in 2007, citing inability to eat.    PREVIOUS RADIATION THERAPY: Yes- Left Base of Tongue lesion treated in 2007  PAST MEDICAL HISTORY:  Past Medical History:  Diagnosis Date  . Anxiety   . Arthritis   . Cancer (HCC)[name] right base of tongue  . Depression   . GERD (gastroesophageal reflux disease)   . HOH (hard of hearing)   . Hypercholesteremia   . Hypertension   . Hypothyroid   . Lung cancer (HCC)[place] right upper lobe non small cell lung cancer  . Neuropathy   . Squamous cell carcinoma of tongue (HCC)[place]27/2018      PAST SURGICAL HISTORY: Past Surgical History:  Procedure Laterality Date  . BACK SURGERY     had total of 7 surgeries with rods and plates  .  CATARACT EXTRACTION W/PHACO Left 04/09/2012   Procedure: CATARACT EXTRACTION PHACO AND INTRAOCULAR LENS PLACEMENT (IOC);  Surgeon: Tonny Branch, MD;  Location: [facility];  Service: Ophthalmology;  Laterality: Left;  CDE: 12.44  . CERVICAL DISC SURGERY    . DIRECT LARYNGOSCOPY N/A 03/26/2016   Procedure: DIRECT LARYNGOSCOPY;  Surgeon: Leta Baptist, MD;  Location: [place];  Service: ENT;  Laterality: N/A;  . EXCISION OF TONGUE LESION Right 03/26/2016   Procedure: BIOPSY OF RIGHT TONGUE BASE MASS;  Surgeon: Leta Baptist, MD;  Location: [place];  Service: ENT;  Laterality: Right;  . KNEE ARTHROSCOPY     right knee  . MOUTH SURGERY     removal of  cancer  . OTHER SURGICAL HISTORY     insertion of pain pump  . SHOULDER ARTHROSCOPY     bilateral  . SPINAL FUSION    . SPINE SURGERY     insertion of morphine pump into spine    FAMILY HISTORY:  Family History  Problem Relation Age of Onset  . Cancer Father   . Cancer Brother     Prostate cancer    SOCIAL HISTORY:  Social History   Social History  . Marital status: Married    Spouse name: N/A  . Number of children: N/A  . Years of education: N/A   Occupational History  . Not on file.   Social History Main Topics  . Smoking status: Current Every Day Smoker    Packs/day: 1.00    Years: 55.00    Types: Cigarettes  . Smokeless tobacco: Never Used  . Alcohol use No     Comment: H/O EtOHism drinking heavy- boubon/beer, quit ~ 2000  . Drug use: No  . Sexual activity: Yes    Birth control/ protection: None   Other Topics Concern  . Not on file   Social History Narrative  . No narrative on file    ALLERGIES: Patient has no known allergies.  MEDICATIONS:  Current Outpatient Prescriptions  Medication Sig Dispense Refill  . atorvastatin (LIPITOR) 20 MG tablet Take 20 mg by mouth daily.     Mariane Baumgarten Calcium (STOOL SOFTENER PO) Take 1 tablet by mouth daily as needed (constipation).    . gabapentin (NEURONTIN) 300 MG capsule Take 600 mg by mouth daily at 12 noon.     Marland Kitchen levothyroxine (SYNTHROID, LEVOTHROID) 75 MCG tablet Take 75 mcg by mouth daily before breakfast.    . metoprolol succinate (TOPROL-XL) 25 MG 24 hr tablet Take 25 mg by mouth at bedtime.    . mirtazapine (REMERON) 45 MG tablet Take 45 mg by mouth at bedtime.    . naproxen sodium (ANAPROX) 220 MG tablet Take 440 mg by mouth 2 (two) times daily as needed (pain).     Marland Kitchen omeprazole (PRILOSEC) 20 MG capsule Take 20 mg by mouth daily as needed (heartburn).    Marland Kitchen OVER THE COUNTER MEDICATION Take 1,000 mg by mouth 3 (three) times daily. Hemp oil 1000 mg each    . Oxycodone HCl 20 MG TABS Take 20 mg by mouth 4  (four) times daily.    . vitamin B-12 (CYANOCOBALAMIN) 1000 MCG tablet Take 2,000 mcg by mouth every evening.     No current facility-administered medications for this encounter.     REVIEW OF SYSTEMS:  On review of systems, the patient reports that he is doing well overall. He denies any chest pain, shortness of breath, fevers, chills, night sweats, unintended weight  changes. He endorses a productive cough with tan phlegm that is not yellow, green, or bloody. He also continues to endorse choking sensations and sore throat as described in HPI. He denies any bladder disturbances, and denies abdominal pain, nausea or vomiting. Pt endorses constipation and treats this with stool softener daily. He denies any new musculoskeletal or joint aches or pains. Pt denies swelling in the LEs or use of oxygen at home. A complete review of systems is obtained and is otherwise negative.    PHYSICAL EXAM:  Wt Readings from Last 3 Encounters:  06/05/16 204 lb (92.5 kg)  05/27/16 204 lb 9.6 oz (92.8 kg)  05/20/16 200 lb (90.7 kg)   Temp Readings from Last 3 Encounters:  06/05/16 97.7 F (36.5 C) (Oral)  05/27/16 98.7 F (37.1 C) (Oral)  05/20/16 98 F (36.7 C) (Oral)   BP Readings from Last 3 Encounters:  06/05/16 112/60  05/27/16 138/66  05/20/16 (!) 148/75   Pulse Readings from Last 3 Encounters:  06/05/16 (!) 57  05/27/16 (!) 59  05/20/16 (!) 46   Pain Assessment Pain Score: 8  (tongue and headache)/10  In general this is a well appearing caucasian male in no acute distress. He is alert and oriented x4 and appropriate throughout the examination. HEENT reveals that the patient is normocephalic, atraumatic. EOMs are intact. PERRLA. Skin is intact without any evidence of gross lesions. Cardiovascular exam reveals a regular rate and rhythm, no clicks rubs or murmurs are auscultated. Wheezing noted in lungs bilaterally. Lymphatic assessment is performed and does not reveal any adenopathy in the  cervical, supraclavicular, axillary, or inguinal chains. Abdomen has active bowel sounds in all quadrants and is intact. The abdomen is soft, non tender, non distended. Lower extremities are negative for pretibial pitting edema, deep calf tenderness, cyanosis or clubbing.    KPS = 100  100 - Normal; no complaints; no evidence of disease. 90   - Able to carry on normal activity; minor signs or symptoms of disease. 80   - Normal activity with effort; some signs or symptoms of disease. 25   - Cares for self; unable to carry on normal activity or to do active work. 60   - Requires occasional assistance, but is able to care for most of his personal needs. 50   - Requires considerable assistance and frequent medical care. 60   - Disabled; requires special care and assistance. 48   - Severely disabled; hospital admission is indicated although death not imminent. 38   - Very sick; hospital admission necessary; active supportive treatment necessary. 10   - Moribund; fatal processes progressing rapidly. 0     - Dead  Karnofsky DA, Abelmann [place], Craver LS and Burchenal [facility] [phone]) The use of the nitrogen mustards in the palliative treatment of carcinoma: with particular reference to bronchogenic carcinoma Cancer 1 634-56  LABORATORY DATA:  Lab Results  Component Value Date   WBC 7.1 05/20/2016   HGB 13.3 05/20/2016   HCT 40.5 05/20/2016   MCV 90.6 05/20/2016   PLT 195 05/20/2016   Lab Results  Component Value Date   NA 137 04/02/2016   K 4.8 04/02/2016   CL 101 04/02/2016   CO2 30 04/02/2016   Lab Results  Component Value Date   ALT 10 (L) 04/02/2016   AST 17 04/02/2016   ALKPHOS 39 04/02/2016   BILITOT 0.4 04/02/2016     RADIOGRAPHY: Ct Biopsy  Result Date: 05/20/2016 INDICATION: 66 year old male with a history  of squamous cell carcinoma of the right tongue base and a right apical hypermetabolic pulmonary nodule which may represent metastatic disease or a metachronous lung primary  malignancy. He presents for CT-guided biopsy for tissue diagnosis. EXAM: CT-guided biopsy right upper lobe lobe pulmonary nodule Interventional Radiologist:  Criselda Peaches, MD MEDICATIONS: None. ANESTHESIA/SEDATION: Fentanyl 200 mcg IV; Versed 4 mg IV Moderate Sedation Time:  30 minutes The patient was continuously monitored during the procedure by the interventional radiology nurse under my direct supervision. FLUOROSCOPY TIME:  Fluoroscopy Time: 0 minutes 0 seconds (0 mGy). COMPLICATIONS: None immediate. Estimated blood loss:  0 PROCEDURE: Informed written consent was obtained from the patient after a thorough discussion of the procedural risks, benefits and alternatives. All questions were addressed. Maximal Sterile Barrier Technique was utilized including caps, mask, sterile gowns, sterile gloves, sterile drape, hand hygiene and skin antiseptic. A timeout was performed prior to the initiation of the procedure. A planning axial CT scan was performed. The nodule in the right lung apex was successfully identified. A suitable skin entry site was selected and marked. The region was then sterilely prepped and draped in standard fashion using Betadine skin prep. Local anesthesia was attained by infiltration with 1% lidocaine. A small dermatotomy was made. Under intermittent CT fluoroscopic guidance, a 17 gauge trocar needle was advanced into the lung and positioned at the margin of the nodule. Multiple 18 gauge core biopsies were then coaxially obtained using the BioPince automated biopsy device. Biopsy specimens were placed in formalin and delivered to pathology for further analysis. The biopsy device and introducer needle were removed. A bio centri device was deployed. Post biopsy axial CT imaging demonstrates no evidence of immediate complication. There is no pneumothorax. Moderate perilesional alveolar hemorrhage is not unexpected. The patient tolerated the procedure well. IMPRESSION: Technically successful  CT-guided biopsy right apical pulmonary nodule. Signed, Criselda Peaches, MD [facility] Radiology Electronically Signed   By: Jacqulynn Cadet M.D.   On: 05/20/2016 16:43   Dg Chest Port 1 View  Result Date: 05/20/2016 CLINICAL DATA:  Status post biopsy of right upper lobe lesion today. EXAM: PORTABLE CHEST 1 VIEW COMPARISON:  CT chest 04/09/2016. FINDINGS: No pneumothorax after biopsy of a right apical lesion. The lungs are emphysematous. Left lung is clear. No pleural effusion. Heart size is normal. IMPRESSION: Negative for pneumothorax after biopsy of a right apical nodule. Electronically Signed   By: Inge Rise M.D.   On: 05/20/2016 16:10      IMPRESSION/PLAN: 1. 66 y.o. with Stage 1A, T1aN0M0, NSCLC, squamous cell carcinoma of the RUL and a Stage I SCC of the right base of tongue.   Today, we talked to the patient about the findings and workup thus far. We discussed the natural history of squamous cell carcinoma and general treatment, highlighting the role of radiotherapy in the management.  He has previously met with Dr. Quitman Livings at [facility] to discuss EBRT for treatment of the base of tongue lesion.  We discussed the available radiation techniques, and focused on the details of logistics and delivery of SBRT for treatment of the RUL mass. We reviewed the anticipated acute and late sequelae associated with radiation in this setting. The patient was encouraged to ask questions that were answered to his satisfaction. He is interested in having the base of tongue lesion treated with EBRT in [place]. He is interested in proceeding with SBRT of the RUL mass here in [place].  He is scheduled for  CT simulation tomorrow at [place], PA-C    [name], MD  [place]: [phone]  Fax: [phone] [email or web address]  Skype  [email or web address]    This document serves as a record of  services personally performed by [name], MD and Freeman Caldron, PA-C. It was created on their behalf by Linward Natal, a trained medical scribe. The creation of this record is based on the scribe's personal observations and the provider's statements to them. This document has been checked and approved by the attending provider.

## 2016-06-06 ENCOUNTER — Ambulatory Visit
Admission: RE | Admit: 2016-06-06 | Discharge: 2016-06-06 | Disposition: A | Discharge status: Home / Self Care | Payer: PPO | Source: Ambulatory Visit | Attending: Radiation Oncology | Admitting: Radiation Oncology

## 2016-06-06 DIAGNOSIS — C3411 Malignant neoplasm of upper lobe, right bronchus or lung: Secondary | ICD-10-CM | POA: Diagnosis not present

## 2016-06-06 NOTE — Progress Notes (Addendum)
  Radiation Oncology         [phone] ________________________________  Name: Darrell White MRN: [id number]  Date: 06/06/2016  DOB: [birth year]  STEREOTACTIC BODY RADIOTHERAPY SIMULATION AND TREATMENT PLANNING NOTE    ICD-9-CM ICD-10-CM   1. 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  Identity was confirmed.  All relevant records and images related to the planned course of therapy were reviewed.  The patient freely provided informed written consent to proceed with treatment after reviewing the details related to the planned course of therapy. The consent form was witnessed and verified by the simulation staff.  Then, the patient was set-up in a stable reproducible  supine position for radiation therapy.  A BodyFix immobilization pillow was fabricated for reproducible positioning.  Then I personally applied the abdominal compression paddle to limit respiratory excursion.  4D respiratoy motion management CT images were obtained.  Surface markings were placed.  The CT images were loaded into the planning software.  Then, using Cine, MIP, and standard views, the internal target volume (ITV) and planning target volumes (PTV) were delinieated, and avoidance structures were contoured.  Treatment planning then occurred.  The radiation prescription was entered and confirmed.  A total of two complex treatment devices were fabricated in the form of the BodyFix immobilization pillow and a neck accuform cushion.  I have requested : 3D Simulation  I have requested a DVH of the following structures: Heart, Lungs, Esophagus, Chest Wall, Brachial Plexus, Major Blood Vessels, and targets.  SPECIAL TREATMENT PROCEDURE:  The planned course of therapy using radiation constitutes a special treatment procedure. Special care  is required in the management of this patient for the following reasons. This treatment constitutes a Special Treatment Procedure for the following reason: [ High dose per fraction requiring special monitoring for increased toxicities of treatment including daily imaging..  The special nature of the planned course of radiotherapy will require increased physician supervision and oversight to ensure patient's safety with optimal treatment outcomes.  RESPIRATORY MOTION MANAGEMENT SIMULATION:  In order to account for effect of respiratory motion on target structures and other organs in the planning and delivery of radiotherapy, this patient underwent respiratory motion management simulation.  To accomplish this, when the patient was brought to the CT simulation planning suite, 4D respiratoy motion management CT images were obtained.  The CT images were loaded into the planning software.  Then, using a variety of tools including Cine, MIP, and standard views, the target volume and planning target volumes (PTV) were delineated.  Avoidance structures were contoured.  Treatment planning then occurred.  Dose volume histograms were generated and reviewed for each of the requested structure.  The resulting plan was carefully reviewed and approved today.  PLAN:  The patient will receive 54 Gy in 3 fractions.  ________________________________  Sheral Apley Tammi Klippel, M.D.    ADDENDUM: I spoke with the referring physician today.  We reviewed the potential timing of treatments and the clinical implications of sequencing.  We agreed that it would be clinically prudent to treat the tonsillar cancer first, and follow-up with possible SBRT after completion and recovery.  We will plan to see the patient back for follow-up after tonsillar radiotherapy.  -  MM

## 2016-06-07 DIAGNOSIS — C01 Malignant neoplasm of base of tongue: Secondary | ICD-10-CM | POA: Diagnosis not present

## 2016-06-07 DIAGNOSIS — Z510 Encounter for antineoplastic radiation therapy: Secondary | ICD-10-CM | POA: Diagnosis not present

## 2016-06-10 DIAGNOSIS — C029 Malignant neoplasm of tongue, unspecified: Secondary | ICD-10-CM | POA: Diagnosis not present

## 2016-06-10 NOTE — Addendum Note (Signed)
Encounter addended by: [name], MD on: 06/10/2016  5:33 PM<BR>    Actions taken: Sign clinical note

## 2016-06-11 DIAGNOSIS — C029 Malignant neoplasm of tongue, unspecified: Secondary | ICD-10-CM | POA: Diagnosis not present

## 2016-06-13 DIAGNOSIS — C029 Malignant neoplasm of tongue, unspecified: Secondary | ICD-10-CM | POA: Diagnosis not present

## 2016-06-18 DIAGNOSIS — Z510 Encounter for antineoplastic radiation therapy: Secondary | ICD-10-CM | POA: Diagnosis not present

## 2016-06-18 DIAGNOSIS — C3411 Malignant neoplasm of upper lobe, right bronchus or lung: Secondary | ICD-10-CM | POA: Diagnosis not present

## 2016-06-18 DIAGNOSIS — C01 Malignant neoplasm of base of tongue: Secondary | ICD-10-CM | POA: Insufficient documentation

## 2016-06-19 DIAGNOSIS — C029 Malignant neoplasm of tongue, unspecified: Secondary | ICD-10-CM | POA: Diagnosis not present

## 2016-06-20 DIAGNOSIS — R110 Nausea: Secondary | ICD-10-CM | POA: Diagnosis not present

## 2016-06-20 DIAGNOSIS — Z510 Encounter for antineoplastic radiation therapy: Secondary | ICD-10-CM | POA: Diagnosis not present

## 2016-06-20 DIAGNOSIS — C01 Malignant neoplasm of base of tongue: Secondary | ICD-10-CM | POA: Diagnosis not present

## 2016-06-24 DIAGNOSIS — C029 Malignant neoplasm of tongue, unspecified: Secondary | ICD-10-CM | POA: Diagnosis not present

## 2016-06-25 ENCOUNTER — Ambulatory Visit (HOSPITAL_COMMUNITY): Payer: PPO

## 2016-06-25 ENCOUNTER — Encounter (HOSPITAL_COMMUNITY): Payer: PPO | Admitting: Dietician

## 2016-06-27 DIAGNOSIS — C029 Malignant neoplasm of tongue, unspecified: Secondary | ICD-10-CM | POA: Diagnosis not present

## 2016-07-02 ENCOUNTER — Ambulatory Visit (HOSPITAL_COMMUNITY): Payer: PPO | Attending: Adult Health | Admitting: Speech Pathology

## 2016-07-02 DIAGNOSIS — C029 Malignant neoplasm of tongue, unspecified: Secondary | ICD-10-CM | POA: Diagnosis not present

## 2016-07-03 ENCOUNTER — Ambulatory Visit: Payer: PPO | Admitting: Radiation Oncology

## 2016-07-04 DIAGNOSIS — F339 Major depressive disorder, recurrent, unspecified: Secondary | ICD-10-CM | POA: Diagnosis not present

## 2016-07-04 DIAGNOSIS — E860 Dehydration: Secondary | ICD-10-CM | POA: Diagnosis not present

## 2016-07-04 DIAGNOSIS — C01 Malignant neoplasm of base of tongue: Secondary | ICD-10-CM | POA: Diagnosis not present

## 2016-07-04 DIAGNOSIS — E785 Hyperlipidemia, unspecified: Secondary | ICD-10-CM | POA: Diagnosis not present

## 2016-07-04 DIAGNOSIS — Z7982 Long term (current) use of aspirin: Secondary | ICD-10-CM | POA: Diagnosis not present

## 2016-07-04 DIAGNOSIS — Z79899 Other long term (current) drug therapy: Secondary | ICD-10-CM | POA: Diagnosis not present

## 2016-07-04 DIAGNOSIS — G8929 Other chronic pain: Secondary | ICD-10-CM | POA: Diagnosis not present

## 2016-07-04 DIAGNOSIS — C3411 Malignant neoplasm of upper lobe, right bronchus or lung: Secondary | ICD-10-CM | POA: Diagnosis not present

## 2016-07-04 DIAGNOSIS — E039 Hypothyroidism, unspecified: Secondary | ICD-10-CM | POA: Diagnosis not present

## 2016-07-04 DIAGNOSIS — I959 Hypotension, unspecified: Secondary | ICD-10-CM | POA: Diagnosis not present

## 2016-07-04 DIAGNOSIS — M545 Low back pain: Secondary | ICD-10-CM | POA: Diagnosis not present

## 2016-07-04 DIAGNOSIS — F17210 Nicotine dependence, cigarettes, uncomplicated: Secondary | ICD-10-CM | POA: Diagnosis not present

## 2016-07-04 DIAGNOSIS — R634 Abnormal weight loss: Secondary | ICD-10-CM | POA: Diagnosis not present

## 2016-07-04 DIAGNOSIS — R110 Nausea: Secondary | ICD-10-CM | POA: Diagnosis not present

## 2016-07-04 DIAGNOSIS — I4891 Unspecified atrial fibrillation: Secondary | ICD-10-CM | POA: Diagnosis not present

## 2016-07-04 DIAGNOSIS — K219 Gastro-esophageal reflux disease without esophagitis: Secondary | ICD-10-CM | POA: Diagnosis not present

## 2016-07-04 DIAGNOSIS — Y842 Radiological procedure and radiotherapy as the cause of abnormal reaction of the patient, or of later complication, without mention of misadventure at the time of the procedure: Secondary | ICD-10-CM | POA: Diagnosis not present

## 2016-07-04 DIAGNOSIS — Z6830 Body mass index (BMI) 30.0-30.9, adult: Secondary | ICD-10-CM | POA: Diagnosis not present

## 2016-07-05 ENCOUNTER — Ambulatory Visit: Payer: PPO | Admitting: Radiation Oncology

## 2016-07-05 DIAGNOSIS — Z510 Encounter for antineoplastic radiation therapy: Secondary | ICD-10-CM | POA: Diagnosis not present

## 2016-07-05 DIAGNOSIS — C3411 Malignant neoplasm of upper lobe, right bronchus or lung: Secondary | ICD-10-CM | POA: Diagnosis not present

## 2016-07-05 DIAGNOSIS — I4891 Unspecified atrial fibrillation: Secondary | ICD-10-CM | POA: Diagnosis not present

## 2016-07-05 DIAGNOSIS — C01 Malignant neoplasm of base of tongue: Secondary | ICD-10-CM | POA: Diagnosis not present

## 2016-07-08 ENCOUNTER — Ambulatory Visit: Payer: PPO | Admitting: Radiation Oncology

## 2016-07-08 DIAGNOSIS — C029 Malignant neoplasm of tongue, unspecified: Secondary | ICD-10-CM | POA: Diagnosis not present

## 2016-07-10 ENCOUNTER — Ambulatory Visit: Payer: PPO | Admitting: Radiation Oncology

## 2016-07-11 ENCOUNTER — Ambulatory Visit: Payer: PPO | Admitting: Radiation Oncology

## 2016-07-11 DIAGNOSIS — C01 Malignant neoplasm of base of tongue: Secondary | ICD-10-CM | POA: Diagnosis not present

## 2016-07-12 ENCOUNTER — Ambulatory Visit: Payer: PPO | Admitting: Radiation Oncology

## 2016-07-15 DIAGNOSIS — C01 Malignant neoplasm of base of tongue: Secondary | ICD-10-CM | POA: Diagnosis not present

## 2016-07-18 DIAGNOSIS — C01 Malignant neoplasm of base of tongue: Secondary | ICD-10-CM | POA: Diagnosis not present

## 2016-07-22 DIAGNOSIS — C029 Malignant neoplasm of tongue, unspecified: Secondary | ICD-10-CM | POA: Diagnosis not present

## 2016-07-22 DIAGNOSIS — C01 Malignant neoplasm of base of tongue: Secondary | ICD-10-CM | POA: Diagnosis not present

## 2016-07-22 DIAGNOSIS — C3411 Malignant neoplasm of upper lobe, right bronchus or lung: Secondary | ICD-10-CM | POA: Diagnosis not present

## 2016-07-26 DIAGNOSIS — C01 Malignant neoplasm of base of tongue: Secondary | ICD-10-CM | POA: Diagnosis not present

## 2016-07-29 NOTE — Progress Notes (Addendum)
  Thoracic Location of Tumor / Histology:  Right and squamous cell carcinoma of right lung.  FUN for discuss of right and squamous cell carcinoma of right lung treatment.  Patient presented a history of right squamous cell carcinoma. In early 2018 he noted a sore throat and difficulty swallowing with pain.   Biopsies of RUL lung lesion (if applicable) revealed:    Tobacco/Marijuana/Snuff/ETOH use: current everyday smoker 1 ppd (down from 2 ppd)  Past/Anticipated interventions by cardiothoracic surgery, if any: no  Past/Anticipated interventions by medical oncology, if any: observation  Signs/Symptoms Weight changes, if any: Weight loss of 29 pounds since 06-05-16  Eating soft foods due to tongue cancer;eating less usually one meal a day.  He saw the dietitian while receiving chemotherapy in [place]. Wt Readings from Last 3 Encounters:  08/01/16 175 lb 6.4 oz (79.6 kg)  06/05/16 204 lb (92.5 kg)  05/27/16 204 lb 9.6 oz (92.8 kg)     Respiratory complaints, if any: SOB,Coughing clear sputum ,smokers cough  Hemoptysis, if any: denies  Pain issues, if any:5/10 low back taking Oxycodone  SAFETY ISSUES:  Prior radiation? yes, 2018 to lower tongue in [place],post adjuvant radiotherapy in 2007  Pacemaker/ICD? no   Possible current pregnancy?no  Is the patient on methotrexate? no  Current Complaints / other details:  66 year old male. Married. Lives in [place]. Referred to Dr. Tammi Klippel to discuss SBRT to RUL lung mass. Reports he has completed radiation to the tongue.      BP (!) 100/59   Pulse 63   Temp 97.8 F (36.6 C) (Oral)   Resp 18   Ht 5\' 5"  (1.651 m)   Wt 175 lb 6.4 oz (79.6 kg)   SpO2 99%   BMI 29.19 kg/m

## 2016-07-31 ENCOUNTER — Other Ambulatory Visit (HOSPITAL_COMMUNITY): Payer: Self-pay | Admitting: Oncology

## 2016-07-31 DIAGNOSIS — C3411 Malignant neoplasm of upper lobe, right bronchus or lung: Secondary | ICD-10-CM

## 2016-07-31 DIAGNOSIS — C029 Malignant neoplasm of tongue, unspecified: Secondary | ICD-10-CM

## 2016-08-01 ENCOUNTER — Encounter: Payer: Self-pay | Admitting: Urology

## 2016-08-01 ENCOUNTER — Ambulatory Visit: Payer: Self-pay | Admitting: Urology

## 2016-08-01 ENCOUNTER — Ambulatory Visit
Admission: RE | Admit: 2016-08-01 | Discharge: 2016-08-01 | Disposition: A | Discharge status: Home / Self Care | Payer: PPO | Source: Ambulatory Visit | Attending: Radiation Oncology | Admitting: Radiation Oncology

## 2016-08-01 ENCOUNTER — Ambulatory Visit
Admission: RE | Admit: 2016-08-01 | Discharge: 2016-08-01 | Disposition: A | Discharge status: Home / Self Care | Payer: PPO | Source: Ambulatory Visit | Attending: Urology | Admitting: Urology

## 2016-08-01 VITALS — BP 100/59 | HR 63 | Temp 97.8°F | Resp 18 | Ht 65.0 in | Wt 175.4 lb

## 2016-08-01 DIAGNOSIS — C3411 Malignant neoplasm of upper lobe, right bronchus or lung: Secondary | ICD-10-CM

## 2016-08-05 ENCOUNTER — Telehealth: Payer: Self-pay | Admitting: *Deleted

## 2016-08-05 NOTE — Telephone Encounter (Signed)
CALLED PATIENT TO INFORM OF SIM APPT. FOR 08-08-16 @ 3 PM WITH DR. MANNING, LVM FOR A RETURN CALL

## 2016-08-07 NOTE — Progress Notes (Signed)
  Radiation Oncology         [phone] ________________________________  Name: Darrell White MRN: [id number]  Date: 08/08/2016  DOB: April 02, 1950  STEREOTACTIC BODY RADIOTHERAPY SIMULATION AND TREATMENT PLANNING NOTE    ICD-10-CM   1. 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  Identity was confirmed.  All relevant records and images related to the planned course of therapy were reviewed.  The patient freely provided informed written consent to proceed with treatment after reviewing the details related to the planned course of therapy. The consent form was witnessed and verified by the simulation staff.  Then, the patient was set-up in a stable reproducible  supine position for radiation therapy.  A BodyFix immobilization pillow was fabricated for reproducible positioning.  Then I personally applied the abdominal compression paddle to limit respiratory excursion.  4D respiratoy motion management CT images were obtained.  Surface markings were placed.  The CT images were loaded into the planning software.  Then, using Cine, MIP, and standard views, the internal target volume (ITV) and planning target volumes (PTV) were delinieated, and avoidance structures were contoured.  Treatment planning then occurred.  The radiation prescription was entered and confirmed.  A total of two complex treatment devices were fabricated in the form of the BodyFix immobilization pillow and a neck accuform cushion.  I have requested : 3D Simulation  I have requested a DVH of the following structures: Heart, Lungs, Esophagus, Chest Wall, Brachial Plexus, Major Blood Vessels, and targets.  SPECIAL TREATMENT PROCEDURE:  The planned course of therapy using radiation constitutes a special treatment procedure. Special care is required in  the management of this patient for the following reasons. This treatment constitutes a Special Treatment Procedure for the following reason: [ High dose per fraction requiring special monitoring for increased toxicities of treatment including daily imaging..  The special nature of the planned course of radiotherapy will require increased physician supervision and oversight to ensure patient's safety with optimal treatment outcomes.  RESPIRATORY MOTION MANAGEMENT SIMULATION:  In order to account for effect of respiratory motion on target structures and other organs in the planning and delivery of radiotherapy, this patient underwent respiratory motion management simulation.  To accomplish this, when the patient was brought to the CT simulation planning suite, 4D respiratoy motion management CT images were obtained.  The CT images were loaded into the planning software.  Then, using a variety of tools including Cine, MIP, and standard views, the target volume and planning target volumes (PTV) were delineated.  Avoidance structures were contoured.  Treatment planning then occurred.  Dose volume histograms were generated and reviewed for each of the requested structure.  The resulting plan was carefully reviewed and approved today.  PLAN:  The patient will receive 54 Gy in 3 fractions  ________________________________  Sheral Apley. Tammi Klippel, M.D.

## 2016-08-08 ENCOUNTER — Ambulatory Visit
Admission: RE | Admit: 2016-08-08 | Discharge: 2016-08-08 | Disposition: A | Discharge status: Home / Self Care | Payer: PPO | Source: Ambulatory Visit | Attending: Radiation Oncology | Admitting: Radiation Oncology

## 2016-08-08 DIAGNOSIS — G629 Polyneuropathy, unspecified: Secondary | ICD-10-CM | POA: Diagnosis not present

## 2016-08-08 DIAGNOSIS — F329 Major depressive disorder, single episode, unspecified: Secondary | ICD-10-CM | POA: Diagnosis not present

## 2016-08-08 DIAGNOSIS — K219 Gastro-esophageal reflux disease without esophagitis: Secondary | ICD-10-CM | POA: Diagnosis not present

## 2016-08-08 DIAGNOSIS — Z923 Personal history of irradiation: Secondary | ICD-10-CM | POA: Diagnosis not present

## 2016-08-08 DIAGNOSIS — C3411 Malignant neoplasm of upper lobe, right bronchus or lung: Secondary | ICD-10-CM

## 2016-08-08 DIAGNOSIS — C029 Malignant neoplasm of tongue, unspecified: Secondary | ICD-10-CM | POA: Diagnosis not present

## 2016-08-08 DIAGNOSIS — E7800 Pure hypercholesterolemia, unspecified: Secondary | ICD-10-CM | POA: Diagnosis not present

## 2016-08-08 DIAGNOSIS — E039 Hypothyroidism, unspecified: Secondary | ICD-10-CM | POA: Diagnosis not present

## 2016-08-08 DIAGNOSIS — I10 Essential (primary) hypertension: Secondary | ICD-10-CM | POA: Diagnosis not present

## 2016-08-08 DIAGNOSIS — F419 Anxiety disorder, unspecified: Secondary | ICD-10-CM | POA: Diagnosis not present

## 2016-08-08 DIAGNOSIS — F17210 Nicotine dependence, cigarettes, uncomplicated: Secondary | ICD-10-CM | POA: Diagnosis not present

## 2016-08-08 NOTE — Progress Notes (Signed)
  Radiation Oncology         ([phone]) [phone] ________________________________  Name: Darrell White MRN: [id number]  Date: 08/08/2016  DOB: 02/20/1950  STEREOTACTIC BODY RADIOTHERAPY SIMULATION AND TREATMENT PLANNING NOTE    ICD-10-CM   1. 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  Identity was confirmed.  All relevant records and images related to the planned course of therapy were reviewed.  The patient freely provided informed written consent to proceed with treatment after reviewing the details related to the planned course of therapy. The consent form was witnessed and verified by the simulation staff.  Then, the patient was set-up in a stable reproducible  supine position for radiation therapy.  A BodyFix immobilization pillow was fabricated for reproducible positioning.  Then I personally applied the abdominal compression paddle to limit respiratory excursion.  4D respiratoy motion management CT images were obtained.  Surface markings were placed.  The CT images were loaded into the planning software.  Then, using Cine, MIP, and standard views, the internal target volume (ITV) and planning target volumes (PTV) were delinieated, and avoidance structures were contoured.  Treatment planning then occurred.  The radiation prescription was entered and confirmed.  A total of two complex treatment devices were fabricated in the form of the BodyFix immobilization pillow and a neck accuform cushion.  I have requested : 3D Simulation  I have requested a DVH of the following structures: Heart, Lungs, Esophagus, Chest Wall, Brachial Plexus, Major Blood Vessels, and targets.  SPECIAL TREATMENT PROCEDURE:  The planned course of therapy using radiation constitutes a special treatment procedure. Special care is required  in the management of this patient for the following reasons. This treatment constitutes a Special Treatment Procedure for the following reason: [ High dose per fraction requiring special monitoring for increased toxicities of treatment including daily imaging..  The special nature of the planned course of radiotherapy will require increased physician supervision and oversight to ensure patient's safety with optimal treatment outcomes.  RESPIRATORY MOTION MANAGEMENT SIMULATION:  In order to account for effect of respiratory motion on target structures and other organs in the planning and delivery of radiotherapy, this patient underwent respiratory motion management simulation.  To accomplish this, when the patient was brought to the CT simulation planning suite, 4D respiratoy motion management CT images were obtained.  The CT images were loaded into the planning software.  Then, using a variety of tools including Cine, MIP, and standard views, the target volume and planning target volumes (PTV) were delineated.  Avoidance structures were contoured.  Treatment planning then occurred.  Dose volume histograms were generated and reviewed for each of the requested structure.  The resulting plan was carefully reviewed and approved today.  PLAN:  The patient will receive 54 Gy in 3 fractions  ________________________________  Sheral Apley. Tammi Klippel, M.D.

## 2016-08-14 DIAGNOSIS — C3411 Malignant neoplasm of upper lobe, right bronchus or lung: Secondary | ICD-10-CM | POA: Diagnosis not present

## 2016-08-22 ENCOUNTER — Ambulatory Visit
Admission: RE | Admit: 2016-08-22 | Discharge: 2016-08-22 | Disposition: A | Discharge status: Home / Self Care | Payer: PPO | Source: Ambulatory Visit | Attending: Radiation Oncology | Admitting: Radiation Oncology

## 2016-08-22 DIAGNOSIS — C3411 Malignant neoplasm of upper lobe, right bronchus or lung: Secondary | ICD-10-CM | POA: Diagnosis not present

## 2016-08-26 ENCOUNTER — Ambulatory Visit
Admission: RE | Admit: 2016-08-26 | Discharge: 2016-08-26 | Disposition: A | Discharge status: Home / Self Care | Payer: PPO | Source: Ambulatory Visit | Attending: Radiation Oncology | Admitting: Radiation Oncology

## 2016-08-26 DIAGNOSIS — C3411 Malignant neoplasm of upper lobe, right bronchus or lung: Secondary | ICD-10-CM | POA: Diagnosis not present

## 2016-08-27 ENCOUNTER — Ambulatory Visit: Payer: PPO

## 2016-08-27 DIAGNOSIS — C01 Malignant neoplasm of base of tongue: Secondary | ICD-10-CM | POA: Diagnosis not present

## 2016-08-27 DIAGNOSIS — Z510 Encounter for antineoplastic radiation therapy: Secondary | ICD-10-CM | POA: Diagnosis not present

## 2016-08-27 DIAGNOSIS — C3411 Malignant neoplasm of upper lobe, right bronchus or lung: Secondary | ICD-10-CM | POA: Diagnosis not present

## 2016-08-28 ENCOUNTER — Ambulatory Visit
Admission: RE | Admit: 2016-08-28 | Discharge: 2016-08-28 | Disposition: A | Discharge status: Home / Self Care | Payer: PPO | Source: Ambulatory Visit | Attending: Urology | Admitting: Urology

## 2016-08-28 ENCOUNTER — Ambulatory Visit
Admission: RE | Admit: 2016-08-28 | Discharge: 2016-08-28 | Disposition: A | Discharge status: Home / Self Care | Payer: PPO | Source: Ambulatory Visit | Attending: Radiation Oncology | Admitting: Radiation Oncology

## 2016-08-28 VITALS — BP 100/54 | HR 57 | Resp 16 | Wt 175.0 lb

## 2016-08-28 DIAGNOSIS — C3411 Malignant neoplasm of upper lobe, right bronchus or lung: Secondary | ICD-10-CM | POA: Diagnosis not present

## 2016-08-28 NOTE — Progress Notes (Signed)
Weight stable. BP low despite not taking bp medication (Toprol) this morning. Reports occasional episodes of dizziness. Encourage patient to follow up with PCP reference low bp and Toprol. Patient verbalized understanding. Reports a productive cough with tan sputum. Denies hemoptysis. Denies SOB or dyspnea. Denies difficulty swallowing or skin changes within treatment field. Denies fatigue or decline in energy level. One month follow up appointment card given.   BP (!) 100/54 (BP Location: Right Arm, Patient Position: Sitting, Cuff Size: Normal)   Pulse (!) 57   Resp 16   Wt 175 lb (79.4 kg)   SpO2 97%   BMI 29.12 kg/m  Wt Readings from Last 3 Encounters:  08/28/16 175 lb (79.4 kg)  08/01/16 175 lb 6.4 oz (79.6 kg)  06/05/16 204 lb (92.5 kg)

## 2016-08-28 NOTE — Progress Notes (Signed)
  Radiation Oncology         [phone]) [phone] ________________________________  Name: Darrell White MRN: [id number]  Date: 08/28/2016  DOB: February 01, 1951  Weekly Radiation Therapy Management    ICD-10-CM   1. Primary cancer of right upper lobe of lung (HCC) C34.11     Current Dose: 54 Gy     Planned Dose:  54 Gy  Narrative:  The patient presents for the final under treatment assessment.        The patient has had some continuation of previously noted symptoms but no new or progressive symptoms related to his radiotherapy.  Specifically, he denies increased SOB, chest pain, dyspnea, hemoptysis, fever, chills or night sweats.  He denies dysphagia and reports that he is eating and drinking well to maintain his weight.  He has noticed some dizziness/lightheadedness on occasion when he is rising from a seated position or getting out of the bed in the mornings. This has been present intermittently for the past 2-3 weeks.                         Set-up films were reviewed by Dr. Tammi Klippel.                                 The chart was checked. Physical Findings. . . In general this is a well appearing caucasian male in no acute distress. He's alert and oriented x4 and appropriate throughout the examination. Cardiopulmonary assessment is negative for acute distress and he exhibits normal effort.  Weight essentially stable.  He is hypotensive on exam today with a BP of 100/50. Impression . . . . . . . The patient tolerated radiation relatively well. Patient denies coughing up blood. He endorses good hydration, but states he forgot to take metoprolol this morning. Patient has noticed symptoms of low blood pressure intermittently as stated above. Plan . . . . . . Encouraged patient to drink at least 64 oz fluids a day to help maintain his BP. Also encouraged patient to monitor his BP regularly and if his BP remains low, he should f/u with PCP.  He states that he has a local fire station near his house that he can get  to easily for BP checks.  He will keep a record of his BP readings.  Advised to hold BP medications for BP equal to or below 100/50. He knows to present to the ER immediately for progressively worsening symptoms of hypotension or new symptoms such as chest pain, shortness of breath, fever, shaking chills, etc.   He completed radiation today as scheduled, and will plan to follow-up in one month. The patient was encouraged to call or return to the clinic in the interim for any worsening symptoms, questions or concerns.    Nicholos Johns, PA-C   This document serves as a record of services personally performed by [employer], PA-C. It was created on their behalf by Linward Natal, a trained medical scribe. The creation of this record is based on the scribe's personal observations and the provider's statements to them. This document has been checked and approved by the attending provider.

## 2016-08-28 NOTE — Progress Notes (Signed)
  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  Volumetric arc fields were employed to deliver 6 MV X-rays.  Image guidance was performed with per fraction cone beam CT prior to treatment under personal MD supervision.  Immobilization was achieved using BodyFix Pillow.  Narrative: The patient tolerated radiation treatment relatively well.   He denied any negative adverse effects throughout his treatment and specifically denies significant fatigue, dysphagia, chest pain or shortness of breath.  Plan: The patient has completed radiation treatment. The patient will return to radiation oncology clinic for routine followup in one month. I advised them to call or return sooner if they have any questions or concerns related to their recovery or treatment. ________________________________  Sheral Apley. Tammi Klippel, M.D.

## 2016-08-29 ENCOUNTER — Ambulatory Visit (INDEPENDENT_AMBULATORY_CARE_PROVIDER_SITE_OTHER): Payer: PPO | Admitting: Otolaryngology

## 2016-08-29 ENCOUNTER — Ambulatory Visit: Payer: PPO

## 2016-09-11 NOTE — Progress Notes (Signed)
  Radiation Oncology         [phone] ________________________________  Name: Darrell White MRN: [id number]  Date: 08/01/2016  DOB: 1950-10-11  Chart Note:  I saw the patient during this encounter and we documented our findings.  However, the documentation was pending and has been deleted.  There will be no charge for this visit  ________________________________  Sheral Apley. Tammi Klippel, M.D.

## 2016-10-08 ENCOUNTER — Ambulatory Visit
Admission: RE | Admit: 2016-10-08 | Discharge: 2016-10-08 | Disposition: A | Discharge status: Home / Self Care | Payer: PPO | Source: Ambulatory Visit | Attending: Radiation Oncology | Admitting: Radiation Oncology

## 2016-10-08 DIAGNOSIS — K219 Gastro-esophageal reflux disease without esophagitis: Secondary | ICD-10-CM | POA: Insufficient documentation

## 2016-10-08 DIAGNOSIS — Z923 Personal history of irradiation: Secondary | ICD-10-CM | POA: Insufficient documentation

## 2016-10-08 DIAGNOSIS — G629 Polyneuropathy, unspecified: Secondary | ICD-10-CM | POA: Insufficient documentation

## 2016-10-08 DIAGNOSIS — F329 Major depressive disorder, single episode, unspecified: Secondary | ICD-10-CM | POA: Insufficient documentation

## 2016-10-08 DIAGNOSIS — F17210 Nicotine dependence, cigarettes, uncomplicated: Secondary | ICD-10-CM | POA: Insufficient documentation

## 2016-10-08 DIAGNOSIS — E7800 Pure hypercholesterolemia, unspecified: Secondary | ICD-10-CM | POA: Insufficient documentation

## 2016-10-08 DIAGNOSIS — C029 Malignant neoplasm of tongue, unspecified: Secondary | ICD-10-CM | POA: Insufficient documentation

## 2016-10-08 DIAGNOSIS — F419 Anxiety disorder, unspecified: Secondary | ICD-10-CM | POA: Insufficient documentation

## 2016-10-08 DIAGNOSIS — C3411 Malignant neoplasm of upper lobe, right bronchus or lung: Secondary | ICD-10-CM | POA: Insufficient documentation

## 2016-10-08 DIAGNOSIS — I10 Essential (primary) hypertension: Secondary | ICD-10-CM | POA: Insufficient documentation

## 2016-10-08 DIAGNOSIS — E039 Hypothyroidism, unspecified: Secondary | ICD-10-CM | POA: Insufficient documentation

## 2016-10-16 ENCOUNTER — Ambulatory Visit: Payer: PPO | Attending: Pain Medicine | Admitting: Pain Medicine

## 2016-10-16 ENCOUNTER — Other Ambulatory Visit: Payer: Self-pay

## 2016-10-16 ENCOUNTER — Encounter: Payer: Self-pay | Admitting: Pain Medicine

## 2016-10-16 VITALS — BP 120/60 | HR 68 | Temp 98.2°F | Resp 16 | Ht 68.0 in | Wt 160.0 lb

## 2016-10-16 DIAGNOSIS — G894 Chronic pain syndrome: Secondary | ICD-10-CM | POA: Diagnosis not present

## 2016-10-16 DIAGNOSIS — M5030 Other cervical disc degeneration, unspecified cervical region: Secondary | ICD-10-CM

## 2016-10-16 DIAGNOSIS — M47816 Spondylosis without myelopathy or radiculopathy, lumbar region: Secondary | ICD-10-CM | POA: Diagnosis not present

## 2016-10-16 DIAGNOSIS — I10 Essential (primary) hypertension: Secondary | ICD-10-CM | POA: Diagnosis not present

## 2016-10-16 DIAGNOSIS — G893 Neoplasm related pain (acute) (chronic): Secondary | ICD-10-CM | POA: Diagnosis not present

## 2016-10-16 DIAGNOSIS — Z789 Other specified health status: Secondary | ICD-10-CM

## 2016-10-16 DIAGNOSIS — M542 Cervicalgia: Secondary | ICD-10-CM | POA: Diagnosis not present

## 2016-10-16 DIAGNOSIS — M791 Myalgia: Secondary | ICD-10-CM

## 2016-10-16 DIAGNOSIS — E7800 Pure hypercholesterolemia, unspecified: Secondary | ICD-10-CM | POA: Diagnosis not present

## 2016-10-16 DIAGNOSIS — Z95828 Presence of other vascular implants and grafts: Secondary | ICD-10-CM

## 2016-10-16 DIAGNOSIS — Z85810 Personal history of malignant neoplasm of tongue: Secondary | ICD-10-CM | POA: Insufficient documentation

## 2016-10-16 DIAGNOSIS — M5136 Other intervertebral disc degeneration, lumbar region: Secondary | ICD-10-CM | POA: Diagnosis not present

## 2016-10-16 DIAGNOSIS — M47812 Spondylosis without myelopathy or radiculopathy, cervical region: Secondary | ICD-10-CM | POA: Diagnosis not present

## 2016-10-16 DIAGNOSIS — M5441 Lumbago with sciatica, right side: Secondary | ICD-10-CM | POA: Diagnosis not present

## 2016-10-16 DIAGNOSIS — M5442 Lumbago with sciatica, left side: Secondary | ICD-10-CM | POA: Diagnosis not present

## 2016-10-16 DIAGNOSIS — E039 Hypothyroidism, unspecified: Secondary | ICD-10-CM | POA: Diagnosis not present

## 2016-10-16 DIAGNOSIS — M79605 Pain in left leg: Secondary | ICD-10-CM | POA: Diagnosis not present

## 2016-10-16 DIAGNOSIS — F1190 Opioid use, unspecified, uncomplicated: Secondary | ICD-10-CM

## 2016-10-16 DIAGNOSIS — Z9889 Other specified postprocedural states: Secondary | ICD-10-CM

## 2016-10-16 DIAGNOSIS — M51369 Other intervertebral disc degeneration, lumbar region without mention of lumbar back pain or lower extremity pain: Secondary | ICD-10-CM

## 2016-10-16 DIAGNOSIS — G8929 Other chronic pain: Secondary | ICD-10-CM

## 2016-10-16 DIAGNOSIS — M79604 Pain in right leg: Secondary | ICD-10-CM

## 2016-10-16 DIAGNOSIS — F419 Anxiety disorder, unspecified: Secondary | ICD-10-CM | POA: Insufficient documentation

## 2016-10-16 DIAGNOSIS — M792 Neuralgia and neuritis, unspecified: Secondary | ICD-10-CM | POA: Diagnosis not present

## 2016-10-16 DIAGNOSIS — F329 Major depressive disorder, single episode, unspecified: Secondary | ICD-10-CM | POA: Diagnosis not present

## 2016-10-16 DIAGNOSIS — M7918 Myalgia, other site: Secondary | ICD-10-CM

## 2016-10-16 DIAGNOSIS — Z79891 Long term (current) use of opiate analgesic: Secondary | ICD-10-CM | POA: Diagnosis not present

## 2016-10-16 DIAGNOSIS — Z79899 Other long term (current) drug therapy: Secondary | ICD-10-CM

## 2016-10-16 DIAGNOSIS — M899 Disorder of bone, unspecified: Secondary | ICD-10-CM

## 2016-10-16 DIAGNOSIS — M4726 Other spondylosis with radiculopathy, lumbar region: Secondary | ICD-10-CM

## 2016-10-16 DIAGNOSIS — M961 Postlaminectomy syndrome, not elsewhere classified: Secondary | ICD-10-CM | POA: Diagnosis not present

## 2016-10-16 MED ORDER — OXYCODONE HCL 10 MG PO TABS: 10.0000 mg | ORAL_TABLET | Freq: Four times a day (QID) | ORAL | 0 refills | Status: DC | PRN

## 2016-10-16 MED ORDER — PAIN MANAGEMENT IT PUMP REFILL: 1.0000 | Freq: Once | INTRATHECAL | 0 refills | Status: AC

## 2016-10-16 NOTE — Patient Instructions (Addendum)
____________________________________________________________________________________________  Pain Scale  Introduction: The pain score used by this practice is the Verbal Numerical Rating Scale (VNRS-11). This is an 11-point scale. It is for adults and children 10 years or older. There are significant differences in how the pain score is reported, used, and applied. Forget everything you learned in the past and learn this scoring system.  General Information: The scale should reflect your current level of pain. Unless you are specifically asked for the level of your worst pain, or your average pain. If you are asked for one of these two, then it should be understood that it is over the past 24 hours.  Basic Activities of Daily Living (ADL): Personal hygiene, dressing, eating, transferring, and using restroom.  Instructions: Most patients tend to report their level of pain as a combination of two factors, their physical pain and their psychosocial pain. This last one is also known as suffering and it is reflection of how physical pain affects you socially and psychologically. From now on, report them separately. From this point on, when asked to report your pain level, report only your physical pain. Use the following table for reference.  Pain Clinic Pain Levels (0-5/10)  Pain Level Score  Description  No Pain 0   Mild pain 1 Nagging, annoying, but does not interfere with basic activities of daily living (ADL). Patients are able to eat, bathe, get dressed, toileting (being able to get on and off the toilet and perform personal hygiene functions), transfer (move in and out of bed or a chair without assistance), and maintain continence (able to control bladder and bowel functions). Blood pressure and heart rate are unaffected. A normal heart rate for a healthy adult ranges from 60 to 100 bpm (beats per minute).   Mild to moderate pain 2 Noticeable and distracting. Impossible to hide from other  people. More frequent flare-ups. Still possible to adapt and function close to normal. It can be very annoying and may have occasional stronger flare-ups. With discipline, patients may get used to it and adapt.   Moderate pain 3 Interferes significantly with activities of daily living (ADL). It becomes difficult to feed, bathe, get dressed, get on and off the toilet or to perform personal hygiene functions. Difficult to get in and out of bed or a chair without assistance. Very distracting. With effort, it can be ignored when deeply involved in activities.   Moderately severe pain 4 Impossible to ignore for more than a few minutes. With effort, patients may still be able to manage work or participate in some social activities. Very difficult to concentrate. Signs of autonomic nervous system discharge are evident: dilated pupils (mydriasis); mild sweating (diaphoresis); sleep interference. Heart rate becomes elevated (>115 bpm). Diastolic blood pressure (lower number) rises above 100 mmHg. Patients find relief in laying down and not moving.   Severe pain 5 Intense and extremely unpleasant. Associated with frowning face and frequent crying. Pain overwhelms the senses.  Ability to do any activity or maintain social relationships becomes significantly limited. Conversation becomes difficult. Pacing back and forth is common, as getting into a comfortable position is nearly impossible. Pain wakes you up from deep sleep. Physical signs will be obvious: pupillary dilation; increased sweating; goosebumps; brisk reflexes; cold, clammy hands and feet; nausea, vomiting or dry heaves; loss of appetite; significant sleep disturbance with inability to fall asleep or to remain asleep. When persistent, significant weight loss is observed due to the complete loss of appetite and sleep deprivation.  Blood  pressure and heart rate becomes significantly elevated. Caution: If elevated blood pressure triggers a pounding headache  associated with blurred vision, then the patient should immediately seek attention at an urgent or emergency care unit, as these may be signs of an impending stroke.    Emergency Department Pain Levels (6-10/10)  Emergency Room Pain 6 Severely limiting. Requires emergency care and should not be seen or managed at an outpatient pain management facility. Communication becomes difficult and requires great effort. Assistance to reach the emergency department may be required. Facial flushing and profuse sweating along with potentially dangerous increases in heart rate and blood pressure will be evident.   Distressing pain 7 Self-care is very difficult. Assistance is required to transport, or use restroom. Assistance to reach the emergency department will be required. Tasks requiring coordination, such as bathing and getting dressed become very difficult.   Disabling pain 8 Self-care is no longer possible. At this level, pain is disabling. The individual is unable to do even the most basic activities such as walking, eating, bathing, dressing, transferring to a bed, or toileting. Fine motor skills are lost. It is difficult to think clearly.   Incapacitating pain 9 Pain becomes incapacitating. Thought processing is no longer possible. Difficult to remember your own name. Control of movement and coordination are lost.   The worst pain imaginable 10 At this level, most patients pass out from pain. When this level is reached, collapse of the autonomic nervous system occurs, leading to a sudden drop in blood pressure and heart rate. This in turn results in a temporary and dramatic drop in blood flow to the brain, leading to a loss of consciousness. Fainting is one of the bodys self defense mechanisms. Passing out puts the brain in a calmed state and causes it to shut down for a while, in order to begin the healing process.    Summary: 1. Refer to this scale when providing [place] with your pain level. 2. Be  accurate and careful when reporting your pain level. This will help with your care. 3. Over-reporting your pain level will lead to loss of credibility. 4. Even a level of 1/10 means that there is pain and will be treated at our facility. 5. High, inaccurate reporting will be documented as Symptom Exaggeration, leading to loss of credibility and suspicions of possible secondary gains such as obtaining more narcotics, or wanting to appear disabled, for fraudulent reasons. 6. Only pain levels of 5 or below will be seen at our facility. 7. Pain levels of 6 and above will be sent to the Emergency Department and the appointment cancelled. ____________________________________________________________________________________________   ____________________________________________________________________________________________  Appointment Policy Summary  It is our goal and responsibility to provide the medical community with assistance in the evaluation and management of patients with chronic pain. Unfortunately our resources are limited. Because we do not have an unlimited amount of time, or available appointments, we are required to closely monitor and manage their use. The following rules exist to maximize their use:  Patient's responsibilities: 1. Punctuality:  At what time should I arrive? You should be physically present in our office 30 minutes before your scheduled appointment. Your scheduled appointment is with your assigned healthcare provider. However, it takes 5-10 minutes to be "checked-in", and another 15 minutes for the nurses to do the admission. If you arrive to our office at the time you were given for your appointment, you will end up being at least 20-25 minutes late to your appointment with the  provider. 2. Tardiness:  What happens if I arrive only a few minutes after my scheduled appointment time? You will need to reschedule your appointment. The cutoff is your appointment time.  This is why it is so important that you arrive at least 30 minutes before that appointment. If you have an appointment scheduled for 10:00 AM and you arrive at 10:01, you will be required to reschedule your appointment.  3. Plan ahead:  Always assume that you will encounter traffic on your way in. Plan for it. If you are dependent on a driver, make sure they understand these rules and the need to arrive early. 4. Other appointments and responsibilities:  Avoid scheduling any other appointments before or after your pain clinic appointments.  5. Be prepared:  Write down everything that you need to discuss with your healthcare provider and give this information to the admitting nurse. Write down the medications that you will need refilled. Bring your pills and bottles (even the empty ones), to all of your appointments, except for those where a procedure is scheduled. 6. No children or pets:  Find someone to take care of them. It is not appropriate to bring them in. 7. Scheduling changes:  We request "advanced notification" of any changes or cancellations. 8. Advanced notification:  Defined as a time period of more than 24 hours prior to the originally scheduled appointment. This allows for the appointment to be offered to other patients. 9. Rescheduling:  When a visit is rescheduled, it will require the cancellation of the original appointment. For this reason they both fall within the category of "Cancellations".  10. Cancellations:  They require advanced notification. Any cancellation less than 24 hours before the  appointment will be recorded as a "No Show". 11. No Show:  Defined as an unkept appointment where the patient failed to notify or declare to the practice their intention or inability to keep the appointment.  Corrective process for repeat offenders:  1. Tardiness: Three (3) episodes of rescheduling due to late arrivals will be recorded as one (1) "No Show". 2. Cancellation or  reschedule: Three (3) cancellations or rescheduling will be recorded as one (1) "No Show". 3. "No Shows": Three (3) "No Shows" within a 12 month period will result in discharge from the practice.  ____________________________________________________________________________________________   ____________________________________________________________________________________________  Medication Rules  Applies to: All patients receiving prescriptions (written or electronic).  Pharmacy of record: Pharmacy where electronic prescriptions will be sent. If written prescriptions are taken to a different pharmacy, please inform the nursing staff. The pharmacy listed in the electronic medical record should be the one where you would like electronic prescriptions to be sent.  Prescription refills: Only during scheduled appointments. Applies to both, written and electronic prescriptions.  NOTE: The following applies primarily to controlled substances (Opioid* Pain Medications).   Patient's responsibilities: 1. Pain Pills: Bring all pain pills to every appointment (except for procedure appointments). 2. Pill Bottles: Bring pills in original pharmacy bottle. Always bring newest bottle. Bring bottle, even if empty. 3. Medication refills: You are responsible for knowing and keeping track of what medications you need refilled. The day before your appointment, write a list of all prescriptions that need to be refilled. Bring that list to your appointment and give it to the admitting nurse. Prescriptions will be written only during appointments. If you forget a medication, it will not be "Called in", "Faxed", or "electronically sent". You will need to get another appointment to get these prescribed. 4. Prescription Accuracy:  You are responsible for carefully inspecting your prescriptions before leaving our office. Have the discharge nurse carefully go over each prescription with you, before taking them home. Make  sure that your name is accurately spelled, that your address is correct. Check the name and dose of your medication to make sure it is accurate. Check the number of pills, and the written instructions to make sure they are clear and accurate. Make sure that you are given enough medication to last until your next medication refill appointment. 5. Taking Medication: Take medication as prescribed. Never take more pills than instructed. Never take medication more frequently than prescribed. Taking less pills or less frequently is permitted and encouraged, when it comes to controlled substances (written prescriptions).  6. Inform other Doctors: Always inform, all of your healthcare providers, of all the medications you take. 7. Pain Medication from other Providers: You are not allowed to accept any additional pain medication from any other Doctor or Healthcare provider. There are two exceptions to this rule. (see below) In the event that you require additional pain medication, you are responsible for notifying us, as stated below. 8. Medication Agreement: You are responsible for carefully reading and following our Medication Agreement. This must be signed before receiving any prescriptions from our practice. Safely store a copy of your signed Agreement. Violations to the Agreement will result in no further prescriptions. (Additional copies of our Medication Agreement are available upon request.) 9. Laws, Rules, & Regulations: All patients are expected to follow all Federal and [employer], [employer], Rules, [employer]. Ignorance of the Laws does not constitute a valid excuse. The use of any illegal substances is prohibited. 10. Adopted CDC guidelines & recommendations: Target dosing levels will be at or below 60 MME/day. Use of benzodiazepines** is not recommended.  Exceptions: There are only two exceptions to the rule of not receiving pain medications from other Healthcare Providers. 1. Exception #1  (Emergencies): In the event of an emergency (i.e.: accident requiring emergency care), you are allowed to receive additional pain medication. However, you are responsible for: As soon as you are able, call our office [phone], at any time of the day or night, and leave a message stating your name, the date and nature of the emergency, and the name and dose of the medication prescribed. In the event that your call is answered by a member of our staff, make sure to document and save the date, time, and the name of the person that took your information.  2. Exception #2 (Planned Surgery): In the event that you are scheduled by another doctor or dentist to have any type of surgery or procedure, you are allowed (for a period no longer than 30 days), to receive additional pain medication, for the acute post-op pain. However, in this case, you are responsible for picking up a copy of our "Post-op Pain Management for Surgeons" handout, and giving it to your surgeon or dentist. This document is available at our office, and does not require an appointment to obtain it. Simply go to our office during business hours (Monday-Thursday from 8:00 AM to 4:00 PM) (Friday 8:00 AM to 12:00 Noon) or if you have a scheduled appointment with [place], prior to your surgery, and ask for it by name. In addition, you will need to provide [place] with your name, name of your surgeon, type of surgery, and date of procedure or surgery.  *Opioid medications include: morphine, codeine, oxycodone, oxymorphone, hydrocodone, hydromorphone, meperidine, tramadol, tapentadol, buprenorphine, fentanyl,  methadone. **Benzodiazepine medications include: diazepam (Valium), alprazolam (Xanax), clonazepam (Klonopine), lorazepam (Ativan), clorazepate (Tranxene), chlordiazepoxide (Librium), estazolam (Prosom), oxazepam (Serax), temazepam (Restoril), triazolam  (Halcion)  ____________________________________________________________________________________________ ____________________________________________________________________________________________  DRUG HOLIDAYS  Definitions Tolerance: defined as the progressively decreased responsiveness to a drug. Occurs when the drug is used repeatedly and the body adapts to the continued presence of the drug. As a result, a larger dose of the drug is needed to achieve the effect originally obtained by a smaller dose. It is thought to be due to the formation of excess opioid receptors.  Drug Holiday: is when a patient stops taking a medication(s) for a period of time; anywhere from a few days to several weeks.  Withdrawals: refers to the wide range of symptoms that occur after stopping or dramatically reducing opiate drugs after heavy and prolonged use. Withdrawal symptoms do not occur to patients that use low dose opioids, or those who take the medication sporadically. Contrary to benzodiazepine (example: Valium, Xanax, etc.) or alcohol withdrawals (Delirium Tremens), opioid withdrawals are not lethal. Withdrawals are the physical manifestation of the body getting rid of the excess receptors.  Purpose To eliminate tolerance.  Duration of Holiday 14 consecutive days. (2 weeks)  Expected Symptoms Early symptoms of withdrawal include:  Agitation  Anxiety  Muscle aches  Increased tearing  Insomnia  Runny nose  Sweating  Yawning  Late symptoms of withdrawal include:  Abdominal cramping  Diarrhea  Dilated pupils  Goose bumps  Nausea  Vomiting  Opioid withdrawal reactions are very uncomfortable but are not life-threatening. Symptoms usually start within 12 hours of last opioid dose and within 30 hours of last methadone exposure.  Duration of Symptoms 48 to 72 hours for short acting medications and 2 to 14 days for methadone.  Treatment  Clonidine (Catapres) or tizanidine  (Zanaflex) for agitation, sweating, tearing, runny nose.  Promethazine (Phenergan) for nausea, vomiting.  NSAIDs for pain.  Benefits  Improved effectiveness of opioids.  Decreased opioid dose needed to achieve benefits.  Improved pain with lesser dose. ____________________________________________________________________________________________   Intrathecal Pain Pump Information What is the intrathecal pain pump? An intrathecal pain pump is a pump that sends medicine into your spinal canal through a thin tube (catheter). You may need an intrathecal pain pump if you have long-term pain that cannot be managed in other ways. In some cases, these pumps can be used to deliver medicine that treats long-term muscle spasms (spasticity). If you are using the pump for only a short time, the pump may be outside of your body. For long-term use, the pump and the catheter are implanted inside your body. The pump is about the size of a hockey puck. It is usually placed under the skin of the abdomen. The catheter will run under your skin from your abdomen to your back. Your health care provider will program the pump to deliver pain medicine into your spinal canal continuously or at certain intervals. You may have a handheld device that allows you to give yourself a dose of pain medicine when needed. The pump runs on a battery. The battery will last 7-10 years, and then the pump will need to be replaced. What are the benefits? Opioid pain medicines are strong medicinesthat are oftenused to control long-term and severe pain. When these medicines are placed in the fluid space (intrathecal space) that surrounds your spinal cord and brain, the medicine will block the pain signals that come from your body. This type of pump may be an option if you  have long-term pain that cannot be managed with opioids that are taken by mouth or by injection. The pump may allow you to have better control of your pain with less  medicine. What are the risks? You will need to have a surgical procedure to place the catheter and the pain pump. This surgery has some risks. There are also risks to long-term use of opioids. Before you can have the pump placed, you will need to go through a pain control trial. Medicine will be placed into your intrathecal space to see if itcontrols your pain and to see how well you tolerate the medicine. Risks of pain pump implantation and use include:  Infection.  Bleeding.  Failure of the pump or the catheter.  Leaking of fluid from your spinal canal (cerebrospinal fluid leak).  A growth around the tip of the catheter inside your spinal canal (granuloma). This can put pressure on your spine.  A need to remove the pump if problems develop.  A need to replace the pump after 7-10 years.  Risks of long-term opioid use include:  A breathing problem that prevents you from getting enough oxygen (respiratory depression).  Physical and psychological dependence.  Constipation or difficulty passing urine.  Nausea and vomiting.  Mental changes, including feeling high, dizzy, drowsy, confused, depressed, or anxious.  How do I use the pump? Your health care provider will decide what type of pump is best for you.If you have a pump that gives a dose of medicine on demand, you may have a handheld device to trigger the release of your medicine. You will need to see your health care provider often to make sure the pump is programmed correctly for your pain. This programming may need to be changed over time. You will also need to have the pump resupplied with medicine. Your health care provider may do this at your routine visits. You should also know that:  Electronic devices or scans will not damage your pump.  You will need a device identification card to pass through security gates.  Your device will have an alarm to warn you of a malfunction or low battery power. The alarm will sound  if you need a medicine refill. Contact your health care provider if your alarm sounds.  When should I seek medical care? Call your health care provider right away if:  You have severe constipation or trouble passing urine.  You have a severe headache or a headache that does not go away after 2 days.  You have redness, swelling, or pain near your pump site.  Your pain is not controlled.  When should I seek immediate medical care? Seek emergency care or call your local emergency services (911 in the [place] if:  You have trouble breathing.  You have passed out or had a seizure.  You suddenly have severe back pain, leg weakness, or loss of bladder or bowel control (incontinence).  You feel very dizzy, confused, or drowsy.  This information is not intended to replace advice given to you by your health care provider. Make sure you discuss any questions you have with your health care provider. Document Released: 05/09/2008 Document Revised: 12/18/2015 Document Reviewed: 09/12/2014 Elsevier Interactive Patient Education  2017 [place]. Pain Management Discharge Instructions  General Discharge Instructions :  If you need to reach your doctor call: Monday-Friday 8:00 am - 4:00 pm at [phone] or toll free [phone].  After clinic hours [phone] to have operator reach doctor.  Bring all of your  medication bottles to all your appointments in the pain clinic.  To cancel or reschedule your appointment with Pain Management please remember to call 24 hours in advance to avoid a fee.  Refer to the educational materials which you have been given on: General Risks, I had my Procedure. Discharge Instructions, Post Sedation.  Post Procedure Instructions:  The drugs you were given will stay in your system until tomorrow, so for the next 24 hours you should not drive, make any legal decisions or drink any alcoholic beverages.  You may eat anything you prefer, but it is better to  start with liquids then soups and crackers, and gradually work up to solid foods.  Please notify your doctor immediately if you have any unusual bleeding, trouble breathing or pain that is not related to your normal pain.  Depending on the type of procedure that was done, some parts of your body may feel week and/or numb.  This usually clears up by tonight or the next day.  Walk with the use of an assistive device or accompanied by an adult for the 24 hours.  You may use ice on the affected area for the first 24 hours.  Put ice in a Ziploc bag and cover with a towel and place against area 15 minutes on 15 minutes off.  You may switch to heat after 24 hours.

## 2016-10-16 NOTE — Progress Notes (Signed)
Nursing Pain Medication Assessment:  Safety precautions to be maintained throughout the outpatient stay will include: orient to surroundings, keep bed in low position, maintain call bell within reach at all times, provide assistance with transfer out of bed and ambulation. Patient not able to read and/or write. Medication Inspection Compliance: Pill count conducted under aseptic conditions, in front of the patient. Neither the pills nor the bottle was removed from the patient's sight at any time. Once count was completed pills were immediately returned to the patient in their original bottle.  Medication: Oxycodone IR Pill/Patch Count: 4 of 120 pills remain Pill/Patch Appearance: Markings consistent with prescribed medication Bottle Appearance: Standard pharmacy container. Clearly labeled. Filled Date: 07 / 01/2017 Last Medication intake:  Today

## 2016-10-16 NOTE — Progress Notes (Signed)
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 lung ([place]) 06/04/2016  . 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 Chronic low back pain (Primary Area of Pain) (Bilateral) (R>L) 10/16/2016  . Chronic lower extremity pain (Secondary Area of Pain) (Bilateral) (R>L) 10/16/2016  . 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 Chronic neck pain [facility] Area of Pain) (Left) 10/16/2016  . History of cervical spinal surgery 10/16/2016  . Chronic  pain syndrome 10/16/2016  . Long term (current) use of opiate analgesic 10/16/2016  . Long term prescription opiate use 10/16/2016  . Opiate use 10/16/2016  . Cancer-related pain 10/25/2016  . Neurogenic pain 10/25/2016  . Neuropathic pain 10/25/2016  . Chronic musculoskeletal pain 10/25/2016  . Disorder of skeletal system 10/25/2016  . Pharmacologic therapy 10/25/2016  . Problems influencing health status 10/25/2016  . DDD (degenerative disc disease),  cervical 10/25/2016  . DDD (degenerative disc disease), lumbar 10/25/2016  . Cervical spondylosis 10/25/2016  . Lumbar spondylosis 10/25/2016   Resolved Ambulatory Problems    Diagnosis Date Noted  . No Resolved Ambulatory Problems   Past Medical History:  Diagnosis Date  . Anxiety   . Arthritis   . Cancer ([place])   . Depression   . GERD (gastroesophageal reflux disease)   . HOH (hard of hearing)   . Hypercholesteremia   . Hypertension   . Hypothyroid   . Lung cancer ([place])   . Neuropathy   . 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 In no apparent acute distress Vitals:   10/16/16 1021  BP: 120/60  Pulse: 68  Resp: 16  Temp: 98.2 F (36.8 C)  SpO2: 97%  Weight: 160 lb (72.6 kg)  Height: _0  (1.727 m)   BMI Assessment: Estimated body mass index is 24.33 kg/m as calculated from the following:   Height as of this encounter: _1  (1.727 m).   Weight as of this encounter: 160 lb (72.6 kg).  BMI interpretation table: BMI level Category Range association with higher incidence of chronic pain  <18 kg/m2 Underweight   18.5-24.9 kg/m2 Ideal body weight   25-29.9 kg/m2 Overweight Increased incidence by 20%  30-34.9 kg/m2 Obese (Class I) Increased incidence by 68%  35-39.9 kg/m2 Severe obesity (Class II) Increased incidence by 136%  >40 kg/m2 Extreme obesity (Class III) Increased incidence by 254%   BMI Readings from Last 4 Encounters:  10/23/16 24.33 kg/m  10/16/16 24.33 kg/m  08/28/16 29.12 kg/m  08/01/16 29.19 kg/m   Wt Readings from Last 4 Encounters:  10/23/16 160 lb (72.6 kg)  10/16/16 160 lb (72.6 kg)  08/28/16 175 lb (79.4 kg)  08/01/16 175 lb 6.4 oz (79.6 kg)  Psych/Mental status: Alert, oriented x 3 (person, place, & time)       Eyes: PERLA Respiratory: No evidence of acute respiratory distress  Cervical Spine Area Exam  Skin & Axial Inspection:  Well healed scar from previous spine surgery detected Alignment: Symmetrical Functional ROM: Decreased ROM      Stability: No instability detected Muscle Tone/Strength: Functionally intact. No obvious neuro-muscular anomalies detected. Sensory (Neurological): Movement-associated discomfort Palpation: Complains of area being tender to palpation              Upper Extremity (UE) Exam    Side: Right upper extremity  Side: Left upper extremity  Skin & Extremity Inspection: Skin color, temperature, and hair growth are WNL. No peripheral edema or cyanosis. No masses, redness, swelling, asymmetry, or associated skin lesions. No contractures.  Skin & Extremity Inspection: Skin color, temperature, and hair growth are WNL. No peripheral edema or cyanosis. No masses, redness, swelling, asymmetry, or associated skin lesions. No contractures.  Functional ROM: Unrestricted ROM          Functional ROM: Unrestricted ROM          Muscle Tone/Strength: Functionally intact. No obvious neuro-muscular anomalies  detected.  Muscle Tone/Strength: Functionally intact. No obvious neuro-muscular anomalies detected.  Sensory (Neurological): Unimpaired          Sensory (Neurological): Unimpaired          Palpation: No palpable anomalies              Palpation: No palpable anomalies              Specialized Test(s): Deferred         Specialized Test(s): Deferred          Thoracic Spine Area Exam  Skin & Axial Inspection: No masses, redness, or swelling Alignment: Symmetrical Functional ROM: Unrestricted ROM Stability: No instability detected Muscle Tone/Strength: Functionally intact. No obvious neuro-muscular anomalies detected. Sensory (Neurological): Unimpaired Muscle strength & Tone: No palpable anomalies  Lumbar Spine Area Exam  Skin & Axial Inspection: Well healed scar from previous spine surgery detected Alignment: Symmetrical Functional ROM: Decreased ROM      Stability: No instability detected Muscle  Tone/Strength: Functionally intact. No obvious neuro-muscular anomalies detected. Sensory (Neurological): Movement-associated pain Palpation: Complains of area being tender to palpation       Provocative Tests: Lumbar Hyperextension and rotation test: Positive bilaterally for facet joint pain. Lumbar Lateral bending test: evaluation deferred today       Patrick's Maneuver: evaluation deferred today                    Gait & Posture Assessment  Ambulation: Unassisted Gait: Relatively normal for age and body habitus Posture: WNL   Lower Extremity Exam    Side: Right lower extremity  Side: Left lower extremity  Skin & Extremity Inspection: Skin color, temperature, and hair growth are WNL. No peripheral edema or cyanosis. No masses, redness, swelling, asymmetry, or associated skin lesions. No contractures.  Skin & Extremity Inspection: Skin color, temperature, and hair growth are WNL. No peripheral edema or cyanosis. No masses, redness, swelling, asymmetry, or associated skin lesions. No contractures.  Functional ROM: Unrestricted ROM          Functional ROM: Unrestricted ROM          Muscle Tone/Strength: Functionally intact. No obvious neuro-muscular anomalies detected.  Muscle Tone/Strength: Functionally intact. No obvious neuro-muscular anomalies detected.  Sensory (Neurological): Unimpaired  Sensory (Neurological): Unimpaired  Palpation: No palpable anomalies  Palpation: No palpable anomalies   Assessment  Primary Diagnosis & Pertinent Problem List: The primary encounter diagnosis was Chronic low back pain (Primary Area of Pain) (Bilateral) (R>L). Diagnoses of Lumbar spondylosis, DDD (degenerative disc disease), lumbar, Chronic lower extremity pain (Secondary Area of Pain) (Bilateral) (R>L), Failed back surgical syndrome, Chronic neck pain (Tertiary Area of Pain) (Left), Cervical spondylosis, DDD (degenerative disc disease), cervical, History of cervical spinal surgery, Neurogenic pain,  Neuropathic pain, Chronic musculoskeletal pain, Disorder of skeletal system, Pharmacologic therapy, Problems influencing health status, Cancer-related pain, Chronic pain syndrome, Presence of implanted infusion pump, Long term (current) use of opiate analgesic, Long term prescription opiate use, and Opiate use were also pertinent to this visit.  Visit Diagnosis (New problems to examiner): 1. Chronic low back pain (Primary Area of Pain) (Bilateral) (R>L)   2. Lumbar spondylosis   3. DDD (degenerative disc disease), lumbar   4. Chronic lower extremity pain (Secondary Area of Pain) (Bilateral) (R>L)   5. Failed back surgical syndrome   6. Chronic neck pain (Tertiary Area of Pain) (Left)   7. Cervical spondylosis   8. DDD (degenerative disc disease), cervical   9. History of  cervical spinal surgery   10. Neurogenic pain   11. Neuropathic pain   12. Chronic musculoskeletal pain   13. Disorder of skeletal system   14. Pharmacologic therapy   15. Problems influencing health status   16. Cancer-related pain   17. Chronic pain syndrome   18. Presence of implanted infusion pump   19. Long term (current) use of opiate analgesic   20. Long term prescription opiate use   21. Opiate use    Plan of Care (Initial workup plan)  Note: Mr. Axelson was reminded that as per protocol, today's visit has been an evaluation only. We have not taken over the patient's controlled substance management.  Problem-specific plan: No problem-specific Assessment & Plan notes found for this encounter.  Lab Orders     Compliance Drug Analysis, Ur     Comprehensive metabolic panel     C-reactive protein     Sedimentation rate     Magnesium     25-Hydroxyvitamin D Lcms D2+D3     Vitamin B12     Testosterone, Free, Total, SHBG  Imaging Orders     DG Cervical Spine Complete     DG Lumbar Spine Complete W/Bend  Referral Orders  No referral(s) requested today    Procedure Orders     PUMP REFILL    Pharmacotherapy (current): Medications ordered:  Meds ordered this encounter  Medications  . DISCONTD: Oxycodone HCl 10 MG TABS    Sig: Take 1-2 tablets (10-20 mg total) by mouth every 6 (six) hours as needed. Maximum of 7 tabs per day.    Dispense:  210 tablet    Refill:  0    Do not place this medication, or any other prescription from our practice, on "Automatic Refill". Patient may have prescription filled one day early if pharmacy is closed on scheduled refill date. Do not fill until: 10/17/16 To last until: 11/16/16   Medications administered during this visit: Mr. Gang had no medications administered during this visit.   Pharmacological management options:  Opioid Analgesics: The patient was informed that there is no guarantee that he would be a candidate for opioid analgesics. The decision will be made following CDC guidelines. This decision will be based on the results of diagnostic studies, as well as Mr. Skirvin risk profile.   Membrane stabilizer: To be determined at a later time  Muscle relaxant: To be determined at a later time  NSAID: To be determined at a later time  Other analgesic(s): To be determined at a later time   Interventional management options: Mr. Michiels was informed that there is no guarantee that he would be a candidate for interventional therapies. The decision will be based on the results of diagnostic studies, as well as Mr. Pozzi risk profile.  Procedure(s) under consideration:  Intrathecal pump refill  Diagnostic bilateral lumbar facet block  Possible bilateral lumbar facet RFA  Diagnostic caudal epidural steroid injection plus diagnostic epidurogram  Possible Racz procedure  Diagnostic left cervical epidural steroid injection  Diagnostic left cervical facet block  Possible left cervical facet RFA    Provider-requested follow-up: Return for Pump Refill (as per pump program).  Future Appointments Date Time Provider Vincent   10/31/2016 11:00 AM [facility] MOBILE [facility] [place]  11/04/2016 11:30 AM [facility] COVERING PROVIDER [facility] None  01/01/2017 11:45 AM Milinda Pointer, MD [facility] None    Primary Care Physician: [name], MD Location: [facility] Note by: Gaspar Cola, MD Date:  10/16/2016; Time: 12:10 PM

## 2016-10-16 NOTE — Progress Notes (Deleted)
Safety precautions to be maintained throughout the outpatient stay will include: orient to surroundings, keep bed in low position, maintain call bell within reach at all times, provide assistance with transfer out of bed and ambulation. Patient not able to read and/or write.

## 2016-10-23 ENCOUNTER — Ambulatory Visit (HOSPITAL_BASED_OUTPATIENT_CLINIC_OR_DEPARTMENT_OTHER): Payer: PPO | Admitting: Pain Medicine

## 2016-10-23 ENCOUNTER — Encounter: Payer: Self-pay | Admitting: Pain Medicine

## 2016-10-23 ENCOUNTER — Ambulatory Visit
Admission: RE | Admit: 2016-10-23 | Discharge: 2016-10-23 | Disposition: A | Discharge status: Home / Self Care | Payer: PPO | Source: Ambulatory Visit | Attending: Pain Medicine | Admitting: Pain Medicine

## 2016-10-23 VITALS — BP 110/63 | HR 59 | Temp 98.0°F | Resp 18 | Ht 68.0 in | Wt 160.0 lb

## 2016-10-23 DIAGNOSIS — M961 Postlaminectomy syndrome, not elsewhere classified: Secondary | ICD-10-CM

## 2016-10-23 DIAGNOSIS — M79604 Pain in right leg: Secondary | ICD-10-CM

## 2016-10-23 DIAGNOSIS — Z9689 Presence of other specified functional implants: Secondary | ICD-10-CM | POA: Insufficient documentation

## 2016-10-23 DIAGNOSIS — M5441 Lumbago with sciatica, right side: Secondary | ICD-10-CM

## 2016-10-23 DIAGNOSIS — G8929 Other chronic pain: Secondary | ICD-10-CM | POA: Diagnosis not present

## 2016-10-23 DIAGNOSIS — M79605 Pain in left leg: Secondary | ICD-10-CM

## 2016-10-23 DIAGNOSIS — M5442 Lumbago with sciatica, left side: Secondary | ICD-10-CM

## 2016-10-23 DIAGNOSIS — Z9889 Other specified postprocedural states: Secondary | ICD-10-CM

## 2016-10-23 DIAGNOSIS — M545 Low back pain: Secondary | ICD-10-CM | POA: Insufficient documentation

## 2016-10-23 DIAGNOSIS — Z95828 Presence of other vascular implants and grafts: Secondary | ICD-10-CM | POA: Diagnosis not present

## 2016-10-23 DIAGNOSIS — M542 Cervicalgia: Secondary | ICD-10-CM | POA: Diagnosis not present

## 2016-10-23 DIAGNOSIS — G894 Chronic pain syndrome: Secondary | ICD-10-CM

## 2016-10-23 LAB — COMPREHENSIVE METABOLIC PANEL
A/G RATIO: 1.9 (ref 1.2–2.2)
ALK PHOS: 47 IU/L (ref 39–117)
ALT: 8 IU/L (ref 0–44)
AST: 16 IU/L (ref 0–40)
Albumin: 4.4 g/dL (ref 3.6–4.8)
BUN/Creatinine Ratio: 11 (ref 10–24)
BUN: 10 mg/dL (ref 8–27)
Bilirubin Total: 0.3 mg/dL (ref 0.0–1.2)
CO2: 24 mmol/L (ref 20–29)
Calcium: 9.5 mg/dL (ref 8.6–10.2)
Chloride: 100 mmol/L (ref 96–106)
Creatinine, Ser: 0.89 mg/dL (ref 0.76–1.27)
GFR calc Af Amer: 104 mL/min/{1.73_m2} (ref >59)
GFR calc non Af Amer: 90 mL/min/{1.73_m2} (ref >59)
GLOBULIN, TOTAL: 2.3 g/dL (ref 1.5–4.5)
Glucose: 97 mg/dL (ref 65–99)
POTASSIUM: 5 mmol/L (ref 3.5–5.2)
SODIUM: 141 mmol/L (ref 134–144)
Total Protein: 6.7 g/dL (ref 6.0–8.5)

## 2016-10-23 LAB — 25-HYDROXY VITAMIN D LCMS D2+D3
25-Hydroxy, Vitamin D-2: <1 ng/mL
25-Hydroxy, Vitamin D-3: 47 ng/mL
25-Hydroxy, Vitamin D: 47 ng/mL

## 2016-10-23 LAB — MAGNESIUM: Magnesium: 2 mg/dL (ref 1.6–2.3)

## 2016-10-23 LAB — VITAMIN B12: VITAMIN B 12: 662 pg/mL (ref 232–1245)

## 2016-10-23 LAB — SEDIMENTATION RATE: SED RATE: 3 mm/h (ref 0–30)

## 2016-10-23 LAB — C-REACTIVE PROTEIN: CRP: 1 mg/L (ref 0.0–4.9)

## 2016-10-23 MED ORDER — OXYCODONE HCL 10 MG PO TABS: 10.0000 mg | ORAL_TABLET | ORAL | 0 refills | Status: DC | PRN

## 2016-10-23 NOTE — Progress Notes (Signed)
Safety precautions to be maintained throughout the outpatient stay will include: orient to surroundings, keep bed in low position, maintain call bell within reach at all times, provide assistance with transfer out of bed and ambulation.  

## 2016-10-23 NOTE — Patient Instructions (Signed)
You were given one prescription for Oxycodone

## 2016-10-23 NOTE — Progress Notes (Signed)
Nursing Pain Medication Assessment:  Safety precautions to be maintained throughout the outpatient stay will include: orient to surroundings, keep bed in low position, maintain call bell within reach at all times, provide assistance with transfer out of bed and ambulation.  Medication Inspection Compliance: Pill count conducted under aseptic conditions, in front of the patient. Neither the pills nor the bottle was removed from the patient's sight at any time. Once count was completed pills were immediately returned to the patient in their original bottle.  Medication: Oxycodone IR   Pill/Patch Count: 149 of 180 pills remain Pill/Patch Appearance: Markings consistent with prescribed medication Bottle Appearance: Standard pharmacy container. Clearly labeled. Filled Date: 09-13 / 2018 Last Medication intake:  Today

## 2016-10-23 NOTE — Progress Notes (Signed)
Patient's Name: Darrell White  MRN: [id number]  Referring Provider: [name], MD  DOB: 09-Sep-1950  PCP: [name], MD  DOS: 10/23/2016  Note by: Gaspar Cola, MD  Service setting: Ambulatory outpatient  Specialty: Interventional Pain Management  Patient type: Established  Location: [facility] (AMB) Pain Management Facility  Visit type: Interventional Procedure   Primary Reason for Visit: Interventional Pain Management Treatment. CC: Back Pain (low) and Leg Pain (bilateral left is worse)  Procedure:  Intrathecal Drug Delivery System (IDDS):  Type: Reservoir Refill [phone]) No rate change Region: Abdominal Laterality: Right  Type of Pump: Medtronic Synchromed II (MRI-compatible) Delivery Route: Intrathecal Type of Pain Treated: Neuropathic/Nociceptive Primary Medication Class: Opioid/opiate  Medication, Concentration, Infusion Program, & Delivery Rate: Please see scanned programming printout.  Indications: 1. Chronic low back pain (Primary Area of Pain) (Bilateral) (R>L)   2. Chronic lower extremity pain (Secondary Area of Pain) (Bilateral) (R>L)   3. Failed back surgical syndrome   4. Chronic pain syndrome   5. Presence of implanted infusion pump    Pain Assessment: Self-Reported Pain Score: 5 /10             Reported level is compatible with observation.        Intrathecal Pump Therapy Assessment  Manufacturer: Medtronic Synchromed II Type: Programmable Volume: 40 mL reservoir MRI compatibility: Yes   Drug content:  Primary Medication Class: Opioid Primary Medication: PF-Morphine 20 mg/mL Secondary Medication: None Other Medication: None   Programming:  Type: Simple continuous. See pump readout for details.   Changes:  Medication Change: None at this point Rate Change: No change in rate  Reported side-effects or adverse reactions: None reported  Effectiveness: Described as relatively effective, allowing for increase in activities of daily living  (ADL) Clinically meaningful improvement in function (CMIF): Sustained CMIF goals met  Plan: Pump refill today  Pre-op Assessment:  Mr. Wheller is a 66 y.o. (year old), male patient, seen today for interventional treatment. He  has a past surgical history that includes Mouth surgery; Cervical disc surgery; Shoulder arthroscopy; Back surgery; Spinal fusion; Spine surgery; Knee arthroscopy; Cataract extraction w/PHACO (Left, 04/09/2012); Direct laryngoscopy (N/A, 03/26/2016); Excision of tongue lesion (Right, 03/26/2016); and OTHER SURGICAL HISTORY. Mr. Lenhoff has a current medication list which includes the following prescription(s): aspirin, atorvastatin, docusate calcium, gabapentin, levothyroxine, metoprolol succinate, mirtazapine, omeprazole, oxycodone hcl, trazodone, and vitamin b-12. His primarily concern today is the Back Pain (low) and Leg Pain (bilateral left is worse)  Initial Vital Signs: Blood pressure 110/63, pulse (!) 59, temperature 98 F (36.7 C), resp. rate 18, height '5\' 8"'$  (1.727 m), weight 160 lb (72.6 kg), SpO2 99 %. BMI: Estimated body mass index is 24.33 kg/m as calculated from the following:   Height as of this encounter: '5\' 8"'$  (1.727 m).   Weight as of this encounter: 160 lb (72.6 kg).  Risk Assessment: Allergies: Reviewed. He has No Known Allergies.  Allergy Precautions: None required Coagulopathies: Reviewed. None identified.  Blood-thinner therapy: None at this time Active Infection(s): Reviewed. None identified. Mr. Umscheid is afebrile  Site Confirmation: Mr. Mckeithan was asked to confirm the procedure and laterality before marking the site Procedure checklist: Completed Consent: Before the procedure and under the influence of no sedative(s), amnesic(s), or anxiolytics, the patient was informed of the treatment options, risks and possible complications. To fulfill our ethical and legal obligations, as recommended by the American Medical Association's Code of Ethics, I have  informed the patient of my clinical  impression; the nature and purpose of the treatment or procedure; the risks, benefits, and possible complications of the intervention; the alternatives, including doing nothing; the risk(s) and benefit(s) of the alternative treatment(s) or procedure(s); and the risk(s) and benefit(s) of doing nothing.  Mr. Sanderfer was provided with information about the general risks and possible complications associated with most interventional procedures. These include, but are not limited to: failure to achieve desired goals, infection, bleeding, organ or nerve damage, allergic reactions, paralysis, and/or death.  In addition, he was informed of those risks and possible complications associated to this particular procedure, which include, but are not limited to: damage to the implant; failure to decrease pain; local, systemic, or serious CNS infections, intraspinal abscess with possible cord compression and paralysis, or life-threatening such as meningitis; bleeding; organ damage; nerve injury or damage with subsequent sensory, motor, and/or autonomic system dysfunction, resulting in transient or permanent pain, numbness, and/or weakness of one or several areas of the body; allergic reactions, either minor or major life-threatening, such as anaphylactic or anaphylactoid reactions.  Furthermore, Mr. Bramel was informed of those risks and complications associated with the medications. These include, but are not limited to: allergic reactions (i.e.: anaphylactic or anaphylactoid reactions); endorphine suppression; bradycardia and/or hypotension; water retention and/or peripheral vascular relaxation leading to lower extremity edema and possible stasis ulcers; respiratory depression and/or shortness of breath; decreased metabolic rate leading to weight gain; swelling or edema; medication-induced neural toxicity; particulate matter embolism and blood vessel occlusion with resultant organ, and/or  nervous system infarction; and/or intrathecal granuloma formation with possible spinal cord compression and permanent paralysis.  Before refilling the pump Mr. Shukla was informed that some of the medications used in the devise may not be FDA approved for such use and therefore it constitutes an off-label use of the medications.  Finally, he was informed that Medicine is not an exact science; therefore, there is also the possibility of unforeseen or unpredictable risks and/or possible complications that may result in a catastrophic outcome. The patient indicated having understood very clearly. We have given the patient no guarantees and we have made no promises. Enough time was given to the patient to ask questions, all of which were answered to the patient's satisfaction. Mr. Digioia has indicated that he wanted to continue with the procedure. Attestation: I, the ordering provider, attest that I have discussed with the patient the benefits, risks, side-effects, alternatives, likelihood of achieving goals, and potential problems during recovery for the procedure that I have provided informed consent. Date: 10/23/2016; Time: 1:33 PM  Pre-Procedure Preparation:  Monitoring: As per clinic protocol. Respiration, ETCO2, SpO2, BP, heart rate and rhythm monitor placed and checked for adequate function Safety Precautions: Patient was assessed for positional comfort and pressure points before starting the procedure. Time-out: I initiated and conducted the "Time-out" before starting the procedure, as per protocol. The patient was asked to participate by confirming the accuracy of the "Time Out" information. Verification of the correct person, site, and procedure were performed and confirmed by me, the nursing staff, and the patient. "Time-out" conducted as per Joint Commission's Universal Protocol (UP.01.01.01). "Time-out" Date & Time: 10/23/2016; 1340 hrs.  Description of Procedure Process:   Position:  Supine Target Area: Central-port of intrathecal pump. Approach: Anterior, 90 degree angle approach. Area Prepped: Entire Area around the pump implant. Prepping solution: ChloraPrep (2% chlorhexidine gluconate and 70% isopropyl alcohol) Safety Precautions: Aspiration looking for blood return was conducted prior to all injections. At no point did we inject any  substances, as a needle was being advanced. No attempts were made at seeking any paresthesias. Safe injection practices and needle disposal techniques used. Medications properly checked for expiration dates. SDV (single dose vial) medications used. Description of the Procedure: Protocol guidelines were followed. Two nurses trained to do implant refills were present during the entire procedure. The refill medication was checked by both healthcare providers as well as the patient. The patient was included in the "Time-out" to verify the medication. The patient was placed in position. The pump was identified. The area was prepped in the usual manner. The sterile template was positioned over the pump, making sure the side-port location matched that of the pump. Both, the pump and the template were held for stability. The needle provided in the Medtronic Kit was then introduced thru the center of the template and into the central port. The pump content was aspirated and discarded volume documented. The new medication was slowly infused into the pump, thru the filter, making sure to avoid overpressure of the device. The needle was then removed and the area cleansed, making sure to leave some of the prepping solution back to take advantage of its long term bactericidal properties. The pump was interrogated and programmed to reflect the correct medication, volume, and dosage. The program was printed and taken to the physician for approval. Once checked and signed by the physician, a copy was provided to the patient and another scanned into the EMR. Vitals:    10/23/16 1233  BP: 110/63  Pulse: (!) 59  Resp: 18  Temp: 98 F (36.7 C)  SpO2: 99%  Weight: 160 lb (72.6 kg)  Height: '5\' 8"'$  (1.727 m)    Start Time: 1345 hrs. End Time: 1354 hrs. Materials & Medications: Medtronic Refill Kit Medication(s): Please see chart orders for details.  Imaging Guidance:  Type of Imaging Technique: None used Indication(s): N/A Exposure Time: No patient exposure Contrast: None used. Fluoroscopic Guidance: N/A Ultrasound Guidance: N/A Interpretation: N/A  Antibiotic Prophylaxis:  Indication(s): None identified Antibiotic given: None  Post-operative Assessment:  EBL: None Complications: No immediate post-treatment complications observed by team, or reported by patient. Note: The patient tolerated the entire procedure well. A repeat set of vitals were taken after the procedure and the patient was kept under observation following institutional policy, for this type of procedure. Post-procedural neurological assessment was performed, showing return to baseline, prior to discharge. The patient was provided with post-procedure discharge instructions, including a section on how to identify potential problems. Should any problems arise concerning this procedure, the patient was given instructions to immediately contact us, at any time, without hesitation. In any case, we plan to contact the patient by telephone for a follow-up status report regarding this interventional procedure. Comments:  No additional relevant information.  Plan of Care  Disposition: Discharge home  Discharge Date & Time: 10/23/2016; 1405 hrs.   Physician-requested Follow-up:  Return in about 2 months (around 01/06/2017) for Pump Refill (as per pump program), Med-Mgmt by Dr. Dossie Arbour.  Future Appointments Date Time Provider [place]  10/31/2016 11:00 AM [facility] MOBILE [facility] [place]  11/04/2016 11:30 AM [facility] COVERING PROVIDER [facility] None  01/01/2017 11:45 AM Milinda Pointer, MD [facility] None   Imaging Orders  No imaging studies ordered today    Procedure Orders     PUMP REFILL     PUMP REFILL  Medications ordered for procedure: Meds ordered this encounter  Medications  . Oxycodone HCl 10 MG TABS    Sig:  Take 1 tablet (10 mg total) by mouth every 4 (four) hours as needed. Maximum of 6 tabs per day.    Dispense:  180 tablet    Refill:  0    Do not place this medication, or any other prescription from our practice, on "Automatic Refill". Patient may have prescription filled one day early if pharmacy is closed on scheduled refill date. Do not fill until: 12/11/16 To last until: 01/10/17   Medications administered: Mr. Guess had no medications administered during this visit.  See the medical record for exact dosing, route, and time of administration.  New Prescriptions   No medications on file   Primary Care Physician: [name], MD Location: [facility] Note by: Gaspar Cola, MD Date: 10/23/2016; Time: 2:31 PM  Disclaimer:  Medicine is not an Chief Strategy Officer. The only guarantee in medicine is that nothing is guaranteed. It is important to note that the decision to proceed with this intervention was based on the information collected from the patient. The Data and conclusions were drawn from the patient's questionnaire, the interview, and the physical examination. Because the information was provided in large part by the patient, it cannot be guaranteed that it has not been purposely or unconsciously manipulated. Every effort has been made to obtain as much relevant data as possible for this evaluation. It is important to note that the conclusions that lead to this procedure are derived in large part from the available data. Always take into account that the treatment will also be dependent on availability of resources and existing treatment guidelines, considered by other Pain Management Practitioners as  being common knowledge and practice, at the time of the intervention. For Medico-Legal purposes, it is also important to point out that variation in procedural techniques and pharmacological choices are the acceptable norm. The indications, contraindications, technique, and results of the above procedure should only be interpreted and judged by a Board-Certified Interventional Pain Specialist with extensive familiarity and expertise in the same exact procedure and technique.

## 2016-10-24 LAB — COMPLIANCE DRUG ANALYSIS, UR

## 2016-10-25 DIAGNOSIS — M4726 Other spondylosis with radiculopathy, lumbar region: Secondary | ICD-10-CM | POA: Insufficient documentation

## 2016-10-25 DIAGNOSIS — G893 Neoplasm related pain (acute) (chronic): Secondary | ICD-10-CM | POA: Insufficient documentation

## 2016-10-25 DIAGNOSIS — M5136 Other intervertebral disc degeneration, lumbar region: Secondary | ICD-10-CM | POA: Insufficient documentation

## 2016-10-25 DIAGNOSIS — M792 Neuralgia and neuritis, unspecified: Secondary | ICD-10-CM | POA: Insufficient documentation

## 2016-10-25 DIAGNOSIS — M5030 Other cervical disc degeneration, unspecified cervical region: Secondary | ICD-10-CM | POA: Insufficient documentation

## 2016-10-25 DIAGNOSIS — M899 Disorder of bone, unspecified: Secondary | ICD-10-CM | POA: Insufficient documentation

## 2016-10-25 DIAGNOSIS — Z789 Other specified health status: Secondary | ICD-10-CM | POA: Insufficient documentation

## 2016-10-25 DIAGNOSIS — G8929 Other chronic pain: Secondary | ICD-10-CM | POA: Insufficient documentation

## 2016-10-25 DIAGNOSIS — M47812 Spondylosis without myelopathy or radiculopathy, cervical region: Secondary | ICD-10-CM | POA: Insufficient documentation

## 2016-10-25 DIAGNOSIS — M7918 Myalgia, other site: Secondary | ICD-10-CM | POA: Insufficient documentation

## 2016-10-25 DIAGNOSIS — Z79899 Other long term (current) drug therapy: Secondary | ICD-10-CM | POA: Insufficient documentation

## 2016-10-28 MED FILL — Medication: INTRATHECAL | Qty: 1 | Status: AC

## 2016-10-29 DIAGNOSIS — Z510 Encounter for antineoplastic radiation therapy: Secondary | ICD-10-CM | POA: Diagnosis not present

## 2016-10-29 DIAGNOSIS — C01 Malignant neoplasm of base of tongue: Secondary | ICD-10-CM | POA: Diagnosis not present

## 2016-10-29 DIAGNOSIS — C3411 Malignant neoplasm of upper lobe, right bronchus or lung: Secondary | ICD-10-CM | POA: Diagnosis not present

## 2016-10-29 DIAGNOSIS — C029 Malignant neoplasm of tongue, unspecified: Secondary | ICD-10-CM | POA: Diagnosis not present

## 2016-10-31 ENCOUNTER — Ambulatory Visit (HOSPITAL_COMMUNITY): Payer: PPO

## 2016-11-04 ENCOUNTER — Ambulatory Visit (HOSPITAL_COMMUNITY): Payer: PPO

## 2016-11-14 ENCOUNTER — Encounter (HOSPITAL_COMMUNITY)
Admission: RE | Admit: 2016-11-14 | Discharge: 2016-11-14 | Disposition: A | Discharge status: Home / Self Care | Payer: PPO | Source: Ambulatory Visit | Attending: Oncology | Admitting: Oncology

## 2016-11-14 DIAGNOSIS — C3411 Malignant neoplasm of upper lobe, right bronchus or lung: Secondary | ICD-10-CM

## 2016-11-14 DIAGNOSIS — C029 Malignant neoplasm of tongue, unspecified: Secondary | ICD-10-CM | POA: Diagnosis not present

## 2016-11-14 DIAGNOSIS — C760 Malignant neoplasm of head, face and neck: Secondary | ICD-10-CM | POA: Diagnosis not present

## 2016-11-14 LAB — GLUCOSE, CAPILLARY: Glucose-Capillary: 97 mg/dL (ref 65–99)

## 2016-11-14 MED ORDER — FLUDEOXYGLUCOSE F - 18 (FDG) INJECTION
8.1100 | Freq: Once | INTRAVENOUS | Status: AC | PRN
  Administered 2016-11-14: 8.11 via INTRAVENOUS

## 2016-11-18 ENCOUNTER — Encounter (HOSPITAL_COMMUNITY): Payer: PPO | Attending: Oncology | Admitting: Oncology

## 2016-11-18 ENCOUNTER — Encounter (HOSPITAL_COMMUNITY): Payer: Self-pay | Admitting: Oncology

## 2016-11-18 ENCOUNTER — Encounter (HOSPITAL_COMMUNITY): Payer: PPO

## 2016-11-18 VITALS — BP 104/61 | HR 105 | Resp 16 | Ht 66.0 in | Wt 162.0 lb

## 2016-11-18 DIAGNOSIS — I10 Essential (primary) hypertension: Secondary | ICD-10-CM | POA: Insufficient documentation

## 2016-11-18 DIAGNOSIS — F329 Major depressive disorder, single episode, unspecified: Secondary | ICD-10-CM | POA: Insufficient documentation

## 2016-11-18 DIAGNOSIS — G8929 Other chronic pain: Secondary | ICD-10-CM | POA: Diagnosis not present

## 2016-11-18 DIAGNOSIS — K219 Gastro-esophageal reflux disease without esophagitis: Secondary | ICD-10-CM | POA: Insufficient documentation

## 2016-11-18 DIAGNOSIS — Z85810 Personal history of malignant neoplasm of tongue: Secondary | ICD-10-CM | POA: Diagnosis not present

## 2016-11-18 DIAGNOSIS — M545 Low back pain: Secondary | ICD-10-CM | POA: Diagnosis not present

## 2016-11-18 DIAGNOSIS — Q991 46, XX true hermaphrodite: Secondary | ICD-10-CM

## 2016-11-18 DIAGNOSIS — C01 Malignant neoplasm of base of tongue: Secondary | ICD-10-CM

## 2016-11-18 DIAGNOSIS — Z720 Tobacco use: Secondary | ICD-10-CM | POA: Diagnosis not present

## 2016-11-18 DIAGNOSIS — Z923 Personal history of irradiation: Secondary | ICD-10-CM | POA: Diagnosis not present

## 2016-11-18 DIAGNOSIS — C3411 Malignant neoplasm of upper lobe, right bronchus or lung: Secondary | ICD-10-CM | POA: Insufficient documentation

## 2016-11-18 DIAGNOSIS — R531 Weakness: Secondary | ICD-10-CM

## 2016-11-18 DIAGNOSIS — F17210 Nicotine dependence, cigarettes, uncomplicated: Secondary | ICD-10-CM | POA: Insufficient documentation

## 2016-11-18 DIAGNOSIS — G629 Polyneuropathy, unspecified: Secondary | ICD-10-CM | POA: Diagnosis not present

## 2016-11-18 DIAGNOSIS — F419 Anxiety disorder, unspecified: Secondary | ICD-10-CM | POA: Diagnosis not present

## 2016-11-18 DIAGNOSIS — E7800 Pure hypercholesterolemia, unspecified: Secondary | ICD-10-CM | POA: Diagnosis not present

## 2016-11-18 DIAGNOSIS — E039 Hypothyroidism, unspecified: Secondary | ICD-10-CM | POA: Diagnosis not present

## 2016-11-18 LAB — CBC WITH DIFFERENTIAL/PLATELET
BASOS ABS: 0 10*3/uL (ref 0.0–0.1)
BASOS PCT: 1 %
EOS PCT: 2 %
Eosinophils Absolute: 0.1 10*3/uL (ref 0.0–0.7)
HEMATOCRIT: 40.9 % (ref 39.0–52.0)
Hemoglobin: 13.6 g/dL (ref 13.0–17.0)
Lymphocytes Relative: 11 %
Lymphs Abs: 0.7 10*3/uL (ref 0.7–4.0)
MCH: 30.2 pg (ref 26.0–34.0)
MCHC: 33.3 g/dL (ref 30.0–36.0)
MCV: 90.7 fL (ref 78.0–100.0)
MONO ABS: 0.6 10*3/uL (ref 0.1–1.0)
MONOS PCT: 10 %
NEUTROS ABS: 4.5 10*3/uL (ref 1.7–7.7)
Neutrophils Relative %: 76 %
PLATELETS: 201 10*3/uL (ref 150–400)
RBC: 4.51 MIL/uL (ref 4.22–5.81)
RDW: 15.2 % (ref 11.5–15.5)
WBC: 5.9 10*3/uL (ref 4.0–10.5)

## 2016-11-18 LAB — BASIC METABOLIC PANEL
ANION GAP: 9 (ref 5–15)
BUN: 10 mg/dL (ref 6–20)
CALCIUM: 9.4 mg/dL (ref 8.9–10.3)
CO2: 27 mmol/L (ref 22–32)
CREATININE: 0.91 mg/dL (ref 0.61–1.24)
Chloride: 100 mmol/L — ABNORMAL LOW (ref 101–111)
GLUCOSE: 110 mg/dL — AB (ref 65–99)
Potassium: 4.1 mmol/L (ref 3.5–5.1)
Sodium: 136 mmol/L (ref 135–145)

## 2016-11-18 NOTE — Progress Notes (Signed)
PROGRESS NOTE   [name], Darrell Moment, MD [address] [place] [address]  DIAGNOSIS: Cancer Staging Primary cancer of right upper lobe of lung [facility]) Staging form: Lung, AJCC 7th Edition - Clinical stage from 06/05/2016: Stage IA (T1a, N0, M0) - Signed by [name], MD on 06/05/2016   The patient will receive 54 Gy in 3 fractions (July of 2018) History of tongue cancer 10+ years ago status post surgery CURRENT THERAPY: Status post SRS radiation for right upper lobe lung nodule, squamous cell carcinoma stage I History of present illness:    Squamous cell carcinoma of tongue ([place])   03/14/2016 Miscellaneous    Dr. Benjamine Mola (ENT) consult      03/14/2016 Procedure    Flex laryngoscopy-base of tongue with ulcerative mass in the right side.      03/26/2016 Procedure    Biopsy of right tongue by  Dr. Benjamine Mola      03/27/2016 Pathology Results    Invasive squamous cell carcinoma.      04/02/2016 Miscellaneous    Pathology contacted and requested HPV testing on malignancy specimen.      04/09/2016 Imaging    CT soft tissue neck: IMPRESSION: Enhancing mass base of tongue on the right measures 25 x 27 mm compatible with carcinoma. No pathologic adenopathy in the neck.  Extensive carotid atherosclerotic disease. There is significant carotid stenosis on the left. Carotid duplex or CTA recommended for further evaluation.      04/09/2016 Imaging    CT chest w contrast: IMPRESSION: 1. Irregular spiculated 2.0 cm apical right upper lobe pulmonary nodule, new since 07/31/2006 PET-CT, morphologically suspicious for malignancy, which could represent a metastasis versus a synchronous primary bronchogenic carcinoma. 2. Separate tiny 3 mm solid pulmonary nodule in the right upper lobe, also new since 2008, indeterminate metastasis. 3. Mild right hilar lymphadenopathy, cannot exclude nodal metastasis. 4. Consider PET-CT for further staging evaluation. 5. Mild radiation fibrosis in the medial apical  upper lobes bilaterally. 6. Additional findings include aortic atherosclerosis, 2 vessel coronary atherosclerosis and mild centrilobular emphysema.      05/03/2016 PET scan    Hypermetabolic mass (R) base of tongue and lingular tonsil c/w H&N cancer recurrence. No evidence of metastatic adenopathy in neck. Hypermetabolic nodule in RUL lung measuring 15 mm concerning for pulmonary mets vs primary lung carcinoma (favor primary lung cancer). Additional small sub-5 mm pulmonary nodules are favored to be benign.       04/2016 Pathology Results    Focal staining for p16 positive in approx 5-10% of squamous cells.       05/20/2016 Pathology Results    RUL lung mass biopsy: surgical path squamous cell carcinoma       INTERVAL HISTORY: Darrell White is a 66 y.o. 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 Also found to have a 1.5 cm RUL lung mass which was biopsied and came back as SCC. Given lack of nodal disease from the right tongue SCC, suspect this is a lung primary.  Patient is here for ongoing evaluation and treatment consideration.  Patient has finished SRS in July of 2018.  Continues to feel somewhat weak and tired.  Had a PET scan done here to discuss  the result.  Appetite has been fairly good but he continues to feel weak and tired.  He continues to smoke.  He has a chronic back pain being treated by pain clinic doctor with intrathecal pain medication. He has not lost any significant weight.  MEDICAL HISTORY: Past Medical History:  Diagnosis Date  . Anxiety   . Arthritis   . Cancer ([facility] [facility])    right base of tongue  . Depression   . GERD (gastroesophageal reflux disease)   . HOH  (hard of hearing)   . Hypercholesteremia   . Hypertension   . Hypothyroid   . Lung cancer ([place])    right upper lobe non small cell lung cancer  . Neuropathy   . 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 BACK SURGERY     had total of 7 surgeries with rods and plates  . CATARACT EXTRACTION W/PHACO Left 04/09/2012   Procedure: CATARACT EXTRACTION PHACO AND INTRAOCULAR LENS PLACEMENT (IOC);  Surgeon: Tonny Branch, MD;  Location: [facility];  Service: Ophthalmology;  Laterality: Left;  CDE: 12.44  . CERVICAL DISC SURGERY    . DIRECT LARYNGOSCOPY N/A 03/26/2016   Procedure: DIRECT LARYNGOSCOPY;  Surgeon: Leta Baptist, MD;  Location: [place];  Service: ENT;  Laterality: N/A;  . EXCISION OF TONGUE LESION Right 03/26/2016   Procedure: BIOPSY OF RIGHT TONGUE BASE MASS;  Surgeon: Leta Baptist, MD;  Location: [facility];  Service: ENT;  Laterality: Right;  . KNEE ARTHROSCOPY     right knee  . MOUTH SURGERY     removal of cancer  . OTHER SURGICAL HISTORY     insertion of pain pump  . SHOULDER ARTHROSCOPY     bilateral  . SPINAL FUSION    . SPINE SURGERY     insertion of morphine pump into spine    SOCIAL HISTORY: Social History   Social History  . Marital status: Married    Spouse name: N/A  . Number of children: N/A  . Years of education: N/A   Occupational History  . Not on file.   Social History Main Topics  . Smoking status: Current Every Day Smoker    Packs/day: 1.00    Years: 55.00    Types: Cigarettes  . Smokeless tobacco: Never Used  . Alcohol use No     Comment: H/O EtOHism drinking heavy- boubon/beer, quit ~ 2000  . Drug use: No  . Sexual activity: Yes    Birth control/ protection: None   Other Topics Concern  . Not on file   Social History Narrative  . No narrative on file    FAMILY HISTORY: Family History  Problem Relation Age of Onset  . Cancer Father     . Cancer Brother        Prostate cancer   Review of Systems  Constitutional: Negative.        No loss of appetite  HENT:       Dysphagia  Eyes: Negative.   Respiratory: Negative.   Cardiovascular: Negative.   Gastrointestinal: Negative.  Negative for abdominal pain.  Genitourinary: Negative.   Musculoskeletal: Negative.   Skin: Negative.   Neurological: Positive for headaches (R sided).  Endo/Heme/Allergies: Negative.   Psychiatric/Behavioral: Negative.   All other systems reviewed and are negative. 14 point review of systems was performed and is negative except as detailed under history of present illness and above  Continues to feel weak and tired. PHYSICAL EXAMINATION ECOG PERFORMANCE STATUS: 1 - Symptomatic but completely ambulatory  There were no vitals filed for this visit. Physical Exam  Constitutional: He is oriented to person, place, and time and well-developed, well-nourished, and in no distress.  HENT:  Head: Normocephalic and atraumatic.  Mouth/Throat: Oropharynx is clear and moist.  Eyes: Pupils are equal, round, and reactive to light. Conjunctivae and EOM are normal.  Neck: Normal range of motion. Neck supple.  Cardiovascular: Normal rate, regular rhythm and normal heart sounds.   Pulmonary/Chest: Effort normal and breath sounds normal.  Abdominal: Soft. Bowel sounds are normal.  Musculoskeletal: Normal range of motion.  Neurological: He is alert and oriented to person, place, and time. Gait normal.  Skin: Skin is warm and dry.  Nursing note and vitals reviewed.  LABORATORY DATA:  CBC    Component Value Date/Time   WBC 7.1 05/20/2016 1043   RBC 4.47 05/20/2016 1043   HGB 13.3 05/20/2016 1043   HCT 40.5 05/20/2016 1043   PLT 195 05/20/2016 1043   MCV 90.6 05/20/2016 1043   MCH 29.8 05/20/2016 1043   MCHC 32.8 05/20/2016 1043   RDW 14.0 05/20/2016 1043   LYMPHSABS 1.3 04/02/2016 1554   MONOABS 0.8 04/02/2016 1554   EOSABS 0.2 04/02/2016 1554    BASOSABS 0.0 04/02/2016 1554   CMP     Component Value Date/Time   NA 141 10/16/2016 1234   K 5.0 10/16/2016 1234   CL 100 10/16/2016 1234   CO2 24 10/16/2016 1234   GLUCOSE 97 10/16/2016 1234   GLUCOSE 95 04/02/2016 1554   BUN 10 10/16/2016 1234   CREATININE 0.89 10/16/2016 1234   CALCIUM 9.5 10/16/2016 1234   PROT 6.7 10/16/2016 1234   ALBUMIN 4.4 10/16/2016 1234   AST 16 10/16/2016 1234   ALT 8 10/16/2016 1234   ALKPHOS 47 10/16/2016 1234   BILITOT 0.3 10/16/2016 1234   GFRNONAA 90 10/16/2016 1234   GFRAA 104 10/16/2016 1234    RADIOGRAPHIC STUDIES: I have personally reviewed the radiological images as listed and agreed with the findings in the report.  PET Scan November 21, 2016 IIMPRESSION: 1. Complete interval response to therapy in the head and neck. The right base of tongue/tonsillar activity seen on the prior study has resolved completely with no uptake above background soft tissue levels on today's exam. 2. Interval decrease in hypermetabolism with associated decrease in size of the right apical pulmonary nodule, biopsy proven squamous cell carcinoma. 3. Interval development of a hypermetabolic focus in the right hilum. No underlying lymph node is evident on noncontrast CT and FDG uptake is just higher than background blood pool activity. CT chest with contrast may prove helpful to assess for lymphadenopathy. Attention to this area on follow-up recommended PATHOLOGY:   ASSESSMENT and THERAPY PLAN:  Squamous cell carcinoma of tongue ([facility]) Invasive squamous cell carcinoma of right tongue, status post biopsy by Dr. Benjamine Mola on 03/26/2016.  He has a spiculated 2 cm mass in the RUL and also some subcentimeter nodules in the RUL. Biopsy of RUL mass demonstrated squamous cell carcinoma.   Given lack of hypermetabolic neck nodal disease on PET, I suspect that this RUL mass is likely a second primary rather than metastatic disease from his tongue SCC. He likely has 2  early stage SCCs which can be treated with RT.  PLAN: I have discussed patient's PET and biopsy results in detail with him and his wife today. I have discussed with  them that he likely has 2 early stage SCCs which can be treated with RT.   Patient has been treated with S/P RT to the right lung mass PET scan has been reviewed independently so significant reduction in the size and FDG value.  There was some questionable hilar adenopathy which needs to be later on evaluated with CT scan with contrast. A repeat CT scan has been scheduling 3 Dennis 4 months.  Because of patient's lack of energy will check CBC and chemistry today  He is  again has been counseled regarding smoking and he absolutely does not want to quit smoking.  This note was electronically signed. Forest Gleason, MD 11/18/2016

## 2016-11-20 DIAGNOSIS — F17210 Nicotine dependence, cigarettes, uncomplicated: Secondary | ICD-10-CM | POA: Diagnosis not present

## 2016-11-20 DIAGNOSIS — J209 Acute bronchitis, unspecified: Secondary | ICD-10-CM | POA: Diagnosis not present

## 2016-11-20 DIAGNOSIS — Z6822 Body mass index (BMI) 22.0-22.9, adult: Secondary | ICD-10-CM | POA: Diagnosis not present

## 2016-11-20 DIAGNOSIS — J029 Acute pharyngitis, unspecified: Secondary | ICD-10-CM | POA: Diagnosis not present

## 2016-12-18 ENCOUNTER — Telehealth: Payer: Self-pay

## 2016-12-18 NOTE — Telephone Encounter (Signed)
Please schedule patient for a pump refill  the week of 02-10-17.  Please send me the date and time of appointment so that I can order the medicine.   Thank you!

## 2016-12-31 DIAGNOSIS — M47816 Spondylosis without myelopathy or radiculopathy, lumbar region: Secondary | ICD-10-CM | POA: Insufficient documentation

## 2016-12-31 DIAGNOSIS — Z451 Encounter for adjustment and management of infusion pump: Secondary | ICD-10-CM | POA: Insufficient documentation

## 2016-12-31 NOTE — Progress Notes (Addendum)
Patient's Name: Darrell White  MRN: [id number]  Referring Provider: [name], MD  DOB: [birth year]  PCP: [name], MD  DOS: 01/01/2017  Note by: Gaspar Cola, MD  Service setting: Ambulatory outpatient  Specialty: Interventional Pain Management  Location: [facility] (AMB) Pain Management Facility    Patient type: Established   Primary Reason(s) for Visit: Encounter for prescription drug management. (Level of risk: moderate)  CC: Back Pain (lower); Neck Pain (radiates to shoulders bilaterallyy); and Knee Pain (right)  HPI  Darrell White is a 66 y.o. year old, male patient, who comes today for a medication management evaluation. 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[place]); Presence of implanted infusion pump; Primary cancer of right upper lobe of lung ([place]); Carcinoma of base of tongue ([place]); Chronic low back pain (Primary Area of Pain) (Bilateral) (R>L); Chronic lower extremity pain (Secondary Area of Pain) (Bilateral) (R>L); Failed back surgical syndrome; Chronic neck pain (Tertiary Area of Pain) (Left); History of cervical spinal surgery; Chronic pain syndrome; Long term (current) use of opiate analgesic; Long term prescription opiate use; Opiate use; Cancer-related pain; Neurogenic pain; Neuropathic pain; Chronic musculoskeletal pain; Disorder of skeletal system; Pharmacologic therapy; Problems influencing health status; DDD (degenerative disc disease), cervical; DDD (degenerative disc disease), lumbar; Cervical spondylosis; Lumbar spondylosis; Adjustment and management of infusion pump; Lumbar facet syndrome (Bilateral); and Osteoarthritis of lumbar spine on their problem list. His primarily concern today is the Back Pain (lower); Neck Pain (radiates to shoulders bilaterallyy); and Knee Pain (right)  Pain Assessment: Location: Right, Left, Lower Back Radiating: hips/buttocks down back of legs to calves bilateral Onset: More than a month ago Duration: Chronic pain Quality: Throbbing,  Constant, Aching, Discomfort Severity: 10-Worst pain ever/10 (self-reported pain score)  Note: Reported level is inconsistent with clinical observations. Clinically the patient looks like a 2/10 A 2/10 is viewed as "Mild to Moderate" and described as noticeable and distracting. Impossible to hide from other people. More frequent flare-ups. Still possible to adapt and function close to normal. It can be very annoying and may have occasional stronger flare-ups. With discipline, patients may get used to it and adapt. Information on the proper use of the pain scale provided to the patient today. When using our objective Pain Scale, levels between 6 and 10/10 are said to belong in an emergency room, as it progressively worsens from a 6/10, described as severely limiting, requiring emergency care not usually available at an outpatient pain management facility. At a 6/10 level, communication becomes difficult and requires great effort. Assistance to reach the emergency department may be required. Facial flushing and profuse sweating along with potentially dangerous increases in heart rate and blood pressure will be evident. Effect on ADL: "I want to lay in the bed all day", unable to perform activities Timing: Constant Modifying factors: Medications before decreased, pain pump  Darrell White was last scheduled for an appointment on 10/23/2016 for medication management. During today's appointment we reviewed Darrell White chronic pain status, as well as his outpatient medication regimen. The patient continues to smoke despite fact that he has been instructed to quit. I have explained to the patient how the nicotine will accelerate the metabolism of his pain medication therefore causing it not to be as effective as it can. In addition, the damage calls today and her lining of the blood vessels by the nicotine is decreasing the blood flow to his spine further accelerating the degenerative disc disease. He also has emphysema  and this further  complicates the picture by decreasing his oxygen supply to muscles and increases the recommendation lactic acid, further irritating the muscles, and contributing to the pain.  The patient comes in today clinics today indicating that he has been experiencing more pain since we started the opioid taper. This is to be expected. However, I reminded the patient that that was part of the original treatment plan and that although he has the choice of refusing this plan I am not willing to increase his pain medication therefore he would simply have to look for a provider that would be willing to simply give him anything that he wants. This will not be the case here. I have made every effort to explain to him the purpose of what were doing but apparently there is a prior history of noncompliance.  Today I have offered to change the medication in his intrathecal pump and add a local anesthetic to make it more effective, but he has indicated that his insurance company declined to pay for the medication and that they would only pay for the one that he has. Although I have been doing intrathecal pump implants for over 26 years and managing them for that long, as a first time that I hear that the insurance company has any saying into a medical decision. His this was to be true, this would represent an attempt on the insurance company to practice medicine without a license. Furthermore if they attempt to render a decision using a Mudlogger", this would represent mild practice on his part as proper medicine and management of the patient requires face-to-face evaluation and interaction. In any case, I'm firmly confident that this is not the issue and that this is probably an in accurate statement by the patient, in an attempt to manipulate his opioid analgesics.  In addition, today I have offered to patient interventional therapies in the form of a diagnostic bilateral lumbar facet block to control his  pain so as to make it easier for him to not rely on opioids area however, despite the fact that he had been warned that our specialty is interventional pain management, he now is choosing to tell us that he does not want to have any injection therapies. Again this is a complete disregard towards the agreement that we had upon taking this patient for management. In any case, I will keep my side of the agreement in terms of the treatment plan and I will continue to taper the opioids that until we can bring him to a safer level, in accordance to CDC guidelines and recommendations.  The patient  reports that he does not use drugs. His body mass index is 23.57 kg/m.  Further details on both, my assessment(s), as well as the proposed treatment plan, please see below.  Controlled Substance Pharmacotherapy Assessment REMS (Risk Evaluation and Mitigation Strategy)  Analgesic: Oxycodone IR 20 mg 1 tablet by mouth every 6 hours (80 mg/day of oxycodone) (120 MME/day). As per the original treatment plan, we have been slowly tapering his opioids down with the intent to discontinue them for a period of at least 2-3 weeks allowing him to get them out of the system thereby lowering his tolerance. MME/day: 120 mg/day.  Ignatius Specking, RN  01/01/2017 12:07 PM  Sign at close encounter Nursing Pain Medication Assessment:  Safety precautions to be maintained throughout the outpatient stay will include: orient to surroundings, keep bed in low position, maintain call bell within reach at all times, provide assistance  with transfer out of bed and ambulation.  Medication Inspection Compliance: Pill count conducted under aseptic conditions, in front of the patient. Neither the pills nor the bottle was removed from the patient's sight at any time. Once count was completed pills were immediately returned to the patient in their original bottle.  Medication: See above Pill/Patch Count: 58 of 180 pills remain Pill/Patch  Appearance: Markings consistent with prescribed medication Bottle Appearance: Standard pharmacy container. Clearly labeled. Filled Date: 12/13/2016 Last Medication intake:  Today   Pharmacokinetics: Liberation and absorption (onset of action): WNL Distribution (time to peak effect): WNL Metabolism and excretion (duration of action): WNL         Pharmacodynamics: Desired effects: Analgesia: Darrell White reports >50% benefit. Functional ability: Patient reports that medication allows him to accomplish basic ADLs Clinically meaningful improvement in function (CMIF): Sustained CMIF goals met Perceived effectiveness: Described as relatively effective, allowing for increase in activities of daily living (ADL) Undesirable effects: Side-effects or Adverse reactions: None reported Monitoring: [place] PMP: Online review of the past 41-monthperiod conducted. Compliant with practice rules and regulations Last UDS on record: Summary  Date Value Ref Range Status  10/16/2016 FINAL  Final    Comment:    ==================================================================== [facility] COMP DRUG ANALYSIS,UR ==================================================================== Test                             Result       Flag       Units Drug Present and Declared for Prescription Verification   Oxycodone                      >3534        EXPECTED   ng/mg creat   Oxymorphone                    3447         EXPECTED   ng/mg creat   Noroxycodone                   >3534        EXPECTED   ng/mg creat   Noroxymorphone                 701          EXPECTED   ng/mg creat    Sources of oxycodone are scheduled prescription medications.    Oxymorphone, noroxycodone, and noroxymorphone are expected    metabolites of oxycodone. Oxymorphone is also available as a    scheduled prescription medication.   Gabapentin                     PRESENT      EXPECTED   Mirtazapine                    PRESENT      EXPECTED   Trazodone                       PRESENT      EXPECTED   1,3 chlorophenyl piperazine    PRESENT      EXPECTED    1,3-chlorophenyl piperazine is an expected metabolite of    trazodone.   Metoprolol                     PRESENT      EXPECTED Drug Present not Declared  for Prescription Verification   Morphine                       1194         UNEXPECTED ng/mg creat. The patient is actually getting the medication through the intrathecal pump where he receives a concentration of 20 mg/mL. Therefore, this would be an expected finding.   Normorphine                    26           UNEXPECTED ng/mg creat    Potential sources of large amounts of morphine in the absence of    codeine include administration of morphine or use of heroin.    Normorphine is an expected metabolite of morphine. Drug Absent but Declared for Prescription Verification   Salicylate                     Not Detected UNEXPECTED    Aspirin, as indicated in the declared medication list, is not    always detected even when used as directed. ==================================================================== Test                      Result    Flag   Units      Ref Range   Creatinine              283              mg/dL      >=20 ==================================================================== Declared Medications:  The flagging and interpretation on this report are based on the  following declared medications.  Unexpected results may arise from  inaccuracies in the declared medications.  **Note: The testing scope of this panel includes these medications:  Gabapentin  Metoprolol  Mirtazapine  Oxycodone  Trazodone  **Note: The testing scope of this panel does not include small to  moderate amounts of these reported medications:  Aspirin (Aspirin 81)  **Note: The testing scope of this panel does not include following  reported medications:  Atorvastatin  Cyanocobalamin  Docusate  Levothyroxine   Omeprazole ==================================================================== For clinical consultation, please call [phone]. ====================================================================    UDS interpretation: Unexpected findings not considered significantly abnormal          Medication Assessment Form: Reviewed. Patient indicates being compliant with therapy Treatment compliance: Compliant Risk Assessment Profile: Aberrant behavior: See prior evaluations. None observed or detected today Comorbid factors increasing risk of overdose: See prior notes. No additional risks detected today Risk of substance use disorder (SUD): Low Opioid Risk Tool - 01/01/17 1202      Family History of Substance Abuse   Alcohol  Negative    Illegal Drugs  Negative    Rx Drugs  Negative      Personal History of Substance Abuse   Alcohol  Negative    Illegal Drugs  Negative    Rx Drugs  Negative      History of Preadolescent Sexual Abuse   History of Preadolescent Sexual Abuse  Negative or Male      Psychological Disease   Psychological Disease  Negative    Depression  Negative      Total Score   Opioid Risk Tool Scoring  0    Opioid Risk Interpretation  Low Risk      ORT Scoring interpretation table:  Score <3 = Low Risk for SUD  Score between 4-7 = Moderate Risk for  SUD  Score >8 = High Risk for Opioid Abuse   Risk Mitigation Strategies:  Patient Counseling: Covered Patient-Prescriber Agreement (PPA): Present and active  Notification to other healthcare providers: Done  Pharmacologic Plan: No change in therapy, at this time  Laboratory Chemistry  Inflammation Markers (CRP: Acute Phase) (ESR: Chronic Phase) Lab Results  Component Value Date   CRP 1.0 10/16/2016   ESRSEDRATE 3 10/16/2016                 Rheumatology Markers No results found for: Elayne Guerin, [facility]              Renal Function Markers Lab Results  Component Value  Date   BUN 10 11/18/2016   CREATININE 0.91 11/18/2016   GFRAA >60 11/18/2016   GFRNONAA >60 11/18/2016                 Hepatic Function Markers Lab Results  Component Value Date   AST 16 10/16/2016   ALT 8 10/16/2016   ALBUMIN 4.4 10/16/2016   ALKPHOS 47 10/16/2016                 Electrolytes Lab Results  Component Value Date   NA 136 11/18/2016   K 4.1 11/18/2016   CL 100 (L) 11/18/2016   CALCIUM 9.4 11/18/2016   MG 2.0 10/16/2016                 Neuropathy Markers Lab Results  Component Value Date   VITAMINB12 662 10/16/2016                 Bone Pathology Markers Lab Results  Component Value Date   25OHVITD1 47 10/16/2016   25OHVITD2 <1.0 10/16/2016   25OHVITD3 47 10/16/2016                 Coagulation Parameters Lab Results  Component Value Date   INR 1.06 05/20/2016   LABPROT 13.8 05/20/2016   APTT 37 (H) 05/20/2016   PLT 201 11/18/2016                 Cardiovascular Markers Lab Results  Component Value Date   HGB 13.6 11/18/2016   HCT 40.9 11/18/2016                 CA Markers No results found for: CEA, CA125, LABCA2               Note: Lab results reviewed.  Recent Diagnostic Imaging Results  NM PET Image Restag (PS) Skull Base To Thigh CLINICAL DATA:  Subsequent treatment strategy for head and neck cancer.  EXAM: NUCLEAR MEDICINE PET SKULL BASE TO THIGH  TECHNIQUE: 8.1 mCi F-18 FDG was injected intravenously. Full-ring PET imaging was performed from the skull base to thigh after the radiotracer. CT data was obtained and used for attenuation correction and anatomic localization.  FASTING BLOOD GLUCOSE:  Value: 97 mg/dl  COMPARISON:  05/03/2016  FINDINGS: NECK: Marked interval response with complete resolution of hypermetabolism identified in the previous study at the right base of tongue and right lingular tonsil region. No hypermetabolic disease in the neck on today's exam.  CHEST: The right apical nodule is smaller on CT  imaging today 1.5 x 1.3 cm which compares to 1.9 x 1.7 cm when I remeasure at the same level on the prior exam. This nodule in the surrounding lung parenchyma again demonstrate FDG accumulation with SUV max = 2.5 today compared to 4.3 previously.  No other suspicious pulmonary nodule or mass.  A new hypermetabolic focus is identified in the right hilum on today's study with SUV max = 3.8. No discrete lymph node can be identified on the noncontrast CT imaging.  ABDOMEN/PELVIS: No abnormal hypermetabolic activity within the liver, pancreas, adrenal glands, or spleen. No hypermetabolic lymph nodes in the abdomen or pelvis.  Atherosclerotic calcification is noted in the abdominal aorta. Subcutaneous pump/battery pack identified right lower anterior abdominal wall with lumbosacral spinal catheter again noted.  SKELETON: No focal hypermetabolic activity to suggest skeletal metastasis.  IMPRESSION: 1. Complete interval response to therapy in the head and neck. The right base of tongue/tonsillar activity seen on the prior study has resolved completely with no uptake above background soft tissue levels on today's exam. 2. Interval decrease in hypermetabolism with associated decrease in size of the right apical pulmonary nodule, biopsy proven squamous cell carcinoma. 3. Interval development of a hypermetabolic focus in the right hilum. No underlying lymph node is evident on noncontrast CT and FDG uptake is just higher than background blood pool activity. CT chest with contrast may prove helpful to assess for lymphadenopathy. Attention to this area on follow-up recommended.  Electronically Signed   By: Misty Stanley M.D.   On: 11/14/2016 14:46  Complexity Note: Imaging results reviewed. Results shared with Darrell White, using Layman's terms.                         Meds   Current Outpatient Medications:  .  atorvastatin (LIPITOR) 20 MG tablet, Take 20 mg by mouth daily. , Disp: , Rfl:   .  Docusate Calcium (STOOL SOFTENER PO), Take 1 tablet by mouth daily as needed (constipation)., Disp: , Rfl:  .  gabapentin (NEURONTIN) 300 MG capsule, Take 300 mg by mouth 3 (three) times daily. , Disp: , Rfl:  .  levothyroxine (SYNTHROID, LEVOTHROID) 75 MCG tablet, Take 75 mcg by mouth daily before breakfast., Disp: , Rfl:  .  metoprolol succinate (TOPROL-XL) 25 MG 24 hr tablet, Take 25 mg by mouth at bedtime., Disp: , Rfl:  .  mirtazapine (REMERON) 45 MG tablet, Take 45 mg by mouth at bedtime., Disp: , Rfl:  .  omeprazole (PRILOSEC) 20 MG capsule, Take 20 mg by mouth daily as needed (heartburn)., Disp: , Rfl:  .  traZODone (DESYREL) 50 MG tablet, Take 150 mg by mouth at bedtime. , Disp: , Rfl:  .  vitamin B-12 (CYANOCOBALAMIN) 1000 MCG tablet, Take 2,000 mcg by mouth every evening., Disp: , Rfl:  .  aspirin 81 MG chewable tablet, Chew 81 mg by mouth daily., Disp: , Rfl:  .  [START ON 01/10/2017] oxyCODONE (OXY IR/ROXICODONE) 5 MG immediate release tablet, Take 1 tablet (5 mg total) by mouth 5 (five) times daily for 7 days. Max: 5/day, Disp: 35 tablet, Rfl: 0 .  [START ON 01/17/2017] oxyCODONE (OXY IR/ROXICODONE) 5 MG immediate release tablet, Take 1 tablet (5 mg total) by mouth 4 (four) times daily for 7 days. Max: 4/day, Disp: 28 tablet, Rfl: 0 .  [START ON 01/24/2017] oxyCODONE (OXY IR/ROXICODONE) 5 MG immediate release tablet, Take 1 tablet (5 mg total) by mouth 3 (three) times daily for 7 days. Max: 3/day, Disp: 21 tablet, Rfl: 0 .  [START ON 01/31/2017] oxyCODONE (OXY IR/ROXICODONE) 5 MG immediate release tablet, Take 1 tablet (5 mg total) by mouth 2 (two) times daily for 7 days. Max: 2/day, Disp: 14 tablet, Rfl: 0 .  [START  ON 02/07/2017] oxyCODONE (OXY IR/ROXICODONE) 5 MG immediate release tablet, Take 1 tablet (5 mg total) by mouth daily for 7 days. Max: 1/day, Disp: 7 tablet, Rfl: 0 .  [START ON 01/10/2017] Oxycodone HCl 10 MG TABS, Take 1 tablet (10 mg total) by mouth every 8 (eight) hours.  Maximum: 3/day., Disp: 126 tablet, Rfl: 0  ROS  Constitutional: Denies any fever or chills Gastrointestinal: No reported hemesis, hematochezia, vomiting, or acute GI distress Musculoskeletal: Denies any acute onset joint swelling, redness, loss of ROM, or weakness Neurological: No reported episodes of acute onset apraxia, aphasia, dysarthria, agnosia, amnesia, paralysis, loss of coordination, or loss of consciousness  Allergies  Darrell White has No Known Allergies.  [place]  Drug: Darrell White  reports that he does not use drugs. Alcohol:  reports that he does not drink alcohol. Tobacco:  reports that he has been smoking cigarettes.  He has a 55.00 pack-year smoking history. he has never used smokeless tobacco. 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 Surgical: Darrell White  has a past surgical history that includes Mouth surgery; Cervical disc surgery; Shoulder arthroscopy; Back surgery; Spinal fusion; Spine surgery; Knee arthroscopy; Cataract extraction w/PHACO (Left, 04/09/2012); Direct laryngoscopy (N/A, 03/26/2016); Excision of tongue lesion (Right, 03/26/2016); and OTHER SURGICAL HISTORY. Family: family history includes Cancer in his brother and father.  Constitutional Exam  General appearance: Well nourished, well developed, and well hydrated. In no apparent acute distress Vitals:   01/01/17 1155  BP: (!) 120/59  Pulse: 70  Resp: 16  Temp: 97.6 F (36.4 C)  SpO2: 99%  Weight: 155 lb (70.3 kg)  Height: '5\' 8"'$  (1.727 m)   BMI Assessment: Estimated body mass index is 23.57 kg/m as calculated from the following:   Height as of this encounter: '5\' 8"'$  (1.727 m).   Weight as of this encounter: 155 lb (70.3 kg).  BMI interpretation table: BMI level Category  Range association with higher incidence of chronic pain  <18 kg/m2 Underweight   18.5-24.9 kg/m2 Ideal body weight   25-29.9 kg/m2 Overweight Increased incidence by 20%  30-34.9 kg/m2 Obese (Class I) Increased incidence by 68%  35-39.9 kg/m2 Severe obesity (Class II) Increased incidence by 136%  >40 kg/m2 Extreme obesity (Class III) Increased incidence by 254%   BMI Readings from Last 4 Encounters:  01/01/17 23.57 kg/m  11/18/16 26.15 kg/m  10/23/16 24.33 kg/m  10/16/16 24.33 kg/m   Wt Readings from Last 4 Encounters:  01/01/17 155 lb (70.3 kg)  11/18/16 162 lb (73.5 kg)  10/23/16 160 lb (72.6 kg)  10/16/16 160 lb (72.6 kg)  Psych/Mental status: Alert, oriented x 3 (person, place, & time)       Eyes: PERLA Respiratory: No evidence of acute respiratory distress  Cervical Spine Area Exam  Skin & Axial Inspection: No masses, redness, edema, swelling, or associated skin lesions Alignment: Symmetrical Functional ROM: Unrestricted ROM      Stability: No instability detected Muscle Tone/Strength: Functionally intact. No obvious neuro-muscular anomalies detected. Sensory (Neurological): Unimpaired Palpation: No palpable anomalies              Upper Extremity (UE) Exam    Side: Right upper extremity  Side: Left upper extremity  Skin & Extremity Inspection: Skin color, temperature, and hair growth are WNL. No peripheral edema or cyanosis. No masses, redness, swelling,  asymmetry, or associated skin lesions. No contractures.  Skin & Extremity Inspection: Skin color, temperature, and hair growth are WNL. No peripheral edema or cyanosis. No masses, redness, swelling, asymmetry, or associated skin lesions. No contractures.  Functional ROM: Unrestricted ROM          Functional ROM: Unrestricted ROM          Muscle Tone/Strength: Functionally intact. No obvious neuro-muscular anomalies detected.  Muscle Tone/Strength: Functionally intact. No obvious neuro-muscular anomalies detected.  Sensory  (Neurological): Unimpaired          Sensory (Neurological): Unimpaired          Palpation: No palpable anomalies              Palpation: No palpable anomalies              Specialized Test(s): Deferred         Specialized Test(s): Deferred          Thoracic Spine Area Exam  Skin & Axial Inspection: No masses, redness, or swelling Alignment: Symmetrical Functional ROM: Unrestricted ROM Stability: No instability detected Muscle Tone/Strength: Functionally intact. No obvious neuro-muscular anomalies detected. Sensory (Neurological): Unimpaired Muscle strength & Tone: No palpable anomalies  Lumbar Spine Area Exam  Skin & Axial Inspection: Well healed scar from previous spine surgery detected Alignment: Symmetrical Functional ROM: Zero ROM      Stability: No instability detected Muscle Tone/Strength: Functionally intact. No obvious neuro-muscular anomalies detected. Sensory (Neurological): Movement-associated pain Palpation: Complains of area being tender to palpation       Provocative Tests: Lumbar Hyperextension and rotation test: Positive bilaterally for facet joint pain. Lumbar Lateral bending test: evaluation deferred today       Patrick's Maneuver: evaluation deferred today                    Gait & Posture Assessment  Ambulation: Unassisted Gait: Relatively normal for age and body habitus Posture: WNL   Lower Extremity Exam    Side: Right lower extremity  Side: Left lower extremity  Skin & Extremity Inspection: Skin color, temperature, and hair growth are WNL. No peripheral edema or cyanosis. No masses, redness, swelling, asymmetry, or associated skin lesions. No contractures.  Skin & Extremity Inspection: Skin color, temperature, and hair growth are WNL. No peripheral edema or cyanosis. No masses, redness, swelling, asymmetry, or associated skin lesions. No contractures.  Functional ROM: Unrestricted ROM          Functional ROM: Unrestricted ROM          Muscle Tone/Strength:  Functionally intact. No obvious neuro-muscular anomalies detected.  Muscle Tone/Strength: Functionally intact. No obvious neuro-muscular anomalies detected.  Sensory (Neurological): Unimpaired  Sensory (Neurological): Unimpaired  Palpation: No palpable anomalies  Palpation: No palpable anomalies   Assessment  Primary Diagnosis & Pertinent Problem List: The primary encounter diagnosis was Chronic pain syndrome. 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  Status Diagnosis  Controlled Controlled Controlled 1. Chronic pain syndrome   2. Cancer-related pain   3. Chronic low back pain (Primary Area of Pain) (Bilateral) (R>L)   4. Chronic lower extremity pain (Secondary Area of Pain) (Bilateral) (R>L)  5. 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 Failed back surgical syndrome   7. Presence of implanted infusion pump   8. Adjustment and management of infusion pump   9. Lumbar spondylosis   10. Lumbar facet syndrome (Bilateral)   11. Osteoarthritis of lumbar spine     Problems updated and reviewed during this visit: No problems updated. Plan of Care  Pharmacotherapy (Medications Ordered): Meds ordered this encounter  Medications  . Oxycodone HCl 10 MG TABS    Sig: Take 1 tablet (10 mg total) by mouth every 8 (eight) hours. Maximum: 3/day.    Dispense:  126 tablet    Refill:  0    Do not place this medication, or any other prescription from our practice, on "Automatic Refill". Patient may have prescription filled one day early if pharmacy is closed on scheduled refill  date. Do not fill until: 01/10/17 To last until: 02/09/17  . oxyCODONE (OXY IR/ROXICODONE) 5 MG immediate release tablet    Sig: Take 1 tablet (5 mg total) by mouth 5 (five) times daily for 7 days. Max: 5/day    Dispense:  35 tablet    Refill:  0    This prescription is part of a downward opioid taper. Fill instructions must be followed exactly as written to avoid withdrawal. Fill date: 01/10/17 To last until: 01/17/17  . oxyCODONE (OXY IR/ROXICODONE) 5 MG immediate release tablet    Sig: Take 1 tablet (5 mg total) by mouth 4 (four) times daily for 7 days. Max: 4/day    Dispense:  28 tablet    Refill:  0    This prescription is part of a downward opioid taper. Fill instructions must be followed exactly as written to avoid withdrawal. Fill date: 01/17/17 To last until: 01/24/17  . oxyCODONE (OXY IR/ROXICODONE) 5 MG immediate release tablet    Sig: Take 1 tablet (5 mg total) by mouth 3 (three) times daily for 7 days. Max: 3/day    Dispense:  21 tablet    Refill:  0    This prescription is part of a downward opioid taper. Fill instructions must be followed exactly as written to avoid withdrawal. Fill date: 01/24/17 To last until: 01/31/17  . oxyCODONE (OXY IR/ROXICODONE) 5 MG immediate release tablet    Sig: Take 1 tablet (5 mg total) by mouth 2 (two) times daily for 7 days. Max: 2/day    Dispense:  14 tablet    Refill:  0    This prescription is part of a downward opioid taper. Fill instructions must be followed exactly as written to avoid withdrawal. Fill date: 01/31/17 To last until: 02/07/17  . oxyCODONE (OXY IR/ROXICODONE) 5 MG immediate release tablet    Sig: Take 1 tablet (5 mg total) by mouth daily for 7 days. Max: 1/day    Dispense:  7 tablet    Refill:  0    This prescription is part of a downward opioid taper. Fill instructions must be followed exactly as written to avoid withdrawal. Fill date: 02/07/17 To last until: 02/14/17  This SmartLink is deprecated. Use AVSMEDLIST  instead to display the medication list for a patient. Medications administered today: Sabas Sous had no medications administered during this visit.   Procedure Orders     PUMP REFILL     LUMBAR FACET(MEDIAL BRANCH NERVE BLOCK) [facility] Lab Orders  No laboratory test(s) ordered today   Imaging Orders  No imaging studies ordered today   Referral Orders  No referral(s) requested today    Interventional management options: Planned, scheduled, and/or pending:   Continue tapering the oral opioids until we can completely stopped.    Considering:   Intrathecal pump refill  Diagnostic bilateral lumbar facet block  Possible bilateral lumbar facet RFA  Diagnostic caudal epidural steroid injection plus diagnostic epidurogram  Possible Racz procedure  Diagnostic left cervical epidural steroid injection  Diagnostic left cervical facet block  Possible left cervical facet RFA    Palliative PRN treatment(s):   Diagnostic bilateral lumbar facet block under fluoroscopic guidance and IV sedation    Provider-requested follow-up: Return for Pump Refill (as per pump program).  Future Appointments  Date Time Provider [place]  01/15/2017  1:30 PM Milinda Pointer, MD [facility] None  01/22/2017  1:00 PM Milinda Pointer, MD [facility] None  03/12/2017  9:00 AM [facility] 1 [facility] [place] [facility]  03/19/2017 10:30 AM [facility] COVERING PROVIDER [facility] None   Primary Care Physician: [name], MD Location: [facility] Note by: Gaspar Cola, MD Date: 01/01/2017; Time: 4:17 PM

## 2017-01-01 ENCOUNTER — Ambulatory Visit: Payer: PPO | Attending: Pain Medicine | Admitting: Pain Medicine

## 2017-01-01 ENCOUNTER — Other Ambulatory Visit: Payer: Self-pay

## 2017-01-01 VITALS — BP 120/59 | HR 70 | Temp 97.6°F | Resp 16 | Ht 68.0 in | Wt 155.0 lb

## 2017-01-01 DIAGNOSIS — Z79891 Long term (current) use of opiate analgesic: Secondary | ICD-10-CM | POA: Insufficient documentation

## 2017-01-01 DIAGNOSIS — F329 Major depressive disorder, single episode, unspecified: Secondary | ICD-10-CM | POA: Diagnosis not present

## 2017-01-01 DIAGNOSIS — M488X6 Other specified spondylopathies, lumbar region: Secondary | ICD-10-CM | POA: Insufficient documentation

## 2017-01-01 DIAGNOSIS — M7918 Myalgia, other site: Secondary | ICD-10-CM | POA: Diagnosis not present

## 2017-01-01 DIAGNOSIS — Z9689 Presence of other specified functional implants: Secondary | ICD-10-CM | POA: Insufficient documentation

## 2017-01-01 DIAGNOSIS — Z451 Encounter for adjustment and management of infusion pump: Secondary | ICD-10-CM

## 2017-01-01 DIAGNOSIS — M5136 Other intervertebral disc degeneration, lumbar region: Secondary | ICD-10-CM | POA: Diagnosis not present

## 2017-01-01 DIAGNOSIS — Z79899 Other long term (current) drug therapy: Secondary | ICD-10-CM | POA: Diagnosis not present

## 2017-01-01 DIAGNOSIS — M47812 Spondylosis without myelopathy or radiculopathy, cervical region: Secondary | ICD-10-CM | POA: Insufficient documentation

## 2017-01-01 DIAGNOSIS — Z79890 Hormone replacement therapy (postmenopausal): Secondary | ICD-10-CM | POA: Insufficient documentation

## 2017-01-01 DIAGNOSIS — Z7982 Long term (current) use of aspirin: Secondary | ICD-10-CM | POA: Insufficient documentation

## 2017-01-01 DIAGNOSIS — M5030 Other cervical disc degeneration, unspecified cervical region: Secondary | ICD-10-CM | POA: Insufficient documentation

## 2017-01-01 DIAGNOSIS — M961 Postlaminectomy syndrome, not elsewhere classified: Secondary | ICD-10-CM | POA: Diagnosis not present

## 2017-01-01 DIAGNOSIS — C01 Malignant neoplasm of base of tongue: Secondary | ICD-10-CM | POA: Diagnosis not present

## 2017-01-01 DIAGNOSIS — M79604 Pain in right leg: Secondary | ICD-10-CM | POA: Diagnosis not present

## 2017-01-01 DIAGNOSIS — Z789 Other specified health status: Secondary | ICD-10-CM | POA: Diagnosis not present

## 2017-01-01 DIAGNOSIS — G893 Neoplasm related pain (acute) (chronic): Secondary | ICD-10-CM | POA: Insufficient documentation

## 2017-01-01 DIAGNOSIS — F419 Anxiety disorder, unspecified: Secondary | ICD-10-CM | POA: Diagnosis not present

## 2017-01-01 DIAGNOSIS — M5441 Lumbago with sciatica, right side: Secondary | ICD-10-CM

## 2017-01-01 DIAGNOSIS — E7800 Pure hypercholesterolemia, unspecified: Secondary | ICD-10-CM | POA: Insufficient documentation

## 2017-01-01 DIAGNOSIS — M4726 Other spondylosis with radiculopathy, lumbar region: Secondary | ICD-10-CM | POA: Diagnosis not present

## 2017-01-01 DIAGNOSIS — M79605 Pain in left leg: Secondary | ICD-10-CM

## 2017-01-01 DIAGNOSIS — G629 Polyneuropathy, unspecified: Secondary | ICD-10-CM | POA: Insufficient documentation

## 2017-01-01 DIAGNOSIS — F17210 Nicotine dependence, cigarettes, uncomplicated: Secondary | ICD-10-CM | POA: Diagnosis not present

## 2017-01-01 DIAGNOSIS — I10 Essential (primary) hypertension: Secondary | ICD-10-CM | POA: Insufficient documentation

## 2017-01-01 DIAGNOSIS — E039 Hypothyroidism, unspecified: Secondary | ICD-10-CM | POA: Insufficient documentation

## 2017-01-01 DIAGNOSIS — M545 Low back pain: Secondary | ICD-10-CM | POA: Insufficient documentation

## 2017-01-01 DIAGNOSIS — K219 Gastro-esophageal reflux disease without esophagitis: Secondary | ICD-10-CM | POA: Insufficient documentation

## 2017-01-01 DIAGNOSIS — M5442 Lumbago with sciatica, left side: Secondary | ICD-10-CM

## 2017-01-01 DIAGNOSIS — Z95828 Presence of other vascular implants and grafts: Secondary | ICD-10-CM

## 2017-01-01 DIAGNOSIS — M542 Cervicalgia: Secondary | ICD-10-CM | POA: Diagnosis not present

## 2017-01-01 DIAGNOSIS — G894 Chronic pain syndrome: Secondary | ICD-10-CM | POA: Diagnosis not present

## 2017-01-01 DIAGNOSIS — C3411 Malignant neoplasm of upper lobe, right bronchus or lung: Secondary | ICD-10-CM | POA: Insufficient documentation

## 2017-01-01 DIAGNOSIS — G8929 Other chronic pain: Secondary | ICD-10-CM

## 2017-01-01 DIAGNOSIS — M47816 Spondylosis without myelopathy or radiculopathy, lumbar region: Secondary | ICD-10-CM | POA: Diagnosis not present

## 2017-01-01 DIAGNOSIS — H9190 Unspecified hearing loss, unspecified ear: Secondary | ICD-10-CM | POA: Insufficient documentation

## 2017-01-01 MED ORDER — OXYCODONE HCL 5 MG PO TABS: 5.0000 mg | ORAL_TABLET | Freq: Every day | ORAL | 0 refills | Status: DC

## 2017-01-01 MED ORDER — OXYCODONE HCL 5 MG PO TABS: 5.0000 mg | ORAL_TABLET | Freq: Four times a day (QID) | ORAL | 0 refills | Status: DC

## 2017-01-01 MED ORDER — OXYCODONE HCL 10 MG PO TABS: 10.0000 mg | ORAL_TABLET | Freq: Three times a day (TID) | ORAL | 0 refills | Status: DC

## 2017-01-01 MED ORDER — OXYCODONE HCL 5 MG PO TABS: 5.0000 mg | ORAL_TABLET | Freq: Three times a day (TID) | ORAL | 0 refills | Status: DC

## 2017-01-01 MED ORDER — OXYCODONE HCL 5 MG PO TABS: 5.0000 mg | ORAL_TABLET | Freq: Two times a day (BID) | ORAL | 0 refills | Status: DC

## 2017-01-01 NOTE — Patient Instructions (Addendum)
____________________________________________________________________________________________  Preparing for Procedure with Sedation Instructions: . Oral Intake: Do not eat or drink anything for at least 8 hours prior to your procedure. . Transportation: Public transportation is not allowed. Bring an adult driver. The driver must be physically present in our waiting room before any procedure can be started. Marland Kitchen Physical Assistance: Bring an adult physically capable of assisting you, in the event you need help. This adult should keep you company at home for at least 6 hours after the procedure. . Blood Pressure Medicine: Take your blood pressure medicine with a sip of water the morning of the procedure. . Blood thinners:  . Diabetics on insulin: Notify the staff so that you can be scheduled 1st case in the morning. If your diabetes requires high dose insulin, take only  of your normal insulin dose the morning of the procedure and notify the staff that you have done so. . Preventing infections: Shower with an antibacterial soap the morning of your procedure. . Build-up your immune system: Take 1000 mg of Vitamin C with every meal (3 times a day) the day prior to your procedure. Marland Kitchen Antibiotics: Inform the staff if you have a condition or reason that requires you to take antibiotics before dental procedures. . Pregnancy: If you are pregnant, call and cancel the procedure. . Sickness: If you have a cold, fever, or any active infections, call and cancel the procedure. . Arrival: You must be in the facility at least 30 minutes prior to your scheduled procedure. . Children: Do not bring children with you. . Dress appropriately: Bring dark clothing that you would not mind if they get stained. . Valuables: Do not bring any jewelry or valuables. Procedure appointments are reserved for interventional treatments only. Marland Kitchen No Prescription Refills. . No medication changes will be discussed during procedure  appointments. . No disability issues will be discussed. ____________________________________________________________________________________________   ____________________________________________________________________________________________  Smokers present with more severe and extended chronic pain outcomes and have a higher frequency of prescription opioid use. Current tobacco smoking is a strong predictor of risk for nonmedical use of prescription opioids. Opioid and nicotinic-cholinergic neurotransmitter systems interact in important ways to modulate opioid and nicotine effects: dopamine release induced by nicotine is dependent on facilitation by the opioid system, and the nicotinic-acetylcholine system modulates self-administration of several classes of abused drugs-including opioids. Nicotine can serve as a prime for the use of other drugs, which in the case of the opioid system may be bidirectional. Opioids and compounds in tobacco, including nicotine, are metabolized by the cytochrome P450 enzyme system, but the metabolism of opioids and tobacco products can be complicated. Accordingly, drug interactions are possible but not always clear.  ____________________________________________________________________________________________ ____________________________________________________________________________________________  Pain Scale  Introduction: The pain score used by this practice is the Verbal Numerical Rating Scale (VNRS-11). This is an 11-point scale. It is for adults and children 10 years or older. There are significant differences in how the pain score is reported, used, and applied. Forget everything you learned in the past and learn this scoring system.  General Information: The scale should reflect your current level of pain. Unless you are specifically asked for the level of your worst pain, or your average pain. If you are asked for one of these two, then it should be  understood that it is over the past 24 hours.  Basic Activities of Daily Living (ADL): Personal hygiene, dressing, eating, transferring, and using restroom.  Instructions: Most patients tend to report their level of pain as  a combination of two factors, their physical pain and their psychosocial pain. This last one is also known as "suffering" and it is reflection of how physical pain affects you socially and psychologically. From now on, report them separately. From this point on, when asked to report your pain level, report only your physical pain. Use the following table for reference.  Pain Clinic Pain Levels (0-5/10)  Pain Level Score  Description  No Pain 0   Mild pain 1 Nagging, annoying, but does not interfere with basic activities of daily living (ADL). Patients are able to eat, bathe, get dressed, toileting (being able to get on and off the toilet and perform personal hygiene functions), transfer (move in and out of bed or a chair without assistance), and maintain continence (able to control bladder and bowel functions). Blood pressure and heart rate are unaffected. A normal heart rate for a healthy adult ranges from 60 to 100 bpm (beats per minute).   Mild to moderate pain 2 Noticeable and distracting. Impossible to hide from other people. More frequent flare-ups. Still possible to adapt and function close to normal. It can be very annoying and may have occasional stronger flare-ups. With discipline, patients may get used to it and adapt.   Moderate pain 3 Interferes significantly with activities of daily living (ADL). It becomes difficult to feed, bathe, get dressed, get on and off the toilet or to perform personal hygiene functions. Difficult to get in and out of bed or a chair without assistance. Very distracting. With effort, it can be ignored when deeply involved in activities.   Moderately severe pain 4 Impossible to ignore for more than a few minutes. With effort, patients may still  be able to manage work or participate in some social activities. Very difficult to concentrate. Signs of autonomic nervous system discharge are evident: dilated pupils (mydriasis); mild sweating (diaphoresis); sleep interference. Heart rate becomes elevated (>115 bpm). Diastolic blood pressure (lower number) rises above 100 mmHg. Patients find relief in laying down and not moving.   Severe pain 5 Intense and extremely unpleasant. Associated with frowning face and frequent crying. Pain overwhelms the senses.  Ability to do any activity or maintain social relationships becomes significantly limited. Conversation becomes difficult. Pacing back and forth is common, as getting into a comfortable position is nearly impossible. Pain wakes you up from deep sleep. Physical signs will be obvious: pupillary dilation; increased sweating; goosebumps; brisk reflexes; cold, clammy hands and feet; nausea, vomiting or dry heaves; loss of appetite; significant sleep disturbance with inability to fall asleep or to remain asleep. When persistent, significant weight loss is observed due to the complete loss of appetite and sleep deprivation.  Blood pressure and heart rate becomes significantly elevated. Caution: If elevated blood pressure triggers a pounding headache associated with blurred vision, then the patient should immediately seek attention at an urgent or emergency care unit, as these may be signs of an impending stroke.    Emergency Department Pain Levels (6-10/10)  Emergency Room Pain 6 Severely limiting. Requires emergency care and should not be seen or managed at an outpatient pain management facility. Communication becomes difficult and requires great effort. Assistance to reach the emergency department may be required. Facial flushing and profuse sweating along with potentially dangerous increases in heart rate and blood pressure will be evident.   Distressing pain 7 Self-care is very difficult. Assistance is  required to transport, or use restroom. Assistance to reach the emergency department will be required. Tasks requiring  coordination, such as bathing and getting dressed become very difficult.   Disabling pain 8 Self-care is no longer possible. At this level, pain is disabling. The individual is unable to do even the most "basic" activities such as walking, eating, bathing, dressing, transferring to a bed, or toileting. Fine motor skills are lost. It is difficult to think clearly.   Incapacitating pain 9 Pain becomes incapacitating. Thought processing is no longer possible. Difficult to remember your own name. Control of movement and coordination are lost.   The worst pain imaginable 10 At this level, most patients pass out from pain. When this level is reached, collapse of the autonomic nervous system occurs, leading to a sudden drop in blood pressure and heart rate. This in turn results in a temporary and dramatic drop in blood flow to the brain, leading to a loss of consciousness. Fainting is one of the body's self defense mechanisms. Passing out puts the brain in a calmed state and causes it to shut down for a while, in order to begin the healing process.    Summary: 1. Refer to this scale when providing [place] with your pain level. 2. Be accurate and careful when reporting your pain level. This will help with your care. 3. Over-reporting your pain level will lead to loss of credibility. 4. Even a level of 1/10 means that there is pain and will be treated at our facility. 5. High, inaccurate reporting will be documented as "Symptom Exaggeration", leading to loss of credibility and suspicions of possible secondary gains such as obtaining more narcotics, or wanting to appear disabled, for fraudulent reasons. 6. Only pain levels of 5 or below will be seen at our facility. 7. Pain levels of 6 and above will be sent to the Emergency Department and the appointment  cancelled. ____________________________________________________________________________________________

## 2017-01-01 NOTE — Progress Notes (Signed)
Nursing Pain Medication Assessment:  Safety precautions to be maintained throughout the outpatient stay will include: orient to surroundings, keep bed in low position, maintain call bell within reach at all times, provide assistance with transfer out of bed and ambulation.  Medication Inspection Compliance: Pill count conducted under aseptic conditions, in front of the patient. Neither the pills nor the bottle was removed from the patient's sight at any time. Once count was completed pills were immediately returned to the patient in their original bottle.  Medication: See above Pill/Patch Count: 58 of 180 pills remain Pill/Patch Appearance: Markings consistent with prescribed medication Bottle Appearance: Standard pharmacy container. Clearly labeled. Filled Date: 11/09 / 2018 Last Medication intake:  Today

## 2017-01-02 ENCOUNTER — Other Ambulatory Visit: Payer: Self-pay

## 2017-01-02 MED ORDER — PAIN MANAGEMENT IT PUMP REFILL: 1.0000 | Freq: Once | INTRATHECAL | 0 refills | Status: AC

## 2017-01-03 ENCOUNTER — Ambulatory Visit: Payer: Self-pay | Admitting: *Deleted

## 2017-01-06 ENCOUNTER — Other Ambulatory Visit: Payer: Self-pay | Admitting: *Deleted

## 2017-01-06 ENCOUNTER — Ambulatory Visit: Payer: Self-pay | Admitting: *Deleted

## 2017-01-06 ENCOUNTER — Encounter: Payer: Self-pay | Admitting: *Deleted

## 2017-01-06 NOTE — Patient Outreach (Signed)
[facility]A High Risk Screening call completed. 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 He does not have any [facility] Care Management needs.  Eulah Pont. Myrtie Neither, MSN, [facility] Gerontological Nurse Practitioner [facility] Care Management [phone]

## 2017-01-10 ENCOUNTER — Other Ambulatory Visit: Payer: Self-pay | Admitting: *Deleted

## 2017-01-15 ENCOUNTER — Ambulatory Visit: Payer: PPO | Attending: Pain Medicine | Admitting: Pain Medicine

## 2017-01-15 ENCOUNTER — Other Ambulatory Visit: Payer: Self-pay

## 2017-01-15 ENCOUNTER — Other Ambulatory Visit: Payer: Self-pay | Admitting: Pain Medicine

## 2017-01-15 ENCOUNTER — Encounter: Payer: Self-pay | Admitting: Pain Medicine

## 2017-01-15 VITALS — BP 109/61 | HR 50 | Temp 97.6°F | Resp 16 | Ht 68.0 in | Wt 155.0 lb

## 2017-01-15 DIAGNOSIS — M79605 Pain in left leg: Secondary | ICD-10-CM

## 2017-01-15 DIAGNOSIS — M5442 Lumbago with sciatica, left side: Secondary | ICD-10-CM

## 2017-01-15 DIAGNOSIS — M542 Cervicalgia: Secondary | ICD-10-CM

## 2017-01-15 DIAGNOSIS — G894 Chronic pain syndrome: Secondary | ICD-10-CM

## 2017-01-15 DIAGNOSIS — Z79891 Long term (current) use of opiate analgesic: Secondary | ICD-10-CM

## 2017-01-15 DIAGNOSIS — F1190 Opioid use, unspecified, uncomplicated: Secondary | ICD-10-CM

## 2017-01-15 DIAGNOSIS — G8929 Other chronic pain: Secondary | ICD-10-CM

## 2017-01-15 DIAGNOSIS — M79604 Pain in right leg: Secondary | ICD-10-CM

## 2017-01-15 DIAGNOSIS — M5441 Lumbago with sciatica, right side: Secondary | ICD-10-CM

## 2017-01-15 DIAGNOSIS — M961 Postlaminectomy syndrome, not elsewhere classified: Secondary | ICD-10-CM

## 2017-01-15 MED ORDER — OXYCODONE HCL 5 MG PO TABS: 5.0000 mg | ORAL_TABLET | Freq: Four times a day (QID) | ORAL | 0 refills | Status: DC

## 2017-01-15 MED ORDER — OXYCODONE HCL 5 MG PO TABS: 5.0000 mg | ORAL_TABLET | Freq: Two times a day (BID) | ORAL | 0 refills | Status: DC

## 2017-01-15 MED ORDER — OXYCODONE HCL 5 MG PO TABS: 5.0000 mg | ORAL_TABLET | Freq: Three times a day (TID) | ORAL | 0 refills | Status: DC

## 2017-01-15 MED ORDER — OXYCODONE HCL 5 MG PO TABS: 5.0000 mg | ORAL_TABLET | Freq: Every day | ORAL | 0 refills | Status: DC

## 2017-01-15 NOTE — Progress Notes (Unsigned)
Patient's Name: Darrell White  MRN: [id number]  Referring Provider: No ref. provider found  DOB: 1950/06/10  PCP: [name], MD  DOS: 01/15/2017  Note by: Gaspar Cola, MD  Service setting: Ambulatory outpatient  Specialty: Interventional Pain Management  Location: [facility] (AMB) Pain Management Facility    Patient type: Established   Primary Reason(s) for Visit: Encounter for prescription drug management. (Level of risk: moderate)  CC: No chief complaint on file.  HPI  Darrell White is a 66 y.o. year old, male patient, who comes today for a medication management evaluation. 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[place]); Presence of implanted infusion pump; Primary cancer of right upper lobe of lung ([place]); Carcinoma of base of tongue ([place]); Chronic low back pain (Primary Area of Pain) (Bilateral) (R>L); Chronic lower extremity pain (Secondary Area of Pain) (Bilateral) (R>L); Failed back surgical syndrome; Chronic neck pain (Tertiary Area of Pain) (Left); History of cervical spinal surgery; Chronic pain syndrome; Long term (current) use of opiate analgesic; Long term prescription opiate use; Opiate use; Cancer-related pain; Neurogenic pain; Neuropathic pain; Chronic musculoskeletal pain; Disorder of skeletal system; Pharmacologic therapy; Problems influencing health status; DDD (degenerative disc disease), cervical; DDD (degenerative disc disease), lumbar; Cervical spondylosis; Lumbar spondylosis; Adjustment and management of infusion pump; Lumbar facet syndrome (Bilateral); and Osteoarthritis of lumbar spine on their problem list. His primarily concern today is the No chief complaint on file.  Today there was a screw-up with regards to the scheduling of this patient's visit. It was my understanding in that of the patient that he was coming in for an intrathecal pump refill. However, it would appear that the medicine was not ordered as it should've. Because this is entirely our fault and it is the  second time that this happens, we will not be billing this patient for today's visit. We will make every effort possible to make sure that this does not happen again.  Controlled Substance Pharmacotherapy Assessment REMS (Risk Evaluation and Mitigation Strategy)  Analgesic: Oxycodone IR 10 mg every 8 hours + oxycodone IR 5 mg 5 times a day (55 mg/day of oxycodone). We are currently tapering this patient at a rate of 5 mg per week. MME/day: 82.5 mg/day.   Pharmacologic Plan: Continue tapering oral opioids down until we're completely off of the oxycodone. We should be able to control his pain with the intrathecal pump. We are currently exploring the possibility of having a local anesthetic to the pump to increase its analgesic effect we have already talked to the patient about the off label use of local anesthetics in the intrathecal pump and he indicated that as long as his insurance pays for it, he would be okay with it.  Assessment  Primary Diagnosis & Pertinent Problem List: The primary encounter diagnosis was Chronic pain syndrome. Diagnoses of Chronic low back pain (Primary Area of Pain) (Bilateral) (R>L), Chronic lower extremity pain (Secondary Area of Pain) (Bilateral) (R>L), and Failed back surgical syndrome were also pertinent to this visit.  Status Diagnosis  Controlled Persistent Persistent 1. Chronic pain syndrome   2. Chronic low back pain (Primary Area of Pain) (Bilateral) (R>L)   3. Chronic lower extremity pain (Secondary Area of Pain) (Bilateral) (R>L)   4. Failed back surgical syndrome     Problems updated and reviewed during this visit:  Plan of Care  Pharmacotherapy (Medications Ordered): Meds ordered this encounter  Medications  . oxyCODONE (OXY IR/ROXICODONE) 5 MG immediate release tablet    Sig:  Take 1 tablet (5 mg total) by mouth daily for 7 days. Max: 1/day    Dispense:  7 tablet    Refill:  0    This prescription is part of a downward opioid taper. Fill  instructions must be followed exactly as written to avoid withdrawal. Fill date: 03/21/17 To last until: 03/28/17  . oxyCODONE (OXY IR/ROXICODONE) 5 MG immediate release tablet    Sig: Take 1 tablet (5 mg total) by mouth 2 (two) times daily for 7 days. Max: 2/day    Dispense:  14 tablet    Refill:  0    This prescription is part of a downward opioid taper. Fill instructions must be followed exactly as written to avoid withdrawal. Fill date: 03/14/17 To last until: 03/21/17  . oxyCODONE (OXY IR/ROXICODONE) 5 MG immediate release tablet    Sig: Take 1 tablet (5 mg total) by mouth 3 (three) times daily for 7 days. Max: 3/day    Dispense:  21 tablet    Refill:  0    This prescription is part of a downward opioid taper. Fill instructions must be followed exactly as written to avoid withdrawal. Fill date: 03/07/17 To last until: 03/14/17  . oxyCODONE (OXY IR/ROXICODONE) 5 MG immediate release tablet    Sig: Take 1 tablet (5 mg total) by mouth 4 (four) times daily for 7 days. Max: 4/day    Dispense:  28 tablet    Refill:  0    This prescription is part of a downward opioid taper. Fill instructions must be followed exactly as written to avoid withdrawal. Fill date: 02/28/17 To last until: 03/07/17  . oxyCODONE (OXY IR/ROXICODONE) 5 MG immediate release tablet    Sig: Take 1 tablet (5 mg total) by mouth 5 (five) times daily for 7 days. Max: 5/day    Dispense:  35 tablet    Refill:  0    This prescription is part of a downward opioid taper. Fill instructions must be followed exactly as written to avoid withdrawal. Fill date: 02/21/17 To last until: 02/28/17   Interventional management options: Planned, scheduled, and/or pending:   Continue tapering the oral opioids until we can completely stopped. He should be completely off by 03/28/2017. Intrathecal pump refill on 02/20/2017 at 11:15 AM.    Considering:   Intrathecal pump refill  Diagnostic bilateral lumbar facet block Possible bilateral  lumbar facet RFA Diagnostic caudal epidural steroid injection plus diagnostic epidurogram Possible Racz procedure Diagnostic left cervical epidural steroid injection Diagnosticleft cervical facet block Possible left cervical facet RFA   Palliative PRN treatment(s):   Diagnostic bilateral lumbar facet block under fluoroscopic guidance and IV sedation    Provider-requested follow-up: The patient will return on 02/20/2017 at 11:15 AM for intrathecal pump refill. At that time, he will still be taking oxycodone 10 mg every 8 hours for a total of 30 mg/day of oxycodone. He will be completely off of all oral oxycodone by 03/28/2017.  Future Appointments  Date Time Provider [place]  02/20/2017 11:15 AM Milinda Pointer, MD [facility] None  03/12/2017  9:00 AM AP-CT 1 AP-CT [place] [facility]  03/19/2017 10:30 AM Higgs, Mathis Dad, MD [facility] None   Primary Care Physician: [name], MD Location: [facility] Outpatient Pain Management Facility Note by: Gaspar Cola, MD Date: 01/15/2017; Time: 3:43 PM

## 2017-01-15 NOTE — Progress Notes (Deleted)
Wrong appointment

## 2017-01-19 NOTE — Progress Notes (Signed)
Patient's Name: Darrell White  MRN: [id number]  Referring Provider: [name], MD  DOB: 1950-09-10  PCP: [name], MD  DOS: 01/15/2017  Note by: Gaspar Cola, MD  Service setting: Ambulatory outpatient  Specialty: Interventional Pain Management  Location: [facility] (AMB) Pain Management Facility    Patient type: Established   Primary Reason(s) for Visit: Patient scheduled to come in for ITP refill. CC: Back Pain (lower); Neck Pain (shoulders bilaterally); and Knee Pain (right)  HPI  Darrell White is a 66 y.o. year old, male patient, who comes today for a follow-up evaluation. 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[place]); Presence of implanted infusion pump; Primary cancer of right upper lobe of lung ([place]); Carcinoma of base of tongue ([place]); Chronic low back pain (Primary Area of Pain) (Bilateral) (R>L); Chronic lower extremity pain (Secondary Area of Pain) (Bilateral) (R>L); Failed back surgical syndrome; Chronic neck pain (Tertiary Area of Pain) (Left); History of cervical spinal surgery; Chronic pain syndrome; Long term (current) use of opiate analgesic; Long term prescription opiate use; Opiate use; Cancer-related pain; Neurogenic pain; Neuropathic pain; Chronic musculoskeletal pain; Disorder of skeletal system; Pharmacologic therapy; Problems influencing health status; DDD (degenerative disc disease), cervical; DDD (degenerative disc disease), lumbar; Cervical spondylosis; Lumbar spondylosis; Adjustment and management of infusion pump; Lumbar facet syndrome (Bilateral); and Osteoarthritis of lumbar spine on their problem list. Darrell White was last seen on 01/01/2017. His primarily concern today is the Back Pain (lower); Neck Pain (shoulders bilaterally); and Knee Pain (right)  Pain Assessment: Location: Lower Back Radiating: hips/buttocks down back of legs to calves bilateral Onset: More than a month ago Duration: Chronic pain Quality: Constant, Discomfort, Aching, Throbbing Severity:  10-Worst pain ever/10 (self-reported pain score)  Note: Reported level is inconsistent with clinical observations. Clinically the patient looks like a 2/10 A 2/10 is viewed as "Mild to Moderate" and described as noticeable and distracting. Impossible to hide from other people. More frequent flare-ups. Still possible to adapt and function close to normal. It can be very annoying and may have occasional stronger flare-ups. With discipline, patients may get used to it and adapt. Darrell White does not seem to understand the use of our objective pain scale When using our objective Pain Scale, levels between 6 and 10/10 are said to belong in an emergency room, as it progressively worsens from a 6/10, described as severely limiting, requiring emergency care not usually available at an outpatient pain management facility. At a 6/10 level, communication becomes difficult and requires great effort. Assistance to reach the emergency department may be required. Facial flushing and profuse sweating along with potentially dangerous increases in heart rate and blood pressure will be evident. Effect on ADL: "I want to lay in bed all day" Timing: Constant Modifying factors: pain pump  The patient come in today for an ITP refill. Unfortunately, there was a problem with the scheduling where it was not properly coordinated with the ordering of the intrathecal medication. In addition, he was scheduled to come in too early. Because of our mistake and the inconvenience this has caused the patient, we will not be billing him for this visit. We will reschedule him for his pump refill and at that time, he will receive a complimentary "Gas Card" to reimburse him for his loss of time and fuel.  Constitutional Exam  General appearance: Well nourished, well developed, and well hydrated. In no apparent acute distress Vitals:   01/15/17 1422  BP: 109/61  Pulse: (!) 50  Resp: 16  Temp: 97.6 F (36.4 C)  SpO2: 100%  Weight: 155 lb  (70.3 kg)  Height: 5\' 8"  (1.727 m)   BMI Assessment: Estimated body mass index is 23.57 kg/m as calculated from the following:   Height as of this encounter: 5\' 8"  (1.727 m).   Weight as of this encounter: 155 lb (70.3 kg). Psych/Mental status: Alert, oriented x 3 (person, place, & time)       Eyes: PERLA Respiratory: No evidence of acute respiratory distress  Gait & Posture Assessment  Ambulation: Unassisted Gait: Relatively normal for age and body habitus Posture: WNL   Assessment   Status Diagnosis  Controlled Persistent Persistent 1. Chronic pain syndrome   2. Chronic low back pain (Primary Area of Pain) (Bilateral) (R>L)   3. Chronic lower extremity pain (Secondary Area of Pain) (Bilateral) (R>L)   4. Chronic neck pain (Tertiary Area of Pain) (Left)   5. Long term (current) use of opiate analgesic   6. Opiate use      Plan of Care   Provider-requested follow-up: Return for Pump Refill (as per pump program).  Future Appointments  Date Time Provider [place]  02/20/2017 11:15 AM Milinda Pointer, MD [facility] None  03/12/2017  9:00 AM AP-CT 1 AP-CT [place] [facility]  03/19/2017 10:30 AM Higgs, Mathis Dad, MD [facility] None   Primary Care Physician: [name], MD Location: [facility] Outpatient Pain Management Facility Note by: Gaspar Cola, MD Date: 01/15/2017; Time: 10:57 PM

## 2017-01-22 ENCOUNTER — Other Ambulatory Visit: Payer: Self-pay | Admitting: Pharmacist

## 2017-01-22 ENCOUNTER — Encounter: Payer: PPO | Admitting: Pain Medicine

## 2017-01-22 NOTE — Patient Outreach (Signed)
[facility]) Care Management  01/22/2017  Darrell White 01-17-1951 [id number]  Patient was referred to [place] Pharmacist by [facility] Kayleen Memos, for medication reconciliation.   Unsuccessful phone outreach to patient---call went directly to voicemail---HIPAA compliant message left requesting return call.   Plan:  Second outreach attempt within the next week.   Karrie Meres, PharmD, [place] [phone]

## 2017-02-05 ENCOUNTER — Ambulatory Visit: Payer: PPO | Admitting: Nurse Practitioner

## 2017-02-05 ENCOUNTER — Other Ambulatory Visit: Payer: Self-pay | Admitting: Pharmacist

## 2017-02-05 NOTE — Patient Outreach (Signed)
[facility] [facility]) Care Management  02/05/2017  Darrell White [birth year] [id number]  Unsuccessful phone outreach to patient, male answered phone and reports patient not available.  No PHI exchanged.  HIPAA compliant message left requesting return call.   Plan:  Third phone outreach attempt next week if no return call.   Karrie Meres, PharmD, [place] [phone]

## 2017-02-06 ENCOUNTER — Other Ambulatory Visit: Payer: Self-pay

## 2017-02-06 MED ORDER — PAIN MANAGEMENT IT PUMP REFILL: 1.0000 | Freq: Once | INTRATHECAL | 0 refills | Status: AC

## 2017-02-10 ENCOUNTER — Other Ambulatory Visit: Payer: Self-pay | Admitting: Pharmacist

## 2017-02-10 NOTE — Patient Outreach (Signed)
[facility]) Care Management  [place]   02/10/2017  Darrell White [birth year] [id number]  Subjective:  Incoming call received from patient---HIPAA details verified purpose of call explained to patient.   He denies Care Management needs but states he is willing to review his medications with [facility] Pharmacist.   He reports his PCP is Dr Charletta Cousin at [facility] in [place].     He has a past medical history significant for:  Squamous cell carcinoma of the tongue, chronic pain. He reports he fills his medications at [facility] and has them delivered.   Patient was unsure of dose of mirtazapine and trazodone.    He was referred to [place] Pharmacist by [facility] Kayleen Memos for medication review.      Objective:   Current Medications: Current Outpatient Medications  Medication Sig Dispense Refill  . atorvastatin (LIPITOR) 20 MG tablet Take 20 mg by mouth daily.     Marland Kitchen gabapentin (NEURONTIN) 300 MG capsule Take 600 mg by mouth daily.     Marland Kitchen levothyroxine (SYNTHROID, LEVOTHROID) 75 MCG tablet Take 75 mcg by mouth daily before breakfast.    . metoprolol succinate (TOPROL-XL) 25 MG 24 hr tablet Take 25 mg by mouth at bedtime.    . mirtazapine (REMERON) 45 MG tablet Take 45 mg by mouth at bedtime.    . traZODone (DESYREL) 50 MG tablet Take 200 mg by mouth at bedtime.     . vitamin B-12 (CYANOCOBALAMIN) 1000 MCG tablet Take 2,000 mcg by mouth every evening.    Marland Kitchen aspirin 81 MG chewable tablet Chew 81 mg by mouth daily.    Mariane Baumgarten Calcium (STOOL SOFTENER PO) Take 1 tablet by mouth daily as needed (constipation).    Marland Kitchen omeprazole (PRILOSEC) 20 MG capsule Take 20 mg by mouth daily as needed (heartburn).    Marland Kitchen oxyCODONE (OXY IR/ROXICODONE) 5 MG immediate release tablet Take 1 tablet (5 mg total) by mouth 5 (five) times daily for 7 days. Max: 5/day 35 tablet 0  . oxyCODONE (OXY IR/ROXICODONE) 5 MG immediate release tablet Take 1 tablet (5 mg total) by mouth 4 (four) times daily  for 7 days. Max: 4/day 28 tablet 0  . oxyCODONE (OXY IR/ROXICODONE) 5 MG immediate release tablet Take 1 tablet (5 mg total) by mouth 3 (three) times daily for 7 days. Max: 3/day 21 tablet 0  . oxyCODONE (OXY IR/ROXICODONE) 5 MG immediate release tablet Take 1 tablet (5 mg total) by mouth 2 (two) times daily for 7 days. Max: 2/day 14 tablet 0  . oxyCODONE (OXY IR/ROXICODONE) 5 MG immediate release tablet Take 1 tablet (5 mg total) by mouth daily for 7 days. Max: 1/day 7 tablet 0  . [START ON 03/21/2017] oxyCODONE (OXY IR/ROXICODONE) 5 MG immediate release tablet Take 1 tablet (5 mg total) by mouth daily for 7 days. Max: 1/day 7 tablet 0  . [START ON 03/14/2017] oxyCODONE (OXY IR/ROXICODONE) 5 MG immediate release tablet Take 1 tablet (5 mg total) by mouth 2 (two) times daily for 7 days. Max: 2/day 14 tablet 0  . [START ON 03/07/2017] oxyCODONE (OXY IR/ROXICODONE) 5 MG immediate release tablet Take 1 tablet (5 mg total) by mouth 3 (three) times daily for 7 days. Max: 3/day 21 tablet 0  . [START ON 02/28/2017] oxyCODONE (OXY IR/ROXICODONE) 5 MG immediate release tablet Take 1 tablet (5 mg total) by mouth 4 (four) times daily for 7 days. Max: 4/day 28 tablet 0  . [START ON 02/21/2017]  oxyCODONE (OXY IR/ROXICODONE) 5 MG immediate release tablet Take 1 tablet (5 mg total) by mouth 5 (five) times daily for 7 days. Max: 5/day 35 tablet 0  . Oxycodone HCl 10 MG TABS Take 1 tablet (10 mg total) by mouth every 8 (eight) hours. Maximum: 3/day. 126 tablet 0   No current facility-administered medications for this visit.     Functional Status: In your present state of health, do you have any difficulty performing the following activities: 05/20/2016 03/26/2016  Hearing? N N  Vision? N N  Difficulty concentrating or making decisions? N N  Walking or climbing stairs? N N  Dressing or bathing? N N  Some recent data might be hidden    Fall/Depression Screening: Fall Risk  01/15/2017 01/01/2017 10/23/2016  Falls in the  past year? No No No   PHQ 2/9 Scores 01/15/2017 01/06/2017 01/01/2017 01/01/2017 10/23/2016 10/16/2016 08/01/2016  PHQ - 2 Score 0 0 3 1 0 0 0  PHQ- 9 Score 10 - 13 - - - -    Assessment:  Medication review per patient report and review of medication list in this chart.  Call was also placed to [facility].    Drugs sorted by system:  Neurologic/Psychologic: -gabapentin---patient reports 600 mg once daily  -mirtazapine 45 mg at bedtime---last filled 11/20/16---90 days supply  -trazodone 50 mg 4 tabs at bedtime----last filled 12/27/16---90 days supply   Cardiovascular: -atorvastatin ---last filled 10/01/16---90 days supply -metoprolol succinate ---last filled 01/16/17---90 days supply  Gastrointestinal: -docusate as needed   Endocrine: -levothyroxine---01/16/17---90 days supply   Pain: -oxycodone per pain clinic  -he also has an intrathecal pain pump managed by pain clinic   Vitamins/Minerals: -cyanocobalamin   Drug interactions:  -increased risk of sedation/CNS depression with trazodone, opioids, gabapentin, mirtazapine   Other issues noted:  -potential gap in adherence with atorvastatin   -patient reports not using aspirin or omeprazole   Plan:  Note routed to PCP.  Will place call to PCP office to verify active medication list.   Patient denies questions/concerns related to his medications.    Karrie Meres, PharmD, [place] [phone]

## 2017-02-10 NOTE — Patient Outreach (Signed)
[place] [facility]) Care Management  02/10/2017  Darrell White 07-14-1950 [id number]  Third phone outreach attempt to patient---no answer, HIPAA compliant message.    Plan:  Will send outreach letter if no return call, as three unsuccessful phone outreach attempts have been made to patient.   Karrie Meres, PharmD, [place] [phone]

## 2017-02-12 ENCOUNTER — Other Ambulatory Visit: Payer: Self-pay | Admitting: Pharmacist

## 2017-02-12 NOTE — Patient Outreach (Signed)
[facility]) Care Management  02/12/2017  ROXY FILLER 1950/02/17 [id number]  Placed a call to PCP office, spoke with Loma Sousa, nurse health advisor, regarding patient's medications.   Courtney reports [facility] Pharmacist note was received by MD office and scanned into chart at MD office.   Loma Sousa confirms that patient is to be on:  Atorvastatin, gabapentin, omeprazole, aspirin, mirtazapine, levothyroxine, metoprolol succinate, trazodone.    She also reports aspirin and omeprazole were on medication---these were listed as medications patient reported not taking in note sent to PCP.   Plan:  Pharmacy episode will be closed as patient denied needs during phone call 02/10/17, and PCP office has received medication review note from [facility] Pharmacist.   Will update [place], Kayleen Memos.   Karrie Meres, PharmD, [place] [phone]

## 2017-02-20 ENCOUNTER — Other Ambulatory Visit: Payer: Self-pay

## 2017-02-20 ENCOUNTER — Ambulatory Visit: Payer: PPO | Attending: Pain Medicine | Admitting: Pain Medicine

## 2017-02-20 ENCOUNTER — Encounter: Payer: Self-pay | Admitting: Pain Medicine

## 2017-02-20 VITALS — BP 109/64 | HR 55 | Temp 97.8°F | Resp 18 | Ht 68.0 in | Wt 145.0 lb

## 2017-02-20 DIAGNOSIS — Z95828 Presence of other vascular implants and grafts: Secondary | ICD-10-CM

## 2017-02-20 DIAGNOSIS — M5441 Lumbago with sciatica, right side: Secondary | ICD-10-CM

## 2017-02-20 DIAGNOSIS — M961 Postlaminectomy syndrome, not elsewhere classified: Secondary | ICD-10-CM

## 2017-02-20 DIAGNOSIS — M545 Low back pain: Secondary | ICD-10-CM | POA: Diagnosis not present

## 2017-02-20 DIAGNOSIS — G8929 Other chronic pain: Secondary | ICD-10-CM

## 2017-02-20 DIAGNOSIS — G894 Chronic pain syndrome: Secondary | ICD-10-CM | POA: Diagnosis not present

## 2017-02-20 DIAGNOSIS — G893 Neoplasm related pain (acute) (chronic): Secondary | ICD-10-CM | POA: Diagnosis not present

## 2017-02-20 DIAGNOSIS — M79605 Pain in left leg: Secondary | ICD-10-CM | POA: Diagnosis not present

## 2017-02-20 DIAGNOSIS — Z451 Encounter for adjustment and management of infusion pump: Secondary | ICD-10-CM | POA: Diagnosis not present

## 2017-02-20 DIAGNOSIS — M79604 Pain in right leg: Secondary | ICD-10-CM | POA: Diagnosis not present

## 2017-02-20 DIAGNOSIS — Z79899 Other long term (current) drug therapy: Secondary | ICD-10-CM

## 2017-02-20 DIAGNOSIS — M5442 Lumbago with sciatica, left side: Secondary | ICD-10-CM

## 2017-02-20 NOTE — Patient Instructions (Signed)
Return prior to 7-22/2019 for pump refill per pump date.

## 2017-02-20 NOTE — Progress Notes (Signed)
Safety precautions to be maintained throughout the outpatient stay will include: orient to surroundings, keep bed in low position, maintain call bell within reach at all times, provide assistance with transfer out of bed and ambulation.  

## 2017-02-20 NOTE — Progress Notes (Addendum)
Patient's Name: Darrell White  MRN: [id number]  Referring Provider: [name], MD  DOB: Jul 30, 1950  PCP: [name], MD  DOS: 02/20/2017  Note by: Gaspar Cola, MD  Service setting: Ambulatory outpatient  Specialty: Interventional Pain Management  Patient type: Established  Location: [facility] (AMB) Pain Management Facility  Visit type: Interventional Procedure   Primary Reason for Visit: Interventional Pain Management Treatment. CC: Back Pain (low) and Leg Pain (bilateral)  Procedure:  Intrathecal Drug Delivery System (IDDS):  Type: Reservoir Refill [phone]) No rate change Region: Abdominal Laterality: Right  Type of Pump: Medtronic Synchromed II (MRI-compatible) Delivery Route: Intrathecal Type of Pain Treated: Neuropathic/Nociceptive Primary Medication Class: Opioid/opiate  Medication, Concentration, Infusion Program, & Delivery Rate: Please see scanned programming printout.   Indications: 1. Cancer-related pain   2. Chronic pain syndrome   3. Chronic low back pain (Primary Area of Pain) (Bilateral) (R>L)   4. Chronic lower extremity pain (Secondary Area of Pain) (Bilateral) (R>L)   5. Failed back surgical syndrome   6. Adjustment and management of infusion pump   7. Presence of implanted infusion pump   8. Pharmacologic therapy    Pain Assessment: Self-Reported Pain Score: 10-Worst pain ever/10             Reported level is compatible with observation.        Intrathecal Pump Therapy Assessment  Manufacturer: Medtronic Synchromed II Type: Programmable Volume: 40 mL reservoir MRI compatibility: Yes   Drug content:  Primary Medication Class: Opioid Primary Medication: PF-Morphine 20 mg/mL Secondary Medication: None Other Medication: None   Programming:  Type: Simple continuous. See pump readout for details.   Changes:  Medication Change: None at this point Rate Change: No change in rate  Reported side-effects or adverse reactions: None  reported  Effectiveness: Described as relatively effective, allowing for increase in activities of daily living (ADL) Clinically meaningful improvement in function (CMIF): Sustained CMIF goals met  Plan: Pump refill today  Pre-op Assessment:  Darrell White is a 67 y.o. (year old), male patient, seen today for interventional treatment. He  has a past surgical history that includes Mouth surgery; Cervical disc surgery; Shoulder arthroscopy; Back surgery; Spinal fusion; Spine surgery; Knee arthroscopy; Cataract extraction w/PHACO (Left, 04/09/2012); Direct laryngoscopy (N/A, 03/26/2016); Excision of tongue lesion (Right, 03/26/2016); and OTHER SURGICAL HISTORY. Darrell White has a current medication list which includes the following prescription(s): atorvastatin, docusate calcium, gabapentin, levothyroxine, metoprolol succinate, mirtazapine, omeprazole, oxycodone, oxycodone, oxycodone, oxycodone, oxycodone, oxycodone, oxycodone, oxycodone, oxycodone, oxycodone, aspirin, oxycodone hcl, trazodone, and vitamin b-12. His primarily concern today is the Back Pain (low) and Leg Pain (bilateral)  Initial Vital Signs: There were no vitals taken for this visit. BMI: Estimated body mass index is 22.05 kg/m as calculated from the following:   Height as of this encounter: '5\' 8"'$  (1.727 m).   Weight as of this encounter: 145 lb (65.8 kg).  Risk Assessment: Allergies: Reviewed. He has No Known Allergies.  Allergy Precautions: None required Coagulopathies: Reviewed. None identified.  Blood-thinner therapy: None at this time Active Infection(s): Reviewed. None identified. Darrell White is afebrile  Site Confirmation: Darrell White was asked to confirm the procedure and laterality before marking the site Procedure checklist: Completed Consent: Before the procedure and under the influence of no sedative(s), amnesic(s), or anxiolytics, the patient was informed of the treatment options, risks and possible complications. To fulfill our  ethical and legal obligations, as recommended by the American Medical Association's Code of Ethics, I have  informed the patient of my clinical impression; the nature and purpose of the treatment or procedure; the risks, benefits, and possible complications of the intervention; the alternatives, including doing nothing; the risk(s) and benefit(s) of the alternative treatment(s) or procedure(s); and the risk(s) and benefit(s) of doing nothing.  Darrell White was provided with information about the general risks and possible complications associated with most interventional procedures. These include, but are not limited to: failure to achieve desired goals, infection, bleeding, organ or nerve damage, allergic reactions, paralysis, and/or death.  In addition, he was informed of those risks and possible complications associated to this particular procedure, which include, but are not limited to: damage to the implant; failure to decrease pain; local, systemic, or serious CNS infections, intraspinal abscess with possible cord compression and paralysis, or life-threatening such as meningitis; bleeding; organ damage; nerve injury or damage with subsequent sensory, motor, and/or autonomic system dysfunction, resulting in transient or permanent pain, numbness, and/or weakness of one or several areas of the body; allergic reactions, either minor or major life-threatening, such as anaphylactic or anaphylactoid reactions.  Furthermore, Darrell White was informed of those risks and complications associated with the medications. These include, but are not limited to: allergic reactions (i.e.: anaphylactic or anaphylactoid reactions); endorphine suppression; bradycardia and/or hypotension; water retention and/or peripheral vascular relaxation leading to lower extremity edema and possible stasis ulcers; respiratory depression and/or shortness of breath; decreased metabolic rate leading to weight gain; swelling or edema;  medication-induced neural toxicity; particulate matter embolism and blood vessel occlusion with resultant organ, and/or nervous system infarction; and/or intrathecal granuloma formation with possible spinal cord compression and permanent paralysis.  Before refilling the pump Mr. Hilligoss was informed that some of the medications used in the devise may not be FDA approved for such use and therefore it constitutes an off-label use of the medications.  Finally, he was informed that Medicine is not an exact science; therefore, there is also the possibility of unforeseen or unpredictable risks and/or possible complications that may result in a catastrophic outcome. The patient indicated having understood very clearly. We have given the patient no guarantees and we have made no promises. Enough time was given to the patient to ask questions, all of which were answered to the patient's satisfaction. Mr. Coxe has indicated that he wanted to continue with the procedure. Attestation: I, the ordering provider, attest that I have discussed with the patient the benefits, risks, side-effects, alternatives, likelihood of achieving goals, and potential problems during recovery for the procedure that I have provided informed consent. Date: 02/20/2017; Time: 8:18 AM  Pre-Procedure Preparation:  Monitoring: As per clinic protocol. Respiration, ETCO2, SpO2, BP, heart rate and rhythm monitor placed and checked for adequate function Safety Precautions: Patient was assessed for positional comfort and pressure points before starting the procedure. Time-out: I initiated and conducted the "Time-out" before starting the procedure, as per protocol. The patient was asked to participate by confirming the accuracy of the "Time Out" information. Verification of the correct person, site, and procedure were performed and confirmed by me, the nursing staff, and the patient. "Time-out" conducted as per Joint Commission's Universal Protocol  (UP.01.01.01). "Time-out" Date & Time: 02/20/2017; 1224(Dr. Dossie Arbour and Terrilyn Saver RN) hrs.  Description of Procedure Process:   Position: Supine Target Area: Central-port of intrathecal pump. Approach: Anterior, 90 degree angle approach. Area Prepped: Entire Area around the pump implant. Prepping solution: ChloraPrep (2% chlorhexidine gluconate and 70% isopropyl alcohol) Safety Precautions: Aspiration looking for blood return was conducted  prior to all injections. At no point did we inject any substances, as a needle was being advanced. No attempts were made at seeking any paresthesias. Safe injection practices and needle disposal techniques used. Medications properly checked for expiration dates. SDV (single dose vial) medications used. Description of the Procedure: Protocol guidelines were followed. Two nurses trained to do implant refills were present during the entire procedure. The refill medication was checked by both healthcare providers as well as the patient. The patient was included in the "Time-out" to verify the medication. The patient was placed in position. The pump was identified. The area was prepped in the usual manner. The sterile template was positioned over the pump, making sure the side-port location matched that of the pump. Both, the pump and the template were held for stability. The needle provided in the Medtronic Kit was then introduced thru the center of the template and into the central port. The pump content was aspirated and discarded volume documented. The new medication was slowly infused into the pump, thru the filter, making sure to avoid overpressure of the device. The needle was then removed and the area cleansed, making sure to leave some of the prepping solution back to take advantage of its long term bactericidal properties. The pump was interrogated and programmed to reflect the correct medication, volume, and dosage. The program was printed and taken to the physician  for approval. Once checked and signed by the physician, a copy was provided to the patient and another scanned into the EMR. Vitals:   02/20/17 1215  BP: 109/64  Pulse: (!) 55  Resp: 18  Temp: 97.8 F (36.6 C)  TempSrc: Oral  SpO2: 99%  Weight: 145 lb (65.8 kg)  Height: '5\' 8"'$  (1.727 m)    Start Time: 1241 hrs. End Time: 1248 hrs. Materials & Medications: Medtronic Refill Kit Medication(s): Please see chart orders for details.  Imaging Guidance:  Type of Imaging Technique: None used Indication(s): N/A Exposure Time: No patient exposure Contrast: None used. Fluoroscopic Guidance: N/A Ultrasound Guidance: N/A Interpretation: N/A  Antibiotic Prophylaxis:  Indication(s): None identified Antibiotic given: None  Post-operative Assessment:  EBL: None Complications: No immediate post-treatment complications observed by team, or reported by patient. Note: The patient tolerated the entire procedure well. A repeat set of vitals were taken after the procedure and the patient was kept under observation following institutional policy, for this type of procedure. Post-procedural neurological assessment was performed, showing return to baseline, prior to discharge. The patient was provided with post-procedure discharge instructions, including a section on how to identify potential problems. Should any problems arise concerning this procedure, the patient was given instructions to immediately contact us, at any time, without hesitation. In any case, we plan to contact the patient by telephone for a follow-up status report regarding this interventional procedure. Comments:  No additional relevant information.  Controlled Substance Pharmacotherapy Assessment REMS (Risk Evaluation and Mitigation Strategy)  Analgesic: Oxycodone IR 10 mg 1 tablet by mouth every 8 hours (30 mg/day of oxycodone) (45 MME/day). As per the original treatment plan, we have been slowly tapering his opioids down with the  intent to discontinue them for a period of at least 2-3 weeks allowing him to get them out of the system thereby lowering his tolerance. We should be done with the tapering by 03/28/17. Original MME/day: 120 mg/day.  Current MME/day: 45 mg/day.  Hart Rochester, RN  02/20/2017 12:23 PM  Sign at close encounter Safety precautions to be maintained throughout  the outpatient stay will include: orient to surroundings, keep bed in low position, maintain call bell within reach at all times, provide assistance with transfer out of bed and ambulation.    Pharmacokinetics: Liberation and absorption (onset of action): WNL Distribution (time to peak effect): WNL Metabolism and excretion (duration of action): WNL         Pharmacodynamics: Desired effects: Analgesia: Mr. Erber reports >50% benefit. Functional ability: Patient reports that medication allows him to accomplish basic ADLs Clinically meaningful improvement in function (CMIF): Sustained CMIF goals met Perceived effectiveness: Described as relatively effective, allowing for increase in activities of daily living (ADL) Undesirable effects: Side-effects or Adverse reactions: None reported Monitoring: [place] PMP: Online review of the past 90-monthperiod conducted. Compliant with practice rules and regulations Last UDS on record: Summary  Date Value Ref Range Status  10/16/2016 FINAL  Final    Comment:    ==================================================================== [facility] COMP DRUG ANALYSIS,UR ==================================================================== Test                             Result       Flag       Units Drug Present and Declared for Prescription Verification   Oxycodone                      >3534        EXPECTED   ng/mg creat   Oxymorphone                    3447         EXPECTED   ng/mg creat   Noroxycodone                   >3534        EXPECTED   ng/mg creat   Noroxymorphone                 701           EXPECTED   ng/mg creat    Sources of oxycodone are scheduled prescription medications.    Oxymorphone, noroxycodone, and noroxymorphone are expected    metabolites of oxycodone. Oxymorphone is also available as a    scheduled prescription medication.   Gabapentin                     PRESENT      EXPECTED   Mirtazapine                    PRESENT      EXPECTED   Trazodone                      PRESENT      EXPECTED   1,3 chlorophenyl piperazine    PRESENT      EXPECTED    1,3-chlorophenyl piperazine is an expected metabolite of    trazodone.   Metoprolol                     PRESENT      EXPECTED Drug Present not Declared for Prescription Verification   Morphine                       1194         UNEXPECTED ng/mg creat   Normorphine  26           UNEXPECTED ng/mg creat    Potential sources of large amounts of morphine in the absence of    codeine include administration of morphine or use of heroin.    Normorphine is an expected metabolite of morphine. Drug Absent but Declared for Prescription Verification   Salicylate                     Not Detected UNEXPECTED    Aspirin, as indicated in the declared medication list, is not    always detected even when used as directed. ==================================================================== Test                      Result    Flag   Units      Ref Range   Creatinine              283              mg/dL      >=20 ==================================================================== Declared Medications:  The flagging and interpretation on this report are based on the  following declared medications.  Unexpected results may arise from  inaccuracies in the declared medications.  **Note: The testing scope of this panel includes these medications:  Gabapentin  Metoprolol  Mirtazapine  Oxycodone  Trazodone  **Note: The testing scope of this panel does not include small to  moderate amounts of these reported medications:   Aspirin (Aspirin 81)  **Note: The testing scope of this panel does not include following  reported medications:  Atorvastatin  Cyanocobalamin  Docusate  Levothyroxine  Omeprazole ==================================================================== For clinical consultation, please call [phone]. ====================================================================    UDS interpretation: Compliant          Medication Assessment Form: Reviewed. Patient indicates being compliant with therapy Treatment compliance: Compliant Risk Assessment Profile: Aberrant behavior: See prior evaluations. None observed or detected today Comorbid factors increasing risk of overdose: See prior notes. No additional risks detected today Risk of substance use disorder (SUD): Low Opioid Risk Tool - 01/15/17 1429      Family History of Substance Abuse   Alcohol  Negative    Illegal Drugs  Negative    Rx Drugs  Negative      Personal History of Substance Abuse   Alcohol  Negative    Illegal Drugs  Negative    Rx Drugs  Negative      History of Preadolescent Sexual Abuse   History of Preadolescent Sexual Abuse  Negative or Male      Psychological Disease   Psychological Disease  Negative    Depression  Negative      Total Score   Opioid Risk Tool Scoring  0    Opioid Risk Interpretation  Low Risk      ORT Scoring interpretation table:  Score <3 = Low Risk for SUD  Score between 4-7 = Moderate Risk for SUD  Score >8 = High Risk for Opioid Abuse   Risk Mitigation Strategies:  Patient Counseling: Covered Patient-Prescriber Agreement (PPA): Present and active  Notification to other healthcare providers: Done  Pharmacologic Plan: No change in therapy, at this time.             Assessment  Primary Diagnosis & Pertinent Problem List: The primary encounter diagnosis was Cancer-related pain. Diagnoses of Chronic pain syndrome, Chronic low back pain (Primary Area of Pain) (Bilateral) (R>L),  Chronic lower extremity pain (Secondary Area of Pain) (  Bilateral) (R>L), Failed back surgical syndrome, Adjustment and management of infusion pump, Presence of implanted infusion pump, and Pharmacologic therapy were also pertinent to this visit.  Status Diagnosis  Controlled Controlled Controlled 1. Cancer-related pain   2. Chronic pain syndrome   3. Chronic low back pain (Primary Area of Pain) (Bilateral) (R>L)   4. Chronic lower extremity pain (Secondary Area of Pain) (Bilateral) (R>L)   5. Failed back surgical syndrome   6. Adjustment and management of infusion pump   7. Presence of implanted infusion pump   8. Pharmacologic therapy     Problems updated and reviewed during this visit: No problems updated. Plan of Care   Imaging Orders  No imaging studies ordered today    Procedure Orders     PUMP REFILL     PUMP REFILL  Medications ordered for procedure: No orders of the defined types were placed in this encounter.  Medications administered: Sabas Sous had no medications administered during this visit.  See the medical record for exact dosing, route, and time of administration.  New Prescriptions   No medications on file   Disposition: Discharge home  Discharge Date & Time: 02/20/2017; 1320 hrs.   Physician-requested Follow-up: Return for Pump Refill (as per pump program).  Future Appointments  Date Time Provider [place]  03/12/2017  9:00 AM AP-CT 1 [facility]-CT [facility]  03/19/2017 10:30 AM Higgs, Mathis Dad, MD [facility] None  03/26/2017  9:30 AM Milinda Pointer, MD [facility] None  07/31/2017 11:00 AM Milinda Pointer, MD [facility] None   Primary Care Physician: [name], MD Location: [facility] Note by: Gaspar Cola, MD Date: 02/20/2017; Time: 1:28 PM  Disclaimer:  Medicine is not an Chief Strategy Officer. The only guarantee in medicine is that nothing is guaranteed. It is important to note that the decision to  proceed with this intervention was based on the information collected from the patient. The Data and conclusions were drawn from the patient's questionnaire, the interview, and the physical examination. Because the information was provided in large part by the patient, it cannot be guaranteed that it has not been purposely or unconsciously manipulated. Every effort has been made to obtain as much relevant data as possible for this evaluation. It is important to note that the conclusions that lead to this procedure are derived in large part from the available data. Always take into account that the treatment will also be dependent on availability of resources and existing treatment guidelines, considered by other Pain Management Practitioners as being common knowledge and practice, at the time of the intervention. For Medico-Legal purposes, it is also important to point out that variation in procedural techniques and pharmacological choices are the acceptable norm. The indications, contraindications, technique, and results of the above procedure should only be interpreted and judged by a Board-Certified Interventional Pain Specialist with extensive familiarity and expertise in the same exact procedure and technique.

## 2017-02-24 MED FILL — Medication: INTRATHECAL | Qty: 1 | Status: AC

## 2017-03-04 DIAGNOSIS — M545 Low back pain: Secondary | ICD-10-CM | POA: Diagnosis not present

## 2017-03-04 DIAGNOSIS — Z6823 Body mass index (BMI) 23.0-23.9, adult: Secondary | ICD-10-CM | POA: Diagnosis not present

## 2017-03-06 ENCOUNTER — Telehealth: Payer: Self-pay | Admitting: *Deleted

## 2017-03-12 ENCOUNTER — Ambulatory Visit (HOSPITAL_COMMUNITY): Payer: PPO

## 2017-03-14 ENCOUNTER — Ambulatory Visit (HOSPITAL_COMMUNITY)
Admission: RE | Admit: 2017-03-14 | Discharge: 2017-03-14 | Disposition: A | Discharge status: Home / Self Care | Payer: PPO | Source: Ambulatory Visit | Attending: Oncology | Admitting: Oncology

## 2017-03-14 DIAGNOSIS — R911 Solitary pulmonary nodule: Secondary | ICD-10-CM | POA: Insufficient documentation

## 2017-03-14 DIAGNOSIS — I700 Atherosclerosis of aorta: Secondary | ICD-10-CM | POA: Diagnosis not present

## 2017-03-14 DIAGNOSIS — Q991 46, XX true hermaphrodite: Secondary | ICD-10-CM

## 2017-03-14 DIAGNOSIS — Y842 Radiological procedure and radiotherapy as the cause of abnormal reaction of the patient, or of later complication, without mention of misadventure at the time of the procedure: Secondary | ICD-10-CM | POA: Diagnosis not present

## 2017-03-14 DIAGNOSIS — R590 Localized enlarged lymph nodes: Secondary | ICD-10-CM | POA: Diagnosis not present

## 2017-03-14 LAB — POCT I-STAT CREATININE: CREATININE: 0.9 mg/dL (ref 0.61–1.24)

## 2017-03-14 MED ORDER — IOPAMIDOL (ISOVUE-300) INJECTION 61%
75.0000 mL | Freq: Once | INTRAVENOUS | Status: AC | PRN
  Administered 2017-03-14: 75 mL via INTRAVENOUS

## 2017-03-19 ENCOUNTER — Inpatient Hospital Stay (HOSPITAL_COMMUNITY): Payer: PPO | Attending: Internal Medicine | Admitting: Internal Medicine

## 2017-03-19 DIAGNOSIS — E039 Hypothyroidism, unspecified: Secondary | ICD-10-CM | POA: Diagnosis not present

## 2017-03-19 DIAGNOSIS — R918 Other nonspecific abnormal finding of lung field: Secondary | ICD-10-CM

## 2017-03-19 DIAGNOSIS — E7800 Pure hypercholesterolemia, unspecified: Secondary | ICD-10-CM

## 2017-03-19 DIAGNOSIS — M5136 Other intervertebral disc degeneration, lumbar region: Secondary | ICD-10-CM | POA: Diagnosis not present

## 2017-03-19 DIAGNOSIS — M7918 Myalgia, other site: Secondary | ICD-10-CM | POA: Diagnosis not present

## 2017-03-19 DIAGNOSIS — Z923 Personal history of irradiation: Secondary | ICD-10-CM

## 2017-03-19 DIAGNOSIS — F419 Anxiety disorder, unspecified: Secondary | ICD-10-CM | POA: Diagnosis not present

## 2017-03-19 DIAGNOSIS — C029 Malignant neoplasm of tongue, unspecified: Secondary | ICD-10-CM | POA: Insufficient documentation

## 2017-03-19 DIAGNOSIS — K219 Gastro-esophageal reflux disease without esophagitis: Secondary | ICD-10-CM | POA: Diagnosis not present

## 2017-03-19 DIAGNOSIS — I6522 Occlusion and stenosis of left carotid artery: Secondary | ICD-10-CM | POA: Diagnosis not present

## 2017-03-19 DIAGNOSIS — Z79899 Other long term (current) drug therapy: Secondary | ICD-10-CM

## 2017-03-19 DIAGNOSIS — G894 Chronic pain syndrome: Secondary | ICD-10-CM | POA: Diagnosis not present

## 2017-03-19 DIAGNOSIS — F329 Major depressive disorder, single episode, unspecified: Secondary | ICD-10-CM | POA: Insufficient documentation

## 2017-03-19 DIAGNOSIS — C3411 Malignant neoplasm of upper lobe, right bronchus or lung: Secondary | ICD-10-CM | POA: Diagnosis not present

## 2017-03-19 DIAGNOSIS — I700 Atherosclerosis of aorta: Secondary | ICD-10-CM | POA: Diagnosis not present

## 2017-03-19 DIAGNOSIS — Z7982 Long term (current) use of aspirin: Secondary | ICD-10-CM | POA: Insufficient documentation

## 2017-03-19 DIAGNOSIS — C3490 Malignant neoplasm of unspecified part of unspecified bronchus or lung: Secondary | ICD-10-CM

## 2017-03-19 DIAGNOSIS — G629 Polyneuropathy, unspecified: Secondary | ICD-10-CM

## 2017-03-19 DIAGNOSIS — G893 Neoplasm related pain (acute) (chronic): Secondary | ICD-10-CM | POA: Diagnosis not present

## 2017-03-19 DIAGNOSIS — F17210 Nicotine dependence, cigarettes, uncomplicated: Secondary | ICD-10-CM

## 2017-03-19 NOTE — Patient Instructions (Addendum)
[place] [facility] Discharge Instructions  RECOMMENDATIONS MADE BY THE CONSULTANT AND ANY TEST RESULTS WILL BE SENT TO YOUR REFERRING PHYSICIAN.   Today you saw Dr. Walden Field  CT scan of the chest reviewed with you and a copy of the scan given to you. 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 You do have a nodule that we need to monitor. The plan is to set you up for a follow up PET scan in 3 months to see if it is changing.   Please call us when you are about 2 weeks out from your PET scan so that we can see if there is something we can do to increase your comfort while having the MRI of the brain and PET scan.    MRI of the brain to be performed around the same time as the PET scan in 3 months. This is so that we can make sure nothing is going on in the brain.   If you decide you want to stop smoking please let us know. We will set you up for counseling.      Thank you for choosing [place] [facility] to provide your oncology and hematology care.  To afford each patient quality time with our provider, please arrive at least 15 minutes before your scheduled appointment time.    If you have a lab appointment with the [facility] please come in thru the  Main Entrance and check in at the main information desk  You need to re-schedule your appointment should you arrive 10 or more minutes late.  We strive to give you quality time with our providers, and arriving late affects you and other patients whose appointments are after yours.  Also, if you no show three or more times for appointments you may be dismissed from the clinic at the providers discretion.     Again, thank you for choosing [facility].  Our hope is that these requests will decrease the amount of time that you wait before being seen by our physicians.       _____________________________________________________________  Should you have  questions after your visit to [facility], please contact our office at [phone] between the hours of 8:30 a.m. and 4:30 p.m.  Voicemails left after 4:30 p.m. will not be returned until the following business day.  For prescription refill requests, have your pharmacy contact our office.       Resources For Cancer Patients and their Caregivers ? American Cancer Society: Can assist with transportation, wigs, general needs, runs Look Good Feel Better.        ([phone] ? Cancer Care: Provides financial assistance, online support groups, medication/co-pay assistance.  1-800-813-HOPE 864-046-2994) ? [facility] Assists [place] cancer patients and their families through emotional , educational and financial support.  407 126 7074 ? [place] DSS Where to apply for food stamps, Medicaid and utility assistance. 6716513384 ? [facility]: Transportation to medical appointments. 626-837-4478 ? Social Security Administration: May apply for disability if have a Stage IV cancer. 469-265-3898 684 830 9818 ? [employer], Disability and Transit Services: Assists with nutrition, care and transit needs. [place] Support Programs: @10RELATIVEDAYS @ > Cancer Support Group  2nd Tuesday of the month 1pm-2pm, Journey Room  > Creative Journey  3rd Tuesday of the month 1130am-1pm, Journey Room  > Look Good Feel Better  1st Wednesday of the month 10am-12  noon, Journey Room (Call [place] to register [phone])

## 2017-03-24 ENCOUNTER — Other Ambulatory Visit (HOSPITAL_COMMUNITY): Payer: PPO

## 2017-03-25 DIAGNOSIS — F17200 Nicotine dependence, unspecified, uncomplicated: Secondary | ICD-10-CM | POA: Diagnosis not present

## 2017-03-25 DIAGNOSIS — J209 Acute bronchitis, unspecified: Secondary | ICD-10-CM | POA: Diagnosis not present

## 2017-03-25 DIAGNOSIS — Z6823 Body mass index (BMI) 23.0-23.9, adult: Secondary | ICD-10-CM | POA: Diagnosis not present

## 2017-03-25 DIAGNOSIS — J40 Bronchitis, not specified as acute or chronic: Secondary | ICD-10-CM

## 2017-03-25 HISTORY — DX: Bronchitis, not specified as acute or chronic: J40

## 2017-03-26 ENCOUNTER — Encounter: Payer: PPO | Admitting: Pain Medicine

## 2017-03-26 NOTE — Progress Notes (Signed)
Diagnosis Malignant neoplasm of unspecified part of unspecified bronchus or lung ([place]) - Plan: CBC with Differential/Platelet, Comprehensive metabolic panel, Lactate dehydrogenase, MR Brain W Contrast, NM PET Image Restag (PS) Skull Base To Thigh, MR Brain W Wo Contrast  Staging Cancer Staging Primary cancer of right upper lobe of lung ([place]) Staging form: Lung, AJCC 7th Edition - Clinical stage from 06/05/2016: Stage IA (T1a, N0, M0) - Signed by [name], MD on 06/05/2016   Assessment and Plan:  1.  Squamous cell carcinoma of tongue [facility]): Patient was followed by Dr. Talbert Cage he.  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 Benjamine Mola on 03/26/2016.  At his last visit with Dr. Talbert Cage she discussed with him that he had a spiculated 2 cm mass in the RUL and also some subcentimeter nodules in the RUL. Biopsy of RUL mass demonstrated squamous cell carcinoma.   Given lack of hypermetabolic neck nodal disease on PET, She suspected that this RUL mass is likely a second primary rather than metastatic disease from his tongue SCC. He was felt to have lesions as 2 early stage SCCs and case was discussed with Rad/Onc Dr. Quitman Livings, for consideration for SBRT or radiosurgery of lung lesion.  He was treated with SBRT  He is here to go over PET scan that was done on 03/14/2017 that showed IMPRESSION: 1. No significant change in the RIGHT upper lobe nodule with a surrounding radiation change. 2. Interval increase in size of small (4 mm) RIGHT middle lobe nodule. Recommend close attention follow-up. 3. Stable small LEFT lower lobe nodule. 4. Stable mild mediastinal and RIGHT adenopathy. Aortic Atherosclerosis (ICD10-I70.0).  He will be set up for PET scan and MRI of brain in 3 months for interval followup. He has had previous discussion regarding smoking cessation. He will RTC in 3 months for followup to go over scans.    2.  Lung cancer.  He has undergone  SBRT of the right lung lesion.  Recent CT shows stable findings.  He will have close followup and will have repeat imaging with PET scan and MRI of the brain in 3 months.    3.  Smoking.  Cessation has been recommended.    4.  Hypertension.  Continue to follow-up with PCP.    Current Status:  Pt is seen today for follow-up.  He is here to go over CT scan.      Squamous cell carcinoma of tongue ([place])   03/14/2016 Miscellaneous    Dr. Benjamine Mola (ENT) consult      03/14/2016 Procedure    Flex laryngoscopy-base of tongue with ulcerative mass in the right side.      03/26/2016 Procedure    Biopsy of right tongue by  Dr. Benjamine Mola      03/27/2016 Pathology Results    Invasive squamous cell carcinoma.      04/02/2016 Miscellaneous    Pathology contacted and requested HPV testing on malignancy specimen.      04/09/2016 Imaging    CT soft tissue neck: IMPRESSION: Enhancing mass base of tongue on the right measures 25 x 27 mm compatible with carcinoma. No pathologic adenopathy in the neck.  Extensive carotid atherosclerotic disease. There is significant carotid stenosis on the left. Carotid duplex or CTA recommended for further evaluation.      04/09/2016 Imaging    CT chest w contrast: IMPRESSION: 1. Irregular spiculated 2.0 cm apical right upper lobe pulmonary nodule, new since 07/31/2006  PET-CT, morphologically suspicious for malignancy, which could represent a metastasis versus a synchronous primary bronchogenic carcinoma. 2. Separate tiny 3 mm solid pulmonary nodule in the right upper lobe, also new since 2008, indeterminate metastasis. 3. Mild right hilar lymphadenopathy, cannot exclude nodal metastasis. 4. Consider PET-CT for further staging evaluation. 5. Mild radiation fibrosis in the medial apical upper lobes bilaterally. 6. Additional findings include aortic atherosclerosis, 2 vessel coronary atherosclerosis and mild centrilobular emphysema.      05/03/2016 PET scan     Hypermetabolic mass (R) base of tongue and lingular tonsil c/w H&N cancer recurrence. No evidence of metastatic adenopathy in neck. Hypermetabolic nodule in RUL lung measuring 15 mm concerning for pulmonary mets vs primary lung carcinoma (favor primary lung cancer). Additional small sub-5 mm pulmonary nodules are favored to be benign.       04/2016 Pathology Results    Focal staining for p16 positive in approx 5-10% of squamous cells.       05/20/2016 Pathology Results    RUL lung mass biopsy: surgical path squamous cell carcinoma      Problem List Patient Active Problem List   Diagnosis Date Noted  . Adjustment and management of infusion pump [Z45.1] 12/31/2016  . Lumbar facet syndrome (Bilateral) [M47.816] 12/31/2016  . 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 Cancer-related pain [G89.3] 10/25/2016  . Neurogenic pain [M79.2] 10/25/2016  . Neuropathic pain [M79.2] 10/25/2016  . Chronic musculoskeletal pain [M79.18, G89.29] 10/25/2016  . Disorder of skeletal system [M89.9] 10/25/2016  . Pharmacologic therapy [Z79.899] 10/25/2016  . Problems influencing health status [Z78.9] 10/25/2016  . DDD (degenerative disc disease), cervical [M50.30] 10/25/2016  . DDD (degenerative disc disease), lumbar [M51.36] 10/25/2016  . Cervical spondylosis [M47.812] 10/25/2016  . Lumbar spondylosis [M47.26] 10/25/2016  . Chronic low back pain (Primary Area of Pain) (Bilateral) (R>L) [M54.42, M54.41, G89.29] 10/16/2016  . Chronic lower extremity pain (Secondary Area of Pain) (Bilateral) (R>L) [M79.604, M79.605, G89.29] 10/16/2016  . Failed back surgical syndrome [M96.1] 10/16/2016  . Chronic neck pain [facility] Area of Pain) (Left) [M54.2, G89.29] 10/16/2016  . History of cervical spinal surgery [Z98.890] 10/16/2016  . Chronic pain syndrome [G89.4] 10/16/2016  . Long term (current) use of opiate analgesic [Z79.891] 10/16/2016  . Long term prescription opiate use [Z79.891] 10/16/2016  .  Opiate use [F11.90] 10/16/2016  . Carcinoma of base of tongue ([place]) [C01] 06/18/2016  . Primary cancer of right upper lobe of lung ([place]) [C34.11] 06/04/2016  . Squamous cell carcinoma of tongue ([place]) [C02.9] 04/02/2016  . Presence of implanted infusion pump [Z95.828] 09/29/2012    Past Medical History Past Medical History:  Diagnosis Date  . Anxiety   . Arthritis   . Cancer ([place])    right base of tongue  . Depression   . GERD (gastroesophageal reflux disease)   . HOH (hard of hearing)   . Hypercholesteremia   . Hypertension   . Hypothyroid   . Lung cancer ([place])    right upper lobe non small cell lung cancer  . Neuropathy   . Squamous cell carcinoma of tongue ([place]) 04/02/2016    Past Surgical History Past Surgical History:  Procedure Laterality Date  . BACK SURGERY     had total of 7 surgeries with rods and plates  . CATARACT EXTRACTION W/PHACO Left 04/09/2012   Procedure: CATARACT EXTRACTION PHACO AND INTRAOCULAR LENS PLACEMENT (IOC);  Surgeon: Tonny Branch, MD;  Location: [facility];  Service: Ophthalmology;  Laterality: Left;  CDE: 12.44  . CERVICAL  [place] SURGERY    . DIRECT LARYNGOSCOPY N/A 03/26/2016   Procedure: DIRECT LARYNGOSCOPY;  Surgeon: Leta Baptist, MD;  Location: [place];  Service: ENT;  Laterality: N/A;  . EXCISION OF TONGUE LESION Right 03/26/2016   Procedure: BIOPSY OF RIGHT TONGUE BASE MASS;  Surgeon: Leta Baptist, MD;  Location: [place];  Service: ENT;  Laterality: Right;  . KNEE ARTHROSCOPY     right knee  . MOUTH SURGERY     removal of cancer  . OTHER SURGICAL HISTORY     insertion of pain pump  . SHOULDER ARTHROSCOPY     bilateral  . SPINAL FUSION    . SPINE SURGERY     insertion of morphine pump into spine    Family History Family History  Problem Relation Age of Onset  . Cancer Father   . Cancer Brother        Prostate cancer     Social History  reports that he has been smoking cigarettes.  He has a 55.00 pack-year  smoking history. he has never used smokeless tobacco. He reports that he does not drink alcohol or use drugs.  Medications  Current Outpatient Medications:  .  aspirin 81 MG chewable tablet, Chew 81 mg by mouth daily., Disp: , Rfl:  .  atorvastatin (LIPITOR) 20 MG tablet, Take 20 mg by mouth daily. , Disp: , Rfl:  .  Docusate Calcium (STOOL SOFTENER PO), Take 1 tablet by mouth daily as needed (constipation)., Disp: , Rfl:  .  gabapentin (NEURONTIN) 300 MG capsule, Take 600 mg by mouth daily. , Disp: , Rfl:  .  levothyroxine (SYNTHROID, LEVOTHROID) 75 MCG tablet, Take 75 mcg by mouth daily before breakfast., Disp: , Rfl:  .  metoprolol succinate (TOPROL-XL) 25 MG 24 hr tablet, Take 25 mg by mouth at bedtime., Disp: , Rfl:  .  mirtazapine (REMERON) 45 MG tablet, Take 45 mg by mouth at bedtime., Disp: , Rfl:  .  omeprazole (PRILOSEC) 20 MG capsule, Take 20 mg by mouth daily as needed (heartburn)., Disp: , Rfl:  .  oxyCODONE (OXY IR/ROXICODONE) 5 MG immediate release tablet, Take 1 tablet (5 mg total) by mouth 5 (five) times daily for 7 days. Max: 5/day, Disp: 35 tablet, Rfl: 0 .  oxyCODONE (OXY IR/ROXICODONE) 5 MG immediate release tablet, Take 1 tablet (5 mg total) by mouth 4 (four) times daily for 7 days. Max: 4/day, Disp: 28 tablet, Rfl: 0 .  oxyCODONE (OXY IR/ROXICODONE) 5 MG immediate release tablet, Take 1 tablet (5 mg total) by mouth 3 (three) times daily for 7 days. Max: 3/day, Disp: 21 tablet, Rfl: 0 .  oxyCODONE (OXY IR/ROXICODONE) 5 MG immediate release tablet, Take 1 tablet (5 mg total) by mouth 2 (two) times daily for 7 days. Max: 2/day, Disp: 14 tablet, Rfl: 0 .  oxyCODONE (OXY IR/ROXICODONE) 5 MG immediate release tablet, Take 1 tablet (5 mg total) by mouth daily for 7 days. Max: 1/day, Disp: 7 tablet, Rfl: 0 .  oxyCODONE (OXY IR/ROXICODONE) 5 MG immediate release tablet, Take 1 tablet (5 mg total) by mouth daily for 7 days. Max: 1/day, Disp: 7 tablet, Rfl: 0 .  oxyCODONE (OXY  IR/ROXICODONE) 5 MG immediate release tablet, Take 1 tablet (5 mg total) by mouth 2 (two) times daily for 7 days. Max: 2/day, Disp: 14 tablet, Rfl: 0 .  oxyCODONE (OXY IR/ROXICODONE) 5 MG immediate release tablet, Take 1 tablet (5 mg total) by mouth 3 (three) times daily  for 7 days. Max: 3/day, Disp: 21 tablet, Rfl: 0 .  oxyCODONE (OXY IR/ROXICODONE) 5 MG immediate release tablet, Take 1 tablet (5 mg total) by mouth 4 (four) times daily for 7 days. Max: 4/day, Disp: 28 tablet, Rfl: 0 .  oxyCODONE (OXY IR/ROXICODONE) 5 MG immediate release tablet, Take 1 tablet (5 mg total) by mouth 5 (five) times daily for 7 days. Max: 5/day, Disp: 35 tablet, Rfl: 0 .  Oxycodone HCl 10 MG TABS, Take 1 tablet (10 mg total) by mouth every 8 (eight) hours. Maximum: 3/day., Disp: 126 tablet, Rfl: 0 .  traZODone (DESYREL) 50 MG tablet, Take 200 mg by mouth at bedtime. , Disp: , Rfl:  .  vitamin B-12 (CYANOCOBALAMIN) 1000 MCG tablet, Take 2,000 mcg by mouth every evening., Disp: , Rfl:   Allergies Patient has no known allergies.  Review of Systems Review of Systems - Oncology ROS as per HPI otherwise 12 point ROS is negative.   Physical Exam:  See nursing intake for vitals.    Vitals Wt Readings from Last 3 Encounters:  02/20/17 145 lb (65.8 kg)  01/15/17 155 lb (70.3 kg)  01/01/17 155 lb (70.3 kg)   Temp Readings from Last 3 Encounters:  02/20/17 97.8 F (36.6 C) (Oral)  01/15/17 97.6 F (36.4 C)  01/01/17 97.6 F (36.4 C)   BP Readings from Last 3 Encounters:  02/20/17 109/64  01/15/17 109/61  01/01/17 (!) 120/59   Pulse Readings from Last 3 Encounters:  02/20/17 (!) 55  01/15/17 (!) 50  01/01/17 70   Constitutional: Well-developed, well-nourished, and in no distress.   HENT: Head: Normocephalic and atraumatic.  Mouth/Throat: No oropharyngeal exudate. Mucosa moist. Eyes: Pupils are equal, round, and reactive to light. Conjunctivae are normal. No scleral icterus.  Neck: Normal range of  motion. Neck supple. No JVD present.  Cardiovascular: Normal rate, regular rhythm and normal heart sounds.  Exam reveals no gallop and no friction rub.   No murmur heard. Pulmonary/Chest: Coarse BS Abdominal: Soft. Bowel sounds are normal. No distension. There is no tenderness. There is no guarding.  Musculoskeletal: No edema or tenderness.  Lymphadenopathy: No cervical. axillary or supraclavicular adenopathy.  Neurological: Alert and oriented to person, place, and time. No cranial nerve deficit.  Skin: Skin is warm and dry. No rash noted. No erythema. No pallor.  Psychiatric: Affect and judgment normal.   Labs No visits with results within 3 Day(s) from this visit.  Latest known visit with results is:  Hospital Outpatient Visit on 03/14/2017  Component Date Value Ref Range Status  . Creatinine, Ser 03/14/2017 0.90  0.61 - 1.24 mg/dL Final     Pet scan done 03/14/2017:  IMPRESSION: 1. No significant change in the RIGHT upper lobe nodule with a surrounding radiation change. 2. Interval increase in size of small (4 mm) RIGHT middle lobe nodule. Recommend close attention follow-up. 3. Stable small LEFT lower lobe nodule. 4. Stable mild mediastinal and RIGHT adenopathy.  Aortic Atherosclerosis (ICD10-I70.0). Orders Placed This Encounter  Procedures  . MR Brain W Contrast    Standing Status:   Future    Standing Expiration Date:   06/16/2017    Order Specific Question:   If indicated for the ordered procedure, I authorize the administration of contrast media per Radiology protocol    Answer:   Yes    Order Specific Question:   What is the patient's sedation requirement?    Answer:   Anti-anxiety    Order Specific Question:  Does the patient have a pacemaker or implanted devices?    Answer:   No    Order Specific Question:   Radiology Contrast Protocol - do NOT remove file path    Answer:   \\[email or web address]    Order Specific Question:   Preferred imaging  location?    Answer:   [facility] (table limit-350lbs)  . NM PET Image Restag (PS) Skull Base To Thigh    Standing Status:   Future    Standing Expiration Date:   03/19/2018    Order Specific Question:   If indicated for the ordered procedure, I authorize the administration of a radiopharmaceutical per Radiology protocol    Answer:   Yes    Order Specific Question:   Preferred imaging location?    Answer:   [facility]    Order Specific Question:   Radiology Contrast Protocol - do NOT remove file path    Answer:   [email or web address]  . MR Brain W Wo Contrast    Standing Status:   Future    Standing Expiration Date:   06/16/2017    Order Specific Question:   If indicated for the ordered procedure, I authorize the administration of contrast media per Radiology protocol    Answer:   Yes    Order Specific Question:   What is the patient's sedation requirement?    Answer:   Anti-anxiety    Order Specific Question:   Does the patient have a pacemaker or implanted devices?    Answer:   No    Order Specific Question:   Radiology Contrast Protocol - do NOT remove file path    Answer:   [email or web address]    Order Specific Question:   Preferred imaging location?    Answer:   [facility] (table limit-350lbs)  . CBC with Differential/Platelet    Standing Status:   Future    Standing Expiration Date:   03/19/2018  . Comprehensive metabolic panel    Standing Status:   Future    Standing Expiration Date:   03/19/2018  . Lactate dehydrogenase    Standing Status:   Future    Standing Expiration Date:   03/19/2018       Zoila Shutter MD

## 2017-03-27 ENCOUNTER — Other Ambulatory Visit (HOSPITAL_COMMUNITY): Payer: PPO

## 2017-03-31 ENCOUNTER — Ambulatory Visit: Payer: PPO | Attending: Pain Medicine | Admitting: Pain Medicine

## 2017-03-31 ENCOUNTER — Ambulatory Visit (HOSPITAL_COMMUNITY): Payer: PPO | Admitting: Internal Medicine

## 2017-03-31 ENCOUNTER — Other Ambulatory Visit: Payer: Self-pay

## 2017-03-31 ENCOUNTER — Encounter: Payer: Self-pay | Admitting: Pain Medicine

## 2017-03-31 VITALS — BP 144/67 | HR 59 | Temp 97.8°F | Ht 68.0 in | Wt 167.0 lb

## 2017-03-31 DIAGNOSIS — E7800 Pure hypercholesterolemia, unspecified: Secondary | ICD-10-CM | POA: Insufficient documentation

## 2017-03-31 DIAGNOSIS — F17210 Nicotine dependence, cigarettes, uncomplicated: Secondary | ICD-10-CM | POA: Insufficient documentation

## 2017-03-31 DIAGNOSIS — M5441 Lumbago with sciatica, right side: Secondary | ICD-10-CM

## 2017-03-31 DIAGNOSIS — C3410 Malignant neoplasm of upper lobe, unspecified bronchus or lung: Secondary | ICD-10-CM | POA: Insufficient documentation

## 2017-03-31 DIAGNOSIS — Z981 Arthrodesis status: Secondary | ICD-10-CM | POA: Insufficient documentation

## 2017-03-31 DIAGNOSIS — M79604 Pain in right leg: Secondary | ICD-10-CM

## 2017-03-31 DIAGNOSIS — Z9889 Other specified postprocedural states: Secondary | ICD-10-CM | POA: Diagnosis not present

## 2017-03-31 DIAGNOSIS — M542 Cervicalgia: Secondary | ICD-10-CM

## 2017-03-31 DIAGNOSIS — Z5181 Encounter for therapeutic drug level monitoring: Secondary | ICD-10-CM | POA: Insufficient documentation

## 2017-03-31 DIAGNOSIS — M5442 Lumbago with sciatica, left side: Secondary | ICD-10-CM

## 2017-03-31 DIAGNOSIS — F419 Anxiety disorder, unspecified: Secondary | ICD-10-CM | POA: Insufficient documentation

## 2017-03-31 DIAGNOSIS — G893 Neoplasm related pain (acute) (chronic): Secondary | ICD-10-CM | POA: Diagnosis not present

## 2017-03-31 DIAGNOSIS — Z79899 Other long term (current) drug therapy: Secondary | ICD-10-CM | POA: Insufficient documentation

## 2017-03-31 DIAGNOSIS — G8929 Other chronic pain: Secondary | ICD-10-CM | POA: Diagnosis not present

## 2017-03-31 DIAGNOSIS — K219 Gastro-esophageal reflux disease without esophagitis: Secondary | ICD-10-CM | POA: Diagnosis not present

## 2017-03-31 DIAGNOSIS — C01 Malignant neoplasm of base of tongue: Secondary | ICD-10-CM | POA: Diagnosis not present

## 2017-03-31 DIAGNOSIS — G894 Chronic pain syndrome: Secondary | ICD-10-CM | POA: Insufficient documentation

## 2017-03-31 DIAGNOSIS — F329 Major depressive disorder, single episode, unspecified: Secondary | ICD-10-CM | POA: Insufficient documentation

## 2017-03-31 DIAGNOSIS — M79605 Pain in left leg: Secondary | ICD-10-CM

## 2017-03-31 DIAGNOSIS — I10 Essential (primary) hypertension: Secondary | ICD-10-CM | POA: Diagnosis not present

## 2017-03-31 MED ORDER — OXYCODONE HCL 10 MG PO TABS: 10.0000 mg | ORAL_TABLET | Freq: Four times a day (QID) | ORAL | 0 refills | Status: DC | PRN

## 2017-03-31 NOTE — Progress Notes (Signed)
Nursing Pain Medication Assessment:  Safety precautions to be maintained throughout the outpatient stay will include: orient to surroundings, keep bed in low position, maintain call bell within reach at all times, provide assistance with transfer out of bed and ambulation.  Medication Inspection Compliance: Pill count conducted under aseptic conditions, in front of the patient. Neither the pills nor the bottle was removed from the patient's sight at any time. Once count was completed pills were immediately returned to the patient in their original bottle.  Medication: Oxycodone IR Pill/Patch Count: 4 of 14 pills remain Pill/Patch Appearance: Markings consistent with prescribed medication Bottle Appearance: Standard pharmacy container. Clearly labeled. Filled Date: 2 / 19/ 2018 Last Medication intake:  Today

## 2017-03-31 NOTE — Patient Instructions (Signed)
____________________________________________________________________________________________  Medication Rules  Applies to: All patients receiving prescriptions (written or electronic).  Pharmacy of record: Pharmacy where electronic prescriptions will be sent. If written prescriptions are taken to a different pharmacy, please inform the nursing staff. The pharmacy listed in the electronic medical record should be the one where you would like electronic prescriptions to be sent.  Prescription refills: Only during scheduled appointments. Applies to both, written and electronic prescriptions.  NOTE: The following applies primarily to controlled substances (Opioid* Pain Medications).   Patient's responsibilities: 1. Pain Pills: Bring all pain pills to every appointment (except for procedure appointments). 2. Pill Bottles: Bring pills in original pharmacy bottle. Always bring newest bottle. Bring bottle, even if empty. 3. Medication refills: You are responsible for knowing and keeping track of what medications you need refilled. The day before your appointment, write a list of all prescriptions that need to be refilled. Bring that list to your appointment and give it to the admitting nurse. Prescriptions will be written only during appointments. If you forget a medication, it will not be "Called in", "Faxed", or "electronically sent". You will need to get another appointment to get these prescribed. 4. Prescription Accuracy: You are responsible for carefully inspecting your prescriptions before leaving our office. Have the discharge nurse carefully go over each prescription with you, before taking them home. Make sure that your name is accurately spelled, that your address is correct. Check the name and dose of your medication to make sure it is accurate. Check the number of pills, and the written instructions to make sure they are clear and accurate. Make sure that you are given enough medication to last  until your next medication refill appointment. 5. Taking Medication: Take medication as prescribed. Never take more pills than instructed. Never take medication more frequently than prescribed. Taking less pills or less frequently is permitted and encouraged, when it comes to controlled substances (written prescriptions).  6. Inform other Doctors: Always inform, all of your healthcare providers, of all the medications you take. 7. Pain Medication from other Providers: You are not allowed to accept any additional pain medication from any other Doctor or Healthcare provider. There are two exceptions to this rule. (see below) In the event that you require additional pain medication, you are responsible for notifying us, as stated below. 8. Medication Agreement: You are responsible for carefully reading and following our Medication Agreement. This must be signed before receiving any prescriptions from our practice. Safely store a copy of your signed Agreement. Violations to the Agreement will result in no further prescriptions. (Additional copies of our Medication Agreement are available upon request.) 9. Laws, Rules, & Regulations: All patients are expected to follow all Federal and State Laws, Statutes, Rules, & Regulations. Ignorance of the Laws does not constitute a valid excuse. The use of any illegal substances is prohibited. 10. Adopted CDC guidelines & recommendations: Target dosing levels will be at or below 60 MME/day. Use of benzodiazepines** is not recommended.  Exceptions: There are only two exceptions to the rule of not receiving pain medications from other Healthcare Providers. 1. Exception #1 (Emergencies): In the event of an emergency (i.e.: accident requiring emergency care), you are allowed to receive additional pain medication. However, you are responsible for: As soon as you are able, call our office [phone], at any time of the day or night, and leave a message stating your name, the  date and nature of the emergency, and the name and dose of the medication   prescribed. In the event that your call is answered by a member of our staff, make sure to document and save the date, time, and the name of the person that took your information.  2. Exception #2 (Planned Surgery): In the event that you are scheduled by another doctor or dentist to have any type of surgery or procedure, you are allowed (for a period no longer than 30 days), to receive additional pain medication, for the acute post-op pain. However, in this case, you are responsible for picking up a copy of our "Post-op Pain Management for Surgeons" handout, and giving it to your surgeon or dentist. This document is available at our office, and does not require an appointment to obtain it. Simply go to our office during business hours (Monday-Thursday from 8:00 AM to 4:00 PM) (Friday 8:00 AM to 12:00 Noon) or if you have a scheduled appointment with [place], prior to your surgery, and ask for it by name. In addition, you will need to provide [place] with your name, name of your surgeon, type of surgery, and date of procedure or surgery.  *Opioid medications include: morphine, codeine, oxycodone, oxymorphone, hydrocodone, hydromorphone, meperidine, tramadol, tapentadol, buprenorphine, fentanyl, methadone. **Benzodiazepine medications include: diazepam (Valium), alprazolam (Xanax), clonazepam (Klonopine), lorazepam (Ativan), clorazepate (Tranxene), chlordiazepoxide (Librium), estazolam (Prosom), oxazepam (Serax), temazepam (Restoril), triazolam (Halcion) ____________________________________________________________________________________________ ____________________________________________________________________________________________  Medication Recommendations and Reminders  Applies to: All patients receiving prescriptions (written and/or electronic).  Medication Rules & Regulations: These rules and regulations exist for your safety and  that of others. They are not flexible and neither are we. Dismissing or ignoring them will be considered "non-compliance" with medication therapy, resulting in complete and irreversible termination of such therapy. (See document titled "Medication Rules" for more details.) In all conscience, because of safety reasons, we cannot continue providing a therapy where the patient does not follow instructions.  Pharmacy of record:   Definition: This is the pharmacy where your electronic prescriptions will be sent.   We do not endorse any particular pharmacy.  You are not restricted in your choice of pharmacy.  The pharmacy listed in the electronic medical record should be the one where you want electronic prescriptions to be sent.  If you choose to change pharmacy, simply notify our nursing staff of your choice of new pharmacy.  Recommendations:  Keep all of your pain medications in a safe place, under lock and key, even if you live alone.   After you fill your prescription, take 1 week's worth of pills and put them away in a safe place. You should keep a separate, properly labeled bottle for this purpose. The remainder should be kept in the original bottle. Use this as your primary supply, until it runs out. Once it's gone, then you know that you have 1 week's worth of medicine, and it is time to come in for a prescription refill. If you do this correctly, it is unlikely that you will ever run out of medicine.  To make sure that the above recommendation works, it is very important that you make sure your medication refill appointments are scheduled at least 1 week before you run out of medicine. To do this in an effective manner, make sure that you do not leave the office without scheduling your next medication management appointment. Always ask the nursing staff to show you in your prescription , when your medication will be running out. Then arrange for the receptionist to get you a return  appointment, at least 7 days  before you run out of medicine. Do not wait until you have 1 or 2 pills left, to come in. This is very poor planning and does not take into consideration that we may need to cancel appointments due to bad weather, sickness, or emergencies affecting our staff.  Prescription refills and/or changes in medication(s):   Prescription refills, and/or changes in dose or medication, will be conducted only during scheduled medication management appointments. (Applies to both, written and electronic prescriptions.)  No medication will be changed or started on procedure days. No changes, adjustments, and/or refills will be conducted on a procedure day. Doing so will interfere with the diagnostic portion of the procedure.  No phone refills. No medications will be "called into the pharmacy".  No Fax refills.  No weekend refills.  No Holliday refills.  No after hours refills.  Remember:  Business hours are:  Monday to Thursday 8:00 AM to 4:00 PM Provider's Schedule: Dionisio David, NP - Appointments are:  Medication management: Monday to Thursday 8:00 AM to 4:00 PM Milinda Pointer, MD - Appointments are:  Medication management: Monday and Wednesday 8:00 AM to 4:00 PM Procedures: Tuesday and Thursday 7:30 AM to 4:00 PM Gillis Santa, MD - Appointments are:  Medication management: Tuesday and Thursday 8:00 AM to 4:00 PM Procedures days: Monday and Wednesday 7:30 AM to 4:00 PM ____________________________________________________________________________________________

## 2017-03-31 NOTE — Progress Notes (Signed)
Patient's Name: Darrell White  MRN: [id number]  Referring Provider: [name], MD  DOB: 04-21-1950  PCP: [name], MD  DOS: 03/31/2017  Note by: Gaspar Cola, MD  Service setting: Ambulatory outpatient  Specialty: Interventional Pain Management  Location: [facility] (AMB) Pain Management Facility    Patient type: Established   Primary Reason(s) for Visit: Encounter for prescription drug management. (Level of risk: moderate)  CC: Back Pain (lower back)  HPI  Darrell White is a 67 y.o. year old, male patient, who comes today for a medication management evaluation. 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); Chronic low back pain (Primary Area of Pain) (Bilateral) (R>L); Chronic lower extremity pain (Secondary Area of Pain) (Bilateral) (R>L); Failed back surgical syndrome; Chronic neck pain (Tertiary Area of Pain) (Left); History of cervical spinal surgery; Chronic pain syndrome; Long term (current) use of opiate analgesic; Long term prescription opiate use; Opiate use; Cancer-related pain; Neurogenic pain; Neuropathic pain; Chronic musculoskeletal pain; Disorder of skeletal system; Pharmacologic therapy; Problems influencing health status; DDD (degenerative disc disease), cervical; DDD (degenerative disc disease), lumbar; Cervical spondylosis; Lumbar spondylosis; Adjustment and management of infusion pump; Lumbar facet syndrome (Bilateral); and Osteoarthritis of lumbar spine on their problem list. His primarily concern today is the Back Pain (lower back)  Pain Assessment: Location: Lower Back Radiating: Radiates down both leg  Onset: More than a month ago Duration: Chronic pain Quality: Constant, Throbbing, Discomfort Severity: 8 /10 (self-reported pain score)  Note: Reported level is inconsistent with clinical observations. Clinically the patient looks like a 3/10 A  3/10 is viewed as "Moderate" and described as significantly interfering with activities of daily living (ADL). It becomes difficult to feed, bathe, get dressed, get on and off the toilet or to perform personal hygiene functions. Difficult to get in and out of bed or a chair without assistance. Very distracting. With effort, it can be ignored when deeply involved in activities. Score may indicate symptom exaggeration.  I have explained to the patient about the pain scale and I have provided him with multiple copies.  At this point, I have no doubt that there is "symptom exaggeration". When using our objective Pain Scale, levels between 6 and 10/10 are said to belong in an emergency room, as it progressively worsens from a 6/10, described as severely limiting, requiring emergency care not usually available at an outpatient pain management facility. At a 6/10 level, communication becomes difficult and requires great effort. Assistance to reach the emergency department may be required. Facial flushing and profuse sweating along with potentially dangerous increases in heart rate and blood pressure will be evident. Timing: Constant Modifying factors: pain pump,  Darrell White was last scheduled for an appointment on 02/20/2017 for medication management. During today's appointment we reviewed Darrell White chronic pain status, as well as his outpatient medication regimen. RLEP - stops at knee all around. Same pain since 1991. Throbbing pulsating pain. Worse at night. Worsened with how long he is standing. LLEP - identical to right. Worse since drom in medicine. LBP (B) (R>L).  He is insistent that he wants to go back to taking oral pain medications the way that he used to.  He describes taking up to 40 mg of oxycodone 4 times a day (160 mg/day of oxycodone) (270 MME/day).  Physical exam today was positive for bilateral hip joint pain and arthropathy.  I have offered him bilateral intra-articular hip joint injections to  improve some of this pain but he has turned this down.  I have also offered him bilateral lumbar facet blocks, but again he has turned this down.  He indicates that he wants no injections.  I have explained to the patient that that may help his pain significantly and that this is what I specialize in, but by him trying to stay with the oral medications only, I will not be able to optimize what I can do for him.  The patient  reports that he does not use drugs. His body mass index is 25.39 kg/m.  Further details on both, my assessment(s), as well as the proposed treatment plan, please see below.  Controlled Substance Pharmacotherapy Assessment REMS (Risk Evaluation and Mitigation Strategy)  Analgesic: Oxycodone IR 5 mg 1 tablet p.o. twice daily (the patient is at the tail and of a taper to complete a "Drug Holiday".) MME/day: 15 mg/day.  Chauncey Fischer, RN  03/31/2017  9:48 AM  Sign at close encounter Nursing Pain Medication Assessment:  Safety precautions to be maintained throughout the outpatient stay will include: orient to surroundings, keep bed in low position, maintain call bell within reach at all times, provide assistance with transfer out of bed and ambulation.  Medication Inspection Compliance: Pill count conducted under aseptic conditions, in front of the patient. Neither the pills nor the bottle was removed from the patient's sight at any time. Once count was completed pills were immediately returned to the patient in their original bottle.  Medication: Oxycodone IR Pill/Patch Count: 4 of 14 pills remain Pill/Patch Appearance: Markings consistent with prescribed medication Bottle Appearance: Standard pharmacy container. Clearly labeled. Filled Date: 2 / 19/ 2018 Last Medication intake:  Today   Pharmacokinetics: Liberation and absorption (onset of action): WNL Distribution (time to peak effect): WNL Metabolism and excretion (duration of action): WNL          Pharmacodynamics: Desired effects: Analgesia: Darrell White reports >50% benefit. Functional ability: Patient reports that medication allows him to accomplish basic ADLs Clinically meaningful improvement in function (CMIF): Sustained CMIF goals met Perceived effectiveness: Described as relatively effective, allowing for increase in activities of daily living (ADL) Undesirable effects: Side-effects or Adverse reactions: None reported Monitoring: [place] PMP: Online review of the past 65-monthperiod conducted. Compliant with practice rules and regulations Last UDS on record: Summary  Date Value Ref Range Status  10/16/2016 FINAL  Final    Comment:    ==================================================================== [facility] COMP DRUG ANALYSIS,UR ==================================================================== Test                             Result       Flag       Units Drug Present and Declared for Prescription Verification   Oxycodone                      >3534        EXPECTED   ng/mg creat   Oxymorphone                    3447         EXPECTED   ng/mg creat   Noroxycodone                   >3534        EXPECTED   ng/mg creat   Noroxymorphone  701          EXPECTED   ng/mg creat    Sources of oxycodone are scheduled prescription medications.    Oxymorphone, noroxycodone, and noroxymorphone are expected    metabolites of oxycodone. Oxymorphone is also available as a    scheduled prescription medication.   Gabapentin                     PRESENT      EXPECTED   Mirtazapine                    PRESENT      EXPECTED   Trazodone                      PRESENT      EXPECTED   1,3 chlorophenyl piperazine    PRESENT      EXPECTED    1,3-chlorophenyl piperazine is an expected metabolite of    trazodone.   Metoprolol                     PRESENT      EXPECTED Drug Present not Declared for Prescription Verification   Morphine                       1194         EXPECTED ng/mg  creat (from ITP)   Normorphine                    26           EXPECTED ng/mg creat    Potential sources of large amounts of morphine in the absence of    codeine include administration of morphine or use of heroin.    Normorphine is an expected metabolite of morphine. Drug Absent but Declared for Prescription Verification   Salicylate                     Not Detected UNEXPECTED    Aspirin, as indicated in the declared medication list, is not    always detected even when used as directed. ==================================================================== Test                      Result    Flag   Units      Ref Range   Creatinine              283              mg/dL      >=20 ==================================================================== Declared Medications:  The flagging and interpretation on this report are based on the  following declared medications.  Unexpected results may arise from  inaccuracies in the declared medications.  **Note: The testing scope of this panel includes these medications:  Gabapentin  Metoprolol  Mirtazapine  Oxycodone  Trazodone  **Note: The testing scope of this panel does not include small to  moderate amounts of these reported medications:  Aspirin (Aspirin 81)  **Note: The testing scope of this panel does not include following  reported medications:  Atorvastatin  Cyanocobalamin  Docusate  Levothyroxine  Omeprazole ==================================================================== For clinical consultation, please call [phone]. ====================================================================    UDS interpretation: Compliant          Medication Assessment Form: Reviewed. Patient indicates being compliant with therapy Treatment compliance: Compliant Risk Assessment Profile: Aberrant behavior: See prior evaluations. None observed or  detected today Comorbid factors increasing risk of overdose: See prior notes. No additional  risks detected today Risk of substance use disorder (SUD): Low  ORT Scoring interpretation table:  Score <3 = Low Risk for SUD  Score between 4-7 = Moderate Risk for SUD  Score >8 = High Risk for Opioid Abuse   Risk Mitigation Strategies:  Patient Counseling: Covered Patient-Prescriber Agreement (PPA): Present and active  Notification to other healthcare providers: Done  Pharmacologic Plan: No change in therapy, at this time.             Laboratory Chemistry  Inflammation Markers (CRP: Acute Phase) (ESR: Chronic Phase) Lab Results  Component Value Date   CRP 1.0 10/16/2016   ESRSEDRATE 3 10/16/2016                         Renal Function Markers Lab Results  Component Value Date   BUN 10 11/18/2016   CREATININE 0.90 03/14/2017   GFRAA >60 11/18/2016   GFRNONAA >60 11/18/2016                 Hepatic Function Markers Lab Results  Component Value Date   AST 16 10/16/2016   ALT 8 10/16/2016   ALBUMIN 4.4 10/16/2016   ALKPHOS 47 10/16/2016                 Electrolytes Lab Results  Component Value Date   NA 136 11/18/2016   K 4.1 11/18/2016   CL 100 (L) 11/18/2016   CALCIUM 9.4 11/18/2016   MG 2.0 10/16/2016                        Neuropathy Markers Lab Results  Component Value Date   QVZDGLOV56 433 10/16/2016                 Bone Pathology Markers Lab Results  Component Value Date   25OHVITD1 47 10/16/2016   25OHVITD2 <1.0 10/16/2016   25OHVITD3 47 10/16/2016                         Coagulation Parameters Lab Results  Component Value Date   INR 1.06 05/20/2016   LABPROT 13.8 05/20/2016   APTT 37 (H) 05/20/2016   PLT 201 11/18/2016                 Cardiovascular Markers Lab Results  Component Value Date   HGB 13.6 11/18/2016   HCT 40.9 11/18/2016                 Note: Lab results reviewed.  Recent Diagnostic Imaging Results  CT Chest W Contrast CLINICAL DATA:  Squamous cell carcinoma of the tongue and RIGHT upper lobe. Patient status  post radiation therapy.  EXAM: CT CHEST WITH CONTRAST  TECHNIQUE: Multidetector CT imaging of the chest was performed during intravenous contrast administration.  CONTRAST:  20m ISOVUE-300 IOPAMIDOL (ISOVUE-300) INJECTION 61%  COMPARISON:  PET-CT scan 11/14/2016  FINDINGS: Cardiovascular: Coronary artery calcification and aortic atherosclerotic calcification.  Mediastinum/Nodes: No axillary or supraclavicular lymphadenopathy. RIGHT lower paratracheal node measures 10 mm short axis. RIGHT hilar lymph node measures 12 mm short axis. These small mediastinal nodes are not significant changed from comparison PET-CT scan.  Lungs/Pleura: RIGHT upper lobe nodular lesion measures 15 x 10 mm compared to 15 by 13 mm for no interval change. There is perilesional consolidation extending lung apex not changed. There is  a reticular pattern surrounding the nodule which is also similar to slightly increased in consistent radiation change.  Small nodule in the RIGHT middle lobe measuring 4 mm (image 81, series 4) compares to 2 mm on prior for mild interval increase in size.  The LEFT lung there is mild peri mediastinal radiation change.  Small nodule in the LEFT lower lobe measures 3 mm (image 131, series 4) which is unchanged.  Upper Abdomen: Limited view of the liver, kidneys, pancreas are unremarkable. Normal adrenal glands.  Musculoskeletal: No aggressive osseous lesion.  IMPRESSION: 1. No significant change in the RIGHT upper lobe nodule with a surrounding radiation change. 2. Interval increase in size of small (4 mm) RIGHT middle lobe nodule. Recommend close attention follow-up. 3. Stable small LEFT lower lobe nodule. 4. Stable mild mediastinal and RIGHT adenopathy.  Aortic Atherosclerosis (ICD10-I70.0).  Electronically Signed   By: Suzy Bouchard M.D.   On: 03/14/2017 13:43  Complexity Note: Imaging results reviewed. Results shared with Darrell White, using Layman's terms.                          Meds   Current Outpatient Medications:  .  atorvastatin (LIPITOR) 20 MG tablet, Take 20 mg by mouth daily. , Disp: , Rfl:  .  Docusate Calcium (STOOL SOFTENER PO), Take 1 tablet by mouth daily as needed (constipation)., Disp: , Rfl:  .  gabapentin (NEURONTIN) 300 MG capsule, Take 600 mg by mouth daily. , Disp: , Rfl:  .  levothyroxine (SYNTHROID, LEVOTHROID) 75 MCG tablet, Take 75 mcg by mouth daily before breakfast., Disp: , Rfl:  .  metoprolol succinate (TOPROL-XL) 25 MG 24 hr tablet, Take 25 mg by mouth at bedtime., Disp: , Rfl:  .  mirtazapine (REMERON) 45 MG tablet, Take 45 mg by mouth at bedtime., Disp: , Rfl:  .  omeprazole (PRILOSEC) 20 MG capsule, Take 20 mg by mouth daily as needed (heartburn)., Disp: , Rfl:  .  [START ON 04/23/2017] Oxycodone HCl 10 MG TABS, Take 1 tablet (10 mg total) by mouth every 6 (six) hours as needed., Disp: 120 tablet, Rfl: 0 .  [START ON 05/23/2017] Oxycodone HCl 10 MG TABS, Take 1 tablet (10 mg total) by mouth every 6 (six) hours as needed., Disp: 120 tablet, Rfl: 0 .  [START ON 06/22/2017] Oxycodone HCl 10 MG TABS, Take 1 tablet (10 mg total) by mouth every 6 (six) hours as needed., Disp: 120 tablet, Rfl: 0 .  traZODone (DESYREL) 50 MG tablet, Take 200 mg by mouth at bedtime. , Disp: , Rfl:  .  vitamin B-12 (CYANOCOBALAMIN) 1000 MCG tablet, Take 2,000 mcg by mouth every evening., Disp: , Rfl:   ROS  Constitutional: Denies any fever or chills Gastrointestinal: No reported hemesis, hematochezia, vomiting, or acute GI distress Musculoskeletal: Denies any acute onset joint swelling, redness, loss of ROM, or weakness Neurological: No reported episodes of acute onset apraxia, aphasia, dysarthria, agnosia, amnesia, paralysis, loss of coordination, or loss of consciousness  Allergies  Darrell White has No Known Allergies.  [place]  Drug: Darrell White  reports that he does not use drugs. Alcohol:  reports that he does not drink alcohol. Tobacco:   reports that he has been smoking cigarettes.  He has a 55.00 pack-year smoking history. he has never used smokeless tobacco. 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 Surgical: Darrell White  has a past surgical history that includes Mouth surgery; Cervical disc surgery; Shoulder arthroscopy; Back surgery; Spinal fusion; Spine surgery; Knee arthroscopy; Cataract extraction w/PHACO (Left, 04/09/2012); Direct laryngoscopy (N/A, 03/26/2016); Excision of tongue lesion (Right, 03/26/2016); and OTHER SURGICAL HISTORY. Family: family history includes Cancer in his brother and father.  Constitutional Exam  General appearance: Well nourished, well developed, and well hydrated. In no apparent acute distress Vitals:   03/31/17 0945  BP: (!) 144/67  Pulse: (!) 59  Temp: 97.8 F (36.6 C)  SpO2: 100%  Weight: 167 lb (75.8 kg)  Height: 5' 8" (1.727 m)   BMI Assessment: Estimated body mass index is 25.39 kg/m as calculated from the following:   Height as of this encounter: 5' 8" (1.727 m).   Weight as of this encounter: 167 lb (75.8 kg).  BMI interpretation table: BMI level Category Range association with higher incidence of chronic pain  <18 kg/m2 Underweight   18.5-24.9 kg/m2 Ideal body weight   25-29.9 kg/m2 Overweight Increased incidence by 20%  30-34.9 kg/m2 Obese (Class I) Increased incidence by 68%  35-39.9 kg/m2 Severe obesity (Class II) Increased incidence by 136%  >40 kg/m2 Extreme obesity (Class III) Increased incidence by 254%   BMI Readings from Last 4 Encounters:  03/31/17 25.39 kg/m  02/20/17 22.05 kg/m  01/15/17 23.57 kg/m  01/01/17 23.57 kg/m   Wt Readings from Last 4 Encounters:  03/31/17 167 lb (75.8 kg)  02/20/17  145 lb (65.8 kg)  01/15/17 155 lb (70.3 kg)  01/01/17 155 lb (70.3 kg)  Psych/Mental status: Alert, oriented x 3 (person, place, & time)       Eyes: PERLA Respiratory: No evidence of acute respiratory distress  Cervical Spine Area Exam  Skin & Axial Inspection: No masses, redness, edema, swelling, or associated skin lesions Alignment: Symmetrical Functional ROM: Unrestricted ROM      Stability: No instability detected Muscle Tone/Strength: Functionally intact. No obvious neuro-muscular anomalies detected. Sensory (Neurological): Unimpaired Palpation: No palpable anomalies              Upper Extremity (UE) Exam    Side: Right upper extremity  Side: Left upper extremity  Skin & Extremity Inspection: Skin color, temperature, and hair growth are WNL. No peripheral edema or cyanosis. No masses, redness, swelling, asymmetry, or associated skin lesions. No contractures.  Skin & Extremity Inspection: Skin color, temperature, and hair growth are WNL. No peripheral edema or cyanosis. No masses, redness, swelling, asymmetry, or associated skin lesions. No contractures.  Functional ROM: Unrestricted ROM          Functional ROM: Unrestricted ROM          Muscle Tone/Strength: Functionally intact. No obvious neuro-muscular anomalies detected.  Muscle Tone/Strength: Functionally intact. No obvious neuro-muscular anomalies detected.  Sensory (Neurological): Unimpaired          Sensory (Neurological): Unimpaired          Palpation: No palpable anomalies              Palpation: No palpable anomalies              Specialized Test(s): Deferred         Specialized Test(s): Deferred          Thoracic Spine Area Exam  Skin & Axial Inspection: No masses, redness, or swelling Alignment: Symmetrical Functional ROM: Unrestricted ROM Stability: No instability detected Muscle Tone/Strength: Functionally  intact. No obvious neuro-muscular anomalies detected. Sensory (Neurological): Unimpaired Muscle strength &  Tone: No palpable anomalies  Lumbar Spine Area Exam  Skin & Axial Inspection: No masses, redness, or swelling Alignment: Symmetrical Functional ROM: Minimal ROM, bilaterally Stability: No instability detected Muscle Tone/Strength: Functionally intact. No obvious neuro-muscular anomalies detected. Sensory (Neurological): Movement-associated pain Palpation: Complains of area being tender to palpation       Provocative Tests: Lumbar Hyperextension and rotation test: Positive bilaterally for facet joint pain. Lumbar Lateral bending test: evaluation deferred today       Patrick's Maneuver: Positive             for bilateral hip arthralgia  Gait & Posture Assessment  Ambulation: Unassisted Gait: Relatively normal for age and body habitus Posture: WNL   Lower Extremity Exam    Side: Right lower extremity  Side: Left lower extremity  Skin & Extremity Inspection: Skin color, temperature, and hair growth are WNL. No peripheral edema or cyanosis. No masses, redness, swelling, asymmetry, or associated skin lesions. No contractures.  Skin & Extremity Inspection: Skin color, temperature, and hair growth are WNL. No peripheral edema or cyanosis. No masses, redness, swelling, asymmetry, or associated skin lesions. No contractures.  Functional ROM: Unrestricted ROM          Functional ROM: Unrestricted ROM          Muscle Tone/Strength: Functionally intact. No obvious neuro-muscular anomalies detected.  Muscle Tone/Strength: Functionally intact. No obvious neuro-muscular anomalies detected.  Sensory (Neurological): Unimpaired  Sensory (Neurological): Unimpaired  Palpation: No palpable anomalies  Palpation: No palpable anomalies   Assessment  Primary Diagnosis & Pertinent Problem List: The primary encounter diagnosis was Chronic low back pain (Primary Area of Pain) (Bilateral) (R>L). Diagnoses of Chronic lower extremity pain (Secondary Area of Pain) (Bilateral) (R>L), Chronic neck pain (Tertiary Area of  Pain) (Left), Cancer-related pain, and Chronic pain syndrome were also pertinent to this visit.  Status Diagnosis  Persistent Persistent Persistent 1. Chronic low back pain (Primary Area of Pain) (Bilateral) (R>L)   2. Chronic lower extremity pain (Secondary Area of Pain) (Bilateral) (R>L)   3. Chronic neck pain (Tertiary Area of Pain) (Left)   4. Cancer-related pain   5. Chronic pain syndrome     Problems updated and reviewed during this visit: No problems updated. Plan of Care  Pharmacotherapy (Medications Ordered): Meds ordered this encounter  Medications  . Oxycodone HCl 10 MG TABS    Sig: Take 1 tablet (10 mg total) by mouth every 6 (six) hours as needed.    Dispense:  120 tablet    Refill:  0    Fill one day early if pharmacy is closed on scheduled refill date. Do not fill until: 04/23/17 To last until: 05/23/17  . Oxycodone HCl 10 MG TABS    Sig: Take 1 tablet (10 mg total) by mouth every 6 (six) hours as needed.    Dispense:  120 tablet    Refill:  0    Fill one day early if pharmacy is closed on scheduled refill date. Do not fill until: 05/23/17 To last until: 06/22/17  . Oxycodone HCl 10 MG TABS    Sig: Take 1 tablet (10 mg total) by mouth every 6 (six) hours as needed.    Dispense:  120 tablet    Refill:  0    Fill one day early if pharmacy is closed on scheduled refill date. Do not fill until: 06/22/17 To last until: 07/22/17   Medications administered  today: Darrell White had no medications administered during this visit.  Procedure Orders    No procedure(s) ordered today   Lab Orders  No laboratory test(s) ordered today   Imaging Orders  No imaging studies ordered today   Referral Orders  No referral(s) requested today    Interventional management options: Planned, scheduled, and/or pending:   The patient's pill count shows that he still has 4 pills left on the prescription where he supposed to take 2 pills/day.  This means that he has an  additional prescription for oxycodone IR 5 mg to be taken 1 pill/day x 7 days.  After he completes that on 04/09/2017, then he will be starting his "Drug Holiday" (14 days).  Once this is completed on 04/23/2017, then he should be able to start again on oxycodone IR 10 mg every 6 hours (60 MME/day).  This will be a decrease from the 90 MME/day that he used to take.  In addition to this, we will inquire whether or not we can add some local anesthetic to his intrathecal pump.  He claims that that was attempted in the past, but that the insurance company did not approve it and they were billing him for the difference, which he could not afford and therefore he had that taken out of the pump.   Considering:   Diagnostic bilateral intra-articular hip joint injection  Therapeutic/palliative intrathecal pump refill(s)  Diagnostic bilateral lumbar facet block #1  Possible bilateral lumbar facet RFA Diagnostic caudal epidural steroid injection plus diagnostic epidurogram Possible [place] procedure Diagnostic left cervical epidural steroid injection Diagnosticleft cervical facet block Possible left cervical facet RFA   Palliative PRN treatment(s):   Diagnostic bilateral lumbar facet block #1 under fluoroscopic guidance and IV sedation  Therapeutic/palliative intrathecal pump refill(s)    Provider-requested follow-up: Return for Pump Refill (as per pump program).  Future Appointments  Date Time Provider [place]  06/13/2017 10:00 AM AP-MR 1 AP-MRI [place] [facility]  06/16/2017  4:00 PM AP-PET CT 1 AP-NM [place] [facility]  06/18/2017 11:45 AM Milinda Pointer, MD [facility] None  06/20/2017 10:50 AM Derek Jack, MD [facility] None   Primary Care Physician: [name], MD Location: [facility] Note by: Gaspar Cola, MD Date: 03/31/2017; Time: 11:08 AM

## 2017-04-02 ENCOUNTER — Telehealth: Payer: Self-pay | Admitting: Pain Medicine

## 2017-04-02 NOTE — Telephone Encounter (Signed)
Says he has scripts to fill in march and April but none for now. Please call asap

## 2017-04-02 NOTE — Telephone Encounter (Signed)
Per taper and  for drug holiday per Dr. Dossie Arbour, patient should not start scripts until 04/23/2017. Patient informed.

## 2017-05-29 ENCOUNTER — Other Ambulatory Visit: Payer: Self-pay

## 2017-05-29 DIAGNOSIS — E039 Hypothyroidism, unspecified: Secondary | ICD-10-CM | POA: Diagnosis not present

## 2017-05-29 DIAGNOSIS — F331 Major depressive disorder, recurrent, moderate: Secondary | ICD-10-CM | POA: Diagnosis not present

## 2017-05-29 DIAGNOSIS — M19031 Primary osteoarthritis, right wrist: Secondary | ICD-10-CM | POA: Diagnosis not present

## 2017-05-29 DIAGNOSIS — Z23 Encounter for immunization: Secondary | ICD-10-CM | POA: Diagnosis not present

## 2017-05-29 DIAGNOSIS — F17210 Nicotine dependence, cigarettes, uncomplicated: Secondary | ICD-10-CM | POA: Diagnosis not present

## 2017-05-29 DIAGNOSIS — M545 Low back pain: Secondary | ICD-10-CM | POA: Diagnosis not present

## 2017-05-29 DIAGNOSIS — K219 Gastro-esophageal reflux disease without esophagitis: Secondary | ICD-10-CM | POA: Diagnosis not present

## 2017-05-29 DIAGNOSIS — Z6824 Body mass index (BMI) 24.0-24.9, adult: Secondary | ICD-10-CM | POA: Diagnosis not present

## 2017-05-29 DIAGNOSIS — Z1212 Encounter for screening for malignant neoplasm of rectum: Secondary | ICD-10-CM | POA: Diagnosis not present

## 2017-05-29 DIAGNOSIS — E782 Mixed hyperlipidemia: Secondary | ICD-10-CM | POA: Diagnosis not present

## 2017-05-29 DIAGNOSIS — M19041 Primary osteoarthritis, right hand: Secondary | ICD-10-CM | POA: Diagnosis not present

## 2017-05-29 DIAGNOSIS — C3491 Malignant neoplasm of unspecified part of right bronchus or lung: Secondary | ICD-10-CM | POA: Diagnosis not present

## 2017-05-29 DIAGNOSIS — Z0001 Encounter for general adult medical examination with abnormal findings: Secondary | ICD-10-CM | POA: Diagnosis not present

## 2017-05-29 MED ORDER — PAIN MANAGEMENT IT PUMP REFILL: 1.0000 | Freq: Once | INTRATHECAL | 0 refills | Status: AC

## 2017-06-13 ENCOUNTER — Other Ambulatory Visit (HOSPITAL_COMMUNITY): Payer: Self-pay | Admitting: Internal Medicine

## 2017-06-13 ENCOUNTER — Ambulatory Visit (HOSPITAL_COMMUNITY)
Admission: RE | Admit: 2017-06-13 | Discharge: 2017-06-13 | Disposition: A | Discharge status: Home / Self Care | Payer: PPO | Source: Ambulatory Visit | Attending: Internal Medicine | Admitting: Internal Medicine

## 2017-06-13 DIAGNOSIS — C3490 Malignant neoplasm of unspecified part of unspecified bronchus or lung: Secondary | ICD-10-CM

## 2017-06-13 DIAGNOSIS — C3411 Malignant neoplasm of upper lobe, right bronchus or lung: Secondary | ICD-10-CM

## 2017-06-16 ENCOUNTER — Encounter (HOSPITAL_COMMUNITY)
Admission: RE | Admit: 2017-06-16 | Discharge: 2017-06-16 | Disposition: A | Discharge status: Home / Self Care | Payer: PPO | Source: Ambulatory Visit | Attending: Internal Medicine | Admitting: Internal Medicine

## 2017-06-16 DIAGNOSIS — C3490 Malignant neoplasm of unspecified part of unspecified bronchus or lung: Secondary | ICD-10-CM

## 2017-06-16 DIAGNOSIS — C3491 Malignant neoplasm of unspecified part of right bronchus or lung: Secondary | ICD-10-CM | POA: Diagnosis not present

## 2017-06-16 HISTORY — PX: CT LUNG SCREENING: HXRAD848

## 2017-06-16 MED ORDER — FLUDEOXYGLUCOSE F - 18 (FDG) INJECTION
10.0000 | Freq: Once | INTRAVENOUS | Status: AC | PRN
  Administered 2017-06-16: 10.55 via INTRAVENOUS

## 2017-06-17 ENCOUNTER — Ambulatory Visit (HOSPITAL_COMMUNITY)
Admission: RE | Admit: 2017-06-17 | Discharge: 2017-06-17 | Disposition: A | Discharge status: Home / Self Care | Payer: PPO | Source: Ambulatory Visit | Attending: Internal Medicine | Admitting: Internal Medicine

## 2017-06-17 DIAGNOSIS — C3411 Malignant neoplasm of upper lobe, right bronchus or lung: Secondary | ICD-10-CM | POA: Diagnosis not present

## 2017-06-17 DIAGNOSIS — C3490 Malignant neoplasm of unspecified part of unspecified bronchus or lung: Secondary | ICD-10-CM | POA: Diagnosis not present

## 2017-06-17 LAB — POCT I-STAT CREATININE: CREATININE: 1 mg/dL (ref 0.61–1.24)

## 2017-06-17 MED ORDER — GADOBENATE DIMEGLUMINE 529 MG/ML IV SOLN
15.0000 mL | Freq: Once | INTRAVENOUS | Status: AC | PRN
  Administered 2017-06-17: 15 mL via INTRAVENOUS

## 2017-06-17 NOTE — Progress Notes (Signed)
Patient's Name: Darrell White  MRN: [id number]  Referring Provider: [name], MD  DOB: May 05, 1950  PCP: [name], MD  DOS: 06/18/2017  Note by: Gaspar Cola, MD  Service setting: Ambulatory outpatient  Specialty: Interventional Pain Management  Patient type: Established  Location: [facility] (AMB) Pain Management Facility  Visit type: Interventional Procedure   Primary Reason for Visit: Interventional Pain Management Treatment. CC: Back Pain (lower)  Procedure:  Intrathecal Drug Delivery System (IDDS):  Type: Reservoir Refill [phone])       Region: Abdominal Laterality: Right  Type of Pump: Medtronic Synchromed II (MRI-compatible) Delivery Route: Intrathecal Type of Pain Treated: Neuropathic/Nociceptive Primary Medication Class: Opioid/opiate  Medication, Concentration, Infusion Program, & Delivery Rate: Please see scanned programming printout.   Indications: 1. Cancer-related pain   2. Failed back surgical syndrome   3. Chronic low back pain (Primary Area of Pain) (Bilateral) (R>L)   4. Chronic lower extremity pain (Secondary Area of Pain) (Bilateral) (R>L)   5. Chronic pain syndrome   6. Long term (current) use of opiate analgesic    Pain Assessment: Self-Reported Pain Score: 8 /10             Reported level is compatible with observation.        Intrathecal Pump Therapy Assessment  Manufacturer: Medtronic Synchromed II Type: Programmable Volume: 40 mL reservoir MRI compatibility: Yes   Drug content:  Primary Medication Class: Opioid Primary Medication: PF-Morphine 20 mg/mL Secondary Medication: None Other Medication: None   Programming:  Type: Simple continuous. See pump readout for details.   Changes:  Medication Change: None at this point Rate Change: No change in rate  Reported side-effects or adverse reactions: None reported  Effectiveness: Described as relatively effective, allowing for increase in activities of daily living (ADL) Clinically  meaningful improvement in function (CMIF): Sustained CMIF goals met  Plan: Pump refill today  Pre-op Assessment:  Mr. Humbarger is a 67 y.o. (year old), male patient, seen today for interventional treatment. He  has a past surgical history that includes Mouth surgery; Cervical disc surgery; Shoulder arthroscopy; Back surgery; Spinal fusion; Spine surgery; Knee arthroscopy; Cataract extraction w/PHACO (Left, 04/09/2012); Direct laryngoscopy (N/A, 03/26/2016); Excision of tongue lesion (Right, 03/26/2016); OTHER SURGICAL HISTORY; and CT LUNG SCREENING (06/16/2017). Mr. Papadopoulos has a current medication list which includes the following prescription(s): atorvastatin, docusate calcium, gabapentin, levothyroxine, mirtazapine, omeprazole, oxycodone hcl, oxycodone hcl, trazodone, and oxycodone hcl. His primarily concern today is the Back Pain (lower)  Initial Vital Signs:  Pulse/HCG Rate: 63  Temp: 97.9 F (36.6 C) Resp:   BP: 125/71 SpO2: 100 %  BMI: Estimated body mass index is 23.63 kg/m as calculated from the following:   Height as of this encounter: '5\' 9"'$  (1.753 m).   Weight as of this encounter: 160 lb (72.6 kg).  Risk Assessment: Allergies: Reviewed. He has No Known Allergies.  Allergy Precautions: None required Coagulopathies: Reviewed. None identified.  Blood-thinner therapy: None at this time Active Infection(s): Reviewed. None identified. Mr. Swiatek is afebrile  Site Confirmation: Mr. Pietrzyk was asked to confirm the procedure and laterality before marking the site Procedure checklist: Completed Consent: Before the procedure and under the influence of no sedative(s), amnesic(s), or anxiolytics, the patient was informed of the treatment options, risks and possible complications. To fulfill our ethical and legal obligations, as recommended by the American Medical Association's Code of Ethics, I have informed the patient of my clinical impression; the nature and purpose of the treatment or  procedure;  the risks, benefits, and possible complications of the intervention; the alternatives, including doing nothing; the risk(s) and benefit(s) of the alternative treatment(s) or procedure(s); and the risk(s) and benefit(s) of doing nothing.  Mr. Coburn was provided with information about the general risks and possible complications associated with most interventional procedures. These include, but are not limited to: failure to achieve desired goals, infection, bleeding, organ or nerve damage, allergic reactions, paralysis, and/or death.  In addition, he was informed of those risks and possible complications associated to this particular procedure, which include, but are not limited to: damage to the implant; failure to decrease pain; local, systemic, or serious CNS infections, intraspinal abscess with possible cord compression and paralysis, or life-threatening such as meningitis; bleeding; organ damage; nerve injury or damage with subsequent sensory, motor, and/or autonomic system dysfunction, resulting in transient or permanent pain, numbness, and/or weakness of one or several areas of the body; allergic reactions, either minor or major life-threatening, such as anaphylactic or anaphylactoid reactions.  Furthermore, Mr. Zoll was informed of those risks and complications associated with the medications. These include, but are not limited to: allergic reactions (i.e.: anaphylactic or anaphylactoid reactions); endorphine suppression; bradycardia and/or hypotension; water retention and/or peripheral vascular relaxation leading to lower extremity edema and possible stasis ulcers; respiratory depression and/or shortness of breath; decreased metabolic rate leading to weight gain; swelling or edema; medication-induced neural toxicity; particulate matter embolism and blood vessel occlusion with resultant organ, and/or nervous system infarction; and/or intrathecal granuloma formation with possible spinal cord compression  and permanent paralysis.  Before refilling the pump Mr. Pilley was informed that some of the medications used in the devise may not be FDA approved for such use and therefore it constitutes an off-label use of the medications.  Finally, he was informed that Medicine is not an exact science; therefore, there is also the possibility of unforeseen or unpredictable risks and/or possible complications that may result in a catastrophic outcome. The patient indicated having understood very clearly. We have given the patient no guarantees and we have made no promises. Enough time was given to the patient to ask questions, all of which were answered to the patient's satisfaction. Mr. Steinke has indicated that he wanted to continue with the procedure. Attestation: I, the ordering provider, attest that I have discussed with the patient the benefits, risks, side-effects, alternatives, likelihood of achieving goals, and potential problems during recovery for the procedure that I have provided informed consent. Date  Time: 06/18/2017 12:08 PM  Pre-Procedure Preparation:  Monitoring: As per clinic protocol. Respiration, ETCO2, SpO2, BP, heart rate and rhythm monitor placed and checked for adequate function Safety Precautions: Patient was assessed for positional comfort and pressure points before starting the procedure. Time-out: I initiated and conducted the "Time-out" before starting the procedure, as per protocol. The patient was asked to participate by confirming the accuracy of the "Time Out" information. Verification of the correct person, site, and procedure were performed and confirmed by me, the nursing staff, and the patient. "Time-out" conducted as per Joint Commission's Universal Protocol (UP.01.01.01). Time: 1236  Description of Procedure Process:   Position: Supine Target Area: Central-port of intrathecal pump. Approach: Anterior, 90 degree angle approach. Area Prepped: Entire Area around the pump  implant. Prepping solution: ChloraPrep (2% chlorhexidine gluconate and 70% isopropyl alcohol) Safety Precautions: Aspiration looking for blood return was conducted prior to all injections. At no point did we inject any substances, as a needle was being advanced. No attempts were made at  seeking any paresthesias. Safe injection practices and needle disposal techniques used. Medications properly checked for expiration dates. SDV (single dose vial) medications used. Description of the Procedure: Protocol guidelines were followed. Two nurses trained to do implant refills were present during the entire procedure. The refill medication was checked by both healthcare providers as well as the patient. The patient was included in the "Time-out" to verify the medication. The patient was placed in position. The pump was identified. The area was prepped in the usual manner. The sterile template was positioned over the pump, making sure the side-port location matched that of the pump. Both, the pump and the template were held for stability. The needle provided in the Medtronic Kit was then introduced thru the center of the template and into the central port. The pump content was aspirated and discarded volume documented. The new medication was slowly infused into the pump, thru the filter, making sure to avoid overpressure of the device. The needle was then removed and the area cleansed, making sure to leave some of the prepping solution back to take advantage of its long term bactericidal properties. The pump was interrogated and programmed to reflect the correct medication, volume, and dosage. The program was printed and taken to the physician for approval. Once checked and signed by the physician, a copy was provided to the patient and another scanned into the EMR. Vitals:   06/18/17 1205  BP: 125/71  Pulse: 63  Temp: 97.9 F (36.6 C)  SpO2: 100%  Weight: 160 lb (72.6 kg)  Height: '5\' 9"'$  (1.753 m)    Start Time:  1238 hrs. End Time: 1247 hrs. Materials & Medications: Medtronic Refill Kit Medication(s): Please see chart orders for details.  Imaging Guidance:  Type of Imaging Technique: None used Indication(s): N/A Exposure Time: No patient exposure Contrast: None used. Fluoroscopic Guidance: N/A Ultrasound Guidance: N/A Interpretation: N/A  Antibiotic Prophylaxis:   Anti-infectives (From admission, onward)   None     Indication(s): None identified  Post-operative Assessment:  Post-procedure Vital Signs:  Pulse/HCG Rate: 63  Temp: 97.9 F (36.6 C) Resp:   BP: 125/71 SpO2: 100 %  EBL: None  Complications: No immediate post-treatment complications observed by team, or reported by patient.  Note: The patient tolerated the entire procedure well. A repeat set of vitals were taken after the procedure and the patient was kept under observation following institutional policy, for this type of procedure. Post-procedural neurological assessment was performed, showing return to baseline, prior to discharge. The patient was provided with post-procedure discharge instructions, including a section on how to identify potential problems. Should any problems arise concerning this procedure, the patient was given instructions to immediately contact us, at any time, without hesitation. In any case, we plan to contact the patient by telephone for a follow-up status report regarding this interventional procedure.  Comments:  No additional relevant information.  Controlled Substance Pharmacotherapy Assessment REMS (Risk Evaluation and Mitigation Strategy)  Analgesic: Oxycodone IR 10 mg every 6 hours (40 mg/day oxycodone) MME/day: 60 mg/day.  Landis Martins, RN  06/18/2017 12:42 PM  Sign at close encounter Nursing Pain Medication Assessment:  Safety precautions to be maintained throughout the outpatient stay will include: orient to surroundings, keep bed in low position, maintain call bell within reach  at all times, provide assistance with transfer out of bed and ambulation.  Medication Inspection Compliance: Pill count conducted under aseptic conditions, in front of the patient. Neither the pills nor the bottle was removed from  the patient's sight at any time. Once count was completed pills were immediately returned to the patient in their original bottle.  Medication: Oxycodone IR Pill/Patch Count: 16 of 120 pills remain Pill/Patch Appearance: Markings consistent with prescribed medication Bottle Appearance: Standard pharmacy container. Clearly labeled. Filled Date: 4 / 24 / 2019 Last Medication intake:  Today   Pharmacokinetics: Liberation and absorption (onset of action): WNL Distribution (time to peak effect): WNL Metabolism and excretion (duration of action): WNL         Pharmacodynamics: Desired effects: Analgesia: Mr. Utz reports >50% benefit. Functional ability: Patient reports that medication allows him to accomplish basic ADLs Clinically meaningful improvement in function (CMIF): Sustained CMIF goals met Perceived effectiveness: Described as relatively effective, allowing for increase in activities of daily living (ADL) Undesirable effects: Side-effects or Adverse reactions: None reported Monitoring: [place] PMP: Online review of the past 62-monthperiod conducted. Compliant with practice rules and regulations Last UDS on record: Summary  Date Value Ref Range Status  10/16/2016 FINAL  Final    Comment:    ==================================================================== [facility] COMP DRUG ANALYSIS,UR ==================================================================== Test                             Result       Flag       Units Drug Present and Declared for Prescription Verification   Oxycodone                      >3534        EXPECTED   ng/mg creat   Oxymorphone                    3447         EXPECTED   ng/mg creat   Noroxycodone                   >3534         EXPECTED   ng/mg creat   Noroxymorphone                 701          EXPECTED   ng/mg creat    Sources of oxycodone are scheduled prescription medications.    Oxymorphone, noroxycodone, and noroxymorphone are expected    metabolites of oxycodone. Oxymorphone is also available as a    scheduled prescription medication.   Gabapentin                     PRESENT      EXPECTED   Mirtazapine                    PRESENT      EXPECTED   Trazodone                      PRESENT      EXPECTED   1,3 chlorophenyl piperazine    PRESENT      EXPECTED    1,3-chlorophenyl piperazine is an expected metabolite of    trazodone.   Metoprolol                     PRESENT      EXPECTED Drug Present not Declared for Prescription Verification   Morphine  1194         UNEXPECTED ng/mg creat   Normorphine                    26           UNEXPECTED ng/mg creat    Potential sources of large amounts of morphine in the absence of    codeine include administration of morphine or use of heroin.    Normorphine is an expected metabolite of morphine. Drug Absent but Declared for Prescription Verification   Salicylate                     Not Detected UNEXPECTED    Aspirin, as indicated in the declared medication list, is not    always detected even when used as directed. ==================================================================== Test                      Result    Flag   Units      Ref Range   Creatinine              283              mg/dL      >=20 ==================================================================== Declared Medications:  The flagging and interpretation on this report are based on the  following declared medications.  Unexpected results may arise from  inaccuracies in the declared medications.  **Note: The testing scope of this panel includes these medications:  Gabapentin  Metoprolol  Mirtazapine  Oxycodone  Trazodone  **Note: The testing scope of this panel does not  include small to  moderate amounts of these reported medications:  Aspirin (Aspirin 81)  **Note: The testing scope of this panel does not include following  reported medications:  Atorvastatin  Cyanocobalamin  Docusate  Levothyroxine  Omeprazole ==================================================================== For clinical consultation, please call [phone]. ====================================================================    UDS interpretation: Compliant          Medication Assessment Form: Reviewed. Patient indicates being compliant with therapy Treatment compliance: Compliant Risk Assessment Profile: Aberrant behavior: See prior evaluations. None observed or detected today Comorbid factors increasing risk of overdose: See prior notes. No additional risks detected today Risk of substance use disorder (SUD): Low  ORT Scoring interpretation table:  Score <3 = Low Risk for SUD  Score between 4-7 = Moderate Risk for SUD  Score >8 = High Risk for Opioid Abuse   Risk Mitigation Strategies:  Patient Counseling: Covered Patient-Prescriber Agreement (PPA): Present and active  Notification to other healthcare providers: Done  Pharmacologic Plan: No change in therapy, at this time.             Assessment  Primary Diagnosis & Pertinent Problem List: The primary encounter diagnosis was Cancer-related pain. Diagnoses of Failed back surgical syndrome, Chronic low back pain (Primary Area of Pain) (Bilateral) (R>L), Chronic lower extremity pain (Secondary Area of Pain) (Bilateral) (R>L), Chronic pain syndrome, and Long term (current) use of opiate analgesic were also pertinent to this visit.  Status Diagnosis  Controlled Controlled Controlled 1. Cancer-related pain   2. Failed back surgical syndrome   3. Chronic low back pain (Primary Area of Pain) (Bilateral) (R>L)   4. Chronic lower extremity pain (Secondary Area of Pain) (Bilateral) (R>L)   5. Chronic pain syndrome   6.  Long term (current) use of opiate analgesic     Problems updated and reviewed during this visit: No problems updated. Plan of Care  Imaging Orders  No imaging studies ordered today   Procedure Orders    No procedure(s) ordered today   Interventional management options: Planned, scheduled, and/or pending:   We will inquire whether or not we can add some local anesthetic to his intrathecal pump.  He claims that that was attempted in the past, but that the insurance company did not approve it and they were billing him for the difference, which he could not afford and therefore he had that taken out of the pump.   Considering:   Diagnostic bilateral intra-articular hip joint injection  Therapeutic/palliative intrathecal pump refill(s)  Diagnostic bilateral lumbar facet block #1 Possible bilateral lumbar facet RFA Diagnostic caudal epidural steroid injection plus diagnostic epidurogram Possible Racz procedure Diagnostic left cervical epidural steroid injection Diagnosticleft cervical facet block Possible left cervical facet RFA   Palliative PRN treatment(s):   Diagnostic bilateral lumbar facet block #1under fluoroscopic guidance and IV sedation  Therapeutic/palliative intrathecal pump refill(s)    Medications ordered for procedure: Meds ordered this encounter  Medications  . Oxycodone HCl 10 MG TABS    Sig: Take 1 tablet (10 mg total) by mouth every 6 (six) hours as needed.    Dispense:  120 tablet    Refill:  0    Fill one day early if pharmacy is closed on scheduled refill date. Do not fill until: 07/22/17 To last until: 08/21/17  . Oxycodone HCl 10 MG TABS    Sig: Take 1 tablet (10 mg total) by mouth every 6 (six) hours as needed.    Dispense:  120 tablet    Refill:  0    Fill one day early if pharmacy is closed on scheduled refill date. Do not fill until: 08/21/17 To last until: 09/20/17   Medications administered: Sabas Sous had no medications  administered during this visit.  See the medical record for exact dosing, route, and time of administration.  New Prescriptions   No medications on file   Disposition: Discharge home  Discharge Date & Time: 06/18/2017; 1315 hrs.   Physician-requested Follow-up: Return for Pump Refill (as per pump program) (Max:51mo, w/ Dr. NDossie Arbour  Future Appointments  Date Time Provider [place] 06/20/2017 10:50 AM KDerek Jack MD [facility] None   Primary Care Physician: SManon Hilding MD [facility] Note by: FGaspar Cola MD Date: 06/18/2017; Time: 1:14 PM  Disclaimer:  Medicine is not an eChief Strategy Officer The only guarantee in medicine is that nothing is guaranteed. It is important to note that the decision to proceed with this intervention was based on the information collected from the patient. The Data and conclusions were drawn from the patient's questionnaire, the interview, and the physical examination. Because the information was provided in large part by the patient, it cannot be guaranteed that it has not been purposely or unconsciously manipulated. Every effort has been made to obtain as much relevant data as possible for this evaluation. It is important to note that the conclusions that lead to this procedure are derived in large part from the available data. Always take into account that the treatment will also be dependent on availability of resources and existing treatment guidelines, considered by other Pain Management Practitioners as being common knowledge and practice, at the time of the intervention. For Medico-Legal purposes, it is also important to point out that variation in procedural techniques and pharmacological choices are the acceptable norm. The indications, contraindications, technique, and results of the above procedure  should only be interpreted and judged by a Board-Certified Interventional Pain Specialist with  extensive familiarity and expertise in the same exact procedure and technique.

## 2017-06-18 ENCOUNTER — Ambulatory Visit: Payer: PPO | Attending: Pain Medicine | Admitting: Pain Medicine

## 2017-06-18 ENCOUNTER — Encounter: Payer: Self-pay | Admitting: Pain Medicine

## 2017-06-18 ENCOUNTER — Other Ambulatory Visit: Payer: Self-pay

## 2017-06-18 VITALS — BP 125/71 | HR 63 | Temp 97.9°F | Ht 69.0 in | Wt 160.0 lb

## 2017-06-18 DIAGNOSIS — G8929 Other chronic pain: Secondary | ICD-10-CM

## 2017-06-18 DIAGNOSIS — M79605 Pain in left leg: Secondary | ICD-10-CM | POA: Diagnosis not present

## 2017-06-18 DIAGNOSIS — G893 Neoplasm related pain (acute) (chronic): Secondary | ICD-10-CM

## 2017-06-18 DIAGNOSIS — M5441 Lumbago with sciatica, right side: Secondary | ICD-10-CM

## 2017-06-18 DIAGNOSIS — M79604 Pain in right leg: Secondary | ICD-10-CM | POA: Insufficient documentation

## 2017-06-18 DIAGNOSIS — Z79891 Long term (current) use of opiate analgesic: Secondary | ICD-10-CM

## 2017-06-18 DIAGNOSIS — Z981 Arthrodesis status: Secondary | ICD-10-CM | POA: Diagnosis not present

## 2017-06-18 DIAGNOSIS — G894 Chronic pain syndrome: Secondary | ICD-10-CM

## 2017-06-18 DIAGNOSIS — M5442 Lumbago with sciatica, left side: Secondary | ICD-10-CM

## 2017-06-18 DIAGNOSIS — M961 Postlaminectomy syndrome, not elsewhere classified: Secondary | ICD-10-CM

## 2017-06-18 MED ORDER — OXYCODONE HCL 10 MG PO TABS: 10.0000 mg | ORAL_TABLET | Freq: Four times a day (QID) | ORAL | 0 refills | Status: DC | PRN

## 2017-06-18 NOTE — Progress Notes (Signed)
Nursing Pain Medication Assessment:  Safety precautions to be maintained throughout the outpatient stay will include: orient to surroundings, keep bed in low position, maintain call bell within reach at all times, provide assistance with transfer out of bed and ambulation.  Medication Inspection Compliance: Pill count conducted under aseptic conditions, in front of the patient. Neither the pills nor the bottle was removed from the patient's sight at any time. Once count was completed pills were immediately returned to the patient in their original bottle.  Medication: Oxycodone IR Pill/Patch Count: 16 of 120 pills remain Pill/Patch Appearance: Markings consistent with prescribed medication Bottle Appearance: Standard pharmacy container. Clearly labeled. Filled Date: 4 / 64 / 2019 Last Medication intake:  Today

## 2017-06-18 NOTE — Patient Instructions (Addendum)
____________________________________________________________________________________________  Pain Scale  Introduction: The pain score used by this practice is the Verbal Numerical Rating Scale (VNRS-11). This is an 11-point scale. It is for adults and children 10 years or older. There are significant differences in how the pain score is reported, used, and applied. Forget everything you learned in the past and learn this scoring system.  General Information: The scale should reflect your current level of pain. Unless you are specifically asked for the level of your worst pain, or your average pain. If you are asked for one of these two, then it should be understood that it is over the past 24 hours.  Basic Activities of Daily Living (ADL): Personal hygiene, dressing, eating, transferring, and using restroom.  Instructions: Most patients tend to report their level of pain as a combination of two factors, their physical pain and their psychosocial pain. This last one is also known as "suffering" and it is reflection of how physical pain affects you socially and psychologically. From now on, report them separately. From this point on, when asked to report your pain level, report only your physical pain. Use the following table for reference.  Pain Clinic Pain Levels (0-5/10)  Pain Level Score  Description  No Pain 0   Mild pain 1 Nagging, annoying, but does not interfere with basic activities of daily living (ADL). Patients are able to eat, bathe, get dressed, toileting (being able to get on and off the toilet and perform personal hygiene functions), transfer (move in and out of bed or a chair without assistance), and maintain continence (able to control bladder and bowel functions). Blood pressure and heart rate are unaffected. A normal heart rate for a healthy adult ranges from 60 to 100 bpm (beats per minute).   Mild to moderate pain 2 Noticeable and distracting. Impossible to hide from other  people. More frequent flare-ups. Still possible to adapt and function close to normal. It can be very annoying and may have occasional stronger flare-ups. With discipline, patients may get used to it and adapt.   Moderate pain 3 Interferes significantly with activities of daily living (ADL). It becomes difficult to feed, bathe, get dressed, get on and off the toilet or to perform personal hygiene functions. Difficult to get in and out of bed or a chair without assistance. Very distracting. With effort, it can be ignored when deeply involved in activities.   Moderately severe pain 4 Impossible to ignore for more than a few minutes. With effort, patients may still be able to manage work or participate in some social activities. Very difficult to concentrate. Signs of autonomic nervous system discharge are evident: dilated pupils (mydriasis); mild sweating (diaphoresis); sleep interference. Heart rate becomes elevated (>115 bpm). Diastolic blood pressure (lower number) rises above 100 mmHg. Patients find relief in laying down and not moving.   Severe pain 5 Intense and extremely unpleasant. Associated with frowning face and frequent crying. Pain overwhelms the senses.  Ability to do any activity or maintain social relationships becomes significantly limited. Conversation becomes difficult. Pacing back and forth is common, as getting into a comfortable position is nearly impossible. Pain wakes you up from deep sleep. Physical signs will be obvious: pupillary dilation; increased sweating; goosebumps; brisk reflexes; cold, clammy hands and feet; nausea, vomiting or dry heaves; loss of appetite; significant sleep disturbance with inability to fall asleep or to remain asleep. When persistent, significant weight loss is observed due to the complete loss of appetite and sleep deprivation.  Blood   pressure and heart rate becomes significantly elevated. Caution: If elevated blood pressure triggers a pounding headache  associated with blurred vision, then the patient should immediately seek attention at an urgent or emergency care unit, as these may be signs of an impending stroke.    Emergency Department Pain Levels (6-10/10)  Emergency Room Pain 6 Severely limiting. Requires emergency care and should not be seen or managed at an outpatient pain management facility. Communication becomes difficult and requires great effort. Assistance to reach the emergency department may be required. Facial flushing and profuse sweating along with potentially dangerous increases in heart rate and blood pressure will be evident.   Distressing pain 7 Self-care is very difficult. Assistance is required to transport, or use restroom. Assistance to reach the emergency department will be required. Tasks requiring coordination, such as bathing and getting dressed become very difficult.   Disabling pain 8 Self-care is no longer possible. At this level, pain is disabling. The individual is unable to do even the most "basic" activities such as walking, eating, bathing, dressing, transferring to a bed, or toileting. Fine motor skills are lost. It is difficult to think clearly.   Incapacitating pain 9 Pain becomes incapacitating. Thought processing is no longer possible. Difficult to remember your own name. Control of movement and coordination are lost.   The worst pain imaginable 10 At this level, most patients pass out from pain. When this level is reached, collapse of the autonomic nervous system occurs, leading to a sudden drop in blood pressure and heart rate. This in turn results in a temporary and dramatic drop in blood flow to the brain, leading to a loss of consciousness. Fainting is one of the body's self defense mechanisms. Passing out puts the brain in a calmed state and causes it to shut down for a while, in order to begin the healing process.    Summary: 1. Refer to this scale when providing [place] with your pain level. 2. Be  accurate and careful when reporting your pain level. This will help with your care. 3. Over-reporting your pain level will lead to loss of credibility. 4. Even a level of 1/10 means that there is pain and will be treated at our facility. 5. High, inaccurate reporting will be documented as "Symptom Exaggeration", leading to loss of credibility and suspicions of possible secondary gains such as obtaining more narcotics, or wanting to appear disabled, for fraudulent reasons. 6. Only pain levels of 5 or below will be seen at our facility. 7. Pain levels of 6 and above will be sent to the Emergency Department and the appointment cancelled. ____________________________________________________________________________________________ Dennis Bast were given 2 prescriptions for Oxycodone today.

## 2017-06-20 ENCOUNTER — Other Ambulatory Visit: Payer: Self-pay

## 2017-06-20 ENCOUNTER — Encounter (HOSPITAL_COMMUNITY): Payer: Self-pay | Admitting: Hematology

## 2017-06-20 ENCOUNTER — Inpatient Hospital Stay (HOSPITAL_COMMUNITY): Payer: PPO

## 2017-06-20 ENCOUNTER — Inpatient Hospital Stay (HOSPITAL_COMMUNITY): Payer: PPO | Attending: Internal Medicine | Admitting: Hematology

## 2017-06-20 VITALS — BP 113/62 | HR 70 | Temp 97.9°F | Resp 18 | Wt 170.2 lb

## 2017-06-20 DIAGNOSIS — C3411 Malignant neoplasm of upper lobe, right bronchus or lung: Secondary | ICD-10-CM | POA: Diagnosis not present

## 2017-06-20 DIAGNOSIS — R1310 Dysphagia, unspecified: Secondary | ICD-10-CM

## 2017-06-20 DIAGNOSIS — I2510 Atherosclerotic heart disease of native coronary artery without angina pectoris: Secondary | ICD-10-CM | POA: Insufficient documentation

## 2017-06-20 DIAGNOSIS — Z79899 Other long term (current) drug therapy: Secondary | ICD-10-CM | POA: Insufficient documentation

## 2017-06-20 DIAGNOSIS — I700 Atherosclerosis of aorta: Secondary | ICD-10-CM | POA: Diagnosis not present

## 2017-06-20 DIAGNOSIS — I6522 Occlusion and stenosis of left carotid artery: Secondary | ICD-10-CM

## 2017-06-20 DIAGNOSIS — R5383 Other fatigue: Secondary | ICD-10-CM | POA: Insufficient documentation

## 2017-06-20 DIAGNOSIS — J438 Other emphysema: Secondary | ICD-10-CM

## 2017-06-20 DIAGNOSIS — C029 Malignant neoplasm of tongue, unspecified: Secondary | ICD-10-CM

## 2017-06-20 DIAGNOSIS — G8929 Other chronic pain: Secondary | ICD-10-CM | POA: Diagnosis not present

## 2017-06-20 DIAGNOSIS — F17210 Nicotine dependence, cigarettes, uncomplicated: Secondary | ICD-10-CM | POA: Diagnosis not present

## 2017-06-20 DIAGNOSIS — C3490 Malignant neoplasm of unspecified part of unspecified bronchus or lung: Secondary | ICD-10-CM

## 2017-06-20 LAB — COMPREHENSIVE METABOLIC PANEL
ALBUMIN: 4 g/dL (ref 3.5–5.0)
ALK PHOS: 38 U/L (ref 38–126)
ALT: 11 U/L — ABNORMAL LOW (ref 17–63)
ANION GAP: 7 (ref 5–15)
AST: 22 U/L (ref 15–41)
BILIRUBIN TOTAL: 0.6 mg/dL (ref 0.3–1.2)
BUN: 14 mg/dL (ref 6–20)
CO2: 28 mmol/L (ref 22–32)
Calcium: 9 mg/dL (ref 8.9–10.3)
Chloride: 101 mmol/L (ref 101–111)
Creatinine, Ser: 0.9 mg/dL (ref 0.61–1.24)
GFR calc Af Amer: >60 mL/min (ref >60)
GLUCOSE: 145 mg/dL — AB (ref 65–99)
Potassium: 4.1 mmol/L (ref 3.5–5.1)
Sodium: 136 mmol/L (ref 135–145)
TOTAL PROTEIN: 7 g/dL (ref 6.5–8.1)

## 2017-06-20 LAB — CBC WITH DIFFERENTIAL/PLATELET
BASOS PCT: 2 %
Basophils Absolute: 0.1 10*3/uL (ref 0.0–0.1)
Eosinophils Absolute: 0.1 10*3/uL (ref 0.0–0.7)
Eosinophils Relative: 2 %
HEMATOCRIT: 41.7 % (ref 39.0–52.0)
HEMOGLOBIN: 13.4 g/dL (ref 13.0–17.0)
LYMPHS ABS: 0.8 10*3/uL (ref 0.7–4.0)
LYMPHS PCT: 24 %
MCH: 29.9 pg (ref 26.0–34.0)
MCHC: 32.1 g/dL (ref 30.0–36.0)
MCV: 93.1 fL (ref 78.0–100.0)
MONO ABS: 0.5 10*3/uL (ref 0.1–1.0)
MONOS PCT: 14 %
NEUTROS ABS: 2.1 10*3/uL (ref 1.7–7.7)
NEUTROS PCT: 58 %
Platelets: 189 10*3/uL (ref 150–400)
RBC: 4.48 MIL/uL (ref 4.22–5.81)
RDW: 14.5 % (ref 11.5–15.5)
WBC: 3.5 10*3/uL — ABNORMAL LOW (ref 4.0–10.5)

## 2017-06-20 LAB — LACTATE DEHYDROGENASE: LDH: 145 U/L (ref 98–192)

## 2017-06-20 MED FILL — Medication: INTRATHECAL | Qty: 1 | Status: AC

## 2017-06-20 NOTE — Assessment & Plan Note (Signed)
1.  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 - Complains of dysphagia to all sorts of foods.  I have discussed the results of the PET CT scan which did not show any evidence of recurrence.  We have made an appointment for him to see Dr. Benjamine Mola for follow-up on examination. -Counseled to quit smoking.  Patient said that he has no intention to quit it.  2.  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  There is stable sub-5 mm right middle lobe and left lower lobe nodules.  Again we have counseled him to quit smoking.  I will arrange a repeat CT scan of the chest with contrast in 3 months to follow-up on the lesions.  We will also check a TSH at that time.

## 2017-06-20 NOTE — Progress Notes (Signed)
Patient Care Team: [name], MD as PCP - General (Cardiology) Leta Baptist, MD as Consulting Physician (Otolaryngology)  DIAGNOSIS:  Encounter Diagnoses  Name Primary?  . Malignant neoplasm of upper lobe of right lung ([place]) Yes  . Squamous cell carcinoma of tongue ([place])   . Other fatigue     SUMMARY OF ONCOLOGIC HISTORY:   Squamous cell carcinoma of tongue ([place])   03/14/2016 Miscellaneous    Dr. Benjamine Mola (ENT) consult      03/14/2016 Procedure    Flex laryngoscopy-base of tongue with ulcerative mass in the right side.      03/26/2016 Procedure    Biopsy of right tongue by  Dr. Benjamine Mola      03/27/2016 Pathology Results    Invasive squamous cell carcinoma.      04/02/2016 Miscellaneous    Pathology contacted and requested HPV testing on malignancy specimen.      04/09/2016 Imaging    CT soft tissue neck: IMPRESSION: Enhancing mass base of tongue on the right measures 25 x 27 mm compatible with carcinoma. No pathologic adenopathy in the neck.  Extensive carotid atherosclerotic disease. There is significant carotid stenosis on the left. Carotid duplex or CTA recommended for further evaluation.      04/09/2016 Imaging    CT chest w contrast: IMPRESSION: 1. Irregular spiculated 2.0 cm apical right upper lobe pulmonary nodule, new since 07/31/2006 PET-CT, morphologically suspicious for malignancy, which could represent a metastasis versus a synchronous primary bronchogenic carcinoma. 2. Separate tiny 3 mm solid pulmonary nodule in the right upper lobe, also new since 2008, indeterminate metastasis. 3. Mild right hilar lymphadenopathy, cannot exclude nodal metastasis. 4. Consider PET-CT for further staging evaluation. 5. Mild radiation fibrosis in the medial apical upper lobes bilaterally. 6. Additional findings include aortic atherosclerosis, 2 vessel coronary atherosclerosis and mild centrilobular emphysema.      05/03/2016 PET scan    Hypermetabolic mass (R) base of  tongue and lingular tonsil c/w H&N cancer recurrence. No evidence of metastatic adenopathy in neck. Hypermetabolic nodule in RUL lung measuring 15 mm concerning for pulmonary mets vs primary lung carcinoma (favor primary lung cancer). Additional small sub-5 mm pulmonary nodules are favored to be benign.       04/2016 Pathology Results    Focal staining for p16 positive in approx 5-10% of squamous cells.       05/20/2016 Pathology Results    RUL lung mass biopsy: surgical path squamous cell carcinoma       CHIEF COMPLIANT: Follow-up for squamous cell carcinoma of (R) tongue & Stage IA RUL lung cancer   INTERVAL HISTORY: Darrell White is a 67 y.o. male here for follow-up for tongue and lung cancers.   Endorses cough; this is chronic and largely unchanged per his report.  Denies any hemoptysis.  He continues to smoke cigarettes about 1 ppd.  Smoking cessation encouraged; he is not interested in quitting.  Endorses dysphagia since radiation; he has trouble swallowing solid foods.  He has not seen Dr. Benjamine Mola in quite some time; we will help facilitate return visit back to see him.   Reviewed recent scan results including MRI brain and PET scan.  MRI brain negative.  PET scan showed no significant change in 1.5 cm hypermetabolic nodule in (R) lung apex; stable sub-cm 5 mm RML and LLL pulmonary nodules, most likely benign. Recommend continued anntention on follow-up. No new or progressive sites of malignancy.   He has chronic pain; currently under care of Dr.  Naveira for his pain management.    Will plan to repeat CT chest in 3 months.    REVIEW OF SYSTEMS:   Constitutional: Denies fevers, chills or abnormal weight loss.  Positive for fatigue. Eyes: Denies blurriness of vision Ears, nose, mouth, throat, and face: Denies mucositis or sore throat Respiratory: Denies cough, dyspnea or wheezes Cardiovascular: Denies palpitation, chest discomfort Gastrointestinal:  Denies nausea, heartburn or  change in bowel habits.  Positive for trouble swallowing. Skin: Denies abnormal skin rashes Lymphatics: Denies new lymphadenopathy or easy bruising Neurological:Denies numbness, tingling or new weaknesses Behavioral/Psych: Mood is stable, no new changes  Extremities: No lower extremity edema All other systems were reviewed with the patient and are negative.  I have reviewed the past medical history, past surgical history, social history and family history with the patient and they are unchanged from previous note.  ALLERGIES:  has No Known Allergies.  MEDICATIONS:  Current Outpatient Medications  Medication Sig Dispense Refill  . atorvastatin (LIPITOR) 20 MG tablet Take 20 mg by mouth daily.     Mariane Baumgarten Calcium (STOOL SOFTENER PO) Take 1 tablet by mouth daily as needed (constipation).    . gabapentin (NEURONTIN) 300 MG capsule Take 600 mg by mouth daily.     Marland Kitchen levothyroxine (SYNTHROID, LEVOTHROID) 75 MCG tablet Take 75 mcg by mouth daily before breakfast.    . mirtazapine (REMERON) 45 MG tablet Take 45 mg by mouth at bedtime.    Marland Kitchen omeprazole (PRILOSEC) 20 MG capsule Take 20 mg by mouth daily as needed (heartburn).    Derrill Memo ON 06/22/2017] Oxycodone HCl 10 MG TABS Take 1 tablet (10 mg total) by mouth every 6 (six) hours as needed. 120 tablet 0  . [START ON 07/22/2017] Oxycodone HCl 10 MG TABS Take 1 tablet (10 mg total) by mouth every 6 (six) hours as needed. 120 tablet 0  . [START ON 08/21/2017] Oxycodone HCl 10 MG TABS Take 1 tablet (10 mg total) by mouth every 6 (six) hours as needed. 120 tablet 0  . traZODone (DESYREL) 50 MG tablet Take 200 mg by mouth at bedtime.      No current facility-administered medications for this visit.     PHYSICAL EXAMINATION: ECOG PERFORMANCE STATUS: 1 - Symptomatic but completely ambulatory I have reviewed his vitals. GENERAL:alert, no distress and comfortable SKIN: skin color, texture, turgor are normal, no rashes or significant lesions EYES:  normal, Conjunctiva are pink and non-injected, sclera clear OROPHARYNX:no mucositis, no erythema and lips, buccal mucosa, and tongue normal  NECK: supple, thyroid normal size, non-tender, without nodularity LYMPH:  no palpable lymphadenopathy in the cervical, axillary or inguinal  EXTREMITIES: No lower extremity edema   LABORATORY DATA:  I have reviewed the data as listed CMP Latest Ref Rng & Units 06/20/2017 06/17/2017 03/14/2017  Glucose 65 - 99 mg/dL 145(H) - -  BUN 6 - 20 mg/dL 14 - -  Creatinine 0.61 - 1.24 mg/dL 0.90 1.00 0.90  Sodium 135 - 145 mmol/L 136 - -  Potassium 3.5 - 5.1 mmol/L 4.1 - -  Chloride 101 - 111 mmol/L 101 - -  CO2 22 - 32 mmol/L 28 - -  Calcium 8.9 - 10.3 mg/dL 9.0 - -  Total Protein 6.5 - 8.1 g/dL 7.0 - -  Total Bilirubin 0.3 - 1.2 mg/dL 0.6 - -  Alkaline Phos 38 - 126 U/L 38 - -  AST 15 - 41 U/L 22 - -  ALT 17 - 63 U/L 11(L) - -  No results found for: HTD428   Lab Results  Component Value Date   WBC 3.5 (L) 06/20/2017   HGB 13.4 06/20/2017   HCT 41.7 06/20/2017   MCV 93.1 06/20/2017   PLT 189 06/20/2017   NEUTROABS 2.1 06/20/2017    ASSESSMENT & PLAN:  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 - Complains of dysphagia to all sorts of foods.  I have discussed the results of the PET CT scan which did not show any evidence of recurrence.  We have made an appointment for him to see Dr. Benjamine Mola for follow-up on examination. -Counseled to quit smoking.  Patient said that he has no intention to quit it.  2.  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  There is stable sub-5 mm right middle lobe and left lower lobe nodules.  Again we have  counseled him to quit smoking.  I will arrange a repeat CT scan of the chest with contrast in 3 months to follow-up on the lesions.  We will also check a TSH at that time.      Orders Placed This Encounter  Procedures  . CT Chest W Contrast    Standing Status:   Future    Standing Expiration Date:   06/20/2018    Order Specific Question:   If indicated for the ordered procedure, I authorize the administration of contrast media per Radiology protocol    Answer:   Yes    Order Specific Question:   Preferred imaging location?    Answer:   [facility]    Order Specific Question:   Radiology Contrast Protocol - do NOT remove file path    Answer:   [email or web address]    Order Specific Question:   Reason for Exam additional comments    Answer:   squamous cell carcinoma of tongue and lung; restaging  . Comprehensive metabolic panel    Standing Status:   Future    Standing Expiration Date:   06/21/2018    Order Specific Question:   Has the patient fasted?    Answer:   No  . CBC with Differential/Platelet    Standing Status:   Future    Standing Expiration Date:   06/21/2018  . TSH    Standing Status:   Future    Standing Expiration Date:   06/21/2018   The patient has a good understanding of the overall plan. he agrees with it. he will call with any problems that may develop before the next visit here.  This note includes documentation from Mike Craze, NP, who was present during this patient's office visit and evaluation.  I have reviewed this note for its completeness and accuracy.  I have edited this note accordingly based on my findings and medical opinion.      Derek Jack, MD 06/20/17

## 2017-06-20 NOTE — Patient Instructions (Signed)
[facility] at [facility] Discharge Instructions  Today you saw Dr. K.   Thank you for choosing [facility] at [facility] to provide your oncology and hematology care.  To afford each patient quality time with our provider, please arrive at least 15 minutes before your scheduled appointment time.   If you have a lab appointment with the Cancer Center please come in thru the  Main Entrance and check in at the main information desk  You need to re-schedule your appointment should you arrive 10 or more minutes late.  We strive to give you quality time with our providers, and arriving late affects you and other patients whose appointments are after yours.  Also, if you no show three or more times for appointments you may be dismissed from the clinic at the providers discretion.     Again, thank you for choosing [facility].  Our hope is that these requests will decrease the amount of time that you wait before being seen by our physicians.       _____________________________________________________________  Should you have questions after your visit to [facility], please contact our office at [phone] between the hours of 8:30 a.m. and 4:30 p.m.  Voicemails left after 4:30 p.m. will not be returned until the following business day.  For prescription refill requests, have your pharmacy contact our office.       Resources For Cancer Patients and their Caregivers ? American Cancer Society: Can assist with transportation, wigs, general needs, runs Look Good Feel Better.        1-888-227-6333 ? Cancer Care: Provides financial assistance, online support groups, medication/co-pay assistance.  1-800-813-HOPE (4673) ? [facility] Assists [place] cancer patients and their families through emotional , educational and financial support.  336-427-4357 ? [place] DSS Where to apply for food  stamps, Medicaid and utility assistance. 336-342-1394 ? [facility]: Transportation to medical appointments. 336-347-2287 ? Social Security Administration: May apply for disability if have a Stage IV cancer. 336-342-7796 1-800-772-1213 ? [place] Aging, Disability and Transit Services: Assists with nutrition, care and transit needs. 336-349-2343  Cancer Center Support Programs:   > Cancer Support Group  2nd Tuesday of the month 1pm-2pm, Journey Room   > Creative Journey  3rd Tuesday of the month 1130am-1pm, Journey Room    

## 2017-07-07 DIAGNOSIS — M79674 Pain in right toe(s): Secondary | ICD-10-CM | POA: Diagnosis not present

## 2017-07-07 DIAGNOSIS — M2041 Other hammer toe(s) (acquired), right foot: Secondary | ICD-10-CM | POA: Diagnosis not present

## 2017-07-07 DIAGNOSIS — L851 Acquired keratosis [keratoderma] palmaris et plantaris: Secondary | ICD-10-CM | POA: Diagnosis not present

## 2017-07-14 ENCOUNTER — Ambulatory Visit (INDEPENDENT_AMBULATORY_CARE_PROVIDER_SITE_OTHER): Payer: PPO | Admitting: Otolaryngology

## 2017-07-31 ENCOUNTER — Ambulatory Visit: Payer: PPO | Admitting: Pain Medicine

## 2017-08-14 ENCOUNTER — Other Ambulatory Visit: Payer: Self-pay

## 2017-08-14 MED ORDER — PAIN MANAGEMENT IT PUMP REFILL: 1.0000 | Freq: Once | INTRATHECAL | 0 refills | Status: AC

## 2017-09-09 ENCOUNTER — Ambulatory Visit: Payer: PPO | Attending: Pain Medicine | Admitting: Pain Medicine

## 2017-09-09 ENCOUNTER — Encounter: Payer: Self-pay | Admitting: Pain Medicine

## 2017-09-09 VITALS — BP 125/73 | HR 64 | Temp 97.7°F | Resp 16 | Ht 69.0 in | Wt 165.0 lb

## 2017-09-09 DIAGNOSIS — M79605 Pain in left leg: Secondary | ICD-10-CM

## 2017-09-09 DIAGNOSIS — G8929 Other chronic pain: Secondary | ICD-10-CM

## 2017-09-09 DIAGNOSIS — M961 Postlaminectomy syndrome, not elsewhere classified: Secondary | ICD-10-CM | POA: Insufficient documentation

## 2017-09-09 DIAGNOSIS — M5441 Lumbago with sciatica, right side: Secondary | ICD-10-CM | POA: Diagnosis not present

## 2017-09-09 DIAGNOSIS — Z451 Encounter for adjustment and management of infusion pump: Secondary | ICD-10-CM

## 2017-09-09 DIAGNOSIS — Z9889 Other specified postprocedural states: Secondary | ICD-10-CM | POA: Diagnosis not present

## 2017-09-09 DIAGNOSIS — M79604 Pain in right leg: Secondary | ICD-10-CM

## 2017-09-09 DIAGNOSIS — Z981 Arthrodesis status: Secondary | ICD-10-CM | POA: Insufficient documentation

## 2017-09-09 DIAGNOSIS — G894 Chronic pain syndrome: Secondary | ICD-10-CM | POA: Diagnosis not present

## 2017-09-09 DIAGNOSIS — M5442 Lumbago with sciatica, left side: Secondary | ICD-10-CM | POA: Insufficient documentation

## 2017-09-09 DIAGNOSIS — M542 Cervicalgia: Secondary | ICD-10-CM | POA: Diagnosis not present

## 2017-09-09 DIAGNOSIS — Z95828 Presence of other vascular implants and grafts: Secondary | ICD-10-CM | POA: Diagnosis not present

## 2017-09-09 DIAGNOSIS — G893 Neoplasm related pain (acute) (chronic): Secondary | ICD-10-CM | POA: Insufficient documentation

## 2017-09-09 MED ORDER — OXYCODONE HCL 10 MG PO TABS: 10.0000 mg | ORAL_TABLET | Freq: Four times a day (QID) | ORAL | 0 refills | Status: DC | PRN

## 2017-09-09 NOTE — Patient Instructions (Signed)
____________________________________________________________________________________________  Medication Rules  Applies to: All patients receiving prescriptions (written or electronic).  Pharmacy of record: Pharmacy where electronic prescriptions will be sent. If written prescriptions are taken to a different pharmacy, please inform the nursing staff. The pharmacy listed in the electronic medical record should be the one where you would like electronic prescriptions to be sent.  Prescription refills: Only during scheduled appointments. Applies to both, written and electronic prescriptions.  NOTE: The following applies primarily to controlled substances (Opioid* Pain Medications).   Patient's responsibilities: 1. Pain Pills: Bring all pain pills to every appointment (except for procedure appointments). 2. Pill Bottles: Bring pills in original pharmacy bottle. Always bring newest bottle. Bring bottle, even if empty. 3. Medication refills: You are responsible for knowing and keeping track of what medications you need refilled. The day before your appointment, write a list of all prescriptions that need to be refilled. Bring that list to your appointment and give it to the admitting nurse. Prescriptions will be written only during appointments. If you forget a medication, it will not be "Called in", "Faxed", or "electronically sent". You will need to get another appointment to get these prescribed. 4. Prescription Accuracy: You are responsible for carefully inspecting your prescriptions before leaving our office. Have the discharge nurse carefully go over each prescription with you, before taking them home. Make sure that your name is accurately spelled, that your address is correct. Check the name and dose of your medication to make sure it is accurate. Check the number of pills, and the written instructions to make sure they are clear and accurate. Make sure that you are given enough medication to last  until your next medication refill appointment. 5. Taking Medication: Take medication as prescribed. Never take more pills than instructed. Never take medication more frequently than prescribed. Taking less pills or less frequently is permitted and encouraged, when it comes to controlled substances (written prescriptions).  6. Inform other Doctors: Always inform, all of your healthcare providers, of all the medications you take. 7. Pain Medication from other Providers: You are not allowed to accept any additional pain medication from any other Doctor or Healthcare provider. There are two exceptions to this rule. (see below) In the event that you require additional pain medication, you are responsible for notifying us, as stated below. 8. Medication Agreement: You are responsible for carefully reading and following our Medication Agreement. This must be signed before receiving any prescriptions from our practice. Safely store a copy of your signed Agreement. Violations to the Agreement will result in no further prescriptions. (Additional copies of our Medication Agreement are available upon request.) 9. Laws, Rules, & Regulations: All patients are expected to follow all Federal and State Laws, Statutes, Rules, & Regulations. Ignorance of the Laws does not constitute a valid excuse. The use of any illegal substances is prohibited. 10. Adopted CDC guidelines & recommendations: Target dosing levels will be at or below 60 MME/day. Use of benzodiazepines** is not recommended.  Exceptions: There are only two exceptions to the rule of not receiving pain medications from other Healthcare Providers. 1. Exception #1 (Emergencies): In the event of an emergency (i.e.: accident requiring emergency care), you are allowed to receive additional pain medication. However, you are responsible for: As soon as you are able, call our office [phone], at any time of the day or night, and leave a message stating your name, the  date and nature of the emergency, and the name and dose of the medication   prescribed. In the event that your call is answered by a member of our staff, make sure to document and save the date, time, and the name of the person that took your information.  2. Exception #2 (Planned Surgery): In the event that you are scheduled by another doctor or dentist to have any type of surgery or procedure, you are allowed (for a period no longer than 30 days), to receive additional pain medication, for the acute post-op pain. However, in this case, you are responsible for picking up a copy of our "Post-op Pain Management for Surgeons" handout, and giving it to your surgeon or dentist. This document is available at our office, and does not require an appointment to obtain it. Simply go to our office during business hours (Monday-Thursday from 8:00 AM to 4:00 PM) (Friday 8:00 AM to 12:00 Noon) or if you have a scheduled appointment with us, prior to your surgery, and ask for it by name. In addition, you will need to provide us with your name, name of your surgeon, type of surgery, and date of procedure or surgery.  *Opioid medications include: morphine, codeine, oxycodone, oxymorphone, hydrocodone, hydromorphone, meperidine, tramadol, tapentadol, buprenorphine, fentanyl, methadone. **Benzodiazepine medications include: diazepam (Valium), alprazolam (Xanax), clonazepam (Klonopine), lorazepam (Ativan), clorazepate (Tranxene), chlordiazepoxide (Librium), estazolam (Prosom), oxazepam (Serax), temazepam (Restoril), triazolam (Halcion) (Last updated: 04/03/2017) ____________________________________________________________________________________________   ____________________________________________________________________________________________  Medication Recommendations and Reminders  Applies to: All patients receiving prescriptions (written and/or electronic).  Medication Rules & Regulations: These rules and  regulations exist for your safety and that of others. They are not flexible and neither are we. Dismissing or ignoring them will be considered "non-compliance" with medication therapy, resulting in complete and irreversible termination of such therapy. (See document titled "Medication Rules" for more details.) In all conscience, because of safety reasons, we cannot continue providing a therapy where the patient does not follow instructions.  Pharmacy of record:   Definition: This is the pharmacy where your electronic prescriptions will be sent.   We do not endorse any particular pharmacy.  You are not restricted in your choice of pharmacy.  The pharmacy listed in the electronic medical record should be the one where you want electronic prescriptions to be sent.  If you choose to change pharmacy, simply notify our nursing staff of your choice of new pharmacy.  Recommendations:  Keep all of your pain medications in a safe place, under lock and key, even if you live alone.   After you fill your prescription, take 1 week's worth of pills and put them away in a safe place. You should keep a separate, properly labeled bottle for this purpose. The remainder should be kept in the original bottle. Use this as your primary supply, until it runs out. Once it's gone, then you know that you have 1 week's worth of medicine, and it is time to come in for a prescription refill. If you do this correctly, it is unlikely that you will ever run out of medicine.  To make sure that the above recommendation works, it is very important that you make sure your medication refill appointments are scheduled at least 1 week before you run out of medicine. To do this in an effective manner, make sure that you do not leave the office without scheduling your next medication management appointment. Always ask the nursing staff to show you in your prescription , when your medication will be running out. Then arrange for the  receptionist to get you a return   appointment, at least 7 days before you run out of medicine. Do not wait until you have 1 or 2 pills left, to come in. This is very poor planning and does not take into consideration that we may need to cancel appointments due to bad weather, sickness, or emergencies affecting our staff.  "Partial Fill": If for any reason your pharmacy does not have enough pills/tablets to completely fill or refill your prescription, do not allow for a "partial fill". You will need a separate prescription to fill the remaining amount, which we will not provide. If the reason for the partial fill is your insurance, you will need to talk to the pharmacist about payment alternatives for the remaining tablets, but again, do not accept a partial fill.  Prescription refills and/or changes in medication(s):   Prescription refills, and/or changes in dose or medication, will be conducted only during scheduled medication management appointments. (Applies to both, written and electronic prescriptions.)  No refills on procedure days. No medication will be changed or started on procedure days. No changes, adjustments, and/or refills will be conducted on a procedure day. Doing so will interfere with the diagnostic portion of the procedure.  No phone refills. No medications will be "called into the pharmacy".  No Fax refills.  No weekend refills.  No Holliday refills.  No after hours refills.  Remember:  Business hours are:  Monday to Thursday 8:00 AM to 4:00 PM Provider's Schedule: Crystal King, NP - Appointments are:  Medication management: Monday to Thursday 8:00 AM to 4:00 PM Kelvon Giannini, MD - Appointments are:  Medication management: Monday and Wednesday 8:00 AM to 4:00 PM Procedure day: Tuesday and Thursday 7:30 AM to 4:00 PM Bilal Lateef, MD - Appointments are:  Medication management: Tuesday and Thursday 8:00 AM to 4:00 PM Procedure day: Monday and Wednesday 7:30 AM to  4:00 PM (Last update: 04/03/2017) ____________________________________________________________________________________________   ____________________________________________________________________________________________  CANNABIDIOL (AKA: CBD Oil or Pills)  Applies to: All patients receiving prescriptions of controlled substances (written and/or electronic).  General Information: Cannabidiol (CBD) was discovered in 1940. It is one of some 113 identified cannabinoids in cannabis (Marijuana) plants, accounting for up to 40% of the plant's extract. As of 2018, preliminary clinical research on cannabidiol included studies of anxiety, cognition, movement disorders, and pain.  Cannabidiol is consummed in multiple ways, including inhalation of cannabis smoke or vapor, as an aerosol spray into the cheek, and by mouth. It may be supplied as CBD oil containing CBD as the active ingredient (no added tetrahydrocannabinol (THC) or terpenes), a full-plant CBD-dominant hemp extract oil, capsules, dried cannabis, or as a liquid solution. CBD is thought not have the same psychoactivity as THC, and may affect the actions of THC. Studies suggest that CBD may interact with different biological targets, including cannabinoid receptors and other neurotransmitter receptors. As of 2018 the mechanism of action for its biological effects has not been determined.  In the [place], cannabidiol has a limited approval by the Food and Drug Administration (FDA) for treatment of only two types of epilepsy disorders. The side effects of long-term use of the drug include somnolence, decreased appetite, diarrhea, fatigue, malaise, weakness, sleeping problems, and others.  CBD remains a Schedule I drug prohibited for any use.  Legality: Some manufacturers ship CBD products nationally, an illegal action which the FDA has not enforced in 2018, with CBD remaining the subject of an FDA investigational new drug evaluation, and is  not considered legal as a dietary supplement or   food ingredient as of December 2018. Federal illegality has made it difficult historically to conduct research on CBD. CBD is openly sold in head shops and health food stores in some states where such sales have not been explicitly legalized.  Warning: Because it is not FDA approved for general use or treatment of pain, it is not required to undergo the same manufacturing controls as prescription drugs.  This means that the available cannabidiol (CBD) may be contaminated with THC.  If this is the case, it will trigger a positive urine drug screen (UDS) test for cannabinoids (Marijuana).  Because a positive UDS for illicit substances is a violation of our medication agreement, your opioid analgesics (pain medicine) may be permanently discontinued. (Last update: 04/24/2017) ____________________________________________________________________________________________

## 2017-09-09 NOTE — Progress Notes (Signed)
Patient's Name: Darrell White  MRN: [id number]  Referring Provider: [name], MD  DOB: Mar 12, 1950  PCP: [name], MD  DOS: 09/09/2017  Note by: Gaspar Cola, MD  Service setting: Ambulatory outpatient  Specialty: Interventional Pain Management  Patient type: Established  Location: [facility] (AMB) Pain Management Facility  Visit type: Interventional Procedure   Primary Reason for Visit: Interventional Pain Management Treatment. CC: Back Pain (lower) and Neck Pain  Procedure:          Intrathecal Drug Delivery System (IDDS):  Type: Reservoir Refill [phone]) No rate change Region: Abdominal Laterality: Right  Type of Pump: Medtronic Synchromed II (MRI-compatible) Delivery Route: Intrathecal Type of Pain Treated: Neuropathic/Nociceptive Primary Medication Class: Opioid/opiate  Medication, Concentration, Infusion Program, & Delivery Rate: Please see scanned programming printout.   Indications: 1. Chronic pain syndrome   2. Cancer-related pain   3. Failed back surgical syndrome   4. Chronic low back pain (Primary Area of Pain) (Bilateral) (R>L)   5. Chronic lower extremity pain (Secondary Area of Pain) (Bilateral) (R>L)   6. Presence of implanted infusion pump   7. Adjustment and management of infusion pump    Pain Assessment: Self-Reported Pain Score: 6 /10             Reported level is compatible with observation.        NOTE: Today we will be adding preservative free bupivacaine to the mixture. The hope is that this will provide him with better relief of the pain and we may be able to go down on his oral medications.  Intrathecal Pump Therapy Assessment  Manufacturer: Medtronic Synchromed II Type: Programmable Volume: 40 mL reservoir MRI compatibility: Yes   Drug content:  Primary Medication Class: Opioid Primary Medication: PF-Morphine 20 mg/mL Secondary Medication: PF-Bupivacaine  Other Medication: see pump readout   Programming:  Type: Simple continuous. See  pump readout for details.   Changes:  Medication Change: None at this point Rate Change: No change in rate  Reported side-effects or adverse reactions: None reported  Effectiveness: Described as relatively effective, allowing for increase in activities of daily living (ADL) Clinically meaningful improvement in function (CMIF): Sustained CMIF goals met  Plan: Pump refill today  Pre-op Assessment:  Darrell White is a 67 y.o. (year old), male patient, seen today for interventional treatment. He  has a past surgical history that includes Mouth surgery; Cervical disc surgery; Shoulder arthroscopy; Back surgery; Spinal fusion; Spine surgery; Knee arthroscopy; Cataract extraction w/PHACO (Left, 04/09/2012); Direct laryngoscopy (N/A, 03/26/2016); Excision of tongue lesion (Right, 03/26/2016); OTHER SURGICAL HISTORY; and CT LUNG SCREENING (06/16/2017). Darrell White has a current medication list which includes the following prescription(s): acetaminophen, docusate calcium, gabapentin, mirtazapine, omeprazole, trazodone, atorvastatin, levothyroxine, oxycodone hcl, oxycodone hcl, and oxycodone hcl. His primarily concern today is the Back Pain (lower) and Neck Pain  Initial Vital Signs:  Pulse/HCG Rate: 67  Temp: 97.7 F (36.5 C) Resp: 16 BP: 126/76 SpO2: 99 %  BMI: Estimated body mass index is 24.37 kg/m as calculated from the following:   Height as of this encounter: '5\' 9"'$  (1.753 m).   Weight as of this encounter: 165 lb (74.8 kg).  Risk Assessment: Allergies: Reviewed. He has No Known Allergies.  Allergy Precautions: None required Coagulopathies: Reviewed. None identified.  Blood-thinner therapy: None at this time Active Infection(s): Reviewed. None identified. Darrell White is afebrile  Site Confirmation: Darrell White was asked to confirm the procedure and laterality before marking the site Procedure checklist: Completed  Consent: Before the procedure and under the influence of no sedative(s), amnesic(s),  or anxiolytics, the patient was informed of the treatment options, risks and possible complications. To fulfill our ethical and legal obligations, as recommended by the American Medical Association's Code of Ethics, I have informed the patient of my clinical impression; the nature and purpose of the treatment or procedure; the risks, benefits, and possible complications of the intervention; the alternatives, including doing nothing; the risk(s) and benefit(s) of the alternative treatment(s) or procedure(s); and the risk(s) and benefit(s) of doing nothing.  Darrell White was provided with information about the general risks and possible complications associated with most interventional procedures. These include, but are not limited to: failure to achieve desired goals, infection, bleeding, organ or nerve damage, allergic reactions, paralysis, and/or death.  In addition, he was informed of those risks and possible complications associated to this particular procedure, which include, but are not limited to: damage to the implant; failure to decrease pain; local, systemic, or serious CNS infections, intraspinal abscess with possible cord compression and paralysis, or life-threatening such as meningitis; bleeding; organ damage; nerve injury or damage with subsequent sensory, motor, and/or autonomic system dysfunction, resulting in transient or permanent pain, numbness, and/or weakness of one or several areas of the body; allergic reactions, either minor or major life-threatening, such as anaphylactic or anaphylactoid reactions.  Furthermore, Darrell White was informed of those risks and complications associated with the medications. These include, but are not limited to: allergic reactions (i.e.: anaphylactic or anaphylactoid reactions); endorphine suppression; bradycardia and/or hypotension; water retention and/or peripheral vascular relaxation leading to lower extremity edema and possible stasis ulcers; respiratory  depression and/or shortness of breath; decreased metabolic rate leading to weight gain; swelling or edema; medication-induced neural toxicity; particulate matter embolism and blood vessel occlusion with resultant organ, and/or nervous system infarction; and/or intrathecal granuloma formation with possible spinal cord compression and permanent paralysis.  Before refilling the pump Mr. Recore was informed that some of the medications used in the devise may not be FDA approved for such use and therefore it constitutes an off-label use of the medications.  Finally, he was informed that Medicine is not an exact science; therefore, there is also the possibility of unforeseen or unpredictable risks and/or possible complications that may result in a catastrophic outcome. The patient indicated having understood very clearly. We have given the patient no guarantees and we have made no promises. Enough time was given to the patient to ask questions, all of which were answered to the patient's satisfaction. Mr. Radney has indicated that he wanted to continue with the procedure. Attestation: I, the ordering provider, attest that I have discussed with the patient the benefits, risks, side-effects, alternatives, likelihood of achieving goals, and potential problems during recovery for the procedure that I have provided informed consent. Date  Time: 09/09/2017 11:03 AM  Pre-Procedure Preparation:  Monitoring: As per clinic protocol. Respiration, ETCO2, SpO2, BP, heart rate and rhythm monitor placed and checked for adequate function Safety Precautions: Patient was assessed for positional comfort and pressure points before starting the procedure. Time-out: I initiated and conducted the "Time-out" before starting the procedure, as per protocol. The patient was asked to participate by confirming the accuracy of the "Time Out" information. Verification of the correct person, site, and procedure were performed and confirmed by  me, the nursing staff, and the patient. "Time-out" conducted as per Joint Commission's Universal Protocol (UP.01.01.01). Time:    Description of Procedure:  Position: Supine Target Area: Central-port of intrathecal pump. Approach: Anterior, 90 degree angle approach. Area Prepped: Entire Area around the pump implant. Prepping solution: ChloraPrep (2% chlorhexidine gluconate and 70% isopropyl alcohol) Safety Precautions: Aspiration looking for blood return was conducted prior to all injections. At no point did we inject any substances, as a needle was being advanced. No attempts were made at seeking any paresthesias. Safe injection practices and needle disposal techniques used. Medications properly checked for expiration dates. SDV (single dose vial) medications used. Description of the Procedure: Protocol guidelines were followed. Two nurses trained to do implant refills were present during the entire procedure. The refill medication was checked by both healthcare providers as well as the patient. The patient was included in the "Time-out" to verify the medication. The patient was placed in position. The pump was identified. The area was prepped in the usual manner. The sterile template was positioned over the pump, making sure the side-port location matched that of the pump. Both, the pump and the template were held for stability. The needle provided in the Medtronic Kit was then introduced thru the center of the template and into the central port. The pump content was aspirated and discarded volume documented. The new medication was slowly infused into the pump, thru the filter, making sure to avoid overpressure of the device. The needle was then removed and the area cleansed, making sure to leave some of the prepping solution back to take advantage of its long term bactericidal properties. The pump was interrogated and programmed to reflect the correct medication, volume, and dosage. The program  was printed and taken to the physician for approval. Once checked and signed by the physician, a copy was provided to the patient and another scanned into the EMR. Vitals:   09/09/17 1101 09/09/17 1145  BP: 126/76 125/73  Pulse: 67 64  Resp: 16 16  Temp: 97.7 F (36.5 C)   SpO2: 99%   Weight: 165 lb (74.8 kg)   Height: _0  (1.753 m)     Start Time:   hrs. End Time:   hrs. Materials & Medications: Medtronic Refill Kit Medication(s): Please see chart orders for details.  Imaging Guidance:          Type of Imaging Technique: None used Indication(s): N/A Exposure Time: No patient exposure Contrast: None used. Fluoroscopic Guidance: N/A Ultrasound Guidance: N/A Interpretation: N/A  Antibiotic Prophylaxis:   Anti-infectives (From admission, onward)   None     Indication(s): None identified  Post-operative Assessment:  Post-procedure Vital Signs:  Pulse/HCG Rate: 64  Temp: 97.7 F (36.5 C) Resp: 16 BP: 125/73 SpO2: 99 %  EBL: None  Complications: No immediate post-treatment complications observed by team, or reported by patient.  Note: The patient tolerated the entire procedure well. A repeat set of vitals were taken after the procedure and the patient was kept under observation following institutional policy, for this type of procedure. Post-procedural neurological assessment was performed, showing return to baseline, prior to discharge. The patient was provided with post-procedure discharge instructions, including a section on how to identify potential problems. Should any problems arise concerning this procedure, the patient was given instructions to immediately contact us, at any time, without hesitation. In any case, we plan to contact the patient by telephone for a follow-up status report regarding this interventional procedure.  Comments:  No additional relevant information.  Controlled Substance Pharmacotherapy Assessment REMS (Risk Evaluation and Mitigation  Strategy)  Analgesic: Oxycodone IR 10 mg every 6 hours (  40 mg/day oxycodone) MME/day: 60 mg/day.  Ignatius Specking, RN  09/09/2017 11:24 AM  Sign at close encounter Nursing Pain Medication Assessment:  Safety precautions to be maintained throughout the outpatient stay will include: orient to surroundings, keep bed in low position, maintain call bell within reach at all times, provide assistance with transfer out of bed and ambulation.  Medication Inspection Compliance: Pill count conducted under aseptic conditions, in front of the patient. Neither the pills nor the bottle was removed from the patient's sight at any time. Once count was completed pills were immediately returned to the patient in their original bottle.  Medication: See above Pill/Patch Count: 37 of 120 pills remain Pill/Patch Appearance: Markings consistent with prescribed medication Bottle Appearance: Standard pharmacy container. Clearly labeled. Filled Date: 7 / 4 / 2019 Last Medication intake:  Today   Pharmacokinetics: Liberation and absorption (onset of action): WNL Distribution (time to peak effect): WNL Metabolism and excretion (duration of action): WNL         Pharmacodynamics: Desired effects: Analgesia: Mr. Faucett reports >50% benefit. Functional ability: Patient reports that medication allows him to accomplish basic ADLs Clinically meaningful improvement in function (CMIF): Sustained CMIF goals met Perceived effectiveness: Described as relatively effective, allowing for increase in activities of daily living (ADL) Undesirable effects: Side-effects or Adverse reactions: None reported Monitoring: [place] PMP: Online review of the past 34-monthperiod conducted. Compliant with practice rules and regulations Last UDS on record: Summary  Date Value Ref Range Status  10/16/2016 FINAL  Final    Comment:    ==================================================================== [facility] COMP DRUG  ANALYSIS,UR ==================================================================== Test                             Result       Flag       Units Drug Present and Declared for Prescription Verification   Oxycodone                      >3534        EXPECTED   ng/mg creat   Oxymorphone                    3447         EXPECTED   ng/mg creat   Noroxycodone                   >3534        EXPECTED   ng/mg creat   Noroxymorphone                 701          EXPECTED   ng/mg creat    Sources of oxycodone are scheduled prescription medications.    Oxymorphone, noroxycodone, and noroxymorphone are expected    metabolites of oxycodone. Oxymorphone is also available as a    scheduled prescription medication.   Gabapentin                     PRESENT      EXPECTED   Mirtazapine                    PRESENT      EXPECTED   Trazodone                      PRESENT      EXPECTED   1,3 chlorophenyl piperazine  PRESENT      EXPECTED    1,3-chlorophenyl piperazine is an expected metabolite of    trazodone.   Metoprolol                     PRESENT      EXPECTED Drug Present not Declared for Prescription Verification   Morphine                       1194         UNEXPECTED ng/mg creat   Normorphine                    26           UNEXPECTED ng/mg creat    Potential sources of large amounts of morphine in the absence of    codeine include administration of morphine or use of heroin.    Normorphine is an expected metabolite of morphine. Drug Absent but Declared for Prescription Verification   Salicylate                     Not Detected UNEXPECTED    Aspirin, as indicated in the declared medication list, is not    always detected even when used as directed. ==================================================================== Test                      Result    Flag   Units      Ref Range   Creatinine              283              mg/dL       >=20 ==================================================================== Declared Medications:  The flagging and interpretation on this report are based on the  following declared medications.  Unexpected results may arise from  inaccuracies in the declared medications.  **Note: The testing scope of this panel includes these medications:  Gabapentin  Metoprolol  Mirtazapine  Oxycodone  Trazodone  **Note: The testing scope of this panel does not include small to  moderate amounts of these reported medications:  Aspirin (Aspirin 81)  **Note: The testing scope of this panel does not include following  reported medications:  Atorvastatin  Cyanocobalamin  Docusate  Levothyroxine  Omeprazole ==================================================================== For clinical consultation, please call [phone]. ====================================================================    UDS interpretation: Compliant          Medication Assessment Form: Reviewed. Patient indicates being compliant with therapy Treatment compliance: Compliant Risk Assessment Profile: Aberrant behavior: See prior evaluations. None observed or detected today Comorbid factors increasing risk of overdose: See prior notes. No additional risks detected today Risk of substance use disorder (SUD): Low  ORT Scoring interpretation table:  Score <3 = Low Risk for SUD  Score between 4-7 = Moderate Risk for SUD  Score >8 = High Risk for Opioid Abuse   Risk Mitigation Strategies:  Patient Counseling: Covered Patient-Prescriber Agreement (PPA): Present and active  Notification to other healthcare providers: Done  Pharmacologic Plan: No change in therapy, at this time.             Assessment  Primary Diagnosis & Pertinent Problem List: The primary encounter diagnosis was Chronic pain syndrome. Diagnoses of Cancer-related pain, Failed back surgical syndrome, Chronic low back pain (Primary Area of Pain)  (Bilateral) (R>L), Chronic lower extremity pain (Secondary Area of Pain) (Bilateral) (R>L), Presence of implanted infusion pump, and Adjustment and management  of infusion pump were also pertinent to this visit.  Status Diagnosis  Controlled Controlled Controlled 1. Chronic pain syndrome   2. Cancer-related pain   3. Failed back surgical syndrome   4. Chronic low back pain (Primary Area of Pain) (Bilateral) (R>L)   5. Chronic lower extremity pain (Secondary Area of Pain) (Bilateral) (R>L)   6. Presence of implanted infusion pump   7. Adjustment and management of infusion pump     Problems updated and reviewed during this visit: No problems updated. Plan of Care   Imaging Orders  No imaging studies ordered today    Procedure Orders     PUMP REFILL     PUMP REFILL  Medications ordered for procedure: Meds ordered this encounter  Medications  . Oxycodone HCl 10 MG TABS    Sig: Take 1 tablet (10 mg total) by mouth every 6 (six) hours as needed.    Dispense:  120 tablet    Refill:  0    Medication for Chronic Pain (G89.4). [place] STOP ACT - Not applicable. Fill one day early if pharmacy is closed on scheduled refill date.  Do not fill until: 11/19/17 To last until: 12/19/17  . Oxycodone HCl 10 MG TABS    Sig: Take 1 tablet (10 mg total) by mouth every 6 (six) hours as needed.    Dispense:  120 tablet    Refill:  0    Medication for Chronic Pain (G89.4). [place] STOP ACT - Not applicable. Fill one day early if pharmacy is closed on scheduled refill date.  Do not fill until: 10/20/17 To last until: 11/19/17  . Oxycodone HCl 10 MG TABS    Sig: Take 1 tablet (10 mg total) by mouth every 6 (six) hours as needed.    Dispense:  120 tablet    Refill:  0    Medication for Chronic Pain (G89.4). [place] STOP ACT - Not applicable. Fill one day early if pharmacy is closed on scheduled refill date.  Do not fill until: 09/20/17 To last until: 10/20/17   Medications administered: Sabas Sous  had no medications administered during this visit.  See the medical record for exact dosing, route, and time of administration.  New Prescriptions   No medications on file   Disposition: Discharge home  Discharge Date & Time: 09/09/2017;   hrs.   Physician-requested Follow-up: Return for Pump Refill (as per pump program) (Max:66mo, Med-Mgmt, w/ Dr. NDossie Arbour  Future Appointments  Date Time [place] 09/18/2017  9:10 AM [facility] LAB [facility] None  09/18/2017 10:00 AM AP-CT 1 AP-CT [place] [facility]  09/22/2017  3:00 PM KDerek Jack MD AP-AC[facility]A None   Primary Care Physician: SManon Hilding [facility] Note by: FGaspar Cola MD Date: 09/09/2017; Time: 12:14 PM  Disclaimer:  Medicine is not an exact science. The only guarantee in medicine is that nothing is guaranteed. It is important to note that the decision to proceed with this intervention was based on the information collected from the patient. The Data and conclusions were drawn from the patient's questionnaire, the interview, and the physical examination. Because the information was provided in large part by the patient, it cannot be guaranteed that it has not been purposely or unconsciously manipulated. Every effort has been made to obtain as much relevant data as possible for this evaluation. It is important to note that the conclusions that lead to this procedure are derived in large part from  the available data. Always take into account that the treatment will also be dependent on availability of resources and existing treatment guidelines, considered by other Pain Management Practitioners as being common knowledge and practice, at the time of the intervention. For Medico-Legal purposes, it is also important to point out that variation in procedural techniques and pharmacological choices are the acceptable norm. The indications, contraindications, technique, and results of  the above procedure should only be interpreted and judged by a Board-Certified Interventional Pain Specialist with extensive familiarity and expertise in the same exact procedure and technique.

## 2017-09-09 NOTE — Progress Notes (Signed)
Nursing Pain Medication Assessment:  Safety precautions to be maintained throughout the outpatient stay will include: orient to surroundings, keep bed in low position, maintain call bell within reach at all times, provide assistance with transfer out of bed and ambulation.  Medication Inspection Compliance: Pill count conducted under aseptic conditions, in front of the patient. Neither the pills nor the bottle was removed from the patient's sight at any time. Once count was completed pills were immediately returned to the patient in their original bottle.  Medication: See above Pill/Patch Count: 37 of 120 pills remain Pill/Patch Appearance: Markings consistent with prescribed medication Bottle Appearance: Standard pharmacy container. Clearly labeled. Filled Date: 7 / 56 / 2019 Last Medication intake:  Today

## 2017-09-10 ENCOUNTER — Telehealth: Payer: Self-pay

## 2017-09-10 NOTE — Telephone Encounter (Signed)
Post Pump refill.  LM

## 2017-09-12 DIAGNOSIS — M542 Cervicalgia: Secondary | ICD-10-CM | POA: Diagnosis not present

## 2017-09-12 DIAGNOSIS — Z6823 Body mass index (BMI) 23.0-23.9, adult: Secondary | ICD-10-CM | POA: Diagnosis not present

## 2017-09-12 DIAGNOSIS — M545 Low back pain: Secondary | ICD-10-CM | POA: Diagnosis not present

## 2017-09-17 MED FILL — Medication: INTRATHECAL | Qty: 1 | Status: AC

## 2017-09-18 ENCOUNTER — Ambulatory Visit (HOSPITAL_COMMUNITY)
Admission: RE | Admit: 2017-09-18 | Discharge: 2017-09-18 | Disposition: A | Discharge status: Home / Self Care | Payer: PPO | Source: Ambulatory Visit | Attending: Hematology | Admitting: Hematology

## 2017-09-18 ENCOUNTER — Inpatient Hospital Stay (HOSPITAL_COMMUNITY): Payer: PPO | Attending: Hematology

## 2017-09-18 DIAGNOSIS — K219 Gastro-esophageal reflux disease without esophagitis: Secondary | ICD-10-CM | POA: Insufficient documentation

## 2017-09-18 DIAGNOSIS — R1310 Dysphagia, unspecified: Secondary | ICD-10-CM | POA: Insufficient documentation

## 2017-09-18 DIAGNOSIS — Z923 Personal history of irradiation: Secondary | ICD-10-CM | POA: Diagnosis not present

## 2017-09-18 DIAGNOSIS — M129 Arthropathy, unspecified: Secondary | ICD-10-CM | POA: Diagnosis not present

## 2017-09-18 DIAGNOSIS — R918 Other nonspecific abnormal finding of lung field: Secondary | ICD-10-CM | POA: Diagnosis not present

## 2017-09-18 DIAGNOSIS — G629 Polyneuropathy, unspecified: Secondary | ICD-10-CM | POA: Diagnosis not present

## 2017-09-18 DIAGNOSIS — F418 Other specified anxiety disorders: Secondary | ICD-10-CM | POA: Diagnosis not present

## 2017-09-18 DIAGNOSIS — E7800 Pure hypercholesterolemia, unspecified: Secondary | ICD-10-CM | POA: Diagnosis not present

## 2017-09-18 DIAGNOSIS — R5383 Other fatigue: Secondary | ICD-10-CM

## 2017-09-18 DIAGNOSIS — I700 Atherosclerosis of aorta: Secondary | ICD-10-CM | POA: Insufficient documentation

## 2017-09-18 DIAGNOSIS — Z79899 Other long term (current) drug therapy: Secondary | ICD-10-CM | POA: Diagnosis not present

## 2017-09-18 DIAGNOSIS — C029 Malignant neoplasm of tongue, unspecified: Secondary | ICD-10-CM | POA: Diagnosis not present

## 2017-09-18 DIAGNOSIS — I2584 Coronary atherosclerosis due to calcified coronary lesion: Secondary | ICD-10-CM | POA: Diagnosis not present

## 2017-09-18 DIAGNOSIS — E039 Hypothyroidism, unspecified: Secondary | ICD-10-CM | POA: Insufficient documentation

## 2017-09-18 DIAGNOSIS — J439 Emphysema, unspecified: Secondary | ICD-10-CM | POA: Diagnosis not present

## 2017-09-18 DIAGNOSIS — F17210 Nicotine dependence, cigarettes, uncomplicated: Secondary | ICD-10-CM | POA: Insufficient documentation

## 2017-09-18 DIAGNOSIS — Y842 Radiological procedure and radiotherapy as the cause of abnormal reaction of the patient, or of later complication, without mention of misadventure at the time of the procedure: Secondary | ICD-10-CM | POA: Insufficient documentation

## 2017-09-18 DIAGNOSIS — R911 Solitary pulmonary nodule: Secondary | ICD-10-CM | POA: Diagnosis not present

## 2017-09-18 DIAGNOSIS — I10 Essential (primary) hypertension: Secondary | ICD-10-CM | POA: Insufficient documentation

## 2017-09-18 DIAGNOSIS — I6522 Occlusion and stenosis of left carotid artery: Secondary | ICD-10-CM | POA: Insufficient documentation

## 2017-09-18 DIAGNOSIS — R599 Enlarged lymph nodes, unspecified: Secondary | ICD-10-CM | POA: Diagnosis not present

## 2017-09-18 DIAGNOSIS — I2510 Atherosclerotic heart disease of native coronary artery without angina pectoris: Secondary | ICD-10-CM | POA: Insufficient documentation

## 2017-09-18 DIAGNOSIS — C3411 Malignant neoplasm of upper lobe, right bronchus or lung: Secondary | ICD-10-CM | POA: Insufficient documentation

## 2017-09-18 LAB — COMPREHENSIVE METABOLIC PANEL
ALT: 12 U/L (ref 0–44)
AST: 20 U/L (ref 15–41)
Albumin: 4.1 g/dL (ref 3.5–5.0)
Alkaline Phosphatase: 42 U/L (ref 38–126)
Anion gap: 8 (ref 5–15)
BILIRUBIN TOTAL: 0.7 mg/dL (ref 0.3–1.2)
BUN: 17 mg/dL (ref 8–23)
CALCIUM: 9.2 mg/dL (ref 8.9–10.3)
CHLORIDE: 103 mmol/L (ref 98–111)
CO2: 28 mmol/L (ref 22–32)
CREATININE: 0.95 mg/dL (ref 0.61–1.24)
Glucose, Bld: 112 mg/dL — ABNORMAL HIGH (ref 70–99)
Potassium: 4.5 mmol/L (ref 3.5–5.1)
Sodium: 139 mmol/L (ref 135–145)
Total Protein: 7 g/dL (ref 6.5–8.1)

## 2017-09-18 LAB — CBC WITH DIFFERENTIAL/PLATELET
Basophils Absolute: 0 10*3/uL (ref 0.0–0.1)
Basophils Relative: 1 %
EOS PCT: 3 %
Eosinophils Absolute: 0.1 10*3/uL (ref 0.0–0.7)
HEMATOCRIT: 42.8 % (ref 39.0–52.0)
Hemoglobin: 14.2 g/dL (ref 13.0–17.0)
LYMPHS PCT: 20 %
Lymphs Abs: 0.8 10*3/uL (ref 0.7–4.0)
MCH: 30.8 pg (ref 26.0–34.0)
MCHC: 33.2 g/dL (ref 30.0–36.0)
MCV: 92.8 fL (ref 78.0–100.0)
MONOS PCT: 12 %
Monocytes Absolute: 0.4 10*3/uL (ref 0.1–1.0)
NEUTROS ABS: 2.4 10*3/uL (ref 1.7–7.7)
Neutrophils Relative %: 64 %
PLATELETS: 166 10*3/uL (ref 150–400)
RBC: 4.61 MIL/uL (ref 4.22–5.81)
RDW: 14.3 % (ref 11.5–15.5)
WBC: 3.8 10*3/uL — ABNORMAL LOW (ref 4.0–10.5)

## 2017-09-18 LAB — TSH: TSH: 19.234 u[IU]/mL — ABNORMAL HIGH (ref 0.350–4.500)

## 2017-09-18 MED ORDER — IOHEXOL 300 MG/ML  SOLN
75.0000 mL | Freq: Once | INTRAMUSCULAR | Status: AC | PRN
  Administered 2017-09-18: 75 mL via INTRAVENOUS

## 2017-09-22 ENCOUNTER — Inpatient Hospital Stay (HOSPITAL_COMMUNITY): Payer: PPO | Attending: Hematology | Admitting: Hematology

## 2017-09-22 ENCOUNTER — Encounter (HOSPITAL_COMMUNITY): Payer: Self-pay | Admitting: Hematology

## 2017-09-22 VITALS — BP 107/54 | HR 80 | Temp 98.4°F | Resp 18 | Ht 69.0 in | Wt 164.4 lb

## 2017-09-22 DIAGNOSIS — F418 Other specified anxiety disorders: Secondary | ICD-10-CM | POA: Diagnosis not present

## 2017-09-22 DIAGNOSIS — Z9114 Patient's other noncompliance with medication regimen: Secondary | ICD-10-CM | POA: Insufficient documentation

## 2017-09-22 DIAGNOSIS — I2510 Atherosclerotic heart disease of native coronary artery without angina pectoris: Secondary | ICD-10-CM | POA: Diagnosis not present

## 2017-09-22 DIAGNOSIS — K5900 Constipation, unspecified: Secondary | ICD-10-CM | POA: Diagnosis not present

## 2017-09-22 DIAGNOSIS — K219 Gastro-esophageal reflux disease without esophagitis: Secondary | ICD-10-CM | POA: Insufficient documentation

## 2017-09-22 DIAGNOSIS — Z85810 Personal history of malignant neoplasm of tongue: Secondary | ICD-10-CM | POA: Insufficient documentation

## 2017-09-22 DIAGNOSIS — J439 Emphysema, unspecified: Secondary | ICD-10-CM

## 2017-09-22 DIAGNOSIS — Z79899 Other long term (current) drug therapy: Secondary | ICD-10-CM | POA: Diagnosis not present

## 2017-09-22 DIAGNOSIS — C3411 Malignant neoplasm of upper lobe, right bronchus or lung: Secondary | ICD-10-CM

## 2017-09-22 DIAGNOSIS — R1310 Dysphagia, unspecified: Secondary | ICD-10-CM

## 2017-09-22 DIAGNOSIS — R5383 Other fatigue: Secondary | ICD-10-CM | POA: Insufficient documentation

## 2017-09-22 DIAGNOSIS — R599 Enlarged lymph nodes, unspecified: Secondary | ICD-10-CM | POA: Diagnosis not present

## 2017-09-22 DIAGNOSIS — I6522 Occlusion and stenosis of left carotid artery: Secondary | ICD-10-CM | POA: Diagnosis not present

## 2017-09-22 DIAGNOSIS — E039 Hypothyroidism, unspecified: Secondary | ICD-10-CM | POA: Diagnosis not present

## 2017-09-22 DIAGNOSIS — M129 Arthropathy, unspecified: Secondary | ICD-10-CM

## 2017-09-22 DIAGNOSIS — E7800 Pure hypercholesterolemia, unspecified: Secondary | ICD-10-CM | POA: Diagnosis not present

## 2017-09-22 DIAGNOSIS — G629 Polyneuropathy, unspecified: Secondary | ICD-10-CM | POA: Diagnosis not present

## 2017-09-22 DIAGNOSIS — R918 Other nonspecific abnormal finding of lung field: Secondary | ICD-10-CM | POA: Diagnosis not present

## 2017-09-22 DIAGNOSIS — I700 Atherosclerosis of aorta: Secondary | ICD-10-CM

## 2017-09-22 DIAGNOSIS — I10 Essential (primary) hypertension: Secondary | ICD-10-CM | POA: Insufficient documentation

## 2017-09-22 DIAGNOSIS — F17210 Nicotine dependence, cigarettes, uncomplicated: Secondary | ICD-10-CM | POA: Insufficient documentation

## 2017-09-22 DIAGNOSIS — C029 Malignant neoplasm of tongue, unspecified: Secondary | ICD-10-CM

## 2017-09-22 DIAGNOSIS — Z923 Personal history of irradiation: Secondary | ICD-10-CM

## 2017-09-22 NOTE — Progress Notes (Signed)
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 Benjamine Mola (ENT) consult    03/14/2016 Procedure    Flex laryngoscopy-base of tongue with ulcerative mass in the right side.    03/26/2016 Procedure    Biopsy of right tongue by  Dr. Benjamine Mola    03/27/2016 Pathology Results    Invasive squamous cell carcinoma.    04/02/2016 Miscellaneous    Pathology contacted and requested HPV testing on malignancy specimen.    04/09/2016 Imaging    CT soft tissue neck: IMPRESSION: Enhancing mass base of tongue on the right measures 25 x 27 mm compatible with carcinoma. No pathologic adenopathy in the neck.  Extensive carotid atherosclerotic disease. There is significant carotid stenosis on the left. Carotid duplex or CTA recommended for further evaluation.    04/09/2016 Imaging    CT chest w contrast: IMPRESSION: 1. Irregular spiculated 2.0 cm apical right upper lobe pulmonary nodule, new since 07/31/2006 PET-CT, morphologically suspicious for malignancy, which could represent a metastasis versus a synchronous primary bronchogenic carcinoma. 2. Separate tiny 3 mm solid pulmonary nodule in the right upper lobe, also new since 2008, indeterminate metastasis. 3. Mild right hilar lymphadenopathy, cannot exclude nodal metastasis. 4. Consider PET-CT for further staging evaluation. 5. Mild radiation fibrosis in the medial apical upper lobes bilaterally. 6. Additional findings include aortic atherosclerosis, 2 vessel coronary atherosclerosis and mild centrilobular emphysema.    05/03/2016 PET scan    Hypermetabolic mass (R) base of tongue and lingular tonsil c/w H&N  cancer recurrence. No evidence of metastatic adenopathy in neck. Hypermetabolic nodule in RUL lung measuring 15 mm concerning for pulmonary mets vs primary lung carcinoma (favor primary lung cancer). Additional small sub-5 mm pulmonary nodules are favored to be benign.     04/2016 Pathology Results    Focal staining for p16 positive in approx 5-10% of squamous cells.     05/20/2016 Pathology Results    RUL lung mass biopsy: surgical path squamous cell carcinoma      CANCER STAGING: Cancer Staging Primary cancer of right upper lobe of lung ([facility]) Staging form: Lung, AJCC 7th Edition - Clinical stage from 06/05/2016: Stage IA (T1a, N0, M0) - Signed by [name], MD on 06/05/2016    INTERVAL HISTORY:  Mr. Bowker 67 y.o. male returns for routine follow-up for squamous cell carcinoma of the tongue. Patient is here today doing well since his last visit. He is still smoking a pack per day of cigarettes. Patient has also stopped taking his thyroid medication about 2 months ago. He has no reason or bad side effects he just stopped taking it. He will start this back today and follow up with his primary care doctor in 2 months to recheck labs. Patient complains of constipation and fatigue during the day. Patient lives at home alone and performs all his own ADLs and daily activities. Patient reports his appetite at 50%. His energy level is 25%.     REVIEW OF SYSTEMS:  Review of Systems  Constitutional: Positive for fatigue.  Gastrointestinal: Positive for constipation.  Neurological: Positive for dizziness.  All other systems reviewed and are negative.  PAST MEDICAL/SURGICAL HISTORY:  Past Medical History:  Diagnosis Date  . Anxiety   . Arthritis   . Bronchitis 03/25/2017  . Cancer ([facility])    right base of tongue  . Depression   . GERD (gastroesophageal reflux disease)   . HOH (hard of hearing)   . Hypercholesteremia   . Hypertension   . Hypothyroid   . Lung cancer ([facility])    right  upper lobe non small cell lung cancer  . Neuropathy   . Squamous cell carcinoma of tongue ([facility]) 04/02/2016   Past Surgical History:  Procedure Laterality Date  . BACK SURGERY     had total of 7 surgeries with rods and plates  . CATARACT EXTRACTION W/PHACO Left 04/09/2012   Procedure: CATARACT EXTRACTION PHACO AND INTRAOCULAR LENS PLACEMENT ([facility]);  Surgeon: Tonny Branch, MD;  Location: [facility];  Service: Ophthalmology;  Laterality: Left;  CDE: 12.44  . CERVICAL DISC SURGERY    . CT LUNG SCREENING  06/16/2017  . DIRECT LARYNGOSCOPY N/A 03/26/2016   Procedure: DIRECT LARYNGOSCOPY;  Surgeon: Leta Baptist, MD;  Location: [facility];  Service: ENT;  Laterality: N/A;  . EXCISION OF TONGUE LESION Right 03/26/2016   Procedure: BIOPSY OF RIGHT TONGUE BASE MASS;  Surgeon: Leta Baptist, MD;  Location: [facility];  Service: ENT;  Laterality: Right;  . KNEE ARTHROSCOPY     right knee  . MOUTH SURGERY     removal of cancer  . OTHER SURGICAL HISTORY     insertion of pain pump  . SHOULDER ARTHROSCOPY     bilateral  . SPINAL FUSION    . SPINE SURGERY     insertion of morphine pump into spine     SOCIAL HISTORY:  Social History   Socioeconomic History  . Marital status: Married    Spouse name: Not on file  . Number of children: Not on file  . Years of education: Not on file  . Highest education level: Not on file  Occupational History  . Not on file  Social Needs  . Financial resource strain: Not on file  . Food insecurity:    Worry: Not on file    Inability: Not on file  . Transportation needs:    Medical: Not on file    Non-medical: Not on file  Tobacco Use  . Smoking status: Current Every Day Smoker    Packs/day: 1.00    Years: 55.00    Pack years: 55.00    Types: Cigarettes  . Smokeless tobacco: Never Used  Substance and Sexual Activity  . Alcohol use: No    Comment: H/O EtOHism drinking heavy- boubon/beer, quit ~ 2000  . Drug use: No  . Sexual activity: Yes      Birth control/protection: None  Lifestyle  . Physical activity:    Days per week: Not on file    Minutes per session: Not on file  . Stress: Not on file  Relationships  . Social connections:    Talks on phone: Not on file    Gets together: Not on file    Attends religious service: Not on file    Active member of club or organization: Not on file    Attends meetings of clubs or organizations: Not on file    Relationship status: Not on file  . Intimate partner violence:    Fear of current or ex partner: Not on file    Emotionally abused: Not on file    Physically  abused: Not on file    Forced sexual activity: Not on file  Other Topics Concern  . Not on file  Social History Narrative  . Not on file    FAMILY HISTORY:  Family History  Problem Relation Age of Onset  . Cancer Father   . Cancer Brother        Prostate cancer    CURRENT MEDICATIONS:  Outpatient Encounter Medications as of 09/22/2017  Medication Sig  . acetaminophen (TYLENOL) 325 MG tablet Take 650 mg by mouth every 6 (six) hours as needed for mild pain.  Marland Kitchen atorvastatin (LIPITOR) 20 MG tablet Take 20 mg by mouth daily.   Mariane Baumgarten Calcium (STOOL SOFTENER PO) Take 1 tablet by mouth daily as needed (constipation).  . gabapentin (NEURONTIN) 300 MG capsule Take 600 mg by mouth daily.   Marland Kitchen levothyroxine (SYNTHROID, LEVOTHROID) 75 MCG tablet Take 75 mcg by mouth daily before breakfast.  . mirtazapine (REMERON) 45 MG tablet Take 45 mg by mouth at bedtime.  Marland Kitchen omeprazole (PRILOSEC) 20 MG capsule Take 20 mg by mouth daily as needed (heartburn).  Derrill Memo ON 11/19/2017] Oxycodone HCl 10 MG TABS Take 1 tablet (10 mg total) by mouth every 6 (six) hours as needed.  Derrill Memo ON 10/20/2017] Oxycodone HCl 10 MG TABS Take 1 tablet (10 mg total) by mouth every 6 (six) hours as needed.  . Oxycodone HCl 10 MG TABS Take 1 tablet (10 mg total) by mouth every 6 (six) hours as needed.  . traZODone (DESYREL) 50 MG tablet Take 200 mg by  mouth at bedtime.    No facility-administered encounter medications on file as of 09/22/2017.     ALLERGIES:  No Known Allergies   PHYSICAL EXAM:  ECOG Performance status: 1  Vitals:   09/22/17 1500  BP: (!) 107/54  Pulse: 80  Resp: 18  Temp: 98.4 F (36.9 C)  SpO2: 99%   Filed Weights   09/22/17 1500  Weight: 164 lb 6 oz (74.6 kg)    Physical Exam  Constitutional: He is oriented to person, place, and time. He appears well-developed and well-nourished.  Cardiovascular: Normal rate, regular rhythm and normal heart sounds.  Pulmonary/Chest: Effort normal and breath sounds normal.  Neurological: He is alert and oriented to person, place, and time.  Skin: Skin is warm and dry.     LABORATORY DATA:  I have reviewed the labs as listed.  CBC    Component Value Date/Time   WBC 3.8 (L) 09/18/2017 0917   RBC 4.61 09/18/2017 0917   HGB 14.2 09/18/2017 0917   HCT 42.8 09/18/2017 0917   PLT 166 09/18/2017 0917   MCV 92.8 09/18/2017 0917   MCH 30.8 09/18/2017 0917   MCHC 33.2 09/18/2017 0917   RDW 14.3 09/18/2017 0917   LYMPHSABS 0.8 09/18/2017 0917   MONOABS 0.4 09/18/2017 0917   EOSABS 0.1 09/18/2017 0917   BASOSABS 0.0 09/18/2017 0917   CMP Latest Ref Rng & Units 09/18/2017 06/20/2017 06/17/2017  Glucose 70 - 99 mg/dL 112(H) 145(H) -  BUN 8 - 23 mg/dL 17 14 -  Creatinine 0.61 - 1.24 mg/dL 0.95 0.90 1.00  Sodium 135 - 145 mmol/L 139 136 -  Potassium 3.5 - 5.1 mmol/L 4.5 4.1 -  Chloride 98 - 111 mmol/L 103 101 -  CO2 22 - 32 mmol/L 28 28 -  Calcium 8.9 - 10.3 mg/dL 9.2 9.0 -  Total Protein 6.5 - 8.1 g/dL 7.0 7.0 -  Total Bilirubin 0.3 -  1.2 mg/dL 0.7 0.6 -  Alkaline Phos 38 - 126 U/L 42 38 -  AST 15 - 41 U/L 20 22 -  ALT 0 - 44 U/L 12 11(L) -       DIAGNOSTIC IMAGING:  I have reviewed the images of the CT of the chest with contrast dated 09/18/2017 and discussed with patient.      ASSESSMENT & PLAN:   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  Squamous  cell carcinoma of the right tongue: -Diagnosed in February 2018, underwent radiation therapy. - Complains of dysphagia to all sorts of foods.  PET scan on 06/16/2017 did not show any evidence of recurrence.  He follows up with Dr. Benjamine Mola.  -He is continuing to smoke 1 pack/day of cigarettes.  When I counseled to quit smoking at last visit, he told that he has no intention to quit.  2.  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  There is stable sub-5 mm right middle lobe and left lower lobe nodules. -I have reviewed the results of the CT of the chest with contrast dated 09/18/2017 which did not show any change in the right upper lobe nodule.  4 mm anterior right upper lobe nodule is also unchanged. -We will see him back in 6 months with repeat CT scan of the chest.  3.  Hypothyroidism: -His TSH is elevated at 19.2.  On further questioning he reveals that he is not taking Synthroid for the last 2 months. -He takes about 75 mcg Synthroid daily.  He was told to restart taking it.  He was told to follow-up with Dr. Olena Heckle in 2 months for labs.      Orders placed this encounter:  Orders Placed This Encounter  Procedures  . CT Chest W Contrast  . Lactate dehydrogenase  . CBC with Differential/Platelet  . Comprehensive metabolic panel  . TSH      Derek Jack, [place] [phone]

## 2017-09-22 NOTE — Assessment & Plan Note (Signed)
1.  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 - Complains of dysphagia to all sorts of foods.  PET scan on 06/16/2017 did not show any evidence of recurrence.  He follows up with Dr. Benjamine Mola.  -He is continuing to smoke 1 pack/day of cigarettes.  When I counseled to quit smoking at last visit, he told that he has no intention to quit.  2.  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  There is stable sub-5 mm right middle lobe and left lower lobe nodules. -I have reviewed the results of the CT of the chest with contrast dated 09/18/2017 which did not show any change in the right upper lobe nodule.  4 mm anterior right upper lobe nodule is also unchanged. -We will see him back in 6 months with repeat CT scan of the chest.  3.  Hypothyroidism: -His TSH is elevated at 19.2.  On further questioning he reveals that he is not taking Synthroid for the last 2 months. -He takes about 75 mcg Synthroid daily.  He was told to restart taking it.  He was told to follow-up with Dr. Olena Heckle in 2 months for labs.

## 2017-09-22 NOTE — Patient Instructions (Signed)
[facility] at [facility] Discharge Instructions     Thank you for choosing [facility] at [facility] to provide your oncology and hematology care.  To afford each patient quality time with our provider, please arrive at least 15 minutes before your scheduled appointment time.   If you have a lab appointment with the Cancer Center please come in thru the  Main Entrance and check in at the main information desk  You need to re-schedule your appointment should you arrive 10 or more minutes late.  We strive to give you quality time with our providers, and arriving late affects you and other patients whose appointments are after yours.  Also, if you no show three or more times for appointments you may be dismissed from the clinic at the providers discretion.     Again, thank you for choosing [facility].  Our hope is that these requests will decrease the amount of time that you wait before being seen by our physicians.       _____________________________________________________________  Should you have questions after your visit to [facility], please contact our office at [phone] between the hours of 8:00 a.m. and 4:30 p.m.  Voicemails left after 4:00 p.m. will not be returned until the following business day.  For prescription refill requests, have your pharmacy contact our office and allow 72 hours.    Cancer Center Support Programs:   > Cancer Support Group  2nd Tuesday of the month 1pm-2pm, Journey Room    

## 2017-10-24 DIAGNOSIS — E039 Hypothyroidism, unspecified: Secondary | ICD-10-CM | POA: Diagnosis not present

## 2017-10-24 DIAGNOSIS — K210 Gastro-esophageal reflux disease with esophagitis: Secondary | ICD-10-CM | POA: Diagnosis not present

## 2017-10-24 DIAGNOSIS — E782 Mixed hyperlipidemia: Secondary | ICD-10-CM | POA: Diagnosis not present

## 2017-10-27 DIAGNOSIS — F17210 Nicotine dependence, cigarettes, uncomplicated: Secondary | ICD-10-CM | POA: Diagnosis not present

## 2017-10-27 DIAGNOSIS — M545 Low back pain: Secondary | ICD-10-CM | POA: Diagnosis not present

## 2017-10-27 DIAGNOSIS — F331 Major depressive disorder, recurrent, moderate: Secondary | ICD-10-CM | POA: Diagnosis not present

## 2017-10-27 DIAGNOSIS — C3491 Malignant neoplasm of unspecified part of right bronchus or lung: Secondary | ICD-10-CM | POA: Diagnosis not present

## 2017-10-27 DIAGNOSIS — E039 Hypothyroidism, unspecified: Secondary | ICD-10-CM | POA: Diagnosis not present

## 2017-10-27 DIAGNOSIS — K219 Gastro-esophageal reflux disease without esophagitis: Secondary | ICD-10-CM | POA: Diagnosis not present

## 2017-10-27 DIAGNOSIS — E782 Mixed hyperlipidemia: Secondary | ICD-10-CM | POA: Diagnosis not present

## 2017-10-27 DIAGNOSIS — M1612 Unilateral primary osteoarthritis, left hip: Secondary | ICD-10-CM | POA: Diagnosis not present

## 2017-12-01 ENCOUNTER — Other Ambulatory Visit: Payer: Self-pay

## 2017-12-01 MED ORDER — PAIN MANAGEMENT IT PUMP REFILL: 1.0000 | Freq: Once | INTRATHECAL | 0 refills | Status: AC

## 2017-12-09 NOTE — Progress Notes (Signed)
Patient's Name: Darrell White  MRN: [id number]  Referring Provider: [name], MD  DOB: 1950-05-25  PCP: [name], MD  DOS: 12/10/2017  Note by: Gaspar Cola, MD  Service setting: Ambulatory outpatient  Specialty: Interventional Pain Management  Patient type: Established  Location: [facility] (AMB) Pain Management Facility  Visit type: Interventional Procedure   Primary Reason for Visit: Interventional Pain Management Treatment. CC: Back Pain  Procedure:          Intrathecal Drug Delivery System (IDDS):  Type: Reservoir Refill [phone]) No rate change Region: Abdominal Laterality: Right  Type of Pump: Medtronic Synchromed II (MRI-compatible) Delivery Route: Intrathecal Type of Pain Treated: Neuropathic/Nociceptive Primary Medication Class: Opioid/opiate  Medication, Concentration, Infusion Program, & Delivery Rate: Please see scanned programming printout.   Indications: 1. Failed back surgical syndrome   2. Chronic pain syndrome   3. Cancer-related pain   4. Chronic low back pain (Primary Area of Pain) (Bilateral) (R>L)   5. Chronic lower extremity pain (Secondary Area of Pain) (Bilateral) (R>L)   6. History of cervical spinal surgery   7. Presence of implanted infusion pump   8. Pharmacologic therapy   9. Adjustment and management of infusion pump    Pain Assessment: Self-Reported Pain Score: 7 /10 Clinically the patient looks like a 1/10 Reported level is inconsistent with clinical observations. Information on the proper use of the pain score provided to the patient today.  Intrathecal Pump Therapy Assessment  Manufacturer: Medtronic Synchromed II Type: Programmable Volume: 40 mL reservoir MRI compatibility: Yes   Drug content:  Primary Medication Class: Opioid Primary Medication: PF-Morphine (20 mg/mL) Secondary Medication: PF-Bupivacaine (20 mg/mL) Other Medication: see pump readout   Programming:  Type: Simple continuous. See pump readout for details.    Changes:  Medication Change: None at this point Rate Change: No change in rate  Reported side-effects or adverse reactions: None reported  Effectiveness: Described as relatively effective, allowing for increase in activities of daily living (ADL) Clinically meaningful improvement in function (CMIF): Sustained CMIF goals met  Plan: Pump refill today  Pre-op Assessment:  Mr. Weisel is a 67 y.o. (year old), male patient, seen today for interventional treatment. He  has a past surgical history that includes Mouth surgery; Cervical disc surgery; Shoulder arthroscopy; Back surgery; Spinal fusion; Spine surgery; Knee arthroscopy; Cataract extraction w/PHACO (Left, 04/09/2012); Direct laryngoscopy (N/A, 03/26/2016); Excision of tongue lesion (Right, 03/26/2016); OTHER SURGICAL HISTORY; and CT LUNG SCREENING (06/16/2017). Mr. Taras has a current medication list which includes the following prescription(s): acetaminophen, atorvastatin, docusate calcium, gabapentin, levothyroxine, mirtazapine, omeprazole, oxycodone hcl, trazodone, oxycodone hcl, and oxycodone hcl. His primarily concern today is the Back Pain  Initial Vital Signs:  Pulse/HCG Rate: 68  Temp: 98 F (36.7 C) Resp: 18 BP: 123/71 SpO2: 99 %  BMI: Estimated body mass index is 25.09 kg/m as calculated from the following:   Height as of this encounter: '5\' 8"'$  (1.727 m).   Weight as of this encounter: 165 lb (74.8 kg).  Risk Assessment: Allergies: Reviewed. He has No Known Allergies.  Allergy Precautions: None required Coagulopathies: Reviewed. None identified.  Blood-thinner therapy: None at this time Active Infection(s): Reviewed. None identified. Mr. Dilauro is afebrile  Site Confirmation: Mr. Hopkin was asked to confirm the procedure and laterality before marking the site Procedure checklist: Completed Consent: Before the procedure and under the influence of no sedative(s), amnesic(s), or anxiolytics, the patient was informed of the  treatment options, risks and possible complications. To fulfill  our ethical and legal obligations, as recommended by the American Medical Association's Code of Ethics, I have informed the patient of my clinical impression; the nature and purpose of the treatment or procedure; the risks, benefits, and possible complications of the intervention; the alternatives, including doing nothing; the risk(s) and benefit(s) of the alternative treatment(s) or procedure(s); and the risk(s) and benefit(s) of doing nothing.  Mr. Ramsay was provided with information about the general risks and possible complications associated with most interventional procedures. These include, but are not limited to: failure to achieve desired goals, infection, bleeding, organ or nerve damage, allergic reactions, paralysis, and/or death.  In addition, he was informed of those risks and possible complications associated to this particular procedure, which include, but are not limited to: damage to the implant; failure to decrease pain; local, systemic, or serious CNS infections, intraspinal abscess with possible cord compression and paralysis, or life-threatening such as meningitis; bleeding; organ damage; nerve injury or damage with subsequent sensory, motor, and/or autonomic system dysfunction, resulting in transient or permanent pain, numbness, and/or weakness of one or several areas of the body; allergic reactions, either minor or major life-threatening, such as anaphylactic or anaphylactoid reactions.  Furthermore, Mr. Jeffus was informed of those risks and complications associated with the medications. These include, but are not limited to: allergic reactions (i.e.: anaphylactic or anaphylactoid reactions); endorphine suppression; bradycardia and/or hypotension; water retention and/or peripheral vascular relaxation leading to lower extremity edema and possible stasis ulcers; respiratory depression and/or shortness of breath; decreased  metabolic rate leading to weight gain; swelling or edema; medication-induced neural toxicity; particulate matter embolism and blood vessel occlusion with resultant organ, and/or nervous system infarction; and/or intrathecal granuloma formation with possible spinal cord compression and permanent paralysis.  Before refilling the pump Mr. Huffaker was informed that some of the medications used in the devise may not be FDA approved for such use and therefore it constitutes an off-label use of the medications.  Finally, he was informed that Medicine is not an exact science; therefore, there is also the possibility of unforeseen or unpredictable risks and/or possible complications that may result in a catastrophic outcome. The patient indicated having understood very clearly. We have given the patient no guarantees and we have made no promises. Enough time was given to the patient to ask questions, all of which were answered to the patient's satisfaction. Mr. Gentzler has indicated that he wanted to continue with the procedure. Attestation: I, the ordering provider, attest that I have discussed with the patient the benefits, risks, side-effects, alternatives, likelihood of achieving goals, and potential problems during recovery for the procedure that I have provided informed consent. Date  Time: 12/10/2017 11:54 AM  Pre-Procedure Preparation:  Monitoring: As per clinic protocol. Respiration, ETCO2, SpO2, BP, heart rate and rhythm monitor placed and checked for adequate function Safety Precautions: Patient was assessed for positional comfort and pressure points before starting the procedure. Time-out: I initiated and conducted the "Time-out" before starting the procedure, as per protocol. The patient was asked to participate by confirming the accuracy of the "Time Out" information. Verification of the correct person, site, and procedure were performed and confirmed by me, the nursing staff, and the patient. "Time-out"  conducted as per Joint Commission's Universal Protocol (UP.01.01.01). Time: 1224  Description of Procedure:          Position: Supine Target Area: Central-port of intrathecal pump. Approach: Anterior, 90 degree angle approach. Area Prepped: Entire Area around the pump implant. Prepping solution: ChloraPrep (2% chlorhexidine  gluconate and 70% isopropyl alcohol) Safety Precautions: Aspiration looking for blood return was conducted prior to all injections. At no point did we inject any substances, as a needle was being advanced. No attempts were made at seeking any paresthesias. Safe injection practices and needle disposal techniques used. Medications properly checked for expiration dates. SDV (single dose vial) medications used. Description of the Procedure: Protocol guidelines were followed. Two nurses trained to do implant refills were present during the entire procedure. The refill medication was checked by both healthcare providers as well as the patient. The patient was included in the "Time-out" to verify the medication. The patient was placed in position. The pump was identified. The area was prepped in the usual manner. The sterile template was positioned over the pump, making sure the side-port location matched that of the pump. Both, the pump and the template were held for stability. The needle provided in the Medtronic Kit was then introduced thru the center of the template and into the central port. The pump content was aspirated and discarded volume documented. The new medication was slowly infused into the pump, thru the filter, making sure to avoid overpressure of the device. The needle was then removed and the area cleansed, making sure to leave some of the prepping solution back to take advantage of its long term bactericidal properties. The pump was interrogated and programmed to reflect the correct medication, volume, and dosage. The program was printed and taken to the physician for  approval. Once checked and signed by the physician, a copy was provided to the patient and another scanned into the EMR. Vitals:   12/10/17 1152 12/10/17 1255  BP: 123/71   Pulse: 68   Resp:  18  Temp: 98 F (36.7 C)   SpO2: 99%   Weight: 165 lb (74.8 kg)   Height: '5\' 8"'$  (1.727 m)     Start Time: 1225 hrs. End Time: 1249 hrs. Materials & Medications: Medtronic Refill Kit Medication(s): Please see chart orders for details.  Imaging Guidance:          Type of Imaging Technique: None used Indication(s): N/A Exposure Time: No patient exposure Contrast: None used. Fluoroscopic Guidance: N/A Ultrasound Guidance: N/A Interpretation: N/A  Antibiotic Prophylaxis:   Anti-infectives (From admission, onward)   None     Indication(s): None identified  Post-operative Assessment:  Post-procedure Vital Signs:  Pulse/HCG Rate: 68  Temp: 98 F (36.7 C) Resp: 18 BP: 123/71 SpO2: 99 %  EBL: None  Complications: No immediate post-treatment complications observed by team, or reported by patient.  Note: The patient tolerated the entire procedure well. A repeat set of vitals were taken after the procedure and the patient was kept under observation following institutional policy, for this type of procedure. Post-procedural neurological assessment was performed, showing return to baseline, prior to discharge. The patient was provided with post-procedure discharge instructions, including a section on how to identify potential problems. Should any problems arise concerning this procedure, the patient was given instructions to immediately contact us, at any time, without hesitation. In any case, we plan to contact the patient by telephone for a follow-up status report regarding this interventional procedure.  Comments:  No additional relevant information.  Controlled Substance Pharmacotherapy Assessment REMS (Risk Evaluation and Mitigation Strategy)  Analgesic: Oxycodone IR 10 mg 1 tablet  p.o. every 6 hours (40 mg/day of oxycodone) MME/day: 60 mg/day.  No notes on file Pharmacokinetics: Liberation and absorption (onset of action): WNL Distribution (time to peak effect): WNL  Metabolism and excretion (duration of action): WNL         Pharmacodynamics: Desired effects: Analgesia: Mr. Brier reports >50% benefit. Functional ability: Patient reports that medication allows him to accomplish basic ADLs Clinically meaningful improvement in function (CMIF): Sustained CMIF goals met Perceived effectiveness: Described as relatively effective, allowing for increase in activities of daily living (ADL) Undesirable effects: Side-effects or Adverse reactions: None reported Monitoring: [place] PMP: Online review of the past 26-monthperiod conducted. Compliant with practice rules and regulations Last UDS on record: Summary  Date Value Ref Range Status  10/16/2016 FINAL  Final    Comment:    ==================================================================== [facility] COMP DRUG ANALYSIS,UR ==================================================================== Test                             Result       Flag       Units Drug Present and Declared for Prescription Verification   Oxycodone                      >3534        EXPECTED   ng/mg creat   Oxymorphone                    3447         EXPECTED   ng/mg creat   Noroxycodone                   >3534        EXPECTED   ng/mg creat   Noroxymorphone                 701          EXPECTED   ng/mg creat    Sources of oxycodone are scheduled prescription medications.    Oxymorphone, noroxycodone, and noroxymorphone are expected    metabolites of oxycodone. Oxymorphone is also available as a    scheduled prescription medication.   Gabapentin                     PRESENT      EXPECTED   Mirtazapine                    PRESENT      EXPECTED   Trazodone                      PRESENT      EXPECTED   1,3 chlorophenyl piperazine    PRESENT      EXPECTED     1,3-chlorophenyl piperazine is an expected metabolite of    trazodone.   Metoprolol                     PRESENT      EXPECTED Drug Present not Declared for Prescription Verification   Morphine                       1194         UNEXPECTED ng/mg creat   Normorphine                    26           UNEXPECTED ng/mg creat    Potential sources of large amounts of morphine in the absence of    codeine include administration of morphine or use of heroin.  Normorphine is an expected metabolite of morphine. Drug Absent but Declared for Prescription Verification   Salicylate                     Not Detected UNEXPECTED    Aspirin, as indicated in the declared medication list, is not    always detected even when used as directed. ==================================================================== Test                      Result    Flag   Units      Ref Range   Creatinine              283              mg/dL      >=20 ==================================================================== Declared Medications:  The flagging and interpretation on this report are based on the  following declared medications.  Unexpected results may arise from  inaccuracies in the declared medications.  **Note: The testing scope of this panel includes these medications:  Gabapentin  Metoprolol  Mirtazapine  Oxycodone  Trazodone  **Note: The testing scope of this panel does not include small to  moderate amounts of these reported medications:  Aspirin (Aspirin 81)  **Note: The testing scope of this panel does not include following  reported medications:  Atorvastatin  Cyanocobalamin  Docusate  Levothyroxine  Omeprazole ==================================================================== For clinical consultation, please call [phone]. ====================================================================    UDS interpretation: Compliant          Medication Assessment Form: Reviewed. Patient indicates  being compliant with therapy Treatment compliance: Compliant Risk Assessment Profile: Aberrant behavior: See prior evaluations. None observed or detected today Comorbid factors increasing risk of overdose: See prior notes. No additional risks detected today Opioid risk tool (ORT) (Total Score): 0 Personal History of Substance Abuse (SUD-Substance use disorder):  Alcohol: Negative  Illegal Drugs: Negative  Rx Drugs: Negative  ORT Risk Level calculation: Low Risk Risk of substance use disorder (SUD): Low Opioid Risk Tool - 12/10/17 1158      Family History of Substance Abuse   Alcohol  Negative    Illegal Drugs  Negative    Rx Drugs  Negative      Personal History of Substance Abuse   Alcohol  Negative    Illegal Drugs  Negative    Rx Drugs  Negative      Age   Age between 56-45 years   No      History of Preadolescent Sexual Abuse   History of Preadolescent Sexual Abuse  Negative or Male      Psychological Disease   Psychological Disease  Negative    Depression  Negative      Total Score   Opioid Risk Tool Scoring  0    Opioid Risk Interpretation  Low Risk      ORT Scoring interpretation table:  Score <3 = Low Risk for SUD  Score between 4-7 = Moderate Risk for SUD  Score >8 = High Risk for Opioid Abuse   Risk Mitigation Strategies:  Patient Counseling: Covered Patient-Prescriber Agreement (PPA): Present and active  Notification to other healthcare providers: Done  Pharmacologic Plan: No change in therapy, at this time.             Assessment  Primary Diagnosis & Pertinent Problem List: The primary encounter diagnosis was Failed back surgical syndrome. Diagnoses of Chronic pain syndrome, Cancer-related pain, Chronic low back pain (Primary Area of Pain) (Bilateral) (R>L),  Chronic lower extremity pain (Secondary Area of Pain) (Bilateral) (R>L), History of cervical spinal surgery, Presence of implanted infusion pump, Pharmacologic therapy, and Adjustment and  management of infusion pump were also pertinent to this visit.  Status Diagnosis  Controlled Improved Controlled 1. Failed back surgical syndrome   2. Chronic pain syndrome   3. Cancer-related pain   4. Chronic low back pain (Primary Area of Pain) (Bilateral) (R>L)   5. Chronic lower extremity pain (Secondary Area of Pain) (Bilateral) (R>L)   6. History of cervical spinal surgery   7. Presence of implanted infusion pump   8. Pharmacologic therapy   9. Adjustment and management of infusion pump     Problems updated and reviewed during this visit: No problems updated. Plan of Care   Imaging Orders  No imaging studies ordered today   Procedure Orders    No procedure(s) ordered today    Medications ordered for procedure: Meds ordered this encounter  Medications  . Oxycodone HCl 10 MG TABS    Sig: Take 1 tablet (10 mg total) by mouth every 6 (six) hours as needed.    Dispense:  120 tablet    Refill:  0    [place] STOP ACT - Not applicable. Fill one day early if pharmacy is closed on scheduled refill date. Do not fill until: 02/17/18. Must last 30 days. To last until: 03/19/18.  Marland Kitchen Oxycodone HCl 10 MG TABS    Sig: Take 1 tablet (10 mg total) by mouth every 6 (six) hours as needed.    Dispense:  120 tablet    Refill:  0    [place] STOP ACT - Not applicable. Fill one day early if pharmacy is closed on scheduled refill date. Do not fill until: 01/18/18. Must last 30 days. To last until: 02/17/18.  Marland Kitchen Oxycodone HCl 10 MG TABS    Sig: Take 1 tablet (10 mg total) by mouth every 6 (six) hours as needed.    Dispense:  120 tablet    Refill:  0    [place] STOP ACT - Not applicable. Fill one day early if pharmacy is closed on scheduled refill date. Do not fill until: 12/19/17. Must last 30 days. To last until: 01/18/18.   Medications administered: Sabas Sous had no medications administered during this visit.  See the medical record for exact dosing, route, and time of  administration.  Disposition: Discharge home  Discharge Date & Time: 12/10/2017; 1300 hrs.   Physician-requested Follow-up: Return for Pump Refill (as per pump program) (Max:48mo.  Future Appointments  Date Time [place] 03/19/2018 11:00 AM NMilinda Pointer MD [facility] None  03/25/2018  9:00 AM [facility] None  03/25/2018 10:00 AM [facility] 1 [facility] [place] [facility]  03/27/2018 11:15 AM KDerek Jack MD [facility] None   Primary Care Physician: SManon Hilding [facility] Note by: FGaspar Cola MD Date: 12/10/2017; Time: 1:19 PM  Disclaimer:  Medicine is not an eChief Strategy Officer The only guarantee in medicine is that nothing is guaranteed. It is important to note that the decision to proceed with this intervention was based on the information collected from the patient. The Data and conclusions were drawn from the patient's questionnaire, the interview, and the physical examination. Because the information was provided in large part by the patient, it cannot be guaranteed that it has not been purposely or unconsciously manipulated. Every effort has been made to obtain as much relevant data as  possible for this evaluation. It is important to note that the conclusions that lead to this procedure are derived in large part from the available data. Always take into account that the treatment will also be dependent on availability of resources and existing treatment guidelines, considered by other Pain Management Practitioners as being common knowledge and practice, at the time of the intervention. For Medico-Legal purposes, it is also important to point out that variation in procedural techniques and pharmacological choices are the acceptable norm. The indications, contraindications, technique, and results of the above procedure should only be interpreted and judged by a Board-Certified Interventional Pain Specialist with extensive  familiarity and expertise in the same exact procedure and technique.

## 2017-12-10 ENCOUNTER — Ambulatory Visit: Payer: PPO | Attending: Pain Medicine | Admitting: Pain Medicine

## 2017-12-10 ENCOUNTER — Encounter: Payer: Self-pay | Admitting: Pain Medicine

## 2017-12-10 ENCOUNTER — Other Ambulatory Visit: Payer: Self-pay

## 2017-12-10 VITALS — BP 123/71 | HR 68 | Temp 98.0°F | Resp 18 | Ht 68.0 in | Wt 165.0 lb

## 2017-12-10 DIAGNOSIS — M5442 Lumbago with sciatica, left side: Secondary | ICD-10-CM | POA: Diagnosis not present

## 2017-12-10 DIAGNOSIS — Z451 Encounter for adjustment and management of infusion pump: Secondary | ICD-10-CM

## 2017-12-10 DIAGNOSIS — M549 Dorsalgia, unspecified: Secondary | ICD-10-CM | POA: Insufficient documentation

## 2017-12-10 DIAGNOSIS — Z79891 Long term (current) use of opiate analgesic: Secondary | ICD-10-CM | POA: Diagnosis not present

## 2017-12-10 DIAGNOSIS — Z95828 Presence of other vascular implants and grafts: Secondary | ICD-10-CM

## 2017-12-10 DIAGNOSIS — M79604 Pain in right leg: Secondary | ICD-10-CM | POA: Diagnosis not present

## 2017-12-10 DIAGNOSIS — M5441 Lumbago with sciatica, right side: Secondary | ICD-10-CM | POA: Diagnosis not present

## 2017-12-10 DIAGNOSIS — Z79890 Hormone replacement therapy (postmenopausal): Secondary | ICD-10-CM | POA: Diagnosis not present

## 2017-12-10 DIAGNOSIS — M545 Low back pain: Secondary | ICD-10-CM | POA: Insufficient documentation

## 2017-12-10 DIAGNOSIS — Z5181 Encounter for therapeutic drug level monitoring: Secondary | ICD-10-CM | POA: Insufficient documentation

## 2017-12-10 DIAGNOSIS — G893 Neoplasm related pain (acute) (chronic): Secondary | ICD-10-CM

## 2017-12-10 DIAGNOSIS — M4320 Fusion of spine, site unspecified: Secondary | ICD-10-CM | POA: Insufficient documentation

## 2017-12-10 DIAGNOSIS — M79605 Pain in left leg: Secondary | ICD-10-CM | POA: Diagnosis not present

## 2017-12-10 DIAGNOSIS — Z9889 Other specified postprocedural states: Secondary | ICD-10-CM | POA: Diagnosis not present

## 2017-12-10 DIAGNOSIS — M961 Postlaminectomy syndrome, not elsewhere classified: Secondary | ICD-10-CM | POA: Diagnosis not present

## 2017-12-10 DIAGNOSIS — G8929 Other chronic pain: Secondary | ICD-10-CM

## 2017-12-10 DIAGNOSIS — G894 Chronic pain syndrome: Secondary | ICD-10-CM

## 2017-12-10 DIAGNOSIS — Z79899 Other long term (current) drug therapy: Secondary | ICD-10-CM | POA: Diagnosis not present

## 2017-12-10 MED ORDER — OXYCODONE HCL 10 MG PO TABS: 10.0000 mg | ORAL_TABLET | Freq: Four times a day (QID) | ORAL | 0 refills | Status: DC | PRN

## 2017-12-10 NOTE — Patient Instructions (Addendum)
____________________________________________________________________________________________  Medication Rules  Applies to: All patients receiving prescriptions (written or electronic).  Pharmacy of record: Pharmacy where electronic prescriptions will be sent. If written prescriptions are taken to a different pharmacy, please inform the nursing staff. The pharmacy listed in the electronic medical record should be the one where you would like electronic prescriptions to be sent.  Prescription refills: Only during scheduled appointments. Applies to both, written and electronic prescriptions.  NOTE: The following applies primarily to controlled substances (Opioid* Pain Medications).   Patient's responsibilities: 1. Pain Pills: Bring all pain pills to every appointment (except for procedure appointments). 2. Pill Bottles: Bring pills in original pharmacy bottle. Always bring newest bottle. Bring bottle, even if empty. 3. Medication refills: You are responsible for knowing and keeping track of what medications you need refilled. The day before your appointment, write a list of all prescriptions that need to be refilled. Bring that list to your appointment and give it to the admitting nurse. Prescriptions will be written only during appointments. If you forget a medication, it will not be "Called in", "Faxed", or "electronically sent". You will need to get another appointment to get these prescribed. 4. Prescription Accuracy: You are responsible for carefully inspecting your prescriptions before leaving our office. Have the discharge nurse carefully go over each prescription with you, before taking them home. Make sure that your name is accurately spelled, that your address is correct. Check the name and dose of your medication to make sure it is accurate. Check the number of pills, and the written instructions to make sure they are clear and accurate. Make sure that you are given enough medication to last  until your next medication refill appointment. 5. Taking Medication: Take medication as prescribed. Never take more pills than instructed. Never take medication more frequently than prescribed. Taking less pills or less frequently is permitted and encouraged, when it comes to controlled substances (written prescriptions).  6. Inform other Doctors: Always inform, all of your healthcare providers, of all the medications you take. 7. Pain Medication from other Providers: You are not allowed to accept any additional pain medication from any other Doctor or Healthcare provider. There are two exceptions to this rule. (see below) In the event that you require additional pain medication, you are responsible for notifying us, as stated below. 8. Medication Agreement: You are responsible for carefully reading and following our Medication Agreement. This must be signed before receiving any prescriptions from our practice. Safely store a copy of your signed Agreement. Violations to the Agreement will result in no further prescriptions. (Additional copies of our Medication Agreement are available upon request.) 9. Laws, Rules, & Regulations: All patients are expected to follow all Federal and State Laws, Statutes, Rules, & Regulations. Ignorance of the Laws does not constitute a valid excuse. The use of any illegal substances is prohibited. 10. Adopted CDC guidelines & recommendations: Target dosing levels will be at or below 60 MME/day. Use of benzodiazepines** is not recommended.  Exceptions: There are only two exceptions to the rule of not receiving pain medications from other Healthcare Providers. 1. Exception #1 (Emergencies): In the event of an emergency (i.e.: accident requiring emergency care), you are allowed to receive additional pain medication. However, you are responsible for: As soon as you are able, call our office [phone], at any time of the day or night, and leave a message stating your name, the  date and nature of the emergency, and the name and dose of the medication   prescribed. In the event that your call is answered by a member of our staff, make sure to document and save the date, time, and the name of the person that took your information.  2. Exception #2 (Planned Surgery): In the event that you are scheduled by another doctor or dentist to have any type of surgery or procedure, you are allowed (for a period no longer than 30 days), to receive additional pain medication, for the acute post-op pain. However, in this case, you are responsible for picking up a copy of our "Post-op Pain Management for Surgeons" handout, and giving it to your surgeon or dentist. This document is available at our office, and does not require an appointment to obtain it. Simply go to our office during business hours (Monday-Thursday from 8:00 AM to 4:00 PM) (Friday 8:00 AM to 12:00 Noon) or if you have a scheduled appointment with us, prior to your surgery, and ask for it by name. In addition, you will need to provide us with your name, name of your surgeon, type of surgery, and date of procedure or surgery.  *Opioid medications include: morphine, codeine, oxycodone, oxymorphone, hydrocodone, hydromorphone, meperidine, tramadol, tapentadol, buprenorphine, fentanyl, methadone. **Benzodiazepine medications include: diazepam (Valium), alprazolam (Xanax), clonazepam (Klonopine), lorazepam (Ativan), clorazepate (Tranxene), chlordiazepoxide (Librium), estazolam (Prosom), oxazepam (Serax), temazepam (Restoril), triazolam (Halcion) (Last updated: 04/03/2017) ____________________________________________________________________________________________   ____________________________________________________________________________________________  Medication Recommendations and Reminders  Applies to: All patients receiving prescriptions (written and/or electronic).  Medication Rules & Regulations: These rules and  regulations exist for your safety and that of others. They are not flexible and neither are we. Dismissing or ignoring them will be considered "non-compliance" with medication therapy, resulting in complete and irreversible termination of such therapy. (See document titled "Medication Rules" for more details.) In all conscience, because of safety reasons, we cannot continue providing a therapy where the patient does not follow instructions.  Pharmacy of record:   Definition: This is the pharmacy where your electronic prescriptions will be sent.   We do not endorse any particular pharmacy.  You are not restricted in your choice of pharmacy.  The pharmacy listed in the electronic medical record should be the one where you want electronic prescriptions to be sent.  If you choose to change pharmacy, simply notify our nursing staff of your choice of new pharmacy.  Recommendations:  Keep all of your pain medications in a safe place, under lock and key, even if you live alone.   After you fill your prescription, take 1 week's worth of pills and put them away in a safe place. You should keep a separate, properly labeled bottle for this purpose. The remainder should be kept in the original bottle. Use this as your primary supply, until it runs out. Once it's gone, then you know that you have 1 week's worth of medicine, and it is time to come in for a prescription refill. If you do this correctly, it is unlikely that you will ever run out of medicine.  To make sure that the above recommendation works, it is very important that you make sure your medication refill appointments are scheduled at least 1 week before you run out of medicine. To do this in an effective manner, make sure that you do not leave the office without scheduling your next medication management appointment. Always ask the nursing staff to show you in your prescription , when your medication will be running out. Then arrange for the  receptionist to get you a return   appointment, at least 7 days before you run out of medicine. Do not wait until you have 1 or 2 pills left, to come in. This is very poor planning and does not take into consideration that we may need to cancel appointments due to bad weather, sickness, or emergencies affecting our staff.  "Partial Fill": If for any reason your pharmacy does not have enough pills/tablets to completely fill or refill your prescription, do not allow for a "partial fill". You will need a separate prescription to fill the remaining amount, which we will not provide. If the reason for the partial fill is your insurance, you will need to talk to the pharmacist about payment alternatives for the remaining tablets, but again, do not accept a partial fill.  Prescription refills and/or changes in medication(s):   Prescription refills, and/or changes in dose or medication, will be conducted only during scheduled medication management appointments. (Applies to both, written and electronic prescriptions.)  No refills on procedure days. No medication will be changed or started on procedure days. No changes, adjustments, and/or refills will be conducted on a procedure day. Doing so will interfere with the diagnostic portion of the procedure.  No phone refills. No medications will be "called into the pharmacy".  No Fax refills.  No weekend refills.  No Holliday refills.  No after hours refills.  Remember:  Business hours are:  Monday to Thursday 8:00 AM to 4:00 PM Provider's Schedule: Crystal King, NP - Appointments are:  Medication management: Monday to Thursday 8:00 AM to 4:00 PM Yaniah Thiemann, MD - Appointments are:  Medication management: Monday and Wednesday 8:00 AM to 4:00 PM Procedure day: Tuesday and Thursday 7:30 AM to 4:00 PM Bilal Lateef, MD - Appointments are:  Medication management: Tuesday and Thursday 8:00 AM to 4:00 PM Procedure day: Monday and Wednesday 7:30 AM to  4:00 PM (Last update: 04/03/2017) ____________________________________________________________________________________________   ____________________________________________________________________________________________  CANNABIDIOL (AKA: CBD Oil or Pills)  Applies to: All patients receiving prescriptions of controlled substances (written and/or electronic).  General Information: Cannabidiol (CBD) was discovered in 1940. It is one of some 113 identified cannabinoids in cannabis (Marijuana) plants, accounting for up to 40% of the plant's extract. As of 2018, preliminary clinical research on cannabidiol included studies of anxiety, cognition, movement disorders, and pain.  Cannabidiol is consummed in multiple ways, including inhalation of cannabis smoke or vapor, as an aerosol spray into the cheek, and by mouth. It may be supplied as CBD oil containing CBD as the active ingredient (no added tetrahydrocannabinol (THC) or terpenes), a full-plant CBD-dominant hemp extract oil, capsules, dried cannabis, or as a liquid solution. CBD is thought not have the same psychoactivity as THC, and may affect the actions of THC. Studies suggest that CBD may interact with different biological targets, including cannabinoid receptors and other neurotransmitter receptors. As of 2018 the mechanism of action for its biological effects has not been determined.  In the [place], cannabidiol has a limited approval by the Food and Drug Administration (FDA) for treatment of only two types of epilepsy disorders. The side effects of long-term use of the drug include somnolence, decreased appetite, diarrhea, fatigue, malaise, weakness, sleeping problems, and others.  CBD remains a Schedule I drug prohibited for any use.  Legality: Some manufacturers ship CBD products nationally, an illegal action which the FDA has not enforced in 2018, with CBD remaining the subject of an FDA investigational new drug evaluation, and is  not considered legal as a dietary supplement or   food ingredient as of December 2018. Federal illegality has made it difficult historically to conduct research on CBD. CBD is openly sold in head shops and health food stores in some states where such sales have not been explicitly legalized.  Warning: Because it is not FDA approved for general use or treatment of pain, it is not required to undergo the same manufacturing controls as prescription drugs.  This means that the available cannabidiol (CBD) may be contaminated with THC.  If this is the case, it will trigger a positive urine drug screen (UDS) test for cannabinoids (Marijuana).  Because a positive UDS for illicit substances is a violation of our medication agreement, your opioid analgesics (pain medicine) may be permanently discontinued. (Last update: 04/24/2017) ____________________________________________________________________________________________   ____________________________________________________________________________________________  Pain Scale  Introduction: The pain score used by this practice is the Verbal Numerical Rating Scale (VNRS-11). This is an 11-point scale. It is for adults and children 10 years or older. There are significant differences in how the pain score is reported, used, and applied. Forget everything you learned in the past and learn this scoring system.  General Information: The scale should reflect your current level of pain. Unless you are specifically asked for the level of your worst pain, or your average pain. If you are asked for one of these two, then it should be understood that it is over the past 24 hours.  Basic Activities of Daily Living (ADL): Personal hygiene, dressing, eating, transferring, and using restroom.  Instructions: Most patients tend to report their level of pain as a combination of two factors, their physical pain and their psychosocial pain. This last one is also known as  "suffering" and it is reflection of how physical pain affects you socially and psychologically. From now on, report them separately. From this point on, when asked to report your pain level, report only your physical pain. Use the following table for reference.  Pain Clinic Pain Levels (0-5/10)  Pain Level Score  Description  No Pain 0   Mild pain 1 Nagging, annoying, but does not interfere with basic activities of daily living (ADL). Patients are able to eat, bathe, get dressed, toileting (being able to get on and off the toilet and perform personal hygiene functions), transfer (move in and out of bed or a chair without assistance), and maintain continence (able to control bladder and bowel functions). Blood pressure and heart rate are unaffected. A normal heart rate for a healthy adult ranges from 60 to 100 bpm (beats per minute).   Mild to moderate pain 2 Noticeable and distracting. Impossible to hide from other people. More frequent flare-ups. Still possible to adapt and function close to normal. It can be very annoying and may have occasional stronger flare-ups. With discipline, patients may get used to it and adapt.   Moderate pain 3 Interferes significantly with activities of daily living (ADL). It becomes difficult to feed, bathe, get dressed, get on and off the toilet or to perform personal hygiene functions. Difficult to get in and out of bed or a chair without assistance. Very distracting. With effort, it can be ignored when deeply involved in activities.   Moderately severe pain 4 Impossible to ignore for more than a few minutes. With effort, patients may still be able to manage work or participate in some social activities. Very difficult to concentrate. Signs of autonomic nervous system discharge are evident: dilated pupils (mydriasis); mild sweating (diaphoresis); sleep interference. Heart rate becomes elevated (>115 bpm). Diastolic blood  pressure (lower number) rises above 100 mmHg.  Patients find relief in laying down and not moving.   Severe pain 5 Intense and extremely unpleasant. Associated with frowning face and frequent crying. Pain overwhelms the senses.  Ability to do any activity or maintain social relationships becomes significantly limited. Conversation becomes difficult. Pacing back and forth is common, as getting into a comfortable position is nearly impossible. Pain wakes you up from deep sleep. Physical signs will be obvious: pupillary dilation; increased sweating; goosebumps; brisk reflexes; cold, clammy hands and feet; nausea, vomiting or dry heaves; loss of appetite; significant sleep disturbance with inability to fall asleep or to remain asleep. When persistent, significant weight loss is observed due to the complete loss of appetite and sleep deprivation.  Blood pressure and heart rate becomes significantly elevated. Caution: If elevated blood pressure triggers a pounding headache associated with blurred vision, then the patient should immediately seek attention at an urgent or emergency care unit, as these may be signs of an impending stroke.    Emergency Department Pain Levels (6-10/10)  Emergency Room Pain 6 Severely limiting. Requires emergency care and should not be seen or managed at an outpatient pain management facility. Communication becomes difficult and requires great effort. Assistance to reach the emergency department may be required. Facial flushing and profuse sweating along with potentially dangerous increases in heart rate and blood pressure will be evident.   Distressing pain 7 Self-care is very difficult. Assistance is required to transport, or use restroom. Assistance to reach the emergency department will be required. Tasks requiring coordination, such as bathing and getting dressed become very difficult.   Disabling pain 8 Self-care is no longer possible. At this level, pain is disabling. The individual is unable to do even the most "basic"  activities such as walking, eating, bathing, dressing, transferring to a bed, or toileting. Fine motor skills are lost. It is difficult to think clearly.   Incapacitating pain 9 Pain becomes incapacitating. Thought processing is no longer possible. Difficult to remember your own name. Control of movement and coordination are lost.   The worst pain imaginable 10 At this level, most patients pass out from pain. When this level is reached, collapse of the autonomic nervous system occurs, leading to a sudden drop in blood pressure and heart rate. This in turn results in a temporary and dramatic drop in blood flow to the brain, leading to a loss of consciousness. Fainting is one of the body's self defense mechanisms. Passing out puts the brain in a calmed state and causes it to shut down for a while, in order to begin the healing process.    Summary: 1. Refer to this scale when providing [place] with your pain level. 2. Be accurate and careful when reporting your pain level. This will help with your care. 3. Over-reporting your pain level will lead to loss of credibility. 4. Even a level of 1/10 means that there is pain and will be treated at our facility. 5. High, inaccurate reporting will be documented as "Symptom Exaggeration", leading to loss of credibility and suspicions of possible secondary gains such as obtaining more narcotics, or wanting to appear disabled, for fraudulent reasons. 6. Only pain levels of 5 or below will be seen at our facility. 7. Pain levels of 6 and above will be sent to the Emergency Department and the appointment cancelled. ____________________________________________________________________________________________

## 2017-12-11 ENCOUNTER — Telehealth: Payer: Self-pay | Admitting: *Deleted

## 2017-12-11 NOTE — Telephone Encounter (Signed)
Attempted to call for follow-up from post pump refill appointment.

## 2018-01-28 DIAGNOSIS — S0990XA Unspecified injury of head, initial encounter: Secondary | ICD-10-CM | POA: Diagnosis not present

## 2018-01-28 DIAGNOSIS — S161XXA Strain of muscle, fascia and tendon at neck level, initial encounter: Secondary | ICD-10-CM | POA: Diagnosis not present

## 2018-01-28 DIAGNOSIS — W1839XA Other fall on same level, initial encounter: Secondary | ICD-10-CM | POA: Diagnosis not present

## 2018-01-28 DIAGNOSIS — M542 Cervicalgia: Secondary | ICD-10-CM | POA: Diagnosis not present

## 2018-01-28 DIAGNOSIS — F17200 Nicotine dependence, unspecified, uncomplicated: Secondary | ICD-10-CM | POA: Diagnosis not present

## 2018-01-29 ENCOUNTER — Telehealth: Payer: Self-pay

## 2018-01-29 NOTE — Telephone Encounter (Signed)
Spoke with Dorothea Ogle at the pharmacy to let him know that it is okay to fill Dilaudid for patient and that I have spoken with him and verbalizes u/o to take medication.

## 2018-01-29 NOTE — Telephone Encounter (Signed)
Spoke with patient and he states he feel about 1 week ago and hurt his neck.  The pain has gotten so bad that he went to the ED in [facility], and was treated and released with several Rx's, one of which was dilaudid.  They also performed a CT scan which revealed no Fx's.  They told him not to take the Dilaudid with his current pain medication to stop that and take dilaudid until he gets better.  Patient verbalizes u/o information.  I will call Scott at pharmacy and let him know that it is okay to fill.  Also explained to patient that he needs to let us know when an event like this happens so we can put it into his chart.

## 2018-01-29 NOTE — Telephone Encounter (Signed)
Received a call from [place] at [facility] in [place] stateing that patient has brought in a script for Dilaudid and was wondering if he should fill it. Please call at [phone]

## 2018-02-02 DIAGNOSIS — M542 Cervicalgia: Secondary | ICD-10-CM | POA: Diagnosis not present

## 2018-02-02 DIAGNOSIS — J441 Chronic obstructive pulmonary disease with (acute) exacerbation: Secondary | ICD-10-CM | POA: Diagnosis not present

## 2018-02-02 DIAGNOSIS — Z6822 Body mass index (BMI) 22.0-22.9, adult: Secondary | ICD-10-CM | POA: Diagnosis not present

## 2018-02-03 DIAGNOSIS — J441 Chronic obstructive pulmonary disease with (acute) exacerbation: Secondary | ICD-10-CM | POA: Diagnosis not present

## 2018-02-03 DIAGNOSIS — M542 Cervicalgia: Secondary | ICD-10-CM | POA: Diagnosis not present

## 2018-03-02 ENCOUNTER — Other Ambulatory Visit: Payer: Self-pay

## 2018-03-02 DIAGNOSIS — D638 Anemia in other chronic diseases classified elsewhere: Secondary | ICD-10-CM | POA: Diagnosis not present

## 2018-03-02 DIAGNOSIS — Z923 Personal history of irradiation: Secondary | ICD-10-CM | POA: Diagnosis not present

## 2018-03-02 DIAGNOSIS — I351 Nonrheumatic aortic (valve) insufficiency: Secondary | ICD-10-CM | POA: Diagnosis not present

## 2018-03-02 DIAGNOSIS — I4891 Unspecified atrial fibrillation: Secondary | ICD-10-CM | POA: Diagnosis not present

## 2018-03-02 DIAGNOSIS — Z85810 Personal history of malignant neoplasm of tongue: Secondary | ICD-10-CM | POA: Diagnosis not present

## 2018-03-02 DIAGNOSIS — I739 Peripheral vascular disease, unspecified: Secondary | ICD-10-CM | POA: Diagnosis not present

## 2018-03-02 DIAGNOSIS — I4892 Unspecified atrial flutter: Secondary | ICD-10-CM | POA: Diagnosis not present

## 2018-03-02 DIAGNOSIS — I517 Cardiomegaly: Secondary | ICD-10-CM | POA: Diagnosis not present

## 2018-03-02 DIAGNOSIS — F3340 Major depressive disorder, recurrent, in remission, unspecified: Secondary | ICD-10-CM | POA: Diagnosis not present

## 2018-03-02 DIAGNOSIS — M1612 Unilateral primary osteoarthritis, left hip: Secondary | ICD-10-CM | POA: Diagnosis not present

## 2018-03-02 DIAGNOSIS — K219 Gastro-esophageal reflux disease without esophagitis: Secondary | ICD-10-CM | POA: Diagnosis not present

## 2018-03-02 DIAGNOSIS — Z6827 Body mass index (BMI) 27.0-27.9, adult: Secondary | ICD-10-CM | POA: Diagnosis not present

## 2018-03-02 DIAGNOSIS — I349 Nonrheumatic mitral valve disorder, unspecified: Secondary | ICD-10-CM | POA: Diagnosis not present

## 2018-03-02 DIAGNOSIS — J441 Chronic obstructive pulmonary disease with (acute) exacerbation: Secondary | ICD-10-CM | POA: Diagnosis not present

## 2018-03-02 DIAGNOSIS — G8929 Other chronic pain: Secondary | ICD-10-CM | POA: Diagnosis not present

## 2018-03-02 DIAGNOSIS — R630 Anorexia: Secondary | ICD-10-CM | POA: Diagnosis not present

## 2018-03-02 DIAGNOSIS — Z85118 Personal history of other malignant neoplasm of bronchus and lung: Secondary | ICD-10-CM | POA: Diagnosis not present

## 2018-03-02 DIAGNOSIS — Z79899 Other long term (current) drug therapy: Secondary | ICD-10-CM | POA: Diagnosis not present

## 2018-03-02 DIAGNOSIS — F5104 Psychophysiologic insomnia: Secondary | ICD-10-CM | POA: Diagnosis not present

## 2018-03-02 DIAGNOSIS — I441 Atrioventricular block, second degree: Secondary | ICD-10-CM | POA: Diagnosis not present

## 2018-03-02 DIAGNOSIS — E785 Hyperlipidemia, unspecified: Secondary | ICD-10-CM | POA: Diagnosis not present

## 2018-03-02 DIAGNOSIS — E782 Mixed hyperlipidemia: Secondary | ICD-10-CM | POA: Diagnosis not present

## 2018-03-02 DIAGNOSIS — I340 Nonrheumatic mitral (valve) insufficiency: Secondary | ICD-10-CM | POA: Diagnosis not present

## 2018-03-02 DIAGNOSIS — M545 Low back pain: Secondary | ICD-10-CM | POA: Diagnosis not present

## 2018-03-02 DIAGNOSIS — F17210 Nicotine dependence, cigarettes, uncomplicated: Secondary | ICD-10-CM | POA: Diagnosis not present

## 2018-03-02 DIAGNOSIS — R000 Tachycardia, unspecified: Secondary | ICD-10-CM | POA: Diagnosis not present

## 2018-03-02 DIAGNOSIS — E039 Hypothyroidism, unspecified: Secondary | ICD-10-CM | POA: Diagnosis not present

## 2018-03-02 MED ORDER — PAIN MANAGEMENT IT PUMP REFILL: 1.0000 | Freq: Once | INTRATHECAL | 0 refills | Status: AC

## 2018-03-04 DIAGNOSIS — I351 Nonrheumatic aortic (valve) insufficiency: Secondary | ICD-10-CM | POA: Diagnosis not present

## 2018-03-04 DIAGNOSIS — I349 Nonrheumatic mitral valve disorder, unspecified: Secondary | ICD-10-CM | POA: Diagnosis not present

## 2018-03-04 DIAGNOSIS — I340 Nonrheumatic mitral (valve) insufficiency: Secondary | ICD-10-CM | POA: Diagnosis not present

## 2018-03-04 DIAGNOSIS — I4891 Unspecified atrial fibrillation: Secondary | ICD-10-CM | POA: Diagnosis not present

## 2018-03-04 DIAGNOSIS — I517 Cardiomegaly: Secondary | ICD-10-CM | POA: Diagnosis not present

## 2018-03-07 DIAGNOSIS — I4891 Unspecified atrial fibrillation: Secondary | ICD-10-CM | POA: Diagnosis not present

## 2018-03-07 DIAGNOSIS — R790 Abnormal level of blood mineral: Secondary | ICD-10-CM | POA: Diagnosis not present

## 2018-03-07 DIAGNOSIS — R000 Tachycardia, unspecified: Secondary | ICD-10-CM | POA: Diagnosis not present

## 2018-03-08 DIAGNOSIS — Z79899 Other long term (current) drug therapy: Secondary | ICD-10-CM | POA: Diagnosis not present

## 2018-03-08 DIAGNOSIS — K219 Gastro-esophageal reflux disease without esophagitis: Secondary | ICD-10-CM | POA: Diagnosis not present

## 2018-03-08 DIAGNOSIS — I349 Nonrheumatic mitral valve disorder, unspecified: Secondary | ICD-10-CM | POA: Diagnosis not present

## 2018-03-08 DIAGNOSIS — I480 Paroxysmal atrial fibrillation: Secondary | ICD-10-CM | POA: Diagnosis not present

## 2018-03-08 DIAGNOSIS — I340 Nonrheumatic mitral (valve) insufficiency: Secondary | ICD-10-CM | POA: Diagnosis not present

## 2018-03-08 DIAGNOSIS — Z85118 Personal history of other malignant neoplasm of bronchus and lung: Secondary | ICD-10-CM | POA: Diagnosis not present

## 2018-03-08 DIAGNOSIS — I4891 Unspecified atrial fibrillation: Secondary | ICD-10-CM | POA: Insufficient documentation

## 2018-03-08 DIAGNOSIS — J90 Pleural effusion, not elsewhere classified: Secondary | ICD-10-CM | POA: Diagnosis not present

## 2018-03-08 DIAGNOSIS — E039 Hypothyroidism, unspecified: Secondary | ICD-10-CM | POA: Diagnosis not present

## 2018-03-08 DIAGNOSIS — G8929 Other chronic pain: Secondary | ICD-10-CM | POA: Diagnosis not present

## 2018-03-08 DIAGNOSIS — J441 Chronic obstructive pulmonary disease with (acute) exacerbation: Secondary | ICD-10-CM | POA: Diagnosis not present

## 2018-03-08 DIAGNOSIS — I471 Supraventricular tachycardia: Secondary | ICD-10-CM | POA: Diagnosis not present

## 2018-03-08 DIAGNOSIS — Z85810 Personal history of malignant neoplasm of tongue: Secondary | ICD-10-CM | POA: Diagnosis not present

## 2018-03-08 DIAGNOSIS — R509 Fever, unspecified: Secondary | ICD-10-CM | POA: Diagnosis not present

## 2018-03-08 DIAGNOSIS — M549 Dorsalgia, unspecified: Secondary | ICD-10-CM | POA: Diagnosis not present

## 2018-03-08 DIAGNOSIS — F3340 Major depressive disorder, recurrent, in remission, unspecified: Secondary | ICD-10-CM | POA: Diagnosis not present

## 2018-03-08 DIAGNOSIS — Z923 Personal history of irradiation: Secondary | ICD-10-CM | POA: Diagnosis not present

## 2018-03-08 DIAGNOSIS — M545 Low back pain: Secondary | ICD-10-CM | POA: Diagnosis not present

## 2018-03-08 DIAGNOSIS — I517 Cardiomegaly: Secondary | ICD-10-CM | POA: Diagnosis not present

## 2018-03-08 DIAGNOSIS — I4892 Unspecified atrial flutter: Secondary | ICD-10-CM | POA: Insufficient documentation

## 2018-03-08 DIAGNOSIS — M792 Neuralgia and neuritis, unspecified: Secondary | ICD-10-CM | POA: Diagnosis not present

## 2018-03-08 DIAGNOSIS — R000 Tachycardia, unspecified: Secondary | ICD-10-CM | POA: Diagnosis not present

## 2018-03-08 DIAGNOSIS — J811 Chronic pulmonary edema: Secondary | ICD-10-CM | POA: Diagnosis not present

## 2018-03-08 DIAGNOSIS — I4949 Other premature depolarization: Secondary | ICD-10-CM | POA: Diagnosis not present

## 2018-03-08 DIAGNOSIS — F17210 Nicotine dependence, cigarettes, uncomplicated: Secondary | ICD-10-CM | POA: Diagnosis not present

## 2018-03-08 DIAGNOSIS — Z716 Tobacco abuse counseling: Secondary | ICD-10-CM | POA: Diagnosis not present

## 2018-03-08 DIAGNOSIS — F17200 Nicotine dependence, unspecified, uncomplicated: Secondary | ICD-10-CM | POA: Insufficient documentation

## 2018-03-08 DIAGNOSIS — J189 Pneumonia, unspecified organism: Secondary | ICD-10-CM | POA: Diagnosis not present

## 2018-03-08 DIAGNOSIS — Z7901 Long term (current) use of anticoagulants: Secondary | ICD-10-CM | POA: Diagnosis not present

## 2018-03-08 DIAGNOSIS — R790 Abnormal level of blood mineral: Secondary | ICD-10-CM | POA: Diagnosis not present

## 2018-03-08 DIAGNOSIS — Z720 Tobacco use: Secondary | ICD-10-CM | POA: Diagnosis not present

## 2018-03-08 DIAGNOSIS — E785 Hyperlipidemia, unspecified: Secondary | ICD-10-CM | POA: Diagnosis not present

## 2018-03-10 ENCOUNTER — Other Ambulatory Visit: Payer: Self-pay | Admitting: *Deleted

## 2018-03-10 NOTE — Patient Outreach (Signed)
[facility]) Care Management  03/10/2018  Darrell White 1951/02/03 [id number]   Transition of Care Referral   Referral Date: 03/10/2018 Referral Source: [facility] Urgent Outreach for [facility]  Date of Admission: unknown Diagnosis: Unspecified atrial flutter Date of Discharge: Facility: [facility] on 03/05/18.  Insurance: [employer]    Outreach attempt # 1 [name] answered. She informed [facility] RN CM that [name] had to return to hospital on 03/07/2018 and he was then transferred to [facility] on 03/08/2018, where he remains at the time of this call  CM apologized for the call and reviewed the transition of care referral CM explained that [name] may be contact by [facility] staff after discharge home from hospital    Plan: [facility] RN CM sent an unsuccessful outreach letter and closed this patient case as he is presently hospitalized at [place]. Lavina Hamman, RN, BSN, [place] Management Care Coordinator Direct Number [phone] Mobile number [phone]  Main [facility] number [phone] Fax number [phone]

## 2018-03-12 DIAGNOSIS — R9439 Abnormal result of other cardiovascular function study: Secondary | ICD-10-CM | POA: Insufficient documentation

## 2018-03-16 DIAGNOSIS — I4892 Unspecified atrial flutter: Secondary | ICD-10-CM | POA: Diagnosis not present

## 2018-03-16 DIAGNOSIS — F17210 Nicotine dependence, cigarettes, uncomplicated: Secondary | ICD-10-CM | POA: Diagnosis not present

## 2018-03-16 DIAGNOSIS — I4891 Unspecified atrial fibrillation: Secondary | ICD-10-CM | POA: Diagnosis not present

## 2018-03-16 DIAGNOSIS — I2510 Atherosclerotic heart disease of native coronary artery without angina pectoris: Secondary | ICD-10-CM | POA: Insufficient documentation

## 2018-03-18 DIAGNOSIS — F17200 Nicotine dependence, unspecified, uncomplicated: Secondary | ICD-10-CM | POA: Diagnosis not present

## 2018-03-18 DIAGNOSIS — I4891 Unspecified atrial fibrillation: Secondary | ICD-10-CM | POA: Diagnosis not present

## 2018-03-18 DIAGNOSIS — R9439 Abnormal result of other cardiovascular function study: Secondary | ICD-10-CM | POA: Diagnosis not present

## 2018-03-18 DIAGNOSIS — I2510 Atherosclerotic heart disease of native coronary artery without angina pectoris: Secondary | ICD-10-CM | POA: Diagnosis not present

## 2018-03-19 ENCOUNTER — Encounter: Payer: PPO | Admitting: Pain Medicine

## 2018-03-19 DIAGNOSIS — Z85810 Personal history of malignant neoplasm of tongue: Secondary | ICD-10-CM | POA: Diagnosis not present

## 2018-03-19 DIAGNOSIS — R05 Cough: Secondary | ICD-10-CM | POA: Diagnosis not present

## 2018-03-19 DIAGNOSIS — J42 Unspecified chronic bronchitis: Secondary | ICD-10-CM | POA: Diagnosis not present

## 2018-03-19 DIAGNOSIS — Z79899 Other long term (current) drug therapy: Secondary | ICD-10-CM | POA: Diagnosis not present

## 2018-03-19 DIAGNOSIS — Z85118 Personal history of other malignant neoplasm of bronchus and lung: Secondary | ICD-10-CM | POA: Diagnosis not present

## 2018-03-19 DIAGNOSIS — I471 Supraventricular tachycardia: Secondary | ICD-10-CM | POA: Diagnosis not present

## 2018-03-19 DIAGNOSIS — G8929 Other chronic pain: Secondary | ICD-10-CM | POA: Diagnosis not present

## 2018-03-19 DIAGNOSIS — R509 Fever, unspecified: Secondary | ICD-10-CM | POA: Diagnosis not present

## 2018-03-19 DIAGNOSIS — I2510 Atherosclerotic heart disease of native coronary artery without angina pectoris: Secondary | ICD-10-CM | POA: Diagnosis not present

## 2018-03-19 DIAGNOSIS — F17200 Nicotine dependence, unspecified, uncomplicated: Secondary | ICD-10-CM | POA: Diagnosis not present

## 2018-03-19 DIAGNOSIS — I4892 Unspecified atrial flutter: Secondary | ICD-10-CM | POA: Diagnosis not present

## 2018-03-19 DIAGNOSIS — R062 Wheezing: Secondary | ICD-10-CM | POA: Diagnosis not present

## 2018-03-19 DIAGNOSIS — I483 Typical atrial flutter: Secondary | ICD-10-CM | POA: Diagnosis not present

## 2018-03-19 DIAGNOSIS — R0602 Shortness of breath: Secondary | ICD-10-CM | POA: Diagnosis not present

## 2018-03-19 DIAGNOSIS — I482 Chronic atrial fibrillation, unspecified: Secondary | ICD-10-CM | POA: Diagnosis not present

## 2018-03-19 DIAGNOSIS — Z7901 Long term (current) use of anticoagulants: Secondary | ICD-10-CM | POA: Diagnosis not present

## 2018-03-19 DIAGNOSIS — J449 Chronic obstructive pulmonary disease, unspecified: Secondary | ICD-10-CM | POA: Diagnosis not present

## 2018-03-19 DIAGNOSIS — Z7951 Long term (current) use of inhaled steroids: Secondary | ICD-10-CM | POA: Diagnosis not present

## 2018-03-19 DIAGNOSIS — M549 Dorsalgia, unspecified: Secondary | ICD-10-CM | POA: Diagnosis not present

## 2018-03-19 DIAGNOSIS — R079 Chest pain, unspecified: Secondary | ICD-10-CM | POA: Diagnosis not present

## 2018-03-19 DIAGNOSIS — Z6826 Body mass index (BMI) 26.0-26.9, adult: Secondary | ICD-10-CM | POA: Diagnosis not present

## 2018-03-19 DIAGNOSIS — Z923 Personal history of irradiation: Secondary | ICD-10-CM | POA: Diagnosis not present

## 2018-03-19 DIAGNOSIS — E039 Hypothyroidism, unspecified: Secondary | ICD-10-CM | POA: Diagnosis not present

## 2018-03-19 DIAGNOSIS — R9439 Abnormal result of other cardiovascular function study: Secondary | ICD-10-CM | POA: Diagnosis not present

## 2018-03-19 DIAGNOSIS — Z720 Tobacco use: Secondary | ICD-10-CM | POA: Diagnosis not present

## 2018-03-19 DIAGNOSIS — E785 Hyperlipidemia, unspecified: Secondary | ICD-10-CM | POA: Diagnosis not present

## 2018-03-19 DIAGNOSIS — R001 Bradycardia, unspecified: Secondary | ICD-10-CM | POA: Diagnosis not present

## 2018-03-19 DIAGNOSIS — I6529 Occlusion and stenosis of unspecified carotid artery: Secondary | ICD-10-CM | POA: Diagnosis not present

## 2018-03-19 DIAGNOSIS — I4819 Other persistent atrial fibrillation: Secondary | ICD-10-CM | POA: Diagnosis not present

## 2018-03-19 DIAGNOSIS — E7800 Pure hypercholesterolemia, unspecified: Secondary | ICD-10-CM | POA: Diagnosis not present

## 2018-03-19 DIAGNOSIS — I4891 Unspecified atrial fibrillation: Secondary | ICD-10-CM | POA: Diagnosis not present

## 2018-03-19 DIAGNOSIS — I10 Essential (primary) hypertension: Secondary | ICD-10-CM | POA: Diagnosis not present

## 2018-03-19 DIAGNOSIS — E440 Moderate protein-calorie malnutrition: Secondary | ICD-10-CM | POA: Diagnosis not present

## 2018-03-19 DIAGNOSIS — F17210 Nicotine dependence, cigarettes, uncomplicated: Secondary | ICD-10-CM | POA: Diagnosis not present

## 2018-03-19 NOTE — Progress Notes (Deleted)
Patient's Name: Darrell White  MRN: [id number]  Referring Provider: [name], MD  DOB: Nov 14, 1950  PCP: [name], MD  DOS: 03/19/2018  Note by: Gaspar Cola, MD  Service setting: Ambulatory outpatient  Specialty: Interventional Pain Management  Patient type: Established  Location: [facility] (AMB) Pain Management Facility  Visit type: Interventional Procedure   Primary Reason for Visit: Interventional Pain Management Treatment. CC: No chief complaint on file.  Procedure:          Intrathecal Drug Delivery System (IDDS):  Type: Reservoir Refill [phone]) No rate change Region: Abdominal Laterality: Right  Type of Pump: Medtronic Synchromed II (MRI-compatible) Delivery Route: Intrathecal Type of Pain Treated: Neuropathic/Nociceptive Primary Medication Class: Opioid/opiate  Medication, Concentration, Infusion Program, & Delivery Rate: Please see scanned programming printout.   Indications: 1. Chronic pain syndrome   2. Cancer-related pain   3. DDD (degenerative disc disease), lumbar   4. Failed back surgical syndrome   5. Lumbar spondylosis   6. Presence of implanted infusion pump   7. Adjustment and management of infusion pump    Pain Assessment: Self-Reported Pain Score:  /10             Reported level is compatible with observation.        Intrathecal Pump Therapy Assessment  Manufacturer: Medtronic Synchromed Type: Programmable Volume: 40 mL reservoir MRI compatibility: Yes   Drug content:  Primary Medication Class: Opioid Primary Medication: PF-Morphine (20 mg/mL)  Secondary Medication: PF-Bupivacaine  (20 mg/mL)  Other Medication: None   Programming:  Type: Simple continuous. See pump readout for details.   Changes:  Medication Change: None at this point Rate Change: No change in rate  Reported side-effects or adverse reactions: None reported  Effectiveness: Described as relatively effective, allowing for increase in activities of daily living  (ADL) Clinically meaningful improvement in function (CMIF): Sustained CMIF goals met  Plan: Pump refill today  Pre-op Assessment:  Darrell White is a 68 y.o. (year old), male patient, seen today for interventional treatment. He  has a past surgical history that includes Mouth surgery; Cervical disc surgery; Shoulder arthroscopy; Back surgery; Spinal fusion; Spine surgery; Knee arthroscopy; Cataract extraction w/PHACO (Left, 04/09/2012); Direct laryngoscopy (N/A, 03/26/2016); Excision of tongue lesion (Right, 03/26/2016); OTHER SURGICAL HISTORY; and CT LUNG SCREENING (06/16/2017). Darrell White has a current medication list which includes the following prescription(s): acetaminophen, atorvastatin, docusate calcium, gabapentin, levothyroxine, mirtazapine, omeprazole, oxycodone hcl, oxycodone hcl, oxycodone hcl, and trazodone. His primarily concern today is the No chief complaint on file.  Initial Vital Signs:  Pulse/HCG Rate:    Temp:   Resp:   BP:   SpO2:    BMI: Estimated body mass index is 25.09 kg/m as calculated from the following:   Height as of 12/10/17: '5\' 8"'$  (1.727 m).   Weight as of 12/10/17: 165 lb (74.8 kg).  Risk Assessment: Allergies: Reviewed. He has No Known Allergies.  Allergy Precautions: None required Coagulopathies: Reviewed. None identified.  Blood-thinner therapy: None at this time Active Infection(s): Reviewed. None identified. Darrell White is afebrile  Site Confirmation: Darrell White was asked to confirm the procedure and laterality before marking the site Procedure checklist: Completed Consent: Before the procedure and under the influence of no sedative(s), amnesic(s), or anxiolytics, the patient was informed of the treatment options, risks and possible complications. To fulfill our ethical and legal obligations, as recommended by the American Medical Association's Code of Ethics, I have informed the patient of my clinical impression; the nature  and purpose of the treatment or  procedure; the risks, benefits, and possible complications of the intervention; the alternatives, including doing nothing; the risk(s) and benefit(s) of the alternative treatment(s) or procedure(s); and the risk(s) and benefit(s) of doing nothing.  Darrell White was provided with information about the general risks and possible complications associated with most interventional procedures. These include, but are not limited to: failure to achieve desired goals, infection, bleeding, organ or nerve damage, allergic reactions, paralysis, and/or death.  In addition, he was informed of those risks and possible complications associated to this particular procedure, which include, but are not limited to: damage to the implant; failure to decrease pain; local, systemic, or serious CNS infections, intraspinal abscess with possible cord compression and paralysis, or life-threatening such as meningitis; bleeding; organ damage; nerve injury or damage with subsequent sensory, motor, and/or autonomic system dysfunction, resulting in transient or permanent pain, numbness, and/or weakness of one or several areas of the body; allergic reactions, either minor or major life-threatening, such as anaphylactic or anaphylactoid reactions.  Furthermore, Darrell White was informed of those risks and complications associated with the medications. These include, but are not limited to: allergic reactions (i.e.: anaphylactic or anaphylactoid reactions); endorphine suppression; bradycardia and/or hypotension; water retention and/or peripheral vascular relaxation leading to lower extremity edema and possible stasis ulcers; respiratory depression and/or shortness of breath; decreased metabolic rate leading to weight gain; swelling or edema; medication-induced neural toxicity; particulate matter embolism and blood vessel occlusion with resultant organ, and/or nervous system infarction; and/or intrathecal granuloma formation with possible spinal cord  compression and permanent paralysis.  Before refilling the pump Darrell White was informed that some of the medications used in the devise may not be FDA approved for such use and therefore it constitutes an off-label use of the medications.  Finally, he was informed that Medicine is not an exact science; therefore, there is also the possibility of unforeseen or unpredictable risks and/or possible complications that may result in a catastrophic outcome. The patient indicated having understood very clearly. We have given the patient no guarantees and we have made no promises. Enough time was given to the patient to ask questions, all of which were answered to the patient's satisfaction. Darrell White has indicated that he wanted to continue with the procedure. Attestation: I, the ordering provider, attest that I have discussed with the patient the benefits, risks, side-effects, alternatives, likelihood of achieving goals, and potential problems during recovery for the procedure that I have provided informed consent. Date  Time: {CHL [facility]-PAIN TIME CHOICES:21018001}  Pre-Procedure Preparation:  Monitoring: As per clinic protocol. Respiration, ETCO2, SpO2, BP, heart rate and rhythm monitor placed and checked for adequate function Safety Precautions: Patient was assessed for positional comfort and pressure points before starting the procedure. Time-out: I initiated and conducted the "Time-out" before starting the procedure, as per protocol. The patient was asked to participate by confirming the accuracy of the "Time Out" information. Verification of the correct person, site, and procedure were performed and confirmed by me, the nursing staff, and the patient. "Time-out" conducted as per Joint Commission's Universal Protocol (UP.01.01.01). Time:    Description of Procedure:          Position: Supine Target Area: Central-port of intrathecal pump. Approach: Anterior, 90 degree angle approach. Area Prepped:  Entire Area around the pump implant. Prepping solution: ChloraPrep (2% chlorhexidine gluconate and 70% isopropyl alcohol) Safety Precautions: Aspiration looking for blood return was conducted prior to all injections. At no point did we  inject any substances, as a needle was being advanced. No attempts were made at seeking any paresthesias. Safe injection practices and needle disposal techniques used. Medications properly checked for expiration dates. SDV (single dose vial) medications used. Description of the Procedure: Protocol guidelines were followed. Two nurses trained to do implant refills were present during the entire procedure. The refill medication was checked by both healthcare providers as well as the patient. The patient was included in the "Time-out" to verify the medication. The patient was placed in position. The pump was identified. The area was prepped in the usual manner. The sterile template was positioned over the pump, making sure the side-port location matched that of the pump. Both, the pump and the template were held for stability. The needle provided in the Medtronic Kit was then introduced thru the center of the template and into the central port. The pump content was aspirated and discarded volume documented. The new medication was slowly infused into the pump, thru the filter, making sure to avoid overpressure of the device. The needle was then removed and the area cleansed, making sure to leave some of the prepping solution back to take advantage of its long term bactericidal properties. The pump was interrogated and programmed to reflect the correct medication, volume, and dosage. The program was printed and taken to the physician for approval. Once checked and signed by the physician, a copy was provided to the patient and another scanned into the EMR. There were no vitals filed for this visit.  Start Time:   hrs. End Time:   hrs. Materials & Medications: Medtronic Refill  Kit Medication(s): Please see chart orders for details.  Imaging Guidance:          Type of Imaging Technique: None used Indication(s): N/A Exposure Time: No patient exposure Contrast: None used. Fluoroscopic Guidance: N/A Ultrasound Guidance: N/A Interpretation: N/A  Antibiotic Prophylaxis:   Anti-infectives (From admission, onward)   None     Indication(s): None identified  Post-operative Assessment:  Post-procedure Vital Signs:  Pulse/HCG Rate:    Temp:   Resp:   BP:   SpO2:    EBL: None  Complications: No immediate post-treatment complications observed by team, or reported by patient.  Note: The patient tolerated the entire procedure well. A repeat set of vitals were taken after the procedure and the patient was kept under observation following institutional policy, for this type of procedure. Post-procedural neurological assessment was performed, showing return to baseline, prior to discharge. The patient was provided with post-procedure discharge instructions, including a section on how to identify potential problems. Should any problems arise concerning this procedure, the patient was given instructions to immediately contact us, at any time, without hesitation. In any case, we plan to contact the patient by telephone for a follow-up status report regarding this interventional procedure.  Comments:  No additional relevant information.  Controlled Substance Pharmacotherapy Assessment REMS (Risk Evaluation and Mitigation Strategy)  Analgesic: Oxycodone IR 10 mg every 6 hours (40 mg/day of oxycodone) MME/day: 60 mg/day.  No notes on file Pharmacokinetics: Liberation and absorption (onset of action): WNL Distribution (time to peak effect): WNL Metabolism and excretion (duration of action): WNL         Pharmacodynamics: Desired effects: Analgesia: Darrell White reports >50% benefit. Functional ability: Patient reports that medication allows him to accomplish basic  ADLs Clinically meaningful improvement in function (CMIF): Sustained CMIF goals met Perceived effectiveness: Described as relatively effective, allowing for increase in activities of daily living (ADL)  Undesirable effects: Side-effects or Adverse reactions: None reported Monitoring: [place] PMP: Online review of the past 28-monthperiod conducted. Compliant with practice rules and regulations Last UDS on record: Summary  Date Value Ref Range Status  10/16/2016 FINAL  Final    Comment:    ==================================================================== [facility] COMP DRUG ANALYSIS,UR ==================================================================== Test                             Result       Flag       Units Drug Present and Declared for Prescription Verification   Oxycodone                      >3534        EXPECTED   ng/mg creat   Oxymorphone                    3447         EXPECTED   ng/mg creat   Noroxycodone                   >3534        EXPECTED   ng/mg creat   Noroxymorphone                 701          EXPECTED   ng/mg creat    Sources of oxycodone are scheduled prescription medications.    Oxymorphone, noroxycodone, and noroxymorphone are expected    metabolites of oxycodone. Oxymorphone is also available as a    scheduled prescription medication.   Gabapentin                     PRESENT      EXPECTED   Mirtazapine                    PRESENT      EXPECTED   Trazodone                      PRESENT      EXPECTED   1,3 chlorophenyl piperazine    PRESENT      EXPECTED    1,3-chlorophenyl piperazine is an expected metabolite of    trazodone.   Metoprolol                     PRESENT      EXPECTED Drug Present not Declared for Prescription Verification   Morphine                       1194         UNEXPECTED ng/mg creat   Normorphine                    26           UNEXPECTED ng/mg creat    Potential sources of large amounts of morphine in the absence of    codeine include  administration of morphine or use of heroin.    Normorphine is an expected metabolite of morphine. Drug Absent but Declared for Prescription Verification   Salicylate                     Not Detected UNEXPECTED    Aspirin, as indicated in the declared medication list, is not    always detected  even when used as directed. ==================================================================== Test                      Result    Flag   Units      Ref Range   Creatinine              283              mg/dL      >=20 ==================================================================== Declared Medications:  The flagging and interpretation on this report are based on the  following declared medications.  Unexpected results may arise from  inaccuracies in the declared medications.  **Note: The testing scope of this panel includes these medications:  Gabapentin  Metoprolol  Mirtazapine  Oxycodone  Trazodone  **Note: The testing scope of this panel does not include small to  moderate amounts of these reported medications:  Aspirin (Aspirin 81)  **Note: The testing scope of this panel does not include following  reported medications:  Atorvastatin  Cyanocobalamin  Docusate  Levothyroxine  Omeprazole ==================================================================== For clinical consultation, please call [phone]. ====================================================================    UDS interpretation: Compliant          Medication Assessment Form: Reviewed. Patient indicates being compliant with therapy Treatment compliance: Compliant Risk Assessment Profile: Aberrant behavior: See initial evaluations. None observed or detected today Comorbid factors increasing risk of overdose: See initial evaluation. No additional risks detected today Opioid risk tool (ORT):  Opioid Risk  12/10/2017  Alcohol 0  Illegal Drugs 0  Rx Drugs 0  Alcohol 0  Illegal Drugs 0  Rx Drugs 0  Age  between 16-45 years  0  History of Preadolescent Sexual Abuse 0  Psychological Disease 0  Depression 0  Opioid Risk Tool Scoring 0  Opioid Risk Interpretation Low Risk    ORT Scoring interpretation table:  Score <3 = Low Risk for SUD  Score between 4-7 = Moderate Risk for SUD  Score >8 = High Risk for Opioid Abuse   Risk of substance use disorder (SUD): Low  Risk Mitigation Strategies:  Patient Counseling: Covered Patient-Prescriber Agreement (PPA): Present and active  Notification to other healthcare providers: Done  Pharmacologic Plan: No change in therapy, at this time.             Assessment   Status Diagnosis  Controlled Controlled Controlled 1. Chronic pain syndrome   2. Cancer-related pain   3. DDD (degenerative disc disease), lumbar   4. Failed back surgical syndrome   5. Lumbar spondylosis   6. Presence of implanted infusion pump   7. Adjustment and management of infusion pump   8. Pharmacologic therapy      Updated Problems: Problem  Atrial Fibrillation With Rapid Ventricular Response (Hcc)  Hypothyroidism  Tobacco Use Disorder    Plan of Care   Imaging Orders  No imaging studies ordered today   Procedure Orders    No procedure(s) ordered today    Medications ordered for procedure: No orders of the defined types were placed in this encounter.  Medications administered: Darrell White had no medications administered during this visit.  See the medical record for exact dosing, route, and time of administration.  Disposition: Discharge home  Discharge Date & Time: 03/19/2018;   hrs.   Physician-requested Follow-up: No follow-ups on file.  Future Appointments  Date Time Provider [place]  03/19/2018 11:00 AM Milinda Pointer, MD [facility] None  03/25/2018  9:00 AM [facility] None  03/25/2018 10:00 AM  AP-CT 1 [facility]CT [place] [facility]  03/27/2018 11:20 AM Higgs, Mathis Dad, MD [facility] None   Primary Care Physician: [name],  MD Location: [facility] Note by: Gaspar Cola, MD Date: 03/19/2018; Time: 7:39 AM  Disclaimer:  Medicine is not an exact science. The only guarantee in medicine is that nothing is guaranteed. It is important to note that the decision to proceed with this intervention was based on the information collected from the patient. The Data and conclusions were drawn from the patient's questionnaire, the interview, and the physical examination. Because the information was provided in large part by the patient, it cannot be guaranteed that it has not been purposely or unconsciously manipulated. Every effort has been made to obtain as much relevant data as possible for this evaluation. It is important to note that the conclusions that lead to this procedure are derived in large part from the available data. Always take into account that the treatment will also be dependent on availability of resources and existing treatment guidelines, considered by other Pain Management Practitioners as being common knowledge and practice, at the time of the intervention. For Medico-Legal purposes, it is also important to point out that variation in procedural techniques and pharmacological choices are the acceptable norm. The indications, contraindications, technique, and results of the above procedure should only be interpreted and judged by a Board-Certified Interventional Pain Specialist with extensive familiarity and expertise in the same exact procedure and technique.

## 2018-03-20 DIAGNOSIS — I4892 Unspecified atrial flutter: Secondary | ICD-10-CM | POA: Insufficient documentation

## 2018-03-21 DIAGNOSIS — I4892 Unspecified atrial flutter: Secondary | ICD-10-CM | POA: Diagnosis not present

## 2018-03-21 DIAGNOSIS — I4819 Other persistent atrial fibrillation: Secondary | ICD-10-CM | POA: Diagnosis not present

## 2018-03-21 DIAGNOSIS — E785 Hyperlipidemia, unspecified: Secondary | ICD-10-CM | POA: Diagnosis not present

## 2018-03-24 ENCOUNTER — Other Ambulatory Visit: Payer: Self-pay

## 2018-03-24 ENCOUNTER — Telehealth: Payer: Self-pay | Admitting: Pain Medicine

## 2018-03-24 MED ORDER — PAIN MANAGEMENT IT PUMP REFILL: 1.0000 | Freq: Once | INTRATHECAL | 0 refills | Status: AC

## 2018-03-24 MED ORDER — PAIN MANAGEMENT IT PUMP REFILL: 1.0000 | Freq: Once | INTRATHECAL | 0 refills | Status: DC

## 2018-03-24 NOTE — Telephone Encounter (Signed)
Patient lvmail stating he has been hospitalized last 3 weeks. Needed reschedule for pump refill. I set him up for Thursday 2-20 at 11:00 am. Patient has been notified of appt.

## 2018-03-25 ENCOUNTER — Ambulatory Visit (HOSPITAL_COMMUNITY): Payer: PPO

## 2018-03-25 ENCOUNTER — Inpatient Hospital Stay (HOSPITAL_COMMUNITY): Payer: PPO

## 2018-03-25 NOTE — Telephone Encounter (Signed)
Per Evette Georges has contacted [facility] to overnight meds for Thurs refill

## 2018-03-26 ENCOUNTER — Ambulatory Visit: Payer: PPO | Attending: Pain Medicine | Admitting: Pain Medicine

## 2018-03-26 ENCOUNTER — Other Ambulatory Visit: Payer: Self-pay

## 2018-03-26 ENCOUNTER — Encounter: Payer: Self-pay | Admitting: Pain Medicine

## 2018-03-26 VITALS — BP 139/76 | HR 49 | Temp 97.3°F | Resp 16 | Ht 68.0 in | Wt 165.0 lb

## 2018-03-26 DIAGNOSIS — Z451 Encounter for adjustment and management of infusion pump: Secondary | ICD-10-CM | POA: Insufficient documentation

## 2018-03-26 DIAGNOSIS — Z95828 Presence of other vascular implants and grafts: Secondary | ICD-10-CM

## 2018-03-26 DIAGNOSIS — G893 Neoplasm related pain (acute) (chronic): Secondary | ICD-10-CM | POA: Insufficient documentation

## 2018-03-26 DIAGNOSIS — M79605 Pain in left leg: Secondary | ICD-10-CM | POA: Diagnosis not present

## 2018-03-26 DIAGNOSIS — M79604 Pain in right leg: Secondary | ICD-10-CM | POA: Insufficient documentation

## 2018-03-26 DIAGNOSIS — Z79899 Other long term (current) drug therapy: Secondary | ICD-10-CM | POA: Diagnosis not present

## 2018-03-26 DIAGNOSIS — M961 Postlaminectomy syndrome, not elsewhere classified: Secondary | ICD-10-CM

## 2018-03-26 DIAGNOSIS — G894 Chronic pain syndrome: Secondary | ICD-10-CM | POA: Diagnosis not present

## 2018-03-26 DIAGNOSIS — M5442 Lumbago with sciatica, left side: Secondary | ICD-10-CM | POA: Diagnosis not present

## 2018-03-26 DIAGNOSIS — G8929 Other chronic pain: Secondary | ICD-10-CM | POA: Insufficient documentation

## 2018-03-26 DIAGNOSIS — Z79891 Long term (current) use of opiate analgesic: Secondary | ICD-10-CM

## 2018-03-26 DIAGNOSIS — M5441 Lumbago with sciatica, right side: Secondary | ICD-10-CM | POA: Insufficient documentation

## 2018-03-26 MED ORDER — OXYCODONE HCL 10 MG PO TABS: 10.0000 mg | ORAL_TABLET | Freq: Four times a day (QID) | ORAL | 0 refills | Status: DC | PRN

## 2018-03-26 MED FILL — Medication: INTRATHECAL | Qty: 1 | Status: AC

## 2018-03-26 NOTE — Patient Instructions (Signed)
Three prescriptions for Oxycodone were sent to your pharmacy.

## 2018-03-26 NOTE — Progress Notes (Signed)
Nursing Pain Medication Assessment:  Safety precautions to be maintained throughout the outpatient stay will include: orient to surroundings, keep bed in low position, maintain call bell within reach at all times, provide assistance with transfer out of bed and ambulation.  Medication Inspection Compliance: Pill count conducted under aseptic conditions, in front of the patient. Neither the pills nor the bottle was removed from the patient's sight at any time. Once count was completed pills were immediately returned to the patient in their original bottle.  Medication: Oxycodone IR Pill/Patch Count: 3 of 120 pills remain Pill/Patch Appearance: Markings consistent with prescribed medication Bottle Appearance: Standard pharmacy container. Clearly labeled. Filled Date: 01/16 / 2020 Last Medication intake:  Today

## 2018-03-26 NOTE — Patient Outreach (Signed)
[facility]) Care Management  03/26/2018  MALAKHI MARKWOOD 15-Aug-1950 [id number]   Member d/c Salome Holmes 03/22/2018.  Multiple attempts to establish contact with patient without success. No response from letter mailed to patient.   Plan: RN CM will close case at this time.   Jone Baseman, RN, MSN [place] Management Care Management Coordinator Direct Line [phone] Cell [phone] Toll Free: [phone]  Fax: [phone]

## 2018-03-26 NOTE — Progress Notes (Signed)
Patient's Name: Darrell White  MRN: [id number]  Referring Provider: [name], MD  DOB: 19-Nov-1950  PCP: [name], MD  DOS: 03/26/2018  Note by: Gaspar Cola, MD  Service setting: Ambulatory outpatient  Specialty: Interventional Pain Management  Patient type: Established  Location: [facility] (AMB) Pain Management Facility  Visit type: Interventional Procedure   Primary Reason for Visit: Interventional Pain Management Treatment. CC: Back Pain (lower)  Procedure:          Intrathecal Drug Delivery System (IDDS):  Type: Reservoir Refill [phone]) No rate change Region: Abdominal Laterality: Right  Type of Pump: Medtronic Synchromed II (MRI-compatible) Delivery Route: Intrathecal Type of Pain Treated: Neuropathic/Nociceptive Primary Medication Class: Opioid/opiate  Medication, Concentration, Infusion Program, & Delivery Rate: Please see scanned programming printout.   Indications: 1. Chronic pain syndrome   2. Cancer-related pain   3. Failed back surgical syndrome   4. Chronic low back pain (Primary Area of Pain) (Bilateral) (R>L)   5. Chronic lower extremity pain (Secondary Area of Pain) (Bilateral) (R>L)   6. Presence of implanted infusion pump   7. Long term (current) use of opiate analgesic   8. Adjustment and management of infusion pump    Pain Assessment: Self-Reported Pain Score: 5 /10             Reported level is compatible with observation.        Intrathecal Pump Therapy Assessment  Manufacturer: Medtronic Synchromed Type: Programmable Volume: 40 mL reservoir MRI compatibility: Yes   Drug content:  Primary Medication Class: Opioid Primary Medication: PF-Morphine (20 mg/mL) Secondary Medication: PF-Bupivacaine (20 mg/mL) Other Medication: No third medication   Programming:  Type: Simple continuous. See pump readout for details.   Changes:  Medication Change: None at this point Rate Change: No change in rate  Reported side-effects or adverse  reactions: None reported  Effectiveness: Described as relatively effective, allowing for increase in activities of daily living (ADL) Clinically meaningful improvement in function (CMIF): Sustained CMIF goals met  Plan: Pump refill today  Pre-op Assessment:  Darrell White is a 68 y.o. (year old), male patient, seen today for interventional treatment. He  has a past surgical history that includes Mouth surgery; Cervical disc surgery; Shoulder arthroscopy; Back surgery; Spinal fusion; Spine surgery; Knee arthroscopy; Cataract extraction w/PHACO (Left, 04/09/2012); Direct laryngoscopy (N/A, 03/26/2016); Excision of tongue lesion (Right, 03/26/2016); OTHER SURGICAL HISTORY; and CT LUNG SCREENING (06/16/2017). Darrell White has a current medication list which includes the following prescription(s): atorvastatin, gabapentin, levothyroxine, omeprazole, sotalol, tiotropium, trazodone, acetaminophen, docusate calcium, mirtazapine, oxycodone hcl, oxycodone hcl, and oxycodone hcl. His primarily concern today is the Back Pain (lower)  Initial Vital Signs:  Pulse/HCG Rate: (!) 49  Temp: (!) 97.3 F (36.3 C) Resp: 16 BP: 139/76 SpO2: 98 %  BMI: Estimated body mass index is 25.09 kg/m as calculated from the following:   Height as of this encounter: _0  (1.727 m).   Weight as of this encounter: 165 lb (74.8 kg).  Risk Assessment: Allergies: Reviewed. He has No Known Allergies.  Allergy Precautions: None required Coagulopathies: Reviewed. None identified.  Blood-thinner therapy: None at this time Active Infection(s): Reviewed. None identified. Darrell White is afebrile  Site Confirmation: Darrell White was asked to confirm the procedure and laterality before marking the site Procedure checklist: Completed Consent: Before the procedure and under the influence of no sedative(s), amnesic(s), or anxiolytics, the patient was informed of the treatment options, risks and possible complications. To fulfill our ethical and  legal obligations, as recommended by the American Medical Association's Code of Ethics, I have informed the patient of my clinical impression; the nature and purpose of the treatment or procedure; the risks, benefits, and possible complications of the intervention; the alternatives, including doing nothing; the risk(s) and benefit(s) of the alternative treatment(s) or procedure(s); and the risk(s) and benefit(s) of doing nothing.  Darrell White was provided with information about the general risks and possible complications associated with most interventional procedures. These include, but are not limited to: failure to achieve desired goals, infection, bleeding, organ or nerve damage, allergic reactions, paralysis, and/or death.  In addition, he was informed of those risks and possible complications associated to this particular procedure, which include, but are not limited to: damage to the implant; failure to decrease pain; local, systemic, or serious CNS infections, intraspinal abscess with possible cord compression and paralysis, or life-threatening such as meningitis; bleeding; organ damage; nerve injury or damage with subsequent sensory, motor, and/or autonomic system dysfunction, resulting in transient or permanent pain, numbness, and/or weakness of one or several areas of the body; allergic reactions, either minor or major life-threatening, such as anaphylactic or anaphylactoid reactions.  Furthermore, Darrell White was informed of those risks and complications associated with the medications. These include, but are not limited to: allergic reactions (i.e.: anaphylactic or anaphylactoid reactions); endorphine suppression; bradycardia and/or hypotension; water retention and/or peripheral vascular relaxation leading to lower extremity edema and possible stasis ulcers; respiratory depression and/or shortness of breath; decreased metabolic rate leading to weight gain; swelling or edema; medication-induced neural  toxicity; particulate matter embolism and blood vessel occlusion with resultant organ, and/or nervous system infarction; and/or intrathecal granuloma formation with possible spinal cord compression and permanent paralysis.  Before refilling the pump Mr. Bidinger was informed that some of the medications used in the devise may not be FDA approved for such use and therefore it constitutes an off-label use of the medications.  Finally, he was informed that Medicine is not an exact science; therefore, there is also the possibility of unforeseen or unpredictable risks and/or possible complications that may result in a catastrophic outcome. The patient indicated having understood very clearly. We have given the patient no guarantees and we have made no promises. Enough time was given to the patient to ask questions, all of which were answered to the patient's satisfaction. Mr. Tuminello has indicated that he wanted to continue with the procedure. Attestation: I, the ordering provider, attest that I have discussed with the patient the benefits, risks, side-effects, alternatives, likelihood of achieving goals, and potential problems during recovery for the procedure that I have provided informed consent. Date  Time: 03/26/2018 10:53 AM  Pre-Procedure Preparation:  Monitoring: As per clinic protocol. Respiration, ETCO2, SpO2, BP, heart rate and rhythm monitor placed and checked for adequate function Safety Precautions: Patient was assessed for positional comfort and pressure points before starting the procedure. Time-out: I initiated and conducted the "Time-out" before starting the procedure, as per protocol. The patient was asked to participate by confirming the accuracy of the "Time Out" information. Verification of the correct person, site, and procedure were performed and confirmed by me, the nursing staff, and the patient. "Time-out" conducted as per Joint Commission's Universal Protocol (UP.01.01.01). Time:  1101  Description of Procedure:          Position: Supine Target Area: Central-port of intrathecal pump. Approach: Anterior, 90 degree angle approach. Area Prepped: Entire Area around the pump implant. Prepping solution: ChloraPrep (2% chlorhexidine gluconate and 70%  isopropyl alcohol) Safety Precautions: Aspiration looking for blood return was conducted prior to all injections. At no point did we inject any substances, as a needle was being advanced. No attempts were made at seeking any paresthesias. Safe injection practices and needle disposal techniques used. Medications properly checked for expiration dates. SDV (single dose vial) medications used. Description of the Procedure: Protocol guidelines were followed. Two nurses trained to do implant refills were present during the entire procedure. The refill medication was checked by both healthcare providers as well as the patient. The patient was included in the "Time-out" to verify the medication. The patient was placed in position. The pump was identified. The area was prepped in the usual manner. The sterile template was positioned over the pump, making sure the side-port location matched that of the pump. Both, the pump and the template were held for stability. The needle provided in the Medtronic Kit was then introduced thru the center of the template and into the central port. The pump content was aspirated and discarded volume documented. The new medication was slowly infused into the pump, thru the filter, making sure to avoid overpressure of the device. The needle was then removed and the area cleansed, making sure to leave some of the prepping solution back to take advantage of its long term bactericidal properties. The pump was interrogated and programmed to reflect the correct medication, volume, and dosage. The program was printed and taken to the physician for approval. Once checked and signed by the physician, a copy was provided to the  patient and another scanned into the EMR. Vitals:   03/26/18 1050  BP: 139/76  Pulse: (!) 49  Resp: 16  Temp: (!) 97.3 F (36.3 C)  TempSrc: Oral  SpO2: 98%  Weight: 165 lb (74.8 kg)  Height: _0  (1.727 m)    Start Time: 1101 hrs. End Time: 1108 hrs. Materials & Medications: Medtronic Refill Kit Medication(s): Please see chart orders for details.  Imaging Guidance:          Type of Imaging Technique: None used Indication(s): N/A Exposure Time: No patient exposure Contrast: None used. Fluoroscopic Guidance: N/A Ultrasound Guidance: N/A Interpretation: N/A  Antibiotic Prophylaxis:   Anti-infectives (From admission, onward)   None     Indication(s): None identified  Post-operative Assessment:  Post-procedure Vital Signs:  Pulse/HCG Rate: (!) 49  Temp: (!) 97.3 F (36.3 C) Resp: 16 BP: 139/76 SpO2: 98 %  EBL: None  Complications: No immediate post-treatment complications observed by team, or reported by patient.  Note: The patient tolerated the entire procedure well. A repeat set of vitals were taken after the procedure and the patient was kept under observation following institutional policy, for this type of procedure. Post-procedural neurological assessment was performed, showing return to baseline, prior to discharge. The patient was provided with post-procedure discharge instructions, including a section on how to identify potential problems. Should any problems arise concerning this procedure, the patient was given instructions to immediately contact us, at any time, without hesitation. In any case, we plan to contact the patient by telephone for a follow-up status report regarding this interventional procedure.  Comments:  No additional relevant information.  Controlled Substance Pharmacotherapy Assessment REMS (Risk Evaluation and Mitigation Strategy)  Analgesic: Oxycodone IR 10 mg 1 tablet p.o. every 6 hours (40 mg/day of oxycodone) MME/day: 60 mg/day.   Landis Martins, RN  03/26/2018 11:11 AM  Sign when Signing Visit Nursing Pain Medication Assessment:  Safety precautions to be maintained  throughout the outpatient stay will include: orient to surroundings, keep bed in low position, maintain call bell within reach at all times, provide assistance with transfer out of bed and ambulation.  Medication Inspection Compliance: Pill count conducted under aseptic conditions, in front of the patient. Neither the pills nor the bottle was removed from the patient's sight at any time. Once count was completed pills were immediately returned to the patient in their original bottle.  Medication: Oxycodone IR Pill/Patch Count: 3 of 120 pills remain Pill/Patch Appearance: Markings consistent with prescribed medication Bottle Appearance: Standard pharmacy container. Clearly labeled. Filled Date: 01/16 / 2020 Last Medication intake:  Today   Pharmacokinetics: Liberation and absorption (onset of action): WNL Distribution (time to peak effect): WNL Metabolism and excretion (duration of action): WNL         Pharmacodynamics: Desired effects: Analgesia: Mr. Dotzler reports >50% benefit. Functional ability: Patient reports that medication allows him to accomplish basic ADLs Clinically meaningful improvement in function (CMIF): Sustained CMIF goals met Perceived effectiveness: Described as relatively effective, allowing for increase in activities of daily living (ADL) Undesirable effects: Side-effects or Adverse reactions: None reported Monitoring: [place] PMP: Online review of the past 6-monthperiod conducted. Compliant with practice rules and regulations Last UDS on record: Summary  Date Value Ref Range Status  10/16/2016 FINAL  Final    Comment:    ==================================================================== [facility] COMP DRUG ANALYSIS,UR ==================================================================== Test                             Result        Flag       Units Drug Present and Declared for Prescription Verification   Oxycodone                      >3534        EXPECTED   ng/mg creat   Oxymorphone                    3447         EXPECTED   ng/mg creat   Noroxycodone                   >3534        EXPECTED   ng/mg creat   Noroxymorphone                 701          EXPECTED   ng/mg creat    Sources of oxycodone are scheduled prescription medications.    Oxymorphone, noroxycodone, and noroxymorphone are expected    metabolites of oxycodone. Oxymorphone is also available as a    scheduled prescription medication.   Gabapentin                     PRESENT      EXPECTED   Mirtazapine                    PRESENT      EXPECTED   Trazodone                      PRESENT      EXPECTED   1,3 chlorophenyl piperazine    PRESENT      EXPECTED    1,3-chlorophenyl piperazine is an expected metabolite of    trazodone.   Metoprolol  PRESENT      EXPECTED Drug Present not Declared for Prescription Verification   Morphine                       1194         UNEXPECTED ng/mg creat   Normorphine                    26           UNEXPECTED ng/mg creat    Potential sources of large amounts of morphine in the absence of    codeine include administration of morphine or use of heroin.    Normorphine is an expected metabolite of morphine. Drug Absent but Declared for Prescription Verification   Salicylate                     Not Detected UNEXPECTED    Aspirin, as indicated in the declared medication list, is not    always detected even when used as directed. ==================================================================== Test                      Result    Flag   Units      Ref Range   Creatinine              283              mg/dL      >=20 ==================================================================== Declared Medications:  The flagging and interpretation on this report are based on the  following declared medications.   Unexpected results may arise from  inaccuracies in the declared medications.  **Note: The testing scope of this panel includes these medications:  Gabapentin  Metoprolol  Mirtazapine  Oxycodone  Trazodone  **Note: The testing scope of this panel does not include small to  moderate amounts of these reported medications:  Aspirin (Aspirin 81)  **Note: The testing scope of this panel does not include following  reported medications:  Atorvastatin  Cyanocobalamin  Docusate  Levothyroxine  Omeprazole ==================================================================== For clinical consultation, please call [phone]. ====================================================================    UDS interpretation: Compliant          Medication Assessment Form: Reviewed. Patient indicates being compliant with therapy Treatment compliance: Compliant Risk Assessment Profile: Aberrant behavior: See initial evaluations. None observed or detected today Comorbid factors increasing risk of overdose: See initial evaluation. No additional risks detected today Opioid risk tool (ORT):  Opioid Risk  12/10/2017  Alcohol 0  Illegal Drugs 0  Rx Drugs 0  Alcohol 0  Illegal Drugs 0  Rx Drugs 0  Age between 16-45 years  0  History of Preadolescent Sexual Abuse 0  Psychological Disease 0  Depression 0  Opioid Risk Tool Scoring 0  Opioid Risk Interpretation Low Risk    ORT Scoring interpretation table:  Score <3 = Low Risk for SUD  Score between 4-7 = Moderate Risk for SUD  Score >8 = High Risk for Opioid Abuse   Risk of substance use disorder (SUD): Low  Risk Mitigation Strategies:  Patient Counseling: Covered Patient-Prescriber Agreement (PPA): Present and active  Notification to other healthcare providers: Done  Pharmacologic Plan: No change in therapy, at this time.             Assessment   Status Diagnosis  Controlled Controlled Controlled 1. Chronic pain syndrome   2.  Cancer-related pain   3. Failed back surgical syndrome   4. Chronic low  back pain (Primary Area of Pain) (Bilateral) (R>L)   5. Chronic lower extremity pain (Secondary Area of Pain) (Bilateral) (R>L)   6. Presence of implanted infusion pump   7. Long term (current) use of opiate analgesic   8. Adjustment and management of infusion pump   9. Pharmacologic therapy      Updated Problems: No problems updated.  Plan of Care   Imaging Orders  No imaging studies ordered today    Procedure Orders     PUMP REFILL     PUMP REFILL  Medications ordered for procedure: Meds ordered this encounter  Medications  . Oxycodone HCl 10 MG TABS    Sig: Take 1 tablet (10 mg total) by mouth every 6 (six) hours as needed for up to 30 days.    Dispense:  120 tablet    Refill:  0    [place] STOP ACT - Not applicable to Chronic Pain Syndrome (G89.4) diagnosis. Fill one day early if pharmacy is closed on scheduled refill date. Do not fill until: 05/25/18. To last until: 06/24/18.  Marland Kitchen Oxycodone HCl 10 MG TABS    Sig: Take 1 tablet (10 mg total) by mouth every 6 (six) hours as needed for up to 30 days.    Dispense:  120 tablet    Refill:  0    [place] STOP ACT - Not applicable to Chronic Pain Syndrome (G89.4) diagnosis. Fill one day early if pharmacy is closed on scheduled refill date. Do not fill until: 04/25/18. To last until: 05/25/18.  Marland Kitchen Oxycodone HCl 10 MG TABS    Sig: Take 1 tablet (10 mg total) by mouth every 6 (six) hours as needed for up to 30 days.    Dispense:  120 tablet    Refill:  0    [place] STOP ACT - Not applicable to Chronic Pain Syndrome (G89.4) diagnosis. Fill one day early if pharmacy is closed on scheduled refill date. Do not fill until: 03/26/18. To last until: 04/25/18.   Medications administered: Sabas Sous had no medications administered during this visit.  See the medical record for exact dosing, route, and time of administration.  Disposition: Discharge home  Discharge Date & Time:  03/26/2018; 1130 hrs.   Physician-requested Follow-up: Return for Pump Refill (as per pump program) (Max:14mo.  Future Appointments  Date Time Provider [place] 06/23/2018 11:15 AM [facility]ilinda Pointer [facility]   Primary Care Physician: SManon Hilding MD [facility] Note by: FGaspar Cola MD Date: 03/26/2018; Time: 12:03 PM  Disclaimer:  Medicine is not an eChief Strategy Officer The only guarantee in medicine is that nothing is guaranteed. It is important to note that the decision to proceed with this intervention was based on the information collected from the patient. The Data and conclusions were drawn from the patient's questionnaire, the interview, and the physical examination. Because the information was provided in large part by the patient, it cannot be guaranteed that it has not been purposely or unconsciously manipulated. Every effort has been made to obtain as much relevant data as possible for this evaluation. It is important to note that the conclusions that lead to this procedure are derived in large part from the available data. Always take into account that the treatment will also be dependent on availability of resources and existing treatment guidelines, considered by other Pain Management Practitioners as being common knowledge and practice, at the time of the intervention. For Medico-Legal purposes, it is  also important to point out that variation in procedural techniques and pharmacological choices are the acceptable norm. The indications, contraindications, technique, and results of the above procedure should only be interpreted and judged by a Board-Certified Interventional Pain Specialist with extensive familiarity and expertise in the same exact procedure and technique.

## 2018-03-27 ENCOUNTER — Ambulatory Visit (HOSPITAL_COMMUNITY): Payer: PPO | Admitting: Internal Medicine

## 2018-03-27 ENCOUNTER — Telehealth: Payer: Self-pay

## 2018-03-27 NOTE — Telephone Encounter (Signed)
Post procedure phone call. Patient states he is doing well.  

## 2018-03-30 LAB — TOXASSURE SELECT 13 (MW), URINE

## 2018-04-06 ENCOUNTER — Inpatient Hospital Stay (HOSPITAL_COMMUNITY): Payer: PPO | Attending: Hematology

## 2018-04-06 ENCOUNTER — Other Ambulatory Visit: Payer: Self-pay

## 2018-04-06 DIAGNOSIS — F17210 Nicotine dependence, cigarettes, uncomplicated: Secondary | ICD-10-CM | POA: Diagnosis not present

## 2018-04-06 DIAGNOSIS — Z7901 Long term (current) use of anticoagulants: Secondary | ICD-10-CM | POA: Insufficient documentation

## 2018-04-06 DIAGNOSIS — I10 Essential (primary) hypertension: Secondary | ICD-10-CM | POA: Diagnosis not present

## 2018-04-06 DIAGNOSIS — Z923 Personal history of irradiation: Secondary | ICD-10-CM | POA: Insufficient documentation

## 2018-04-06 DIAGNOSIS — K219 Gastro-esophageal reflux disease without esophagitis: Secondary | ICD-10-CM | POA: Diagnosis not present

## 2018-04-06 DIAGNOSIS — M129 Arthropathy, unspecified: Secondary | ICD-10-CM | POA: Insufficient documentation

## 2018-04-06 DIAGNOSIS — Z79899 Other long term (current) drug therapy: Secondary | ICD-10-CM | POA: Insufficient documentation

## 2018-04-06 DIAGNOSIS — I2510 Atherosclerotic heart disease of native coronary artery without angina pectoris: Secondary | ICD-10-CM | POA: Diagnosis not present

## 2018-04-06 DIAGNOSIS — F329 Major depressive disorder, single episode, unspecified: Secondary | ICD-10-CM | POA: Insufficient documentation

## 2018-04-06 DIAGNOSIS — J439 Emphysema, unspecified: Secondary | ICD-10-CM | POA: Diagnosis not present

## 2018-04-06 DIAGNOSIS — G629 Polyneuropathy, unspecified: Secondary | ICD-10-CM | POA: Diagnosis not present

## 2018-04-06 DIAGNOSIS — Z85810 Personal history of malignant neoplasm of tongue: Secondary | ICD-10-CM | POA: Diagnosis not present

## 2018-04-06 DIAGNOSIS — Z8042 Family history of malignant neoplasm of prostate: Secondary | ICD-10-CM | POA: Diagnosis not present

## 2018-04-06 DIAGNOSIS — I700 Atherosclerosis of aorta: Secondary | ICD-10-CM | POA: Insufficient documentation

## 2018-04-06 DIAGNOSIS — E7800 Pure hypercholesterolemia, unspecified: Secondary | ICD-10-CM | POA: Diagnosis not present

## 2018-04-06 DIAGNOSIS — C3411 Malignant neoplasm of upper lobe, right bronchus or lung: Secondary | ICD-10-CM | POA: Insufficient documentation

## 2018-04-06 DIAGNOSIS — Z809 Family history of malignant neoplasm, unspecified: Secondary | ICD-10-CM | POA: Diagnosis not present

## 2018-04-06 DIAGNOSIS — E039 Hypothyroidism, unspecified: Secondary | ICD-10-CM | POA: Diagnosis not present

## 2018-04-06 DIAGNOSIS — C029 Malignant neoplasm of tongue, unspecified: Secondary | ICD-10-CM

## 2018-04-06 LAB — COMPREHENSIVE METABOLIC PANEL
ALT: 18 U/L (ref 0–44)
AST: 17 U/L (ref 15–41)
Albumin: 3.8 g/dL (ref 3.5–5.0)
Alkaline Phosphatase: 37 U/L — ABNORMAL LOW (ref 38–126)
Anion gap: 8 (ref 5–15)
BUN: 14 mg/dL (ref 8–23)
CALCIUM: 9 mg/dL (ref 8.9–10.3)
CO2: 27 mmol/L (ref 22–32)
Chloride: 102 mmol/L (ref 98–111)
Creatinine, Ser: 0.94 mg/dL (ref 0.61–1.24)
GFR calc Af Amer: >60 mL/min (ref >60)
GFR calc non Af Amer: >60 mL/min (ref >60)
Glucose, Bld: 99 mg/dL (ref 70–99)
Potassium: 4.5 mmol/L (ref 3.5–5.1)
Sodium: 137 mmol/L (ref 135–145)
Total Bilirubin: 0.7 mg/dL (ref 0.3–1.2)
Total Protein: 6.9 g/dL (ref 6.5–8.1)

## 2018-04-06 LAB — CBC WITH DIFFERENTIAL/PLATELET
Abs Immature Granulocytes: 0 10*3/uL (ref 0.00–0.07)
Basophils Absolute: 0.1 10*3/uL (ref 0.0–0.1)
Basophils Relative: 2 %
Eosinophils Absolute: 0.4 10*3/uL (ref 0.0–0.5)
Eosinophils Relative: 11 %
HEMATOCRIT: 43 % (ref 39.0–52.0)
Hemoglobin: 13.6 g/dL (ref 13.0–17.0)
Immature Granulocytes: 0 %
Lymphocytes Relative: 25 %
Lymphs Abs: 1 10*3/uL (ref 0.7–4.0)
MCH: 29.4 pg (ref 26.0–34.0)
MCHC: 31.6 g/dL (ref 30.0–36.0)
MCV: 93.1 fL (ref 80.0–100.0)
Monocytes Absolute: 0.4 10*3/uL (ref 0.1–1.0)
Monocytes Relative: 11 %
Neutro Abs: 2 10*3/uL (ref 1.7–7.7)
Neutrophils Relative %: 51 %
Platelets: 197 10*3/uL (ref 150–400)
RBC: 4.62 MIL/uL (ref 4.22–5.81)
RDW: 15.1 % (ref 11.5–15.5)
WBC: 3.9 10*3/uL — ABNORMAL LOW (ref 4.0–10.5)
nRBC: 0 % (ref 0.0–0.2)

## 2018-04-06 LAB — LACTATE DEHYDROGENASE: LDH: 169 U/L (ref 98–192)

## 2018-04-06 LAB — TSH: TSH: 4.318 u[IU]/mL (ref 0.350–4.500)

## 2018-04-08 DIAGNOSIS — I4891 Unspecified atrial fibrillation: Secondary | ICD-10-CM | POA: Diagnosis not present

## 2018-04-09 DIAGNOSIS — I4891 Unspecified atrial fibrillation: Secondary | ICD-10-CM | POA: Diagnosis not present

## 2018-04-09 DIAGNOSIS — Z6823 Body mass index (BMI) 23.0-23.9, adult: Secondary | ICD-10-CM | POA: Diagnosis not present

## 2018-04-20 ENCOUNTER — Ambulatory Visit (HOSPITAL_COMMUNITY)
Admission: RE | Admit: 2018-04-20 | Discharge: 2018-04-20 | Disposition: A | Discharge status: Home / Self Care | Payer: PPO | Source: Ambulatory Visit | Attending: Nurse Practitioner | Admitting: Nurse Practitioner

## 2018-04-20 ENCOUNTER — Other Ambulatory Visit: Payer: Self-pay

## 2018-04-20 DIAGNOSIS — R590 Localized enlarged lymph nodes: Secondary | ICD-10-CM | POA: Diagnosis not present

## 2018-04-20 DIAGNOSIS — R918 Other nonspecific abnormal finding of lung field: Secondary | ICD-10-CM | POA: Diagnosis not present

## 2018-04-20 DIAGNOSIS — C029 Malignant neoplasm of tongue, unspecified: Secondary | ICD-10-CM | POA: Diagnosis not present

## 2018-04-20 MED ORDER — IOHEXOL 300 MG/ML  SOLN
75.0000 mL | Freq: Once | INTRAMUSCULAR | Status: AC | PRN
  Administered 2018-04-20: 75 mL via INTRAVENOUS

## 2018-04-23 ENCOUNTER — Other Ambulatory Visit: Payer: Self-pay

## 2018-04-23 ENCOUNTER — Encounter (HOSPITAL_COMMUNITY): Payer: Self-pay | Admitting: Hematology

## 2018-04-23 ENCOUNTER — Inpatient Hospital Stay (HOSPITAL_BASED_OUTPATIENT_CLINIC_OR_DEPARTMENT_OTHER): Payer: PPO | Admitting: Hematology

## 2018-04-23 VITALS — BP 140/73 | HR 54 | Temp 97.9°F | Resp 18 | Wt 157.0 lb

## 2018-04-23 DIAGNOSIS — I700 Atherosclerosis of aorta: Secondary | ICD-10-CM

## 2018-04-23 DIAGNOSIS — E7800 Pure hypercholesterolemia, unspecified: Secondary | ICD-10-CM | POA: Diagnosis not present

## 2018-04-23 DIAGNOSIS — J439 Emphysema, unspecified: Secondary | ICD-10-CM | POA: Diagnosis not present

## 2018-04-23 DIAGNOSIS — Z7901 Long term (current) use of anticoagulants: Secondary | ICD-10-CM

## 2018-04-23 DIAGNOSIS — I2510 Atherosclerotic heart disease of native coronary artery without angina pectoris: Secondary | ICD-10-CM | POA: Diagnosis not present

## 2018-04-23 DIAGNOSIS — M129 Arthropathy, unspecified: Secondary | ICD-10-CM | POA: Diagnosis not present

## 2018-04-23 DIAGNOSIS — K219 Gastro-esophageal reflux disease without esophagitis: Secondary | ICD-10-CM | POA: Diagnosis not present

## 2018-04-23 DIAGNOSIS — Z85810 Personal history of malignant neoplasm of tongue: Secondary | ICD-10-CM | POA: Diagnosis not present

## 2018-04-23 DIAGNOSIS — I10 Essential (primary) hypertension: Secondary | ICD-10-CM

## 2018-04-23 DIAGNOSIS — F329 Major depressive disorder, single episode, unspecified: Secondary | ICD-10-CM | POA: Diagnosis not present

## 2018-04-23 DIAGNOSIS — G629 Polyneuropathy, unspecified: Secondary | ICD-10-CM

## 2018-04-23 DIAGNOSIS — Z79899 Other long term (current) drug therapy: Secondary | ICD-10-CM

## 2018-04-23 DIAGNOSIS — E039 Hypothyroidism, unspecified: Secondary | ICD-10-CM

## 2018-04-23 DIAGNOSIS — Z923 Personal history of irradiation: Secondary | ICD-10-CM

## 2018-04-23 DIAGNOSIS — Z8042 Family history of malignant neoplasm of prostate: Secondary | ICD-10-CM

## 2018-04-23 DIAGNOSIS — C3411 Malignant neoplasm of upper lobe, right bronchus or lung: Secondary | ICD-10-CM

## 2018-04-23 DIAGNOSIS — C029 Malignant neoplasm of tongue, unspecified: Secondary | ICD-10-CM

## 2018-04-23 DIAGNOSIS — F17210 Nicotine dependence, cigarettes, uncomplicated: Secondary | ICD-10-CM

## 2018-04-23 DIAGNOSIS — Z809 Family history of malignant neoplasm, unspecified: Secondary | ICD-10-CM

## 2018-04-23 NOTE — Progress Notes (Signed)
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 Benjamine Mola (ENT) consult    03/14/2016 Procedure    Flex laryngoscopy-base of tongue with ulcerative mass in the right side.    03/26/2016 Procedure    Biopsy of right tongue by  Dr. Benjamine Mola    03/27/2016 Pathology Results    Invasive squamous cell carcinoma.    04/02/2016 Miscellaneous    Pathology contacted and requested HPV testing on malignancy specimen.    04/09/2016 Imaging    CT soft tissue neck: IMPRESSION: Enhancing mass base of tongue on the right measures 25 x 27 mm compatible with carcinoma. No pathologic adenopathy in the neck.  Extensive carotid atherosclerotic disease. There is significant carotid stenosis on the left. Carotid duplex or CTA recommended for further evaluation.    04/09/2016 Imaging    CT chest w contrast: IMPRESSION: 1. Irregular spiculated 2.0 cm apical right upper lobe pulmonary nodule, new since 07/31/2006 PET-CT, morphologically suspicious for malignancy, which could represent a metastasis versus a synchronous primary bronchogenic carcinoma. 2. Separate tiny 3 mm solid pulmonary nodule in the right upper lobe, also new since 2008, indeterminate metastasis. 3. Mild right hilar lymphadenopathy, cannot exclude nodal metastasis. 4. Consider PET-CT for further staging evaluation. 5. Mild radiation fibrosis in the medial apical upper lobes bilaterally. 6. Additional findings include aortic atherosclerosis, 2 vessel coronary atherosclerosis and mild centrilobular emphysema.    05/03/2016 PET scan    Hypermetabolic mass (R) base of tongue and lingular tonsil c/w H&N  cancer recurrence. No evidence of metastatic adenopathy in neck. Hypermetabolic nodule in RUL lung measuring 15 mm concerning for pulmonary mets vs primary lung carcinoma (favor primary lung cancer). Additional small sub-5 mm pulmonary nodules are favored to be benign.     04/2016 Pathology Results    Focal staining for p16 positive in approx 5-10% of squamous cells.     05/20/2016 Pathology Results    RUL lung mass biopsy: surgical path squamous cell carcinoma      CANCER STAGING: Cancer Staging Primary cancer of right upper lobe of lung ([facility]) Staging form: Lung, AJCC 7th Edition - Clinical stage from 06/05/2016: Stage IA (T1a, N0, M0) - Signed by [name], MD on 06/05/2016    INTERVAL HISTORY:  Mr. Vroom 68 y.o. male returns for routine follow-up. He is here today by himself. He states that he has bronchitis for 2 months, treated by PCP, antibiotics and steroids. Denies any nausea, vomiting, or diarrhea. Denies any new pains. Had not noticed any recent bleeding such as epistaxis, hematuria or hematochezia. Denies recent chest pain on exertion, shortness of breath on minimal exertion, pre-syncopal episodes, or palpitations. Denies any numbness or tingling in hands or feet. Denies any recent fevers, infections, or recent hospitalizations. Patient reports appetite at 75% and energy level at 75%.     REVIEW OF SYSTEMS:  Review of Systems  Respiratory: Positive for cough.      PAST MEDICAL/SURGICAL HISTORY:  Past Medical History:  Diagnosis Date  . Anxiety   . Arthritis   . Bronchitis 03/25/2017  . Cancer ([facility])    right base of tongue  .  Depression   . GERD (gastroesophageal reflux disease)   . HOH (hard of hearing)   . Hypercholesteremia   . Hypertension   . Hypothyroid   . Lung cancer ([facility])    right upper lobe non small cell lung cancer  . Neuropathy   . Squamous cell carcinoma of tongue ([facility]) 04/02/2016   Past Surgical History:  Procedure Laterality Date  . BACK  SURGERY     had total of 7 surgeries with rods and plates  . CATARACT EXTRACTION W/PHACO Left 04/09/2012   Procedure: CATARACT EXTRACTION PHACO AND INTRAOCULAR LENS PLACEMENT (I[facility]);  Surgeon: Tonny Branch, MD;  Location: [facility];  Service: Ophthalmology;  Laterality: Left;  CDE: 12.44  . CERVICAL DISC SURGERY    . CT LUNG SCREENING  06/16/2017  . DIRECT LARYNGOSCOPY N/A 03/26/2016   Procedure: DIRECT LARYNGOSCOPY;  Surgeon: Leta Baptist, MD;  Location: [facility];  Service: ENT;  Laterality: N/A;  . EXCISION OF TONGUE LESION Right 03/26/2016   Procedure: BIOPSY OF RIGHT TONGUE BASE MASS;  Surgeon: Leta Baptist, MD;  Location: [facility];  Service: ENT;  Laterality: Right;  . KNEE ARTHROSCOPY     right knee  . MOUTH SURGERY     removal of cancer  . OTHER SURGICAL HISTORY     insertion of pain pump  . SHOULDER ARTHROSCOPY     bilateral  . SPINAL FUSION    . SPINE SURGERY     insertion of morphine pump into spine     SOCIAL HISTORY:  Social History   Socioeconomic History  . Marital status: Married    Spouse name: Not on file  . Number of children: Not on file  . Years of education: Not on file  . Highest education level: Not on file  Occupational History  . Not on file  Social Needs  . Financial resource strain: Not on file  . Food insecurity:    Worry: Not on file    Inability: Not on file  . Transportation needs:    Medical: Not on file    Non-medical: Not on file  Tobacco Use  . Smoking status: Current Every Day Smoker    Packs/day: 1.00    Years: 55.00    Pack years: 55.00    Types: Cigarettes  . Smokeless tobacco: Never Used  Substance and Sexual Activity  . Alcohol use: No    Comment: H/O EtOHism drinking heavy- boubon/beer, quit ~ 2000  . Drug use: No  . Sexual activity: Yes    Birth control/protection: None  Lifestyle  . Physical activity:    Days per week: Not on file    Minutes per session: Not on file  . Stress: Not on file   Relationships  . Social connections:    Talks on phone: Not on file    Gets together: Not on file    Attends religious service: Not on file    Active member of club or organization: Not on file    Attends meetings of clubs or organizations: Not on file    Relationship status: Not on file  . Intimate partner violence:    Fear of current or ex partner: Not on file    Emotionally abused: Not on file    Physically abused: Not on file    Forced sexual activity: Not on file  Other Topics Concern  . Not on file  Social History Narrative  . Not on file    FAMILY HISTORY:  Family History  Problem Relation Age of Onset  . Cancer Father   . Cancer Brother        Prostate cancer    CURRENT MEDICATIONS:  Outpatient Encounter Medications as of 04/23/2018  Medication Sig  . acetaminophen (TYLENOL) 325 MG tablet Take 650 mg by mouth every 6 (six) hours as needed for mild pain.  Marland Kitchen albuterol (PROVENTIL HFA;VENTOLIN HFA) 108 (90 Base) MCG/ACT inhaler   . atorvastatin (LIPITOR) 20 MG tablet Take 80 mg by mouth daily.   Mariane Baumgarten Calcium (STOOL SOFTENER PO) Take 1 tablet by mouth daily as needed (constipation).  Marland Kitchen ELIQUIS 5 MG TABS tablet   . gabapentin (NEURONTIN) 300 MG capsule Take 300 mg by mouth 3 (three) times daily.   Marland Kitchen levothyroxine (SYNTHROID, LEVOTHROID) 75 MCG tablet Take 100 mcg by mouth daily before breakfast.   . mirtazapine (REMERON) 45 MG tablet Take 45 mg by mouth at bedtime.  Marland Kitchen omeprazole (PRILOSEC) 20 MG capsule Take 20 mg by mouth daily as needed (heartburn).  . Oxycodone HCl 10 MG TABS Take 1 tablet (10 mg total) by mouth every 6 (six) hours as needed for up to 30 days.  . sotalol (BETAPACE) 120 MG tablet Take 120 mg by mouth daily.  Marland Kitchen tiotropium (SPIRIVA) 18 MCG inhalation capsule Place 18 mcg into inhaler and inhale daily.  . traZODone (DESYREL) 50 MG tablet Take 200 mg by mouth at bedtime.   . [DISCONTINUED] Oxycodone HCl 10 MG TABS Take 1 tablet (10 mg total) by mouth  every 6 (six) hours as needed for up to 30 days.  . [DISCONTINUED] Oxycodone HCl 10 MG TABS Take 1 tablet (10 mg total) by mouth every 6 (six) hours as needed for up to 30 days.  . [DISCONTINUED] sotalol (BETAPACE) 120 MG tablet Take by mouth.   No facility-administered encounter medications on file as of 04/23/2018.     ALLERGIES:  No Known Allergies   PHYSICAL EXAM:  ECOG Performance status: 1  Vitals:   04/23/18 0909  BP: 140/73  Pulse: (!) 54  Resp: 18  Temp: 97.9 F (36.6 C)  SpO2: 96%   Filed Weights   04/23/18 0909  Weight: 157 lb (71.2 kg)    Physical Exam Constitutional:      Appearance: Normal appearance.  HENT:     Mouth/Throat:     Comments: No lesions on the tongue or buccal mucosa or tonsillar areas. Cardiovascular:     Rate and Rhythm: Normal rate and regular rhythm.     Heart sounds: Normal heart sounds.  Pulmonary:     Effort: Pulmonary effort is normal.     Breath sounds: Normal breath sounds.  Abdominal:     General: Bowel sounds are normal. There is no distension.     Palpations: Abdomen is soft.  Musculoskeletal:        General: No swelling.  Skin:    General: Skin is warm.  Neurological:     General: No focal deficit present.     Mental Status: He is alert and oriented to person, place, and time.  Psychiatric:        Mood and Affect: Mood normal.        Behavior: Behavior normal.      LABORATORY DATA:  I have reviewed the labs as listed.  CBC    Component Value Date/Time   WBC 3.9 (L) 04/06/2018 1251   RBC 4.62 04/06/2018 1251   HGB 13.6 04/06/2018 1251   HCT 43.0  04/06/2018 1251   PLT 197 04/06/2018 1251   MCV 93.1 04/06/2018 1251   MCH 29.4 04/06/2018 1251   MCHC 31.6 04/06/2018 1251   RDW 15.1 04/06/2018 1251   LYMPHSABS 1.0 04/06/2018 1251   MONOABS 0.4 04/06/2018 1251   EOSABS 0.4 04/06/2018 1251   BASOSABS 0.1 04/06/2018 1251   CMP Latest Ref Rng & Units 04/06/2018 09/18/2017 06/20/2017  Glucose 70 - 99 mg/dL 99  112(H) 145(H)  BUN 8 - 23 mg/dL 14 17 14   Creatinine 0.61 - 1.24 mg/dL 0.94 0.95 0.90  Sodium 135 - 145 mmol/L 137 139 136  Potassium 3.5 - 5.1 mmol/L 4.5 4.5 4.1  Chloride 98 - 111 mmol/L 102 103 101  CO2 22 - 32 mmol/L 27 28 28   Calcium 8.9 - 10.3 mg/dL 9.0 9.2 9.0  Total Protein 6.5 - 8.1 g/dL 6.9 7.0 7.0  Total Bilirubin 0.3 - 1.2 mg/dL 0.7 0.7 0.6  Alkaline Phos 38 - 126 U/L 37(L) 42 38  AST 15 - 41 U/L 17 20 22   ALT 0 - 44 U/L 18 12 11(L)       DIAGNOSTIC IMAGING:  I have independently reviewed the scans and discussed with the patient.   I have reviewed Venita Lick LPN's note and agree with the documentation.  I personally performed a face-to-face visit, made revisions and my assessment and plan is as follows.    ASSESSMENT & PLAN:   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 - Is able to eat well without any problems. -PET CT scan on 06/16/2017 did not show any evidence of recurrence. -He is continuing to smoke half pack of cigarettes per day.  I counseled to quit smoking.   2.  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  There is stable sub-5 mm right middle lobe and left lower lobe nodules. -CT of the chest on 09/18/2017 did not show any change in the right upper lobe nodule.  4 mm anterior right upper lobe nodule is also unchanged. - CT of the chest on 04/20/2018 shows evolving postradiation changes surrounding a stable appearing apical segment right upper lobe nodule.  Mediastinal lymph nodes have slightly increased in size by 3 mm.  Right upper lobe anterior segment nodule increased from 4 mm to 8 mm. -He reports that he has been bronchitis  for the last couple of months and is on and off antibiotics. -I have recommended repeating another scan in 3 to 4 months.  I have also recommended him to quit smoking.  3.  Hypothyroidism: -His TSH was elevated at last visit at 19.2.  He was told to take Synthroid 75 mcg daily without missing doses. - Today his TSH is normal at 4.3.  He will continue the same dose.       Orders placed this encounter:  Orders Placed This Encounter  Procedures  . CT Chest W Contrast  . CBC with Differential/Platelet  . Comprehensive metabolic panel  . TSH      Derek Jack, [place] [phone]

## 2018-04-23 NOTE — Assessment & Plan Note (Signed)
1.  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 - Is able to eat well without any problems. -PET CT scan on 06/16/2017 did not show any evidence of recurrence. -He is continuing to smoke half pack of cigarettes per day.  I counseled to quit smoking.   2.  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  There is stable sub-5 mm right middle lobe and left lower lobe nodules. -CT of the chest on 09/18/2017 did not show any change in the right upper lobe nodule.  4 mm anterior right upper lobe nodule is also unchanged. - CT of the chest on 04/20/2018 shows evolving postradiation changes surrounding a stable appearing apical segment right upper lobe nodule.  Mediastinal lymph nodes have slightly increased in size by 3 mm.  Right upper lobe anterior segment nodule increased from 4 mm to 8 mm. -He reports that he has been bronchitis for the last couple of months and is on and off antibiotics. -I have recommended repeating another scan in 3 to 4 months.  I have also recommended him to quit smoking.  3.  Hypothyroidism: -His TSH was elevated at last visit at 19.2.  He was told to take Synthroid 75 mcg daily without missing doses. - Today his TSH is normal at 4.3.  He will continue the same dose.

## 2018-04-23 NOTE — Patient Instructions (Addendum)
[place] at [facility] Discharge Instructions  You were seen today by Dr. Delton Coombes. He went over your recent scan results. Your scan showed that your lymph nodes and the spot on your lung has grown in size. He would like to repeat your scan in 3 months  He will see you back in 3 months for labs and follow up.   Thank you for choosing [facility] at [facility] to provide your oncology and hematology care.  To afford each patient quality time with our provider, please arrive at least 15 minutes before your scheduled appointment time.   If you have a lab appointment with the [facility] please come in thru the  Main Entrance and check in at the main information desk  You need to re-schedule your appointment should you arrive 10 or more minutes late.  We strive to give you quality time with our providers, and arriving late affects you and other patients whose appointments are after yours.  Also, if you no show three or more times for appointments you may be dismissed from the clinic at the providers discretion.     Again, thank you for choosing [facility].  Our hope is that these requests will decrease the amount of time that you wait before being seen by our physicians.       _____________________________________________________________  Should you have questions after your visit to [facility], please contact our office at [phone] between the hours of 8:00 a.m. and 4:30 p.m.  Voicemails left after 4:00 p.m. will not be returned until the following business day.  For prescription refill requests, have your pharmacy contact our office and allow 72 hours.    Cancer Center Support Programs:   > Cancer Support Group  2nd Tuesday of the month 1pm-2pm, Journey Room

## 2018-05-04 DIAGNOSIS — I4891 Unspecified atrial fibrillation: Secondary | ICD-10-CM | POA: Diagnosis not present

## 2018-05-04 DIAGNOSIS — E039 Hypothyroidism, unspecified: Secondary | ICD-10-CM | POA: Diagnosis not present

## 2018-05-27 ENCOUNTER — Other Ambulatory Visit: Payer: Self-pay

## 2018-05-27 MED ORDER — PAIN MANAGEMENT IT PUMP REFILL: 1.0000 | Freq: Once | INTRATHECAL | 0 refills | Status: AC

## 2018-06-04 DIAGNOSIS — E039 Hypothyroidism, unspecified: Secondary | ICD-10-CM | POA: Diagnosis not present

## 2018-06-04 DIAGNOSIS — E782 Mixed hyperlipidemia: Secondary | ICD-10-CM | POA: Diagnosis not present

## 2018-06-22 NOTE — Progress Notes (Signed)
Patient's Name: Darrell White  MRN: [id number]  Referring Provider: [name], MD  DOB: 16-Feb-1950  PCP: [name], MD  DOS: 06/23/2018  Note by: Gaspar Cola, MD  Service setting: Ambulatory outpatient  Specialty: Interventional Pain Management  Patient type: Established  Location: [facility] (AMB) Pain Management Facility  Visit type: Interventional Procedure   Primary Reason for Visit: Interventional Pain Management Treatment. CC: Back Pain (lower bilateral )  Procedure:          Intrathecal Drug Delivery System (IDDS):  Type: Reservoir Refill [phone]) No rate change Region: Abdominal Laterality: Right  Type of Pump: Medtronic Synchromed II Delivery Route: Intrathecal Type of Pain Treated: Neuropathic/Nociceptive Primary Medication Class: Opioid/opiate  Medication, Concentration, Infusion Program, & Delivery Rate: Please see scanned programming printout.   Indications: 1. Cancer-related pain   2. Chronic low back pain (Primary Area of Pain) (Bilateral) (R>L)   3. Chronic lower extremity pain (Secondary Area of Pain) (Bilateral) (R>L)   4. Chronic neck pain (Tertiary Area of Pain) (Left)   5. DDD (degenerative disc disease), lumbar   6. Failed back surgical syndrome   7. Chronic pain syndrome    Pain Assessment: Self-Reported Pain Score: 6 /10             Reported level is compatible with observation.        Intrathecal Pump Therapy Assessment  Manufacturer: Medtronic Synchromed Type: Programmable Volume: 40 mL reservoir MRI compatibility: Yes   Drug content:  Primary Medication Class: Opioid Primary Medication: PF-Morphine (20 mg/mL) Secondary Medication: PF-Bupivacaine (20 mg/mL) Other Medication: No third medication   Programming:  Type: Simple continuous. See pump readout for details.   Changes:  Medication Change: None at this point Rate Change: No change in rate  Reported side-effects or adverse reactions: None reported  Effectiveness: Described as  relatively effective, allowing for increase in activities of daily living (ADL) Clinically meaningful improvement in function (CMIF): Sustained CMIF goals met  Plan: Pump refill today  Pre-op Assessment:  Mr. Lukasik is a 68 y.o. (year old), male patient, seen today for interventional treatment. He  has a past surgical history that includes Mouth surgery; Cervical disc surgery; Shoulder arthroscopy; Back surgery; Spinal fusion; Spine surgery; Knee arthroscopy; Cataract extraction w/PHACO (Left, 04/09/2012); Direct laryngoscopy (N/A, 03/26/2016); Excision of tongue lesion (Right, 03/26/2016); OTHER SURGICAL HISTORY; and CT LUNG SCREENING (06/16/2017). Mr. Soler has a current medication list which includes the following prescription(s): acetaminophen, albuterol, atorvastatin, docusate calcium, eliquis, gabapentin, levothyroxine, mirtazapine, omeprazole, oxycodone hcl, oxycodone hcl, oxycodone hcl, sotalol, tiotropium, trazodone, and atorvastatin. His primarily concern today is the Back Pain (lower bilateral )  Initial Vital Signs:  Pulse/HCG Rate: (!) 58  Temp: 98.3 F (36.8 C) Resp: 16 BP: (!) 135/116 SpO2: 99 %  BMI: Estimated body mass index is 25.5 kg/m as calculated from the following:   Height as of this encounter: '5\' 6"'$  (1.676 m).   Weight as of this encounter: 158 lb (71.7 kg).  Risk Assessment: Allergies: Reviewed. He has No Known Allergies.  Allergy Precautions: None required Coagulopathies: Reviewed. None identified.  Blood-thinner therapy: None at this time Active Infection(s): Reviewed. None identified. Mr. Boschee is afebrile  Site Confirmation: Mr. Detienne was asked to confirm the procedure and laterality before marking the site Procedure checklist: Completed Consent: Before the procedure and under the influence of no sedative(s), amnesic(s), or anxiolytics, the patient was informed of the treatment options, risks and possible complications. To fulfill our ethical and legal  obligations, as recommended by the American Medical Association's Code of Ethics, I have informed the patient of my clinical impression; the nature and purpose of the treatment or procedure; the risks, benefits, and possible complications of the intervention; the alternatives, including doing nothing; the risk(s) and benefit(s) of the alternative treatment(s) or procedure(s); and the risk(s) and benefit(s) of doing nothing.  Mr. Hirano was provided with information about the general risks and possible complications associated with most interventional procedures. These include, but are not limited to: failure to achieve desired goals, infection, bleeding, organ or nerve damage, allergic reactions, paralysis, and/or death.  In addition, he was informed of those risks and possible complications associated to this particular procedure, which include, but are not limited to: damage to the implant; failure to decrease pain; local, systemic, or serious CNS infections, intraspinal abscess with possible cord compression and paralysis, or life-threatening such as meningitis; bleeding; organ damage; nerve injury or damage with subsequent sensory, motor, and/or autonomic system dysfunction, resulting in transient or permanent pain, numbness, and/or weakness of one or several areas of the body; allergic reactions, either minor or major life-threatening, such as anaphylactic or anaphylactoid reactions.  Furthermore, Mr. Willet was informed of those risks and complications associated with the medications. These include, but are not limited to: allergic reactions (i.e.: anaphylactic or anaphylactoid reactions); endorphine suppression; bradycardia and/or hypotension; water retention and/or peripheral vascular relaxation leading to lower extremity edema and possible stasis ulcers; respiratory depression and/or shortness of breath; decreased metabolic rate leading to weight gain; swelling or edema; medication-induced neural  toxicity; particulate matter embolism and blood vessel occlusion with resultant organ, and/or nervous system infarction; and/or intrathecal granuloma formation with possible spinal cord compression and permanent paralysis.  Before refilling the pump Mr. Rowe was informed that some of the medications used in the devise may not be FDA approved for such use and therefore it constitutes an off-label use of the medications.  Finally, he was informed that Medicine is not an exact science; therefore, there is also the possibility of unforeseen or unpredictable risks and/or possible complications that may result in a catastrophic outcome. The patient indicated having understood very clearly. We have given the patient no guarantees and we have made no promises. Enough time was given to the patient to ask questions, all of which were answered to the patient's satisfaction. Mr. Jedlicka has indicated that he wanted to continue with the procedure. Attestation: I, the ordering provider, attest that I have discussed with the patient the benefits, risks, side-effects, alternatives, likelihood of achieving goals, and potential problems during recovery for the procedure that I have provided informed consent. Date  Time: 06/23/2018 11:12 AM  Pre-Procedure Preparation:  Monitoring: As per clinic protocol. Respiration, ETCO2, SpO2, BP, heart rate and rhythm monitor placed and checked for adequate function Safety Precautions: Patient was assessed for positional comfort and pressure points before starting the procedure. Time-out: I initiated and conducted the "Time-out" before starting the procedure, as per protocol. The patient was asked to participate by confirming the accuracy of the "Time Out" information. Verification of the correct person, site, and procedure were performed and confirmed by me, the nursing staff, and the patient. "Time-out" conducted as per Joint Commission's Universal Protocol (UP.01.01.01). Time:  1240  Description of Procedure:          Position: Supine Target Area: Central-port of intrathecal pump. Approach: Anterior, 90 degree angle approach. Area Prepped: Entire Area around the pump implant. Prepping solution: ChloraPrep (2% chlorhexidine gluconate and 70% isopropyl  alcohol) Safety Precautions: Aspiration looking for blood return was conducted prior to all injections. At no point did we inject any substances, as a needle was being advanced. No attempts were made at seeking any paresthesias. Safe injection practices and needle disposal techniques used. Medications properly checked for expiration dates. SDV (single dose vial) medications used. Description of the Procedure: Protocol guidelines were followed. Two nurses trained to do implant refills were present during the entire procedure. The refill medication was checked by both healthcare providers as well as the patient. The patient was included in the "Time-out" to verify the medication. The patient was placed in position. The pump was identified. The area was prepped in the usual manner. The sterile template was positioned over the pump, making sure the side-port location matched that of the pump. Both, the pump and the template were held for stability. The needle provided in the Medtronic Kit was then introduced thru the center of the template and into the central port. The pump content was aspirated and discarded volume documented. The new medication was slowly infused into the pump, thru the filter, making sure to avoid overpressure of the device. The needle was then removed and the area cleansed, making sure to leave some of the prepping solution back to take advantage of its long term bactericidal properties. The pump was interrogated and programmed to reflect the correct medication, volume, and dosage. The program was printed and taken to the physician for approval. Once checked and signed by the physician, a copy was provided to the  patient and another scanned into the EMR. Vitals:   06/23/18 1214 06/23/18 1221  BP: (!) 135/116 (!) 113/58  Pulse: (!) 58   Resp: 16   Temp: 98.3 F (36.8 C)   TempSrc: Oral   SpO2: 99%   Weight: 158 lb (71.7 kg)   Height: '5\' 6"'$  (1.676 m)     Start Time: 1240 hrs. End Time: 1248 hrs. Materials & Medications: Medtronic Refill Kit Medication(s): Please see chart orders for details.  Imaging Guidance:          Type of Imaging Technique: None used Indication(s): N/A Exposure Time: No patient exposure Contrast: None used. Fluoroscopic Guidance: N/A Ultrasound Guidance: N/A Interpretation: N/A  Antibiotic Prophylaxis:   Anti-infectives (From admission, onward)   None     Indication(s): None identified  Post-operative Assessment:  Post-procedure Vital Signs:  Pulse/HCG Rate: (!) 58  Temp: 98.3 F (36.8 C) Resp: 16 BP: (!) 113/58 SpO2: 99 %  EBL: None  Complications: No immediate post-treatment complications observed by team, or reported by patient.  Note: The patient tolerated the entire procedure well. A repeat set of vitals were taken after the procedure and the patient was kept under observation following institutional policy, for this type of procedure. Post-procedural neurological assessment was performed, showing return to baseline, prior to discharge. The patient was provided with post-procedure discharge instructions, including a section on how to identify potential problems. Should any problems arise concerning this procedure, the patient was given instructions to immediately contact us, at any time, without hesitation. In any case, we plan to contact the patient by telephone for a follow-up status report regarding this interventional procedure.  Comments:  No additional relevant information.  Plan of Care  Orders:  Orders Placed This Encounter  Procedures  . PUMP REFILL    Maintain Protocol by having two(2) healthcare providers during procedure and  programming.    Scheduling Instructions:     Please refill intrathecal pump  today.    Order Specific Question:   Where will this procedure be performed?    Answer:   [facility]  . PUMP REFILL    Whenever possible schedule on a procedure today.    Standing Status:   Future    Standing Expiration Date:   11/19/2018    Scheduling Instructions:     Please schedule intrathecal pump refill based on pump programming. Avoid schedule intervals of more than 120 days (4 months).    Order Specific Question:   Where will this procedure be performed?    Answer:   [facility]  . Informed Consent Details: Transcribe to consent form and obtain patient signature    Consent Attestation: I, the ordering provider, attest that I have discussed with the patient the benefits, risks, side-effects, alternatives, likelihood of achieving goals, and potential problems during recovery for the procedure that I have provided informed consent.    Scheduling Instructions:     Procedure: Intrathecal Pump Refill     Attending Physician: Beatriz Chancellor A. Dossie Arbour, MD     Indications: Chronic Pain Syndrome (G89.4)   Medications ordered for procedure: Meds ordered this encounter  Medications  . Oxycodone HCl 10 MG TABS    Sig: Take 1 tablet (10 mg total) by mouth every 6 (six) hours as needed for up to 30 days.    Dispense:  120 tablet    Refill:  0    [place] STOP ACT - Not applicable to Chronic Pain Syndrome (G89.4) diagnosis. Fill one day early if pharmacy is closed on scheduled refill date. Do not fill until: 06/24/18. To last until: 07/24/18.  Marland Kitchen Oxycodone HCl 10 MG TABS    Sig: Take 1 tablet (10 mg total) by mouth every 6 (six) hours as needed for up to 30 days. Must last 30 days.    Dispense:  120 tablet    Refill:  0    [place] STOP ACT - Not applicable to Chronic Pain Syndrome (G89.4) diagnosis. Fill one day early if pharmacy is closed on scheduled refill date. Do not fill until: 07/24/18. To last until:  08/23/18.  Marland Kitchen Oxycodone HCl 10 MG TABS    Sig: Take 1 tablet (10 mg total) by mouth every 6 (six) hours as needed for up to 30 days. Must last 30 days.    Dispense:  120 tablet    Refill:  0    [place] STOP ACT - Not applicable to Chronic Pain Syndrome (G89.4) diagnosis. Fill one day early if pharmacy is closed on scheduled refill date. Do not fill until: 08/23/18. To last until: 09/22/18.   Medications administered: Sabas Sous had no medications administered during this visit.  See the medical record for exact dosing, route, and time of administration.  Disposition: Discharge home  Discharge Date & Time: 06/23/2018; 1256 hrs.   Follow-up plan:   Return for Pump Refill (Max:6mo, in addition, Med-Mgmt.     Future Appointments  Date Time Provider D[place] 07/27/2018 12:10 PM AP-AC[facility] LAB AP-ACAPA None  07/27/2018  1:00 PM AP-CT 1 AP-CT [facility]  07/31/2018 11:30 AM LGlennie Isle NP-C AP-ACAPA None  09/17/2018 11:00 AM [facility]   Primary Care Physician: SManon Hilding MD [facility] Note by: FGaspar Cola MD Date: 06/23/2018; Time: 2:29 PM  Disclaimer:  Medicine is not an eChief Strategy Officer The only guarantee in medicine is that nothing is guaranteed. It is important  to note that the decision to proceed with this intervention was based on the information collected from the patient. The Data and conclusions were drawn from the patient's questionnaire, the interview, and the physical examination. Because the information was provided in large part by the patient, it cannot be guaranteed that it has not been purposely or unconsciously manipulated. Every effort has been made to obtain as much relevant data as possible for this evaluation. It is important to note that the conclusions that lead to this procedure are derived in large part from the available data. Always take into account that the treatment will  also be dependent on availability of resources and existing treatment guidelines, considered by other Pain Management Practitioners as being common knowledge and practice, at the time of the intervention. For Medico-Legal purposes, it is also important to point out that variation in procedural techniques and pharmacological choices are the acceptable norm. The indications, contraindications, technique, and results of the above procedure should only be interpreted and judged by a Board-Certified Interventional Pain Specialist with extensive familiarity and expertise in the same exact procedure and technique.

## 2018-06-23 ENCOUNTER — Other Ambulatory Visit: Payer: Self-pay

## 2018-06-23 ENCOUNTER — Ambulatory Visit: Payer: PPO | Attending: Pain Medicine | Admitting: Pain Medicine

## 2018-06-23 ENCOUNTER — Encounter (INDEPENDENT_AMBULATORY_CARE_PROVIDER_SITE_OTHER): Payer: Self-pay

## 2018-06-23 ENCOUNTER — Encounter: Payer: Self-pay | Admitting: Pain Medicine

## 2018-06-23 VITALS — BP 113/58 | HR 58 | Temp 98.3°F | Resp 16 | Ht 66.0 in | Wt 158.0 lb

## 2018-06-23 DIAGNOSIS — M961 Postlaminectomy syndrome, not elsewhere classified: Secondary | ICD-10-CM

## 2018-06-23 DIAGNOSIS — M5442 Lumbago with sciatica, left side: Secondary | ICD-10-CM | POA: Insufficient documentation

## 2018-06-23 DIAGNOSIS — G893 Neoplasm related pain (acute) (chronic): Secondary | ICD-10-CM | POA: Diagnosis not present

## 2018-06-23 DIAGNOSIS — G894 Chronic pain syndrome: Secondary | ICD-10-CM | POA: Diagnosis not present

## 2018-06-23 DIAGNOSIS — M5136 Other intervertebral disc degeneration, lumbar region: Secondary | ICD-10-CM | POA: Insufficient documentation

## 2018-06-23 DIAGNOSIS — G8929 Other chronic pain: Secondary | ICD-10-CM

## 2018-06-23 DIAGNOSIS — M79605 Pain in left leg: Secondary | ICD-10-CM | POA: Diagnosis not present

## 2018-06-23 DIAGNOSIS — M542 Cervicalgia: Secondary | ICD-10-CM | POA: Diagnosis not present

## 2018-06-23 DIAGNOSIS — M51369 Other intervertebral disc degeneration, lumbar region without mention of lumbar back pain or lower extremity pain: Secondary | ICD-10-CM

## 2018-06-23 DIAGNOSIS — M79604 Pain in right leg: Secondary | ICD-10-CM | POA: Diagnosis not present

## 2018-06-23 DIAGNOSIS — M5441 Lumbago with sciatica, right side: Secondary | ICD-10-CM | POA: Insufficient documentation

## 2018-06-23 MED ORDER — OXYCODONE HCL 10 MG PO TABS: 10.0000 mg | ORAL_TABLET | Freq: Four times a day (QID) | ORAL | 0 refills | Status: DC | PRN

## 2018-06-23 NOTE — Progress Notes (Signed)
Nursing Pain Medication Assessment:  Safety precautions to be maintained throughout the outpatient stay will include: orient to surroundings, keep bed in low position, maintain call bell within reach at all times, provide assistance with transfer out of bed and ambulation.  Medication Inspection Compliance: Pill count conducted under aseptic conditions, in front of the patient. Neither the pills nor the bottle was removed from the patient's sight at any time. Once count was completed pills were immediately returned to the patient in their original bottle.  Medication: Oxycodone IR Pill/Patch Count: 7 of 120 pills remain Pill/Patch Appearance: Markings consistent with prescribed medication Bottle Appearance: Standard pharmacy container. Clearly labeled. Filled Date: 0420/ 2020 Last Medication intake:  Today

## 2018-06-29 MED FILL — Medication: INTRATHECAL | Qty: 1 | Status: AC

## 2018-07-04 DIAGNOSIS — E782 Mixed hyperlipidemia: Secondary | ICD-10-CM | POA: Diagnosis not present

## 2018-07-04 DIAGNOSIS — I10 Essential (primary) hypertension: Secondary | ICD-10-CM | POA: Diagnosis not present

## 2018-07-04 DIAGNOSIS — E039 Hypothyroidism, unspecified: Secondary | ICD-10-CM | POA: Diagnosis not present

## 2018-07-10 DIAGNOSIS — F17210 Nicotine dependence, cigarettes, uncomplicated: Secondary | ICD-10-CM | POA: Diagnosis not present

## 2018-07-10 DIAGNOSIS — I10 Essential (primary) hypertension: Secondary | ICD-10-CM | POA: Diagnosis not present

## 2018-07-10 DIAGNOSIS — Z7901 Long term (current) use of anticoagulants: Secondary | ICD-10-CM | POA: Diagnosis not present

## 2018-07-10 DIAGNOSIS — E7800 Pure hypercholesterolemia, unspecified: Secondary | ICD-10-CM | POA: Diagnosis not present

## 2018-07-10 DIAGNOSIS — I2510 Atherosclerotic heart disease of native coronary artery without angina pectoris: Secondary | ICD-10-CM | POA: Diagnosis not present

## 2018-07-10 DIAGNOSIS — I480 Paroxysmal atrial fibrillation: Secondary | ICD-10-CM | POA: Diagnosis not present

## 2018-07-15 DIAGNOSIS — S161XXA Strain of muscle, fascia and tendon at neck level, initial encounter: Secondary | ICD-10-CM | POA: Diagnosis not present

## 2018-07-15 DIAGNOSIS — Z6823 Body mass index (BMI) 23.0-23.9, adult: Secondary | ICD-10-CM | POA: Diagnosis not present

## 2018-07-27 ENCOUNTER — Other Ambulatory Visit: Payer: Self-pay

## 2018-07-27 ENCOUNTER — Inpatient Hospital Stay (HOSPITAL_COMMUNITY): Payer: PPO | Attending: Hematology

## 2018-07-27 ENCOUNTER — Ambulatory Visit (HOSPITAL_COMMUNITY)
Admission: RE | Admit: 2018-07-27 | Discharge: 2018-07-27 | Disposition: A | Discharge status: Home / Self Care | Payer: PPO | Source: Ambulatory Visit | Attending: Hematology | Admitting: Hematology

## 2018-07-27 DIAGNOSIS — C029 Malignant neoplasm of tongue, unspecified: Secondary | ICD-10-CM | POA: Insufficient documentation

## 2018-07-27 DIAGNOSIS — C3411 Malignant neoplasm of upper lobe, right bronchus or lung: Secondary | ICD-10-CM | POA: Diagnosis not present

## 2018-07-27 DIAGNOSIS — J439 Emphysema, unspecified: Secondary | ICD-10-CM | POA: Diagnosis not present

## 2018-07-27 DIAGNOSIS — G629 Polyneuropathy, unspecified: Secondary | ICD-10-CM | POA: Diagnosis not present

## 2018-07-27 DIAGNOSIS — Z79899 Other long term (current) drug therapy: Secondary | ICD-10-CM | POA: Diagnosis not present

## 2018-07-27 DIAGNOSIS — I10 Essential (primary) hypertension: Secondary | ICD-10-CM | POA: Insufficient documentation

## 2018-07-27 DIAGNOSIS — E039 Hypothyroidism, unspecified: Secondary | ICD-10-CM | POA: Diagnosis not present

## 2018-07-27 DIAGNOSIS — Z8042 Family history of malignant neoplasm of prostate: Secondary | ICD-10-CM | POA: Diagnosis not present

## 2018-07-27 DIAGNOSIS — K219 Gastro-esophageal reflux disease without esophagitis: Secondary | ICD-10-CM | POA: Diagnosis not present

## 2018-07-27 DIAGNOSIS — F329 Major depressive disorder, single episode, unspecified: Secondary | ICD-10-CM | POA: Diagnosis not present

## 2018-07-27 DIAGNOSIS — F17210 Nicotine dependence, cigarettes, uncomplicated: Secondary | ICD-10-CM | POA: Insufficient documentation

## 2018-07-27 DIAGNOSIS — Z923 Personal history of irradiation: Secondary | ICD-10-CM | POA: Diagnosis not present

## 2018-07-27 DIAGNOSIS — Z7901 Long term (current) use of anticoagulants: Secondary | ICD-10-CM | POA: Insufficient documentation

## 2018-07-27 DIAGNOSIS — E7800 Pure hypercholesterolemia, unspecified: Secondary | ICD-10-CM | POA: Diagnosis not present

## 2018-07-27 DIAGNOSIS — Z809 Family history of malignant neoplasm, unspecified: Secondary | ICD-10-CM | POA: Diagnosis not present

## 2018-07-27 DIAGNOSIS — Z85118 Personal history of other malignant neoplasm of bronchus and lung: Secondary | ICD-10-CM | POA: Diagnosis not present

## 2018-07-27 LAB — COMPREHENSIVE METABOLIC PANEL
ALT: 10 U/L (ref 0–44)
AST: 17 U/L (ref 15–41)
Albumin: 4.1 g/dL (ref 3.5–5.0)
Alkaline Phosphatase: 46 U/L (ref 38–126)
Anion gap: 8 (ref 5–15)
BUN: 16 mg/dL (ref 8–23)
CO2: 26 mmol/L (ref 22–32)
Calcium: 9 mg/dL (ref 8.9–10.3)
Chloride: 105 mmol/L (ref 98–111)
Creatinine, Ser: 0.99 mg/dL (ref 0.61–1.24)
GFR calc Af Amer: >60 mL/min (ref >60)
GFR calc non Af Amer: >60 mL/min (ref >60)
Glucose, Bld: 97 mg/dL (ref 70–99)
Potassium: 4.3 mmol/L (ref 3.5–5.1)
Sodium: 139 mmol/L (ref 135–145)
Total Bilirubin: 0.4 mg/dL (ref 0.3–1.2)
Total Protein: 7.1 g/dL (ref 6.5–8.1)

## 2018-07-27 LAB — CBC WITH DIFFERENTIAL/PLATELET
Abs Immature Granulocytes: 0.01 10*3/uL (ref 0.00–0.07)
Basophils Absolute: 0.1 10*3/uL (ref 0.0–0.1)
Basophils Relative: 1 %
Eosinophils Absolute: 0.3 10*3/uL (ref 0.0–0.5)
Eosinophils Relative: 6 %
HCT: 43.3 % (ref 39.0–52.0)
Hemoglobin: 14 g/dL (ref 13.0–17.0)
Immature Granulocytes: 0 %
Lymphocytes Relative: 25 %
Lymphs Abs: 1.2 10*3/uL (ref 0.7–4.0)
MCH: 29.9 pg (ref 26.0–34.0)
MCHC: 32.3 g/dL (ref 30.0–36.0)
MCV: 92.3 fL (ref 80.0–100.0)
Monocytes Absolute: 0.5 10*3/uL (ref 0.1–1.0)
Monocytes Relative: 11 %
Neutro Abs: 2.9 10*3/uL (ref 1.7–7.7)
Neutrophils Relative %: 57 %
Platelets: 201 10*3/uL (ref 150–400)
RBC: 4.69 MIL/uL (ref 4.22–5.81)
RDW: 14.1 % (ref 11.5–15.5)
WBC: 5 10*3/uL (ref 4.0–10.5)
nRBC: 0 % (ref 0.0–0.2)

## 2018-07-27 LAB — TSH: TSH: 2.308 u[IU]/mL (ref 0.350–4.500)

## 2018-07-27 MED ORDER — IOHEXOL 300 MG/ML  SOLN
75.0000 mL | Freq: Once | INTRAMUSCULAR | Status: AC | PRN
  Administered 2018-07-27: 13:00:00 75 mL via INTRAVENOUS

## 2018-07-31 ENCOUNTER — Other Ambulatory Visit: Payer: Self-pay

## 2018-07-31 ENCOUNTER — Inpatient Hospital Stay (HOSPITAL_BASED_OUTPATIENT_CLINIC_OR_DEPARTMENT_OTHER): Payer: PPO | Admitting: Hematology

## 2018-07-31 VITALS — BP 106/62 | HR 65 | Temp 98.2°F | Resp 18 | Wt 167.1 lb

## 2018-07-31 DIAGNOSIS — Z79899 Other long term (current) drug therapy: Secondary | ICD-10-CM | POA: Diagnosis not present

## 2018-07-31 DIAGNOSIS — Z8042 Family history of malignant neoplasm of prostate: Secondary | ICD-10-CM

## 2018-07-31 DIAGNOSIS — Z7901 Long term (current) use of anticoagulants: Secondary | ICD-10-CM | POA: Diagnosis not present

## 2018-07-31 DIAGNOSIS — F17210 Nicotine dependence, cigarettes, uncomplicated: Secondary | ICD-10-CM

## 2018-07-31 DIAGNOSIS — I10 Essential (primary) hypertension: Secondary | ICD-10-CM | POA: Diagnosis not present

## 2018-07-31 DIAGNOSIS — G629 Polyneuropathy, unspecified: Secondary | ICD-10-CM | POA: Diagnosis not present

## 2018-07-31 DIAGNOSIS — C029 Malignant neoplasm of tongue, unspecified: Secondary | ICD-10-CM

## 2018-07-31 DIAGNOSIS — F329 Major depressive disorder, single episode, unspecified: Secondary | ICD-10-CM | POA: Diagnosis not present

## 2018-07-31 DIAGNOSIS — C3411 Malignant neoplasm of upper lobe, right bronchus or lung: Secondary | ICD-10-CM | POA: Diagnosis not present

## 2018-07-31 DIAGNOSIS — E7800 Pure hypercholesterolemia, unspecified: Secondary | ICD-10-CM | POA: Diagnosis not present

## 2018-07-31 DIAGNOSIS — K219 Gastro-esophageal reflux disease without esophagitis: Secondary | ICD-10-CM

## 2018-07-31 DIAGNOSIS — Z923 Personal history of irradiation: Secondary | ICD-10-CM

## 2018-07-31 DIAGNOSIS — Z809 Family history of malignant neoplasm, unspecified: Secondary | ICD-10-CM

## 2018-07-31 DIAGNOSIS — E039 Hypothyroidism, unspecified: Secondary | ICD-10-CM

## 2018-07-31 NOTE — Assessment & Plan Note (Signed)
1.  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  There is stable sub-5 mm right middle lobe and left lower lobe nodules. -CT of the chest on 09/18/2017 did not show any change in the right upper lobe nodule.  4 mm anterior right upper lobe nodule is also unchanged. - CT of the chest on 04/20/2018 shows evolving postradiation changes surrounding a stable appearing apical segment right upper lobe nodule.  Mediastinal lymph nodes have slightly increased in size by 3 mm.  Right upper lobe anterior segment nodule increased from 4 mm to 8 mm. - Restaging CT scan on July 27, 2018 suggests slight interval decrease of mediastinal and right hilar lymph nodes.  There was slight interval enlargement of the right lower lobe nodule 8.5 x 7.5 mm versus 8 x 7 mm.   - We will continue to monitor this.  Recommend repeat CT chest in 3 months.   2.  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 - Is able to eat well without any problems. -PET CT scan on 06/16/2017 did not show any evidence of recurrence. -He is continuing to smoke half pack of cigarettes per day.  I counseled to quit smoking.   3.  Hypothyroidism: -His TSH was elevated at last visit at 19.2.  He was told to take Synthroid 75 mcg daily without missing doses. - Today his TSH is normal at 4.3.  He will continue the same dose.

## 2018-07-31 NOTE — Progress Notes (Signed)
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 Benjamine Mola (ENT) consult   03/14/2016 Procedure   Flex laryngoscopy-base of tongue with ulcerative mass in the right side.   03/26/2016 Procedure   Biopsy of right tongue by  Dr. Benjamine Mola   03/27/2016 Pathology Results   Invasive squamous cell carcinoma.   04/02/2016 Miscellaneous   Pathology contacted and requested HPV testing on malignancy specimen.   04/09/2016 Imaging   CT soft tissue neck: IMPRESSION: Enhancing mass base of tongue on the right measures 25 x 27 mm compatible with carcinoma. No pathologic adenopathy in the neck.  Extensive carotid atherosclerotic disease. There is significant carotid stenosis on the left. Carotid duplex or CTA recommended for further evaluation.   04/09/2016 Imaging   CT chest w contrast: IMPRESSION: 1. Irregular spiculated 2.0 cm apical right upper lobe pulmonary nodule, new since 07/31/2006 PET-CT, morphologically suspicious for malignancy, which could represent a metastasis versus a synchronous primary bronchogenic carcinoma. 2. Separate tiny 3 mm solid pulmonary nodule in the right upper lobe, also new since 2008, indeterminate metastasis. 3. Mild right hilar lymphadenopathy, cannot exclude nodal metastasis. 4. Consider PET-CT for further staging evaluation. 5. Mild radiation fibrosis in the medial apical upper lobes bilaterally. 6. Additional findings include aortic atherosclerosis, 2 vessel coronary atherosclerosis and mild centrilobular emphysema.   05/03/2016 PET scan   Hypermetabolic mass (R) base of tongue and lingular tonsil c/w H&N cancer  recurrence. No evidence of metastatic adenopathy in neck. Hypermetabolic nodule in RUL lung measuring 15 mm concerning for pulmonary mets vs primary lung carcinoma (favor primary lung cancer). Additional small sub-5 mm pulmonary nodules are favored to be benign.    04/2016 Pathology Results   Focal staining for p16 positive in approx 5-10% of squamous cells.    05/20/2016 Pathology Results   RUL lung mass biopsy: surgical path squamous cell carcinoma      CANCER STAGING: Cancer Staging Primary cancer of right upper lobe of lung ([place]) Staging form: Lung, AJCC 7th Edition - Clinical stage from 06/05/2016: Stage IA (T1a, N0, M0) - Signed by [name], MD on 06/05/2016    INTERVAL HISTORY:  Darrell White 68 y.o. male presents today for follow-up.  Reports overall doing well.  Denies any significant fatigue.  Denies any new cough.  Denies any new onset of shortness of breath or chest pain.  He does continue to smoke about half a pack of cigarettes a day.  He denies any change in appetite.  Denies any abdominal pain or change in bowel habits.  He is here to discuss recent scans.   REVIEW OF SYSTEMS:  Review of Systems  All other systems reviewed and are negative.    PAST MEDICAL/SURGICAL HISTORY:  Past Medical History:  Diagnosis Date  . Anxiety   . Arthritis   . Bronchitis 03/25/2017  . Cancer ([facility])    right base of tongue  . Depression   . GERD (gastroesophageal reflux disease)   . HOH (hard of hearing)   . Hypercholesteremia   . Hypertension   . Hypothyroid   . Lung cancer ([facility])    right upper lobe non small cell lung  cancer  . Neuropathy   . 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 BACK SURGERY     had total of 7 surgeries with rods and plates  . CATARACT EXTRACTION W/PHACO Left 04/09/2012   Procedure: CATARACT EXTRACTION PHACO AND INTRAOCULAR LENS PLACEMENT ([facility]);  Surgeon: Tonny Branch, MD;  Location: [facility];  Service: Ophthalmology;  Laterality: Left;  CDE: 12.44  . CERVICAL DISC SURGERY    . CT LUNG SCREENING  06/16/2017  . DIRECT LARYNGOSCOPY N/A 03/26/2016   Procedure: DIRECT LARYNGOSCOPY;  Surgeon: Leta Baptist, MD;  Location: [facility];  Service: ENT;  Laterality: N/A;  . EXCISION OF TONGUE LESION Right 03/26/2016   Procedure: BIOPSY OF RIGHT TONGUE BASE MASS;  Surgeon: Leta Baptist, MD;  Location: [facility];  Service: ENT;  Laterality: Right;  . KNEE ARTHROSCOPY     right knee  . MOUTH SURGERY     removal of cancer  . OTHER SURGICAL HISTORY     insertion of pain pump  . SHOULDER ARTHROSCOPY     bilateral  . SPINAL FUSION    . SPINE SURGERY     insertion of morphine pump into spine     SOCIAL HISTORY:  Social History   Socioeconomic History  . Marital status: Married    Spouse name: Not on file  . Number of children: Not on file  . Years of education: Not on file  . Highest education level: Not on file  Occupational History  . Not on file  Social Needs  . Financial resource strain: Not on file  . Food insecurity    Worry: Not on file    Inability: Not on file  . Transportation needs    Medical: Not on file    Non-medical: Not on file  Tobacco Use  . Smoking status: Current Every Day Smoker    Packs/day: 1.00    Years: 55.00    Pack years: 55.00    Types: Cigarettes  . Smokeless tobacco: Never Used  Substance and Sexual Activity  . Alcohol use: No    Comment: H/O EtOHism drinking heavy- boubon/beer, quit ~ 2000  . Drug use: No  . Sexual activity: Yes    Birth control/protection: None  Lifestyle  . Physical activity    Days per week: Not on file    Minutes per session: Not on file  . Stress: Not on file  Relationships  . Social Herbalist on phone: Not on file    Gets together: Not on file    Attends religious service: Not on file    Active member of club or organization: Not on file    Attends meetings of clubs or  organizations: Not on file    Relationship status: Not on file  . Intimate partner violence    Fear of current or ex partner: Not on file    Emotionally abused: Not on file    Physically abused: Not on file    Forced sexual activity: Not on file  Other Topics Concern  . Not on file  Social History Narrative  . Not on file    FAMILY HISTORY:  Family History  Problem Relation Age of Onset  . Cancer Father   . Cancer Brother        Prostate cancer    CURRENT MEDICATIONS:  Outpatient Encounter Medications as of 07/31/2018  Medication Sig Note  .  acetaminophen (TYLENOL) 325 MG tablet Take 650 mg by mouth every 6 (six) hours as needed for mild pain.   Marland Kitchen albuterol (PROVENTIL HFA;VENTOLIN HFA) 108 (90 Base) MCG/ACT inhaler    . atorvastatin (LIPITOR) 20 MG tablet Take 80 mg by mouth daily.    Marland Kitchen atorvastatin (LIPITOR) 40 MG tablet Take 40 mg by mouth daily.   Mariane Baumgarten Calcium (STOOL SOFTENER PO) Take 1 tablet by mouth daily as needed (constipation).   Marland Kitchen ELIQUIS 5 MG TABS tablet Take 5 mg by mouth 2 (two) times daily.    Marland Kitchen gabapentin (NEURONTIN) 300 MG capsule Take 300 mg by mouth 3 (three) times daily.    Marland Kitchen levothyroxine (SYNTHROID, LEVOTHROID) 75 MCG tablet Take 100 mcg by mouth daily before breakfast.    . mirtazapine (REMERON) 45 MG tablet Take 45 mg by mouth at bedtime.   Marland Kitchen omeprazole (PRILOSEC) 20 MG capsule Take 20 mg by mouth daily as needed (heartburn).   . Oxycodone HCl 10 MG TABS Take 1 tablet (10 mg total) by mouth every 6 (six) hours as needed for up to 30 days. Must last 30 days. 06/23/2018: DO NOT DELETE, even if Expired!!! See Dr. Adalberto Cole care coordination note.  Derrill Memo ON 08/23/2018] Oxycodone HCl 10 MG TABS Take 1 tablet (10 mg total) by mouth every 6 (six) hours as needed for up to 30 days. Must last 30 days. 06/23/2018: DO NOT DELETE, even if Expired!!! See Dr. Adalberto Cole care coordination note.  . predniSONE (DELTASONE) 50 MG tablet    . sotalol (BETAPACE) 120 MG  tablet Take 120 mg by mouth daily.   Marland Kitchen tiotropium (SPIRIVA) 18 MCG inhalation capsule Place 18 mcg into inhaler and inhale daily.   . traZODone (DESYREL) 50 MG tablet Take 200 mg by mouth at bedtime.    . Oxycodone HCl 10 MG TABS Take 1 tablet (10 mg total) by mouth every 6 (six) hours as needed for up to 30 days. 06/23/2018: DO NOT DELETE, even if Expired!!! See Dr. Adalberto Cole care coordination note.  . [DISCONTINUED] levothyroxine (SYNTHROID) 100 MCG tablet     No facility-administered encounter medications on file as of 07/31/2018.     ALLERGIES:  Allergies  Allergen Reactions  . Nsaids Other (See Comments)    "upset stomach" per pt     PHYSICAL EXAM:  ECOG Performance status: 1  Vitals:   07/31/18 1026  BP: 106/62  Pulse: 65  Resp: 18  Temp: 98.2 F (36.8 C)  SpO2: 99%   Filed Weights   07/31/18 1026  Weight: 167 lb 1.6 oz (75.8 kg)    Physical Exam Constitutional:      Appearance: Normal appearance.  HENT:     Head: Normocephalic.     Nose: Nose normal.     Mouth/Throat:     Mouth: Mucous membranes are moist.     Pharynx: Oropharynx is clear.  Eyes:     Extraocular Movements: Extraocular movements intact.     Conjunctiva/sclera: Conjunctivae normal.  Neck:     Musculoskeletal: Normal range of motion.  Cardiovascular:     Rate and Rhythm: Normal rate and regular rhythm.     Pulses: Normal pulses.     Heart sounds: Normal heart sounds.  Pulmonary:     Effort: Pulmonary effort is normal.     Breath sounds: Normal breath sounds.  Abdominal:     General: Bowel sounds are normal.     Palpations: Abdomen is soft.  Musculoskeletal: Normal range of motion.  Skin:    General: Skin is warm and dry.  Neurological:     General: No focal deficit present.     Mental Status: He is alert and oriented to person, place, and time.  Psychiatric:        Mood and Affect: Mood normal.        Behavior: Behavior normal.        Thought Content: Thought content normal.         Judgment: Judgment normal.      LABORATORY DATA:  I have reviewed the labs as listed.  CBC    Component Value Date/Time   WBC 5.0 07/27/2018 1200   RBC 4.69 07/27/2018 1200   HGB 14.0 07/27/2018 1200   HCT 43.3 07/27/2018 1200   PLT 201 07/27/2018 1200   MCV 92.3 07/27/2018 1200   MCH 29.9 07/27/2018 1200   MCHC 32.3 07/27/2018 1200   RDW 14.1 07/27/2018 1200   LYMPHSABS 1.2 07/27/2018 1200   MONOABS 0.5 07/27/2018 1200   EOSABS 0.3 07/27/2018 1200   BASOSABS 0.1 07/27/2018 1200   CMP Latest Ref Rng & Units 07/27/2018 04/06/2018 09/18/2017  Glucose 70 - 99 mg/dL 97 99 112(H)  BUN 8 - 23 mg/dL 16 14 17   Creatinine 0.61 - 1.24 mg/dL 0.99 0.94 0.95  Sodium 135 - 145 mmol/L 139 137 139  Potassium 3.5 - 5.1 mmol/L 4.3 4.5 4.5  Chloride 98 - 111 mmol/L 105 102 103  CO2 22 - 32 mmol/L 26 27 28   Calcium 8.9 - 10.3 mg/dL 9.0 9.0 9.2  Total Protein 6.5 - 8.1 g/dL 7.1 6.9 7.0  Total Bilirubin 0.3 - 1.2 mg/dL 0.4 0.7 0.7  Alkaline Phos 38 - 126 U/L 46 37(L) 42  AST 15 - 41 U/L 17 17 20   ALT 0 - 44 U/L 10 18 12        ASSESSMENT & PLAN:   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  There is stable sub-5 mm right middle lobe and left lower lobe nodules. -CT of the chest on 09/18/2017 did not show any change in the right upper lobe nodule.  4 mm anterior right upper lobe nodule is also unchanged. - CT of the chest on 04/20/2018 shows evolving postradiation changes surrounding a stable appearing apical segment right upper lobe nodule.  Mediastinal lymph nodes have slightly increased in size by 3 mm.  Right upper lobe anterior segment nodule increased from 4 mm to 8 mm. - Restaging CT scan on July 27, 2018 suggests slight  interval decrease of mediastinal and right hilar lymph nodes.  There was slight interval enlargement of the right lower lobe nodule 8.5 x 7.5 mm versus 8 x 7 mm.   - We will continue to monitor this.  Recommend repeat CT chest in 3 months.   2.  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 - Is able to eat well without any problems. -PET CT scan on 06/16/2017 did not show any evidence of recurrence. -He is continuing to smoke half pack of cigarettes per day.  I counseled to quit smoking.   3.  Hypothyroidism: -His TSH was elevated at last visit  at 19.2.  He was told to take Synthroid 75 mcg daily without missing doses. - Today his TSH is normal at 4.3.  He will continue the same dose.       Orders placed this encounter:  Orders Placed This Encounter  Procedures  . CT Chest W Contrast      Roger Shelter, [place] [phone]

## 2018-08-24 IMAGING — CT NM PET TUM IMG INITIAL (PI) SKULL BASE T - THIGH
1 of 8 series · 1 of 25 positions shown · non-contrast
Comparison: PET-CT 07/31/2006;  chest CT 04/09/2016.

CLINICAL DATA: Subsequent treatment strategy for lung carcinoma.
RIGHT upper lobe lung mass..

EXAM:
NUCLEAR MEDICINE PET SKULL BASE TO THIGH
TECHNIQUE: 9.4 mCi F-18 FDG was injected intravenously. Full-ring PET imaging
was performed from the skull base to thigh after the radiotracer. CT
data was obtained and used for attenuation correction and anatomic
localization.
FASTING BLOOD GLUCOSE:  Value: 106 mg/dl

[Series 4: ct hn_sk_th 5.0 hd_fov · axial · 5.0mm · 1.27mm/px · 1 of 244 slices shown]
[im 1/244  brain]
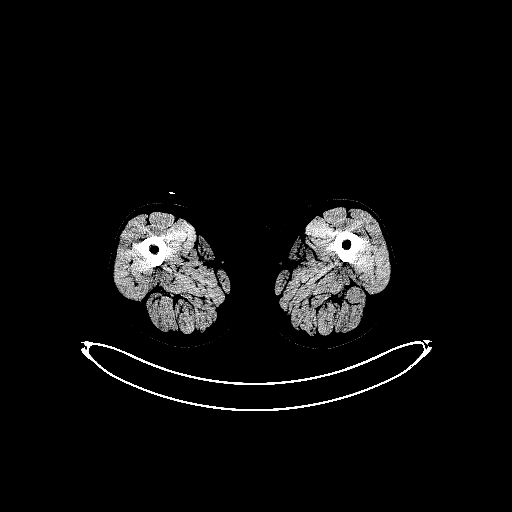

[1 of 25 positions shown; findings below may reference images not displayed]

FINDINGS: NECK

Hypermetabolic thickening a tthe RIGHT base of tongue and lingular
tonsil is intense with SUV max equal 20.0.

No hypermetabolic cervical lymph nodes. No hypermetabolic
supraclavicular lymph nodes.

CHEST

Nodule within the RIGHT upper lobe measures 15 mm (image 55, series
4) and has associated metabolic activity (SUV max equal 4.3).

No additional pulmonary nodules.

Small subpleural nodule in the RIGHT upper lobe measures 4 mm (image
70, series 4).

Small LEFT lower lobe nodule measures 3 mm (image 96, series 4

ABDOMEN/PELVIS

No abnormal hypermetabolic activity within the liver, pancreas,
adrenal glands, or spleen. No hypermetabolic lymph nodes in the
abdomen or pelvis.

Prostate normal.  Posterior lumbar fusion

SKELETON

No focal hypermetabolic activity to suggest skeletal metastasis.
IMPRESSION: 1. Hypermetabolic mass at the RIGHT base of tongue and lingular
tonsil consistent with head and neck carcinoma recurrence.
2. No evidence of metastatic adenopathy in the neck.
3. Hypermetabolic nodule in the superior segment of the RIGHT lung
is concerning for pulmonary metastasis versus primary lung
carcinoma. Favor primary lung cancer.
4. Additional small sub 5 mm pulmonary nodules are favored benign.

## 2018-09-01 ENCOUNTER — Other Ambulatory Visit: Payer: Self-pay

## 2018-09-01 MED ORDER — PAIN MANAGEMENT IT PUMP REFILL: 1.0000 | Freq: Once | INTRATHECAL | 0 refills | Status: AC

## 2018-09-03 ENCOUNTER — Other Ambulatory Visit: Payer: Self-pay

## 2018-09-04 DIAGNOSIS — E785 Hyperlipidemia, unspecified: Secondary | ICD-10-CM | POA: Diagnosis not present

## 2018-09-04 DIAGNOSIS — I10 Essential (primary) hypertension: Secondary | ICD-10-CM | POA: Diagnosis not present

## 2018-09-15 DIAGNOSIS — R5383 Other fatigue: Secondary | ICD-10-CM | POA: Diagnosis not present

## 2018-09-15 DIAGNOSIS — E782 Mixed hyperlipidemia: Secondary | ICD-10-CM | POA: Diagnosis not present

## 2018-09-15 DIAGNOSIS — M545 Low back pain: Secondary | ICD-10-CM | POA: Diagnosis not present

## 2018-09-15 DIAGNOSIS — J449 Chronic obstructive pulmonary disease, unspecified: Secondary | ICD-10-CM | POA: Diagnosis not present

## 2018-09-15 DIAGNOSIS — I4891 Unspecified atrial fibrillation: Secondary | ICD-10-CM | POA: Diagnosis not present

## 2018-09-15 DIAGNOSIS — Z6824 Body mass index (BMI) 24.0-24.9, adult: Secondary | ICD-10-CM | POA: Diagnosis not present

## 2018-09-15 DIAGNOSIS — E039 Hypothyroidism, unspecified: Secondary | ICD-10-CM | POA: Diagnosis not present

## 2018-09-15 DIAGNOSIS — R42 Dizziness and giddiness: Secondary | ICD-10-CM | POA: Diagnosis not present

## 2018-09-15 DIAGNOSIS — G4700 Insomnia, unspecified: Secondary | ICD-10-CM | POA: Diagnosis not present

## 2018-09-17 ENCOUNTER — Encounter: Payer: PPO | Admitting: Pain Medicine

## 2018-09-24 ENCOUNTER — Encounter: Payer: Self-pay | Admitting: Pain Medicine

## 2018-09-24 ENCOUNTER — Ambulatory Visit: Payer: PPO | Attending: Pain Medicine | Admitting: Pain Medicine

## 2018-09-24 ENCOUNTER — Other Ambulatory Visit: Payer: Self-pay

## 2018-09-24 VITALS — BP 74/58 | HR 145 | Temp 98.4°F | Resp 14 | Ht 68.0 in | Wt 167.0 lb

## 2018-09-24 DIAGNOSIS — G893 Neoplasm related pain (acute) (chronic): Secondary | ICD-10-CM | POA: Insufficient documentation

## 2018-09-24 DIAGNOSIS — M5441 Lumbago with sciatica, right side: Secondary | ICD-10-CM | POA: Insufficient documentation

## 2018-09-24 DIAGNOSIS — M961 Postlaminectomy syndrome, not elsewhere classified: Secondary | ICD-10-CM | POA: Diagnosis not present

## 2018-09-24 DIAGNOSIS — G8929 Other chronic pain: Secondary | ICD-10-CM | POA: Diagnosis not present

## 2018-09-24 DIAGNOSIS — G894 Chronic pain syndrome: Secondary | ICD-10-CM | POA: Diagnosis not present

## 2018-09-24 DIAGNOSIS — M79605 Pain in left leg: Secondary | ICD-10-CM | POA: Insufficient documentation

## 2018-09-24 DIAGNOSIS — Z95828 Presence of other vascular implants and grafts: Secondary | ICD-10-CM

## 2018-09-24 DIAGNOSIS — M79604 Pain in right leg: Secondary | ICD-10-CM | POA: Diagnosis not present

## 2018-09-24 DIAGNOSIS — M5442 Lumbago with sciatica, left side: Secondary | ICD-10-CM | POA: Insufficient documentation

## 2018-09-24 DIAGNOSIS — Z451 Encounter for adjustment and management of infusion pump: Secondary | ICD-10-CM | POA: Diagnosis not present

## 2018-09-24 MED ORDER — OXYCODONE HCL 10 MG PO TABS: 10.0000 mg | ORAL_TABLET | Freq: Four times a day (QID) | ORAL | 0 refills | Status: DC | PRN

## 2018-09-24 NOTE — Progress Notes (Signed)
Patient's Name: Darrell White  MRN: [id number]  Referring Provider: [name], MD  DOB: 1950-10-14  PCP: [name], MD  DOS: 09/24/2018  Note by: Gaspar Cola, MD  Service setting: Ambulatory outpatient  Specialty: Interventional Pain Management  Patient type: Established  Location: [facility] (AMB) Pain Management Facility  Visit type: Interventional Procedure   Primary Reason for Visit: Interventional Pain Management Treatment. CC: Back Pain (lower)  Procedure:          Intrathecal Drug Delivery System (IDDS):  Type: Reservoir Refill [phone]) No rate change Region: Abdominal Laterality: Right  Type of Pump: Medtronic Synchromed II Delivery Route: Intrathecal Type of Pain Treated: Neuropathic/Nociceptive Primary Medication Class: Opioid/opiate  Medication, Concentration, Infusion Program, & Delivery Rate: Please see scanned programming printout.   Indications: 1. Chronic pain syndrome   2. Cancer-related pain   3. Failed back surgical syndrome   4. Chronic low back pain (Primary Area of Pain) (Bilateral) (R>L)   5. Chronic lower extremity pain (Secondary Area of Pain) (Bilateral) (R>L)   6. Presence of implanted infusion pump   7. Adjustment and management of infusion pump    Pain Assessment: Self-Reported Pain Score: 8 /10             Reported level is compatible with observation.        Pharmacotherapy Assessment  Analgesic: Oxycodone IR 10 mg, 1 tabl PO q 6 hrs (40 mg/day of oxycodone) MME/day: 60 mg/day.   Monitoring: Pharmacotherapy: No side-effects or adverse reactions reported. [place] PMP: PDMP reviewed during this encounter.       Compliance: No problems identified. Effectiveness: Clinically acceptable. Plan: Refer to "POC".  UDS:  Summary  Date Value Ref Range Status  03/26/2018 FINAL  Final    Comment:    ==================================================================== TOXASSURE SELECT 13  (MW) ==================================================================== Test                             Result       Flag       Units Drug Present and Declared for Prescription Verification   Oxycodone                      1772         EXPECTED   ng/mg creat   Oxymorphone                    >4115        EXPECTED   ng/mg creat   Noroxycodone                   >4115        EXPECTED   ng/mg creat   Noroxymorphone                 1803         EXPECTED   ng/mg creat    Sources of oxycodone are scheduled prescription medications.    Oxymorphone, noroxycodone, and noroxymorphone are expected    metabolites of oxycodone. Oxymorphone is also available as a    scheduled prescription medication. Drug Present not Declared for Prescription Verification   Morphine                       1085         UNEXPECTED ng/mg creat    Potential sources of large amounts of morphine in the absence  of    codeine include administration of morphine or use of heroin. ==================================================================== Test                      Result    Flag   Units      Ref Range   Creatinine              243              mg/dL      >=20 ==================================================================== Declared Medications:  The flagging and interpretation on this report are based on the  following declared medications.  Unexpected results may arise from  inaccuracies in the declared medications.  **Note: The testing scope of this panel includes these medications:  Oxycodone  **Note: The testing scope of this panel does not include following  reported medications:  Acetaminophen (Tylenol)  Atorvastatin (Lipitor)  Docusate  Gabapentin (Neurontin)  Levothyroxine  Mirtazapine  Omeprazole  Sotalol  Tiotropium  Trazodone ==================================================================== For clinical consultation, please call [phone]. ====================================================================    Intrathecal Pump Therapy Assessment  Manufacturer: Medtronic Synchromed Type: Programmable Volume: 40 mL reservoir MRI compatibility: Yes   Drug content:  Primary Medication Class:Opioid Primary Medication:PF-Morphine(20 mg/mL) Secondary Medication:PF-Bupivacaine(20 mg/mL) Other Medication:No third medication   Programming:  Type: Simple continuous. See pump readout for details.   Changes:  Medication Change: None at this point Rate Change: No change in rate  Reported side-effects or adverse reactions: None reported  Effectiveness: Described as relatively effective, allowing for increase in activities of daily living (ADL) Clinically meaningful improvement in function (CMIF): Sustained CMIF goals met  Plan: Pump refill today  Pre-op Assessment:  Darrell White is a 68 y.o. (year old), male patient, seen today for interventional treatment. He  has a past surgical history that includes Mouth surgery; Cervical disc surgery; Shoulder arthroscopy; Back surgery; Spinal fusion; Spine surgery; Knee arthroscopy; Cataract extraction w/PHACO (Left, 04/09/2012); Direct laryngoscopy (N/A, 03/26/2016); Excision of tongue lesion (Right, 03/26/2016); OTHER SURGICAL HISTORY; and CT LUNG SCREENING (06/16/2017). Darrell White has a current medication list which includes the following prescription(s): acetaminophen, albuterol, atorvastatin, docusate calcium, eliquis, gabapentin, levothyroxine, metoprolol succinate, omeprazole, oxycodone hcl, oxycodone hcl, oxycodone hcl, tiotropium, trazodone, atorvastatin, mirtazapine, prednisone, and sotalol. His primarily concern today is the Back Pain (lower)  Initial Vital Signs:  Pulse/HCG Rate: (!) 145  Temp: 98.4 F (36.9 C) Resp: 14 BP: (!) 74/58 SpO2: 100 %  BMI: Estimated body mass index is 25.39 kg/m as calculated from the following:   Height as of this encounter: '5\' 8"'$   (1.727 m).   Weight as of this encounter: 167 lb (75.8 kg).  Risk Assessment: Allergies: Reviewed. He is allergic to nsaids.  Allergy Precautions: None required Coagulopathies: Reviewed. None identified.  Blood-thinner therapy: None at this time Active Infection(s): Reviewed. None identified. Darrell White is afebrile  Site Confirmation: Darrell White was asked to confirm the procedure and laterality before marking the site Procedure checklist: Completed Consent: Before the procedure and under the influence of no sedative(s), amnesic(s), or anxiolytics, the patient was informed of the treatment options, risks and possible complications. To fulfill our ethical and legal obligations, as recommended by the American Medical Association's Code of Ethics, I have informed the patient of my clinical impression; the nature and purpose of the treatment or procedure; the risks, benefits, and possible complications of the intervention; the alternatives, including doing nothing; the risk(s) and benefit(s) of the alternative treatment(s) or procedure(s); and the risk(s) and benefit(s)  of doing nothing.  Darrell White was provided with information about the general risks and possible complications associated with most interventional procedures. These include, but are not limited to: failure to achieve desired goals, infection, bleeding, organ or nerve damage, allergic reactions, paralysis, and/or death.  In addition, he was informed of those risks and possible complications associated to this particular procedure, which include, but are not limited to: damage to the implant; failure to decrease pain; local, systemic, or serious CNS infections, intraspinal abscess with possible cord compression and paralysis, or life-threatening such as meningitis; bleeding; organ damage; nerve injury or damage with subsequent sensory, motor, and/or autonomic system dysfunction, resulting in transient or permanent pain, numbness, and/or  weakness of one or several areas of the body; allergic reactions, either minor or major life-threatening, such as anaphylactic or anaphylactoid reactions.  Furthermore, Darrell White was informed of those risks and complications associated with the medications. These include, but are not limited to: allergic reactions (i.e.: anaphylactic or anaphylactoid reactions); endorphine suppression; bradycardia and/or hypotension; water retention and/or peripheral vascular relaxation leading to lower extremity edema and possible stasis ulcers; respiratory depression and/or shortness of breath; decreased metabolic rate leading to weight gain; swelling or edema; medication-induced neural toxicity; particulate matter embolism and blood vessel occlusion with resultant organ, and/or nervous system infarction; and/or intrathecal granuloma formation with possible spinal cord compression and permanent paralysis.  Before refilling the pump Darrell White was informed that some of the medications used in the devise may not be FDA approved for such use and therefore it constitutes an off-label use of the medications.  Finally, he was informed that Medicine is not an exact science; therefore, there is also the possibility of unforeseen or unpredictable risks and/or possible complications that may result in a catastrophic outcome. The patient indicated having understood very clearly. We have given the patient no guarantees and we have made no promises. Enough time was given to the patient to ask questions, all of which were answered to the patient's satisfaction. Darrell White has indicated that he wanted to continue with the procedure. Attestation: I, the ordering provider, attest that I have discussed with the patient the benefits, risks, side-effects, alternatives, likelihood of achieving goals, and potential problems during recovery for the procedure that I have provided informed consent. Date  Time: 09/24/2018 11:55 AM  Pre-Procedure  Preparation:  Monitoring: As per clinic protocol. Respiration, ETCO2, SpO2, BP, heart rate and rhythm monitor placed and checked for adequate function Safety Precautions: Patient was assessed for positional comfort and pressure points before starting the procedure. Time-out: I initiated and conducted the "Time-out" before starting the procedure, as per protocol. The patient was asked to participate by confirming the accuracy of the "Time Out" information. Verification of the correct person, site, and procedure were performed and confirmed by me, the nursing staff, and the patient. "Time-out" conducted as per Joint Commission's Universal Protocol (UP.01.01.01). Time: 1210  Description of Procedure:          Position: Supine Target Area: Central-port of intrathecal pump. Approach: Anterior, 90 degree angle approach. Area Prepped: Entire Area around the pump implant. Prepping solution: DuraPrep (Iodine Povacrylex [0.7% available iodine] and Isopropyl Alcohol, 74% w/w) Safety Precautions: Aspiration looking for blood return was conducted prior to all injections. At no point did we inject any substances, as a needle was being advanced. No attempts were made at seeking any paresthesias. Safe injection practices and needle disposal techniques used. Medications properly checked for expiration dates. SDV (single dose vial) medications  used. Description of the Procedure: Protocol guidelines were followed. Two nurses trained to do implant refills were present during the entire procedure. The refill medication was checked by both healthcare providers as well as the patient. The patient was included in the "Time-out" to verify the medication. The patient was placed in position. The pump was identified. The area was prepped in the usual manner. The sterile template was positioned over the pump, making sure the side-port location matched that of the pump. Both, the pump and the template were held for stability. The  needle provided in the Medtronic Kit was then introduced thru the center of the template and into the central port. The pump content was aspirated and discarded volume documented. The new medication was slowly infused into the pump, thru the filter, making sure to avoid overpressure of the device. The needle was then removed and the area cleansed, making sure to leave some of the prepping solution back to take advantage of its long term bactericidal properties. The pump was interrogated and programmed to reflect the correct medication, volume, and dosage. The program was printed and taken to the physician for approval. Once checked and signed by the physician, a copy was provided to the patient and another scanned into the EMR. Vitals:   09/24/18 1152  BP: (!) 74/58  Pulse: (!) 145  Resp: 14  Temp: 98.4 F (36.9 C)  TempSrc: Oral  SpO2: 100%  Weight: 167 lb (75.8 kg)  Height: '5\' 8"'$  (1.727 m)    Start Time: 1212 hrs. End Time: 1232 hrs. Materials & Medications: Medtronic Refill Kit Medication(s): Please see chart orders for details.  Imaging Guidance:          Type of Imaging Technique: None used Indication(s): N/A Exposure Time: No patient exposure Contrast: None used. Fluoroscopic Guidance: N/A Ultrasound Guidance: N/A Interpretation: N/A  Antibiotic Prophylaxis:   Anti-infectives (From admission, onward)   None     Indication(s): None identified  Post-operative Assessment:  Post-procedure Vital Signs:  Pulse/HCG Rate: (!) 145  Temp: 98.4 F (36.9 C) Resp: 14 BP: (!) 74/58 SpO2: 100 %  EBL: None  Complications: No immediate post-treatment complications observed by team, or reported by patient.  Note: The patient tolerated the entire procedure well. A repeat set of vitals were taken after the procedure and the patient was kept under observation following institutional policy, for this type of procedure. Post-procedural neurological assessment was performed, showing  return to baseline, prior to discharge. The patient was provided with post-procedure discharge instructions, including a section on how to identify potential problems. Should any problems arise concerning this procedure, the patient was given instructions to immediately contact us, at any time, without hesitation. In any case, we plan to contact the patient by telephone for a follow-up status report regarding this interventional procedure.  Comments:  No additional relevant information.  Plan of Care  Orders:  Orders Placed This Encounter  Procedures  . PUMP REFILL    Maintain Protocol by having two(2) healthcare providers during procedure and programming.    Scheduling Instructions:     Please refill intrathecal pump today.    Order Specific Question:   Where will this procedure be performed?    Answer:   [facility]  . PUMP REFILL    Whenever possible schedule on a procedure today.    Standing Status:   Future    Standing Expiration Date:   02/21/2019    Scheduling Instructions:     Please schedule  intrathecal pump refill based on pump programming. Avoid schedule intervals of more than 120 days (4 months).    Order Specific Question:   Where will this procedure be performed?    Answer:   [facility]  . Informed Consent Details: Transcribe to consent form and obtain patient signature    Consent Attestation: I, the ordering provider, attest that I have discussed with the patient the benefits, risks, side-effects, alternatives, likelihood of achieving goals, and potential problems during recovery for the procedure that I have provided informed consent.    Scheduling Instructions:     Procedure: Intrathecal Pump Refill     Attending Physician: Beatriz Chancellor A. Dossie Arbour, MD     Indications: Chronic Pain Syndrome (G89.4)   Chronic Opioid Analgesic:  Oxycodone IR 10 mg, 1 tabl PO q 6 hrs (40 mg/day of oxycodone) MME/day: 60 mg/day.   Medications ordered for procedure: Meds  ordered this encounter  Medications  . Oxycodone HCl 10 MG TABS    Sig: Take 1 tablet (10 mg total) by mouth every 6 (six) hours as needed. Must last 30 days    Dispense:  120 tablet    Refill:  0    Chronic Pain: STOP Act (Not applicable) Fill 1 day early if closed on refill date. Do not fill until: 09/24/2018. To last until: 10/24/2018. Avoid benzodiazepines within 8 hours of opioids  . Oxycodone HCl 10 MG TABS    Sig: Take 1 tablet (10 mg total) by mouth every 6 (six) hours as needed. Must last 30 days    Dispense:  120 tablet    Refill:  0    Chronic Pain: STOP Act (Not applicable) Fill 1 day early if closed on refill date. Do not fill until: 10/24/2018. To last until: 11/23/2018. Avoid benzodiazepines within 8 hours of opioids  . Oxycodone HCl 10 MG TABS    Sig: Take 1 tablet (10 mg total) by mouth every 6 (six) hours as needed. Must last 30 days    Dispense:  120 tablet    Refill:  0    Chronic Pain: STOP Act (Not applicable) Fill 1 day early if closed on refill date. Do not fill until: 11/23/2018. To last until: 12/23/2018. Avoid benzodiazepines within 8 hours of opioids   Medications administered: Darrell White had no medications administered during this visit.  See the medical record for exact dosing, route, and time of administration.  Follow-up plan:   Return in about 3 months (around 12/23/2018) for Pump Refill (Max:58mo, in addition, (VV), E/M (MM).       Interventional management options:  Considering: Diagnostic bilateral intra-articular hip joint injection Therapeutic/palliative intrathecal pump refill(s) Diagnostic bilateral lumbar facet block#1 Possible bilateral lumbar facet RFA Diagnostic caudal epidural steroid injection plus diagnostic epidurogram Possible Racz procedure Diagnostic left cervical epidural steroid injection Diagnosticleft cervical facet block Possible left cervical facet RFA   Palliative PRN treatment(s): Diagnostic bilateral  lumbar facet block#1under fluoroscopic guidance and IV sedation Therapeutic/palliative intrathecal pump refill(s)    Recent Visits No visits were found meeting these conditions.  Showing recent visits within past 90 days and meeting all other requirements   Today's Visits Date Type Provider Dept  09/24/18 Procedure visit NMilinda Pointer MD [facility]  Showing today's visits and meeting all other requirements   Future Appointments Date Type Provider Dept  12/22/18 Appointment NMilinda Pointer MD [facility]  Showing future appointments within next 90 days and meeting all other requirements   Disposition: Discharge  home  Discharge Date & Time: 09/24/2018; 1240 hrs.   Primary Care Physician: [name], MD Location: [facility] Note by: Gaspar Cola, MD Date: 09/24/2018; Time: 12:57 PM  Disclaimer:  Medicine is not an Chief Strategy Officer. The only guarantee in medicine is that nothing is guaranteed. It is important to note that the decision to proceed with this intervention was based on the information collected from the patient. The Data and conclusions were drawn from the patient's questionnaire, the interview, and the physical examination. Because the information was provided in large part by the patient, it cannot be guaranteed that it has not been purposely or unconsciously manipulated. Every effort has been made to obtain as much relevant data as possible for this evaluation. It is important to note that the conclusions that lead to this procedure are derived in large part from the available data. Always take into account that the treatment will also be dependent on availability of resources and existing treatment guidelines, considered by other Pain Management Practitioners as being common knowledge and practice, at the time of the intervention. For Medico-Legal purposes, it is also important to point out that variation in  procedural techniques and pharmacological choices are the acceptable norm. The indications, contraindications, technique, and results of the above procedure should only be interpreted and judged by a Board-Certified Interventional Pain Specialist with extensive familiarity and expertise in the same exact procedure and technique.

## 2018-09-24 NOTE — Progress Notes (Signed)
Safety precautions to be maintained throughout the outpatient stay will include: orient to surroundings, keep bed in low position, maintain call bell within reach at all times, provide assistance with transfer out of bed and ambulation.  

## 2018-09-25 ENCOUNTER — Telehealth: Payer: Self-pay

## 2018-09-25 DIAGNOSIS — R001 Bradycardia, unspecified: Secondary | ICD-10-CM | POA: Diagnosis not present

## 2018-09-25 DIAGNOSIS — I959 Hypotension, unspecified: Secondary | ICD-10-CM | POA: Diagnosis not present

## 2018-09-25 DIAGNOSIS — I10 Essential (primary) hypertension: Secondary | ICD-10-CM | POA: Diagnosis not present

## 2018-09-25 DIAGNOSIS — E039 Hypothyroidism, unspecified: Secondary | ICD-10-CM | POA: Diagnosis not present

## 2018-09-25 DIAGNOSIS — F17210 Nicotine dependence, cigarettes, uncomplicated: Secondary | ICD-10-CM | POA: Diagnosis not present

## 2018-09-25 DIAGNOSIS — I499 Cardiac arrhythmia, unspecified: Secondary | ICD-10-CM | POA: Diagnosis not present

## 2018-09-25 DIAGNOSIS — I480 Paroxysmal atrial fibrillation: Secondary | ICD-10-CM | POA: Diagnosis not present

## 2018-09-25 DIAGNOSIS — R7989 Other specified abnormal findings of blood chemistry: Secondary | ICD-10-CM | POA: Diagnosis not present

## 2018-09-25 DIAGNOSIS — Z6824 Body mass index (BMI) 24.0-24.9, adult: Secondary | ICD-10-CM | POA: Diagnosis not present

## 2018-09-25 DIAGNOSIS — G8929 Other chronic pain: Secondary | ICD-10-CM | POA: Diagnosis not present

## 2018-09-25 DIAGNOSIS — Z8521 Personal history of malignant neoplasm of larynx: Secondary | ICD-10-CM | POA: Diagnosis not present

## 2018-09-25 DIAGNOSIS — Z79899 Other long term (current) drug therapy: Secondary | ICD-10-CM | POA: Diagnosis not present

## 2018-09-25 DIAGNOSIS — I248 Other forms of acute ischemic heart disease: Secondary | ICD-10-CM | POA: Diagnosis not present

## 2018-09-25 DIAGNOSIS — I444 Left anterior fascicular block: Secondary | ICD-10-CM | POA: Diagnosis not present

## 2018-09-25 DIAGNOSIS — Z85118 Personal history of other malignant neoplasm of bronchus and lung: Secondary | ICD-10-CM | POA: Diagnosis not present

## 2018-09-25 DIAGNOSIS — R9439 Abnormal result of other cardiovascular function study: Secondary | ICD-10-CM | POA: Diagnosis not present

## 2018-09-25 DIAGNOSIS — Z7901 Long term (current) use of anticoagulants: Secondary | ICD-10-CM | POA: Diagnosis not present

## 2018-09-25 DIAGNOSIS — Z5181 Encounter for therapeutic drug level monitoring: Secondary | ICD-10-CM | POA: Diagnosis not present

## 2018-09-25 DIAGNOSIS — I4892 Unspecified atrial flutter: Secondary | ICD-10-CM | POA: Diagnosis not present

## 2018-09-25 DIAGNOSIS — E7800 Pure hypercholesterolemia, unspecified: Secondary | ICD-10-CM | POA: Diagnosis not present

## 2018-09-25 DIAGNOSIS — Z20828 Contact with and (suspected) exposure to other viral communicable diseases: Secondary | ICD-10-CM | POA: Diagnosis not present

## 2018-09-25 DIAGNOSIS — M549 Dorsalgia, unspecified: Secondary | ICD-10-CM | POA: Diagnosis not present

## 2018-09-25 DIAGNOSIS — I4510 Unspecified right bundle-branch block: Secondary | ICD-10-CM | POA: Diagnosis not present

## 2018-09-25 DIAGNOSIS — J42 Unspecified chronic bronchitis: Secondary | ICD-10-CM | POA: Diagnosis not present

## 2018-09-25 DIAGNOSIS — I471 Supraventricular tachycardia: Secondary | ICD-10-CM | POA: Diagnosis not present

## 2018-09-25 DIAGNOSIS — R9431 Abnormal electrocardiogram [ECG] [EKG]: Secondary | ICD-10-CM | POA: Diagnosis not present

## 2018-09-25 DIAGNOSIS — I4891 Unspecified atrial fibrillation: Secondary | ICD-10-CM | POA: Diagnosis not present

## 2018-09-25 DIAGNOSIS — R0602 Shortness of breath: Secondary | ICD-10-CM | POA: Diagnosis not present

## 2018-09-25 DIAGNOSIS — I2510 Atherosclerotic heart disease of native coronary artery without angina pectoris: Secondary | ICD-10-CM | POA: Diagnosis not present

## 2018-09-25 DIAGNOSIS — F17200 Nicotine dependence, unspecified, uncomplicated: Secondary | ICD-10-CM | POA: Diagnosis not present

## 2018-09-25 NOTE — Telephone Encounter (Signed)
Post procedure phone call.  LM 

## 2018-09-26 DIAGNOSIS — I248 Other forms of acute ischemic heart disease: Secondary | ICD-10-CM | POA: Insufficient documentation

## 2018-09-26 DIAGNOSIS — J42 Unspecified chronic bronchitis: Secondary | ICD-10-CM | POA: Insufficient documentation

## 2018-09-28 MED FILL — Medication: INTRATHECAL | Qty: 1 | Status: AC

## 2018-10-02 DIAGNOSIS — Z7901 Long term (current) use of anticoagulants: Secondary | ICD-10-CM | POA: Diagnosis not present

## 2018-10-02 DIAGNOSIS — I10 Essential (primary) hypertension: Secondary | ICD-10-CM | POA: Diagnosis not present

## 2018-10-02 DIAGNOSIS — E7800 Pure hypercholesterolemia, unspecified: Secondary | ICD-10-CM | POA: Diagnosis not present

## 2018-10-02 DIAGNOSIS — I480 Paroxysmal atrial fibrillation: Secondary | ICD-10-CM | POA: Diagnosis not present

## 2018-10-02 DIAGNOSIS — F17210 Nicotine dependence, cigarettes, uncomplicated: Secondary | ICD-10-CM | POA: Diagnosis not present

## 2018-10-02 DIAGNOSIS — Z79899 Other long term (current) drug therapy: Secondary | ICD-10-CM | POA: Diagnosis not present

## 2018-10-02 DIAGNOSIS — I2510 Atherosclerotic heart disease of native coronary artery without angina pectoris: Secondary | ICD-10-CM | POA: Diagnosis not present

## 2018-10-02 DIAGNOSIS — I4892 Unspecified atrial flutter: Secondary | ICD-10-CM | POA: Diagnosis not present

## 2018-10-05 DIAGNOSIS — I10 Essential (primary) hypertension: Secondary | ICD-10-CM | POA: Diagnosis not present

## 2018-10-05 DIAGNOSIS — F331 Major depressive disorder, recurrent, moderate: Secondary | ICD-10-CM | POA: Diagnosis not present

## 2018-10-05 DIAGNOSIS — I4891 Unspecified atrial fibrillation: Secondary | ICD-10-CM | POA: Insufficient documentation

## 2018-10-05 DIAGNOSIS — E039 Hypothyroidism, unspecified: Secondary | ICD-10-CM | POA: Diagnosis not present

## 2018-10-08 DIAGNOSIS — I2510 Atherosclerotic heart disease of native coronary artery without angina pectoris: Secondary | ICD-10-CM | POA: Diagnosis not present

## 2018-10-08 DIAGNOSIS — E7800 Pure hypercholesterolemia, unspecified: Secondary | ICD-10-CM | POA: Diagnosis not present

## 2018-10-08 DIAGNOSIS — I484 Atypical atrial flutter: Secondary | ICD-10-CM | POA: Diagnosis not present

## 2018-10-08 DIAGNOSIS — I480 Paroxysmal atrial fibrillation: Secondary | ICD-10-CM | POA: Diagnosis not present

## 2018-10-08 DIAGNOSIS — I10 Essential (primary) hypertension: Secondary | ICD-10-CM | POA: Diagnosis not present

## 2018-10-09 DIAGNOSIS — E7800 Pure hypercholesterolemia, unspecified: Secondary | ICD-10-CM | POA: Diagnosis not present

## 2018-10-09 DIAGNOSIS — I480 Paroxysmal atrial fibrillation: Secondary | ICD-10-CM | POA: Diagnosis not present

## 2018-10-16 DIAGNOSIS — E782 Mixed hyperlipidemia: Secondary | ICD-10-CM | POA: Diagnosis not present

## 2018-10-16 DIAGNOSIS — I10 Essential (primary) hypertension: Secondary | ICD-10-CM | POA: Diagnosis not present

## 2018-10-16 DIAGNOSIS — K210 Gastro-esophageal reflux disease with esophagitis: Secondary | ICD-10-CM | POA: Diagnosis not present

## 2018-10-16 DIAGNOSIS — J449 Chronic obstructive pulmonary disease, unspecified: Secondary | ICD-10-CM | POA: Diagnosis not present

## 2018-10-16 DIAGNOSIS — R42 Dizziness and giddiness: Secondary | ICD-10-CM | POA: Diagnosis not present

## 2018-10-16 DIAGNOSIS — E039 Hypothyroidism, unspecified: Secondary | ICD-10-CM | POA: Diagnosis not present

## 2018-10-21 DIAGNOSIS — I4892 Unspecified atrial flutter: Secondary | ICD-10-CM | POA: Diagnosis not present

## 2018-10-21 DIAGNOSIS — C3491 Malignant neoplasm of unspecified part of right bronchus or lung: Secondary | ICD-10-CM | POA: Diagnosis not present

## 2018-10-21 DIAGNOSIS — Z6824 Body mass index (BMI) 24.0-24.9, adult: Secondary | ICD-10-CM | POA: Diagnosis not present

## 2018-10-21 DIAGNOSIS — F331 Major depressive disorder, recurrent, moderate: Secondary | ICD-10-CM | POA: Diagnosis not present

## 2018-10-21 DIAGNOSIS — Z0001 Encounter for general adult medical examination with abnormal findings: Secondary | ICD-10-CM | POA: Diagnosis not present

## 2018-10-21 DIAGNOSIS — R000 Tachycardia, unspecified: Secondary | ICD-10-CM | POA: Diagnosis not present

## 2018-10-21 DIAGNOSIS — F17210 Nicotine dependence, cigarettes, uncomplicated: Secondary | ICD-10-CM | POA: Diagnosis not present

## 2018-10-21 DIAGNOSIS — G252 Other specified forms of tremor: Secondary | ICD-10-CM | POA: Diagnosis not present

## 2018-10-29 ENCOUNTER — Other Ambulatory Visit (HOSPITAL_COMMUNITY): Payer: Self-pay | Admitting: *Deleted

## 2018-10-29 DIAGNOSIS — C029 Malignant neoplasm of tongue, unspecified: Secondary | ICD-10-CM

## 2018-10-29 DIAGNOSIS — C3411 Malignant neoplasm of upper lobe, right bronchus or lung: Secondary | ICD-10-CM

## 2018-10-29 DIAGNOSIS — C3490 Malignant neoplasm of unspecified part of unspecified bronchus or lung: Secondary | ICD-10-CM

## 2018-10-30 ENCOUNTER — Other Ambulatory Visit: Payer: Self-pay

## 2018-10-30 ENCOUNTER — Encounter (HOSPITAL_COMMUNITY): Payer: Self-pay

## 2018-10-30 ENCOUNTER — Inpatient Hospital Stay (HOSPITAL_COMMUNITY): Payer: PPO | Attending: Hematology

## 2018-10-30 ENCOUNTER — Ambulatory Visit (HOSPITAL_COMMUNITY)
Admission: RE | Admit: 2018-10-30 | Discharge: 2018-10-30 | Disposition: A | Discharge status: Home / Self Care | Payer: PPO | Source: Ambulatory Visit | Attending: Hematology | Admitting: Hematology

## 2018-10-30 DIAGNOSIS — Z7901 Long term (current) use of anticoagulants: Secondary | ICD-10-CM | POA: Diagnosis not present

## 2018-10-30 DIAGNOSIS — I10 Essential (primary) hypertension: Secondary | ICD-10-CM | POA: Insufficient documentation

## 2018-10-30 DIAGNOSIS — E7800 Pure hypercholesterolemia, unspecified: Secondary | ICD-10-CM | POA: Diagnosis not present

## 2018-10-30 DIAGNOSIS — C3490 Malignant neoplasm of unspecified part of unspecified bronchus or lung: Secondary | ICD-10-CM

## 2018-10-30 DIAGNOSIS — F17210 Nicotine dependence, cigarettes, uncomplicated: Secondary | ICD-10-CM | POA: Insufficient documentation

## 2018-10-30 DIAGNOSIS — K219 Gastro-esophageal reflux disease without esophagitis: Secondary | ICD-10-CM | POA: Diagnosis not present

## 2018-10-30 DIAGNOSIS — G629 Polyneuropathy, unspecified: Secondary | ICD-10-CM | POA: Insufficient documentation

## 2018-10-30 DIAGNOSIS — C029 Malignant neoplasm of tongue, unspecified: Secondary | ICD-10-CM | POA: Insufficient documentation

## 2018-10-30 DIAGNOSIS — Z79899 Other long term (current) drug therapy: Secondary | ICD-10-CM | POA: Insufficient documentation

## 2018-10-30 DIAGNOSIS — C3411 Malignant neoplasm of upper lobe, right bronchus or lung: Secondary | ICD-10-CM | POA: Diagnosis not present

## 2018-10-30 DIAGNOSIS — E039 Hypothyroidism, unspecified: Secondary | ICD-10-CM | POA: Insufficient documentation

## 2018-10-30 DIAGNOSIS — F329 Major depressive disorder, single episode, unspecified: Secondary | ICD-10-CM | POA: Insufficient documentation

## 2018-10-30 LAB — COMPREHENSIVE METABOLIC PANEL
ALT: 13 U/L (ref 0–44)
AST: 18 U/L (ref 15–41)
Albumin: 4 g/dL (ref 3.5–5.0)
Alkaline Phosphatase: 41 U/L (ref 38–126)
Anion gap: 7 (ref 5–15)
BUN: 14 mg/dL (ref 8–23)
CO2: 28 mmol/L (ref 22–32)
Calcium: 8.8 mg/dL — ABNORMAL LOW (ref 8.9–10.3)
Chloride: 105 mmol/L (ref 98–111)
Creatinine, Ser: 0.99 mg/dL (ref 0.61–1.24)
GFR calc Af Amer: >60 mL/min (ref >60)
GFR calc non Af Amer: >60 mL/min (ref >60)
Glucose, Bld: 95 mg/dL (ref 70–99)
Potassium: 4.4 mmol/L (ref 3.5–5.1)
Sodium: 140 mmol/L (ref 135–145)
Total Bilirubin: 0.6 mg/dL (ref 0.3–1.2)
Total Protein: 6.9 g/dL (ref 6.5–8.1)

## 2018-10-30 LAB — CBC WITH DIFFERENTIAL/PLATELET
Abs Immature Granulocytes: 0.01 10*3/uL (ref 0.00–0.07)
Basophils Absolute: 0.1 10*3/uL (ref 0.0–0.1)
Basophils Relative: 2 %
Eosinophils Absolute: 0.3 10*3/uL (ref 0.0–0.5)
Eosinophils Relative: 5 %
HCT: 40.3 % (ref 39.0–52.0)
Hemoglobin: 12.9 g/dL — ABNORMAL LOW (ref 13.0–17.0)
Immature Granulocytes: 0 %
Lymphocytes Relative: 23 %
Lymphs Abs: 1.1 10*3/uL (ref 0.7–4.0)
MCH: 30.5 pg (ref 26.0–34.0)
MCHC: 32 g/dL (ref 30.0–36.0)
MCV: 95.3 fL (ref 80.0–100.0)
Monocytes Absolute: 0.5 10*3/uL (ref 0.1–1.0)
Monocytes Relative: 11 %
Neutro Abs: 2.8 10*3/uL (ref 1.7–7.7)
Neutrophils Relative %: 59 %
Platelets: 184 10*3/uL (ref 150–400)
RBC: 4.23 MIL/uL (ref 4.22–5.81)
RDW: 14.8 % (ref 11.5–15.5)
WBC: 4.7 10*3/uL (ref 4.0–10.5)
nRBC: 0 % (ref 0.0–0.2)

## 2018-10-30 LAB — TSH: TSH: 3.894 u[IU]/mL (ref 0.350–4.500)

## 2018-10-30 LAB — LACTATE DEHYDROGENASE: LDH: 136 U/L (ref 98–192)

## 2018-10-30 MED ORDER — IOHEXOL 300 MG/ML  SOLN
75.0000 mL | Freq: Once | INTRAMUSCULAR | Status: AC | PRN
  Administered 2018-10-30: 13:00:00 75 mL via INTRAVENOUS

## 2018-11-03 ENCOUNTER — Ambulatory Visit (HOSPITAL_COMMUNITY): Payer: PPO | Admitting: Hematology

## 2018-11-04 DIAGNOSIS — E785 Hyperlipidemia, unspecified: Secondary | ICD-10-CM | POA: Diagnosis not present

## 2018-11-04 DIAGNOSIS — I10 Essential (primary) hypertension: Secondary | ICD-10-CM | POA: Diagnosis not present

## 2018-11-05 ENCOUNTER — Ambulatory Visit (HOSPITAL_COMMUNITY): Payer: PPO | Admitting: Hematology

## 2018-11-06 DIAGNOSIS — Z01818 Encounter for other preprocedural examination: Secondary | ICD-10-CM | POA: Diagnosis not present

## 2018-11-09 DIAGNOSIS — I4891 Unspecified atrial fibrillation: Secondary | ICD-10-CM | POA: Diagnosis not present

## 2018-11-09 DIAGNOSIS — I2510 Atherosclerotic heart disease of native coronary artery without angina pectoris: Secondary | ICD-10-CM | POA: Diagnosis not present

## 2018-11-09 DIAGNOSIS — I10 Essential (primary) hypertension: Secondary | ICD-10-CM | POA: Diagnosis not present

## 2018-11-09 DIAGNOSIS — F17200 Nicotine dependence, unspecified, uncomplicated: Secondary | ICD-10-CM | POA: Diagnosis not present

## 2018-11-09 DIAGNOSIS — E039 Hypothyroidism, unspecified: Secondary | ICD-10-CM | POA: Diagnosis not present

## 2018-11-09 DIAGNOSIS — Z79899 Other long term (current) drug therapy: Secondary | ICD-10-CM | POA: Diagnosis not present

## 2018-11-09 DIAGNOSIS — I484 Atypical atrial flutter: Secondary | ICD-10-CM | POA: Diagnosis not present

## 2018-11-09 DIAGNOSIS — Z7901 Long term (current) use of anticoagulants: Secondary | ICD-10-CM | POA: Diagnosis not present

## 2018-11-09 DIAGNOSIS — I480 Paroxysmal atrial fibrillation: Secondary | ICD-10-CM | POA: Diagnosis not present

## 2018-11-09 DIAGNOSIS — J449 Chronic obstructive pulmonary disease, unspecified: Secondary | ICD-10-CM | POA: Diagnosis not present

## 2018-11-19 DIAGNOSIS — I480 Paroxysmal atrial fibrillation: Secondary | ICD-10-CM | POA: Diagnosis not present

## 2018-11-19 DIAGNOSIS — F17200 Nicotine dependence, unspecified, uncomplicated: Secondary | ICD-10-CM | POA: Diagnosis not present

## 2018-11-19 DIAGNOSIS — I484 Atypical atrial flutter: Secondary | ICD-10-CM | POA: Diagnosis not present

## 2018-11-19 DIAGNOSIS — I2510 Atherosclerotic heart disease of native coronary artery without angina pectoris: Secondary | ICD-10-CM | POA: Diagnosis not present

## 2018-12-04 DIAGNOSIS — I10 Essential (primary) hypertension: Secondary | ICD-10-CM | POA: Diagnosis not present

## 2018-12-04 DIAGNOSIS — E782 Mixed hyperlipidemia: Secondary | ICD-10-CM | POA: Diagnosis not present

## 2018-12-09 ENCOUNTER — Other Ambulatory Visit: Payer: Self-pay

## 2018-12-09 MED ORDER — PAIN MANAGEMENT IT PUMP REFILL: 1.0000 | Freq: Once | INTRATHECAL | 0 refills | Status: AC

## 2018-12-16 ENCOUNTER — Telehealth: Payer: Self-pay | Admitting: *Deleted

## 2018-12-16 NOTE — Telephone Encounter (Signed)
No, the medication is only good for 3 months and Dr Dossie Arbour does not like his pumps to go any longer than that.  His pump medication has already been ordered for this date.

## 2018-12-22 ENCOUNTER — Encounter: Payer: PPO | Admitting: Pain Medicine

## 2018-12-22 DIAGNOSIS — I10 Essential (primary) hypertension: Secondary | ICD-10-CM | POA: Insufficient documentation

## 2018-12-22 DIAGNOSIS — E7800 Pure hypercholesterolemia, unspecified: Secondary | ICD-10-CM | POA: Insufficient documentation

## 2018-12-22 NOTE — Progress Notes (Signed)
Patient's Name: Darrell White  MRN: [id number]  Referring Provider: [name], MD  DOB: 1950-10-22  PCP: [name], MD  DOS: 12/23/2018  Note by: Gaspar Cola, MD  Service setting: Ambulatory outpatient  Specialty: Interventional Pain Management  Patient type: Established  Location: [facility] (AMB) Pain Management Facility  Visit type: Interventional Procedure   Primary Reason for Visit: Interventional Pain Management Treatment. CC: Back Pain and Hip Pain  Procedure:          Intrathecal Drug Delivery System (IDDS):  Type: Reservoir Refill [phone]) 10% rate increase Region: Abdominal Laterality: Right  Type of Pump: Medtronic Synchromed II Delivery Route: Intrathecal Type of Pain Treated: Neuropathic/Nociceptive Primary Medication Class: Opioid/opiate  Medication, Concentration, Infusion Program, & Delivery Rate: Please see scanned programming printout.   Indications: 1. Cancer-related pain   2. Chronic pain syndrome   3. Chronic low back pain (Primary Area of Pain) (Bilateral) (R>L)   4. Chronic lower extremity pain (Secondary Area of Pain) (Bilateral) (R>L)   5. 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 Failed back surgical syndrome   7. Presence of implanted infusion pump   8. Adjustment and management of infusion pump    Pain Assessment: Self-Reported Pain Score: 5 /10             Reported level is compatible with observation.        Pharmacotherapy Assessment  Analgesic: Oxycodone IR 10 mg, 1 tabl PO q 6 hrs (40 mg/day of oxycodone) MME/day: 60 mg/day.   Monitoring: Pharmacotherapy: No side-effects or adverse reactions reported. [place] PMP: PDMP reviewed during this encounter.       Compliance: No problems identified. Effectiveness: Clinically acceptable. Plan: Refer to "POC".  UDS:  Summary  Date Value Ref Range Status  03/26/2018 FINAL  Final    Comment:    ==================================================================== TOXASSURE SELECT  13 (MW) ==================================================================== Test                             Result       Flag       Units Drug Present and Declared for Prescription Verification   Oxycodone                      1772         EXPECTED   ng/mg creat   Oxymorphone                    >4115        EXPECTED   ng/mg creat   Noroxycodone                   >4115        EXPECTED   ng/mg creat   Noroxymorphone                 1803         EXPECTED   ng/mg creat    Sources of oxycodone are scheduled prescription medications.    Oxymorphone, noroxycodone, and noroxymorphone are expected    metabolites of oxycodone. Oxymorphone is also available as a    scheduled prescription medication. Drug Present not Declared for Prescription Verification   Morphine                       1085         UNEXPECTED ng/mg creat  Potential sources of large amounts of morphine in the absence of    codeine include administration of morphine or use of heroin. ==================================================================== Test                      Result    Flag   Units      Ref Range   Creatinine              243              mg/dL      >=20 ==================================================================== Declared Medications:  The flagging and interpretation on this report are based on the  following declared medications.  Unexpected results may arise from  inaccuracies in the declared medications.  **Note: The testing scope of this panel includes these medications:  Oxycodone  **Note: The testing scope of this panel does not include following  reported medications:  Acetaminophen (Tylenol)  Atorvastatin (Lipitor)  Docusate  Gabapentin (Neurontin)  Levothyroxine  Mirtazapine  Omeprazole  Sotalol  Tiotropium  Trazodone ==================================================================== For clinical consultation, please call [phone]. ====================================================================    Intrathecal Pump Therapy Assessment  Manufacturer: Medtronic Synchromed Type: Programmable Volume: 40 mL reservoir MRI compatibility: Yes   Drug content:  Primary Medication Class: Opioid Primary Medication:PF-Morphine(20 mg/mL) Secondary Medication:PF-Bupivacaine(20 mg/mL) Other Medication: see pump readout   Programming:  Type: Simple continuous. See pump readout for details.   Changes:  Medication Change: None at this point Rate Change: No change in rate  Reported side-effects or adverse reactions: None reported  Effectiveness: Described as relatively effective, allowing for increase in activities of daily living (ADL) Clinically meaningful improvement in function (CMIF): Sustained CMIF goals met  Plan: Pump refill today  Pre-op Assessment:  Darrell White is a 68 y.o. (year old), male patient, seen today for interventional treatment. He  has a past surgical history that includes Mouth surgery; Cervical disc surgery; Shoulder arthroscopy; Back surgery; Spinal fusion; Spine surgery; Knee arthroscopy; Cataract extraction w/PHACO (Left, 04/09/2012); Direct laryngoscopy (N/A, 03/26/2016); Excision of tongue lesion (Right, 03/26/2016); OTHER SURGICAL HISTORY; and CT LUNG SCREENING (06/16/2017). Darrell White has a current medication list which includes the following prescription(s): acetaminophen, albuterol, amiodarone, atorvastatin, diclofenac sodium, docusate calcium, eliquis, gabapentin, levothyroxine, metoprolol succinate, mirtazapine, omeprazole, oxycodone hcl, oxycodone hcl, oxycodone hcl, pantoprazole, sotalol, tiotropium, trazodone, atorvastatin, levothyroxine, and prednisone. His primarily concern today is the Back Pain and Hip Pain  Initial Vital Signs:  Pulse/HCG Rate: 69  Temp: (!) 97.2 F (36.2 C) Resp: 18 BP: 116/64 SpO2: 97 %  BMI: Estimated body mass index is 22.81 kg/m as calculated from  the following:   Height as of this encounter: '5\' 8"'$  (1.727 m).   Weight as of this encounter: 150 lb (68 kg).  Risk Assessment: Allergies: Reviewed. He is allergic to nsaids.  Allergy Precautions: None required Coagulopathies: Reviewed. None identified.  Blood-thinner therapy: None at this time Active Infection(s): Reviewed. None identified. Mr. Chui is afebrile  Site Confirmation: Mr. Watling was asked to confirm the procedure and laterality before marking the site Procedure checklist: Completed Consent: Before the procedure and under the influence of no sedative(s), amnesic(s), or anxiolytics, the patient was informed of the treatment options, risks and possible complications. To fulfill our ethical and legal obligations, as recommended by the American Medical Association's Code of Ethics, I have informed the patient of my clinical impression; the nature and purpose of the treatment or procedure; the risks, benefits, and possible complications of the intervention; the alternatives,  including doing nothing; the risk(s) and benefit(s) of the alternative treatment(s) or procedure(s); and the risk(s) and benefit(s) of doing nothing.  Mr. Foerster was provided with information about the general risks and possible complications associated with most interventional procedures. These include, but are not limited to: failure to achieve desired goals, infection, bleeding, organ or nerve damage, allergic reactions, paralysis, and/or death.  In addition, he was informed of those risks and possible complications associated to this particular procedure, which include, but are not limited to: damage to the implant; failure to decrease pain; local, systemic, or serious CNS infections, intraspinal abscess with possible cord compression and paralysis, or life-threatening such as meningitis; bleeding; organ damage; nerve injury or damage with subsequent sensory, motor, and/or autonomic system dysfunction, resulting in  transient or permanent pain, numbness, and/or weakness of one or several areas of the body; allergic reactions, either minor or major life-threatening, such as anaphylactic or anaphylactoid reactions.  Furthermore, Mr. Vassar was informed of those risks and complications associated with the medications. These include, but are not limited to: allergic reactions (i.e.: anaphylactic or anaphylactoid reactions); endorphine suppression; bradycardia and/or hypotension; water retention and/or peripheral vascular relaxation leading to lower extremity edema and possible stasis ulcers; respiratory depression and/or shortness of breath; decreased metabolic rate leading to weight gain; swelling or edema; medication-induced neural toxicity; particulate matter embolism and blood vessel occlusion with resultant organ, and/or nervous system infarction; and/or intrathecal granuloma formation with possible spinal cord compression and permanent paralysis.  Before refilling the pump Mr. Goren was informed that some of the medications used in the devise may not be FDA approved for such use and therefore it constitutes an off-label use of the medications.  Finally, he was informed that Medicine is not an exact science; therefore, there is also the possibility of unforeseen or unpredictable risks and/or possible complications that may result in a catastrophic outcome. The patient indicated having understood very clearly. We have given the patient no guarantees and we have made no promises. Enough time was given to the patient to ask questions, all of which were answered to the patient's satisfaction. Mr. Som has indicated that he wanted to continue with the procedure. Attestation: I, the ordering provider, attest that I have discussed with the patient the benefits, risks, side-effects, alternatives, likelihood of achieving goals, and potential problems during recovery for the procedure that I have provided informed consent. Date   Time: 12/23/2018  1:09 PM  Pre-Procedure Preparation:  Monitoring: As per clinic protocol. Respiration, ETCO2, SpO2, BP, heart rate and rhythm monitor placed and checked for adequate function Safety Precautions: Patient was assessed for positional comfort and pressure points before starting the procedure. Time-out: I initiated and conducted the "Time-out" before starting the procedure, as per protocol. The patient was asked to participate by confirming the accuracy of the "Time Out" information. Verification of the correct person, site, and procedure were performed and confirmed by me, the nursing staff, and the patient. "Time-out" conducted as per Joint Commission's Universal Protocol (UP.01.01.01). Time: 1310  Description of Procedure:          Position: Supine Target Area: Central-port of intrathecal pump. Approach: Anterior, 90 degree angle approach. Area Prepped: Entire Area around the pump implant. Prepping solution: DuraPrep (Iodine Povacrylex [0.7% available iodine] and Isopropyl Alcohol, 74% w/w) Safety Precautions: Aspiration looking for blood return was conducted prior to all injections. At no point did we inject any substances, as a needle was being advanced. No attempts were made at seeking any paresthesias.  Safe injection practices and needle disposal techniques used. Medications properly checked for expiration dates. SDV (single dose vial) medications used. Description of the Procedure: Protocol guidelines were followed. Two nurses trained to do implant refills were present during the entire procedure. The refill medication was checked by both healthcare providers as well as the patient. The patient was included in the "Time-out" to verify the medication. The patient was placed in position. The pump was identified. The area was prepped in the usual manner. The sterile template was positioned over the pump, making sure the side-port location matched that of the pump. Both, the pump and  the template were held for stability. The needle provided in the Medtronic Kit was then introduced thru the center of the template and into the central port. The pump content was aspirated and discarded volume documented. The new medication was slowly infused into the pump, thru the filter, making sure to avoid overpressure of the device. The needle was then removed and the area cleansed, making sure to leave some of the prepping solution back to take advantage of its long term bactericidal properties. The pump was interrogated and programmed to reflect the correct medication, volume, and dosage. The program was printed and taken to the physician for approval. Once checked and signed by the physician, a copy was provided to the patient and another scanned into the EMR. Vitals:   12/23/18 1309  BP: 116/64  Pulse: 69  Resp: 18  Temp: (!) 97.2 F (36.2 C)  SpO2: 97%  Weight: 150 lb (68 kg)  Height: '5\' 8"'$  (1.727 m)    Start Time: 1310 hrs. End Time: 1322 hrs. Materials & Medications: Medtronic Refill Kit Medication(s): Please see chart orders for details.  Imaging Guidance:          Type of Imaging Technique: None used Indication(s): N/A Exposure Time: No patient exposure Contrast: None used. Fluoroscopic Guidance: N/A Ultrasound Guidance: N/A Interpretation: N/A  Antibiotic Prophylaxis:   Anti-infectives (From admission, onward)   None     Indication(s): None identified  Post-operative Assessment:  Post-procedure Vital Signs:  Pulse/HCG Rate: 69  Temp: (!) 97.2 F (36.2 C) Resp: 18 BP: 116/64 SpO2: 97 %  EBL: None  Complications: No immediate post-treatment complications observed by team, or reported by patient.  Note: The patient tolerated the entire procedure well. A repeat set of vitals were taken after the procedure and the patient was kept under observation following institutional policy, for this type of procedure. Post-procedural neurological assessment was  performed, showing return to baseline, prior to discharge. The patient was provided with post-procedure discharge instructions, including a section on how to identify potential problems. Should any problems arise concerning this procedure, the patient was given instructions to immediately contact us, at any time, without hesitation. In any case, we plan to contact the patient by telephone for a follow-up status report regarding this interventional procedure.  Comments:  No additional relevant information.  Plan of Care  Orders:  Orders Placed This Encounter  Procedures  . Pump Refill (Today)    Maintain Protocol by having two(2) healthcare providers during procedure and programming.    Scheduling Instructions:     Please refill intrathecal pump today.    Order Specific Question:   Where will this procedure be performed?    Answer:   [facility]  . Pump Refill (Schedule Return)    Whenever possible schedule on a procedure today.    Standing Status:   Future  Standing Expiration Date:   05/21/2019    Scheduling Instructions:     Please schedule intrathecal pump refill based on pump programming. Avoid schedule intervals of more than 120 days (4 months).    Order Specific Question:   Where will this procedure be performed?    Answer:   [facility]  . Pump Adjustment (Today)    Follow programming protocol by having two(2) healthcare providers present during programming.    Scheduling Instructions:     Please perform the following adjustment: Increase rate by 10%.    Order Specific Question:   Where will this procedure be performed?    Answer:   [facility]  . Consent: Pump Refill    Provider Attestation: I, [place] Dossie Arbour, MD, (Pain Management Specialist), the physician/practitioner, attest that I have discussed with the patient the benefits, risks, side effects, alternatives, likelihood of achieving goals and potential problems during recovery for the  procedure that I have provided informed consent.    Scheduling Instructions:     Procedure: Intrathecal Pump Refill     Attending Physician: Beatriz Chancellor A. Dossie Arbour, MD     Indications: Chronic Pain Syndrome (G89.4)     Transcribe to consent form and obtain patient signature.   Chronic Opioid Analgesic:  Oxycodone IR 10 mg, 1 tabl PO q 6 hrs (40 mg/day of oxycodone) MME/day: 60 mg/day.   Medications ordered for procedure: Meds ordered this encounter  Medications  . Oxycodone HCl 10 MG TABS    Sig: Take 1 tablet (10 mg total) by mouth every 6 (six) hours as needed. Must last 30 days    Dispense:  120 tablet    Refill:  0    Chronic Pain: STOP Act (Not applicable) Fill 1 day early if closed on refill date. Do not fill until: 12/23/2018. To last until: 01/22/2019. Avoid benzodiazepines within 8 hours of opioids  . Oxycodone HCl 10 MG TABS    Sig: Take 1 tablet (10 mg total) by mouth every 6 (six) hours as needed. Must last 30 days    Dispense:  120 tablet    Refill:  0    Chronic Pain: STOP Act (Not applicable) Fill 1 day early if closed on refill date. Do not fill until: 01/22/2019. To last until: 02/21/2019. Avoid benzodiazepines within 8 hours of opioids  . Oxycodone HCl 10 MG TABS    Sig: Take 1 tablet (10 mg total) by mouth every 6 (six) hours as needed. Must last 30 days    Dispense:  120 tablet    Refill:  0    Chronic Pain: STOP Act (Not applicable) Fill 1 day early if closed on refill date. Do not fill until: 02/21/2019. To last until: 03/23/2019. Avoid benzodiazepines within 8 hours of opioids   Medications administered: Sabas Sous had no medications administered during this visit.  See the medical record for exact dosing, route, and time of administration.  Follow-up plan:   Return in about 3 months (around 03/22/2019) for Pump Refill (Max:57mo, in addition, (MM).       Interventional management options:  Considering: Diagnostic bilateral intra-articular hip joint  injection Therapeutic/palliative intrathecal pump refill(s) Diagnostic bilateral lumbar facet block#1 Possible bilateral lumbar facet RFA Diagnostic caudal epidural steroid injection plus diagnostic epidurogram Possible Racz procedure Diagnostic left cervical epidural steroid injection Diagnosticleft cervical facet block Possible left cervical facet RFA   Palliative PRN treatment(s): Diagnostic bilateral lumbar facet block#1under fluoroscopic guidance and IV sedation Therapeutic/palliative intrathecal pump refill(s)  Recent Visits Date Type Provider Dept  09/24/18 Procedure visit Milinda Pointer, MD [facility]  Showing recent visits within past 90 days and meeting all other requirements   Today's Visits Date Type Provider Dept  12/23/18 Procedure visit Milinda Pointer, MD [facility]  Showing today's visits and meeting all other requirements   Future Appointments No visits were found meeting these conditions.  Showing future appointments within next 90 days and meeting all other requirements   Disposition: Discharge home  Discharge Date & Time: 12/23/2018; 1325 hrs.   Primary Care Physician: [name], MD Location: [facility] Note by: Gaspar Cola, MD Date: 12/23/2018; Time: 1:59 PM  Disclaimer:  Medicine is not an Chief Strategy Officer. The only guarantee in medicine is that nothing is guaranteed. It is important to note that the decision to proceed with this intervention was based on the information collected from the patient. The Data and conclusions were drawn from the patient's questionnaire, the interview, and the physical examination. Because the information was provided in large part by the patient, it cannot be guaranteed that it has not been purposely or unconsciously manipulated. Every effort has been made to obtain as much relevant data as possible for this evaluation. It is important to  note that the conclusions that lead to this procedure are derived in large part from the available data. Always take into account that the treatment will also be dependent on availability of resources and existing treatment guidelines, considered by other Pain Management Practitioners as being common knowledge and practice, at the time of the intervention. For Medico-Legal purposes, it is also important to point out that variation in procedural techniques and pharmacological choices are the acceptable norm. The indications, contraindications, technique, and results of the above procedure should only be interpreted and judged by a Board-Certified Interventional Pain Specialist with extensive familiarity and expertise in the same exact procedure and technique.

## 2018-12-23 ENCOUNTER — Other Ambulatory Visit: Payer: Self-pay

## 2018-12-23 ENCOUNTER — Ambulatory Visit: Payer: PPO | Attending: Pain Medicine | Admitting: Pain Medicine

## 2018-12-23 VITALS — BP 116/64 | HR 69 | Temp 97.2°F | Resp 18 | Ht 68.0 in | Wt 150.0 lb

## 2018-12-23 DIAGNOSIS — Z95828 Presence of other vascular implants and grafts: Secondary | ICD-10-CM | POA: Diagnosis not present

## 2018-12-23 DIAGNOSIS — M5441 Lumbago with sciatica, right side: Secondary | ICD-10-CM | POA: Insufficient documentation

## 2018-12-23 DIAGNOSIS — M79604 Pain in right leg: Secondary | ICD-10-CM | POA: Insufficient documentation

## 2018-12-23 DIAGNOSIS — G893 Neoplasm related pain (acute) (chronic): Secondary | ICD-10-CM | POA: Diagnosis not present

## 2018-12-23 DIAGNOSIS — M542 Cervicalgia: Secondary | ICD-10-CM | POA: Insufficient documentation

## 2018-12-23 DIAGNOSIS — M961 Postlaminectomy syndrome, not elsewhere classified: Secondary | ICD-10-CM

## 2018-12-23 DIAGNOSIS — M5442 Lumbago with sciatica, left side: Secondary | ICD-10-CM | POA: Diagnosis not present

## 2018-12-23 DIAGNOSIS — Z451 Encounter for adjustment and management of infusion pump: Secondary | ICD-10-CM | POA: Diagnosis not present

## 2018-12-23 DIAGNOSIS — G894 Chronic pain syndrome: Secondary | ICD-10-CM | POA: Diagnosis not present

## 2018-12-23 DIAGNOSIS — G8929 Other chronic pain: Secondary | ICD-10-CM

## 2018-12-23 DIAGNOSIS — M79605 Pain in left leg: Secondary | ICD-10-CM | POA: Insufficient documentation

## 2018-12-23 MED ORDER — OXYCODONE HCL 10 MG PO TABS: 10.0000 mg | ORAL_TABLET | Freq: Four times a day (QID) | ORAL | 0 refills | Status: DC | PRN

## 2018-12-23 NOTE — Progress Notes (Signed)
Nursing Pain Medication Assessment:  Safety precautions to be maintained throughout the outpatient stay will include: orient to surroundings, keep bed in low position, maintain call bell within reach at all times, provide assistance with transfer out of bed and ambulation.  Medication Inspection Compliance: Mr. Donahoe did not comply with our request to bring his pills to be counted. He was reminded that bringing the medication bottles, even when empty, is a requirement.  Medication: None brought in. Pill/Patch Count: None available to be counted. Bottle Appearance: No container available. Did not bring bottle(s) to appointment. Filled Date: N/A Last Medication intake:  Today

## 2018-12-23 NOTE — Patient Instructions (Addendum)
____________________________________________________________________________________________  Medication Rules  Purpose: To inform patients, and their family members, of our rules and regulations.  Applies to: All patients receiving prescriptions (written or electronic).  Pharmacy of record: Pharmacy where electronic prescriptions will be sent. If written prescriptions are taken to a different pharmacy, please inform the nursing staff. The pharmacy listed in the electronic medical record should be the one where you would like electronic prescriptions to be sent.  Electronic prescriptions: In compliance with the [place] (STOP) Act of 2017 (Session Lanny Cramp [phone]), effective February 04, 2018, all controlled substances must be electronically prescribed. Calling prescriptions to the pharmacy will cease to exist.  Prescription refills: Only during scheduled appointments. Applies to all prescriptions.  NOTE: The following applies primarily to controlled substances (Opioid* Pain Medications).   Patient's responsibilities: 1. Pain Pills: Bring all pain pills to every appointment (except for procedure appointments). 2. Pill Bottles: Bring pills in original pharmacy bottle. Always bring the newest bottle. Bring bottle, even if empty. 3. Medication refills: You are responsible for knowing and keeping track of what medications you take and those you need refilled. The day before your appointment: write a list of all prescriptions that need to be refilled. The day of the appointment: give the list to the admitting nurse. Prescriptions will be written only during appointments. No prescriptions will be written on procedure days. If you forget a medication: it will not be "Called in", "Faxed", or "electronically sent". You will need to get another appointment to get these prescribed. No early refills. Do not call asking to have your prescription filled  early. 4. Prescription Accuracy: You are responsible for carefully inspecting your prescriptions before leaving our office. Have the discharge nurse carefully go over each prescription with you, before taking them home. Make sure that your name is accurately spelled, that your address is correct. Check the name and dose of your medication to make sure it is accurate. Check the number of pills, and the written instructions to make sure they are clear and accurate. Make sure that you are given enough medication to last until your next medication refill appointment. 5. Taking Medication: Take medication as prescribed. When it comes to controlled substances, taking less pills or less frequently than prescribed is permitted and encouraged. Never take more pills than instructed. Never take medication more frequently than prescribed.  6. Inform other Doctors: Always inform, all of your healthcare providers, of all the medications you take. 7. Pain Medication from other Providers: You are not allowed to accept any additional pain medication from any other Doctor or Healthcare provider. There are two exceptions to this rule. (see below) In the event that you require additional pain medication, you are responsible for notifying us, as stated below. 8. Medication Agreement: You are responsible for carefully reading and following our Medication Agreement. This must be signed before receiving any prescriptions from our practice. Safely store a copy of your signed Agreement. Violations to the Agreement will result in no further prescriptions. (Additional copies of our Medication Agreement are available upon request.) 9. Laws, Rules, & Regulations: All patients are expected to follow all Federal and [employer], [employer], Rules, [employer] [facility] [employer]. Ignorance of the Laws does not constitute a valid excuse. The use of any illegal substances is prohibited. 10. Adopted CDC guidelines & recommendations: Target dosing levels will be  at or below 60 MME/day. Use of benzodiazepines** is not recommended.  Exceptions: There are only two exceptions to the rule of not  receiving pain medications from other Healthcare Providers. 1. Exception #1 (Emergencies): In the event of an emergency (i.e.: accident requiring emergency care), you are allowed to receive additional pain medication. However, you are responsible for: As soon as you are able, call our office [phone], at any time of the day or night, and leave a message stating your name, the date and nature of the emergency, and the name and dose of the medication prescribed. In the event that your call is answered by a member of our staff, make sure to document and save the date, time, and the name of the person that took your information.  2. Exception #2 (Planned Surgery): In the event that you are scheduled by another doctor or dentist to have any type of surgery or procedure, you are allowed (for a period no longer than 30 days), to receive additional pain medication, for the acute post-op pain. However, in this case, you are responsible for picking up a copy of our "Post-op Pain Management for Surgeons" handout, and giving it to your surgeon or dentist. This document is available at our office, and does not require an appointment to obtain it. Simply go to our office during business hours (Monday-Thursday from 8:00 AM to 4:00 PM) (Friday 8:00 AM to 12:00 Noon) or if you have a scheduled appointment with [place], prior to your surgery, and ask for it by name. In addition, you will need to provide [place] with your name, name of your surgeon, type of surgery, and date of procedure or surgery.  *Opioid medications include: morphine, codeine, oxycodone, oxymorphone, hydrocodone, hydromorphone, meperidine, tramadol, tapentadol, buprenorphine, fentanyl, methadone. **Benzodiazepine medications include: diazepam (Valium), alprazolam (Xanax), clonazepam (Klonopine), lorazepam (Ativan), clorazepate  (Tranxene), chlordiazepoxide (Librium), estazolam (Prosom), oxazepam (Serax), temazepam (Restoril), triazolam (Halcion) (Last updated: 04/03/2017) ____________________________________________________________________________________________   ____________________________________________________________________________________________  Medication Recommendations and Reminders  Applies to: All patients receiving prescriptions (written and/or electronic).  Medication Rules & Regulations: These rules and regulations exist for your safety and that of others. They are not flexible and neither are we. Dismissing or ignoring them will be considered "non-compliance" with medication therapy, resulting in complete and irreversible termination of such therapy. (See document titled "Medication Rules" for more details.) In all conscience, because of safety reasons, we cannot continue providing a therapy where the patient does not follow instructions.  Pharmacy of record:   Definition: This is the pharmacy where your electronic prescriptions will be sent.   We do not endorse any particular pharmacy.  You are not restricted in your choice of pharmacy.  The pharmacy listed in the electronic medical record should be the one where you want electronic prescriptions to be sent.  If you choose to change pharmacy, simply notify our nursing staff of your choice of new pharmacy.  Recommendations:  Keep all of your pain medications in a safe place, under lock and key, even if you live alone.   After you fill your prescription, take 1 week's worth of pills and put them away in a safe place. You should keep a separate, properly labeled bottle for this purpose. The remainder should be kept in the original bottle. Use this as your primary supply, until it runs out. Once it's gone, then you know that you have 1 week's worth of medicine, and it is time to come in for a prescription refill. If you do this correctly, it  is unlikely that you will ever run out of medicine.  To make sure that the above recommendation works,  it is very important that you make sure your medication refill appointments are scheduled at least 1 week before you run out of medicine. To do this in an effective manner, make sure that you do not leave the office without scheduling your next medication management appointment. Always ask the nursing staff to show you in your prescription , when your medication will be running out. Then arrange for the receptionist to get you a return appointment, at least 7 days before you run out of medicine. Do not wait until you have 1 or 2 pills left, to come in. This is very poor planning and does not take into consideration that we may need to cancel appointments due to bad weather, sickness, or emergencies affecting our staff.  "Partial Fill": If for any reason your pharmacy does not have enough pills/tablets to completely fill or refill your prescription, do not allow for a "partial fill". You will need a separate prescription to fill the remaining amount, which we will not provide. If the reason for the partial fill is your insurance, you will need to talk to the pharmacist about payment alternatives for the remaining tablets, but again, do not accept a partial fill.  Prescription refills and/or changes in medication(s):   Prescription refills, and/or changes in dose or medication, will be conducted only during scheduled medication management appointments. (Applies to both, written and electronic prescriptions.)  No refills on procedure days. No medication will be changed or started on procedure days. No changes, adjustments, and/or refills will be conducted on a procedure day. Doing so will interfere with the diagnostic portion of the procedure.  No phone refills. No medications will be "called into the pharmacy".  No Fax refills.  No weekend refills.  No Holliday refills.  No after hours  refills.  Remember:  Business hours are:  Monday to Thursday 8:00 AM to 4:00 PM Provider's Schedule: Milinda Pointer, MD - Appointments are:  Medication management: Monday and Wednesday 8:00 AM to 4:00 PM Procedure day: Tuesday and Thursday 7:30 AM to 4:00 PM Gillis Santa, MD - Appointments are:  Medication management: Tuesday and Thursday 8:00 AM to 4:00 PM Procedure day: Monday and Wednesday 7:30 AM to 4:00 PM (Last update: 04/03/2017) ____________________________________________________________________________________________   ____________________________________________________________________________________________  CANNABIDIOL (AKA: CBD Oil or Pills)  Applies to: All patients receiving prescriptions of controlled substances (written and/or electronic).  General Information: Cannabidiol (CBD) was discovered in 2. It is one of some 113 identified cannabinoids in cannabis (Marijuana) plants, accounting for up to 40% of the plant's extract. As of 2018, preliminary clinical research on cannabidiol included studies of anxiety, cognition, movement disorders, and pain.  Cannabidiol is consummed in multiple ways, including inhalation of cannabis smoke or vapor, as an aerosol spray into the cheek, and by mouth. It may be supplied as CBD oil containing CBD as the active ingredient (no added tetrahydrocannabinol (THC) or terpenes), a full-plant CBD-dominant hemp extract oil, capsules, dried cannabis, or as a liquid solution. CBD is thought not have the same psychoactivity as THC, and may affect the actions of THC. Studies suggest that CBD may interact with different biological targets, including cannabinoid receptors and other neurotransmitter receptors. As of 2018 the mechanism of action for its biological effects has not been determined.  In the [place], cannabidiol has a limited approval by the Food and Drug Administration (FDA) for treatment of only two types of epilepsy  disorders. The side effects of long-term use of the drug include somnolence, decreased appetite, diarrhea,  fatigue, malaise, weakness, sleeping problems, and others.  CBD remains a Schedule I drug prohibited for any use.  Legality: Some manufacturers ship CBD products nationally, an illegal action which the FDA has not enforced in 2018, with CBD remaining the subject of an FDA investigational new drug evaluation, and is not considered legal as a dietary supplement or food ingredient as of December 2018. Federal illegality has made it difficult historically to conduct research on CBD. CBD is openly sold in head shops and health food stores in some states where such sales have not been explicitly legalized.  Warning: Because it is not FDA approved for general use or treatment of pain, it is not required to undergo the same manufacturing controls as prescription drugs.  This means that the available cannabidiol (CBD) may be contaminated with THC.  If this is the case, it will trigger a positive urine drug screen (UDS) test for cannabinoids (Marijuana).  Because a positive UDS for illicit substances is a violation of our medication agreement, your opioid analgesics (pain medicine) may be permanently discontinued. (Last update: 04/24/2017) ____________________________________________________________________________________________    Opioid Overdose Opioids are drugs that are often used to treat pain. Opioids include illegal drugs, such as heroin, as well as prescription pain medicines, such as codeine, morphine, hydrocodone, oxycodone, and fentanyl. An opioid overdose happens when you take too much of an opioid. An overdose may be intentional or accidental and can happen with any type of opioid. The effects of an overdose can be mild, dangerous, or even deadly. Opioid overdose is a medical emergency. What are the causes? This condition may be caused by:  Taking too much of an opioid on  purpose.  Taking too much of an opioid by accident.  Using two or more substances that contain opioids at the same time.  Taking an opioid with a substance that affects your heart, breathing, or blood pressure. These include alcohol, tranquilizers, sleeping pills, illegal drugs, and some over-the-counter medicines. This condition may also happen due to an error made by:  A health care provider who prescribes a medicine.  The pharmacist who fills the prescription order. What increases the risk? This condition is more likely in:  Children. They may be attracted to colorful pills. Because of a child's small size, even a small amount of a drug can be dangerous.  Older people. They may be taking many different drugs. Older people may have difficulty reading labels or remembering when they last took their medicine. They may also be more sensitive to the effects of opioids.  People with chronic medical conditions, especially heart, liver, kidney, or neurological diseases.  People who take an opioid for a long period of time.  People who use: ? Illegal drugs. IV heroin is especially dangerous. ? Other substances, including alcohol, while using an opioid.  People who have: ? A history of drug or alcohol abuse. ? Certain mental health conditions. ? A history of previous drug overdoses.  People who take opioids that are not prescribed for them. What are the signs or symptoms? Symptoms of this condition depend on the type of opioid and the amount that was taken. Common symptoms include:  Sleepiness or difficulty waking from sleep.  Decrease in attention.  Confusion.  Slurred speech.  Slowed breathing and a slow pulse (bradycardia).  Nausea and vomiting.  Abnormally small pupils. Signs and symptoms that require emergency treatment include:  Cold, clammy, and pale skin.  Blue lips and fingernails.  Vomiting.  Gurgling sounds in the  throat.  A pulse that is very slow or  difficult to detect.  Breathing that is very irregular, slow, noisy, or difficult to detect.  Limp body.  Inability to respond to speech or be awakened from sleep (stupor).  Seizures. How is this diagnosed? This condition is diagnosed based on your symptoms and medical history. It is important to tell your health care provider:  About all of the opioids that you took.  When you took the opioids.  Whether you were drinking alcohol or using marijuana, cocaine, or other drugs. Your health care provider will do a physical exam. This exam may include:  Checking and monitoring your heart rate and rhythm, breathing rate, temperature, and blood pressure (vital signs).  Measuring oxygen levels in your blood.  Checking for abnormally small pupils. You may also have blood tests or urine tests. You may have X-rays if you are having severe breathing problems. How is this treated? This condition requires immediate medical treatment and hospitalization. Treatment is given in the hospital intensive care (ICU) setting. Supporting your vital signs and your breathing is the first step in treating an opioid overdose. Treatment may also include:  Giving salts and minerals (electrolytes) along with fluids through an IV.  Inserting a breathing tube (endotracheal tube) in your airway to help you breathe if you cannot breathe on your own or you are in danger of not being able to breathe on your own.  Giving oxygen through a small tube under your nose.  Passing a tube through your nose and into your stomach (nasogastric tube, or NG tube) to empty your stomach.  Giving medicines that: ? Increase your blood pressure. ? Relieve nausea and vomiting. ? Relieve abdominal pain and cramping. ? Reverse the effects of the opioid (naloxone).  Monitoring your heart and oxygen levels.  Ongoing counseling and mental health support if you intentionally overdosed or used an illegal drug. Follow these instructions  at home:  Medicines  Take over-the-counter and prescription medicines only as told by your health care provider.  Always ask your health care provider about possible side effects and interactions of any new medicine that you start taking.  Keep a list of all the medicines that you take, including over-the-counter medicines. Bring this list with you to all your medical visits. General instructions  Drink enough fluid to keep your urine pale yellow.  Keep all follow-up visits as told by your health care provider. This is important. How is this prevented?  Read the drug inserts that come with your opioid pain medicines.  Take medicines only as told by your health care provider. Do not take more medicine than you are told. Do not take medicines more frequently than you are told.  Do not drink alcohol or take sedatives when taking opioids.  Do not use illegal or recreational drugs, including cocaine, ecstasy, and marijuana.  Do not take opioid medicines that are not prescribed for you.  Store all medicines in safety containers that are out of the reach of children.  Get help if you are struggling with: ? Alcohol or drug use. ? Depression or another mental health problem. ? Thoughts of hurting yourself or another person.  Keep the phone number of your local poison control center near your phone or in your mobile phone. In the [place] the hotline of the [facility] is (838)570-3039.  If you were prescribed naloxone, make sure you understand how to take it. Contact a health care provider if you:  Need help understanding how to take your pain medicines.  Feel your medicines are too strong.  Are concerned that your pain medicines are not working well for your pain.  Develop new symptoms or side effects when you are taking medicines. Get help right away if:  You or someone else is having symptoms of an opioid overdose. Get help even if you are not sure.  You  have serious thoughts about hurting yourself or others.  You have: ? Chest pain. ? Difficulty breathing. ? A loss of consciousness. These symptoms may represent a serious problem that is an emergency. Do not wait to see if the symptoms will go away. Get medical help right away. Call your local emergency services (911 in the [place] Do not drive yourself to the hospital. If you ever feel like you may hurt yourself or others, or have thoughts about taking your own life, get help right away. You can go to your nearest emergency department or call:  Your local emergency services (911 in the [place]  A suicide crisis helpline, such as the [employer] at 541-804-0766. This is open 24 hours a day. Summary  Opioids are drugs that are often used to treat pain. Opioids include illegal drugs, such as heroin, as well as prescription pain medicines.  An opioid overdose happens when you take too much of an opioid.  Overdoses can be intentional or accidental.  Opioid overdose is very dangerous. It is a life-threatening emergency.  If you or someone you know is experiencing an opioid overdose, get help right away. This information is not intended to replace advice given to you by your health care provider. Make sure you discuss any questions you have with your health care provider. Document Released: 02/29/2004 Document Revised: 01/08/2018 Document Reviewed: 01/08/2018 Elsevier Patient Education  2020 [employer].

## 2018-12-24 ENCOUNTER — Telehealth: Payer: Self-pay | Admitting: Pain Medicine

## 2018-12-24 NOTE — Telephone Encounter (Signed)
Patient called stating his wife got a call this morning, she tested positive for Covid-19. He was told to call and let us know. He was here for Pump Refill on Wed.

## 2018-12-25 ENCOUNTER — Other Ambulatory Visit: Payer: Self-pay

## 2018-12-25 DIAGNOSIS — Z20822 Contact with and (suspected) exposure to covid-19: Secondary | ICD-10-CM

## 2018-12-25 NOTE — Telephone Encounter (Signed)
Spoke with patient and informed him that Dr Dossie Arbour would like for him to get a Covid 19 test.  Patient states understanding and states that he will do it and let us know the results as soon as he gets them.

## 2018-12-28 LAB — NOVEL CORONAVIRUS, NAA: SARS-CoV-2, NAA: NOT DETECTED

## 2018-12-29 MED FILL — Medication: INTRATHECAL | Qty: 1 | Status: AC

## 2019-01-04 DIAGNOSIS — E782 Mixed hyperlipidemia: Secondary | ICD-10-CM | POA: Diagnosis not present

## 2019-01-04 DIAGNOSIS — E039 Hypothyroidism, unspecified: Secondary | ICD-10-CM | POA: Diagnosis not present

## 2019-01-26 DIAGNOSIS — I480 Paroxysmal atrial fibrillation: Secondary | ICD-10-CM | POA: Diagnosis not present

## 2019-01-26 DIAGNOSIS — E782 Mixed hyperlipidemia: Secondary | ICD-10-CM | POA: Insufficient documentation

## 2019-01-26 DIAGNOSIS — I10 Essential (primary) hypertension: Secondary | ICD-10-CM | POA: Diagnosis not present

## 2019-01-26 DIAGNOSIS — I2510 Atherosclerotic heart disease of native coronary artery without angina pectoris: Secondary | ICD-10-CM | POA: Diagnosis not present

## 2019-02-22 DIAGNOSIS — E782 Mixed hyperlipidemia: Secondary | ICD-10-CM | POA: Diagnosis not present

## 2019-02-22 DIAGNOSIS — K219 Gastro-esophageal reflux disease without esophagitis: Secondary | ICD-10-CM | POA: Diagnosis not present

## 2019-02-22 DIAGNOSIS — C3491 Malignant neoplasm of unspecified part of right bronchus or lung: Secondary | ICD-10-CM | POA: Diagnosis not present

## 2019-02-22 DIAGNOSIS — M1612 Unilateral primary osteoarthritis, left hip: Secondary | ICD-10-CM | POA: Diagnosis not present

## 2019-02-22 DIAGNOSIS — M545 Low back pain: Secondary | ICD-10-CM | POA: Diagnosis not present

## 2019-02-22 DIAGNOSIS — M19031 Primary osteoarthritis, right wrist: Secondary | ICD-10-CM | POA: Diagnosis not present

## 2019-02-22 DIAGNOSIS — E039 Hypothyroidism, unspecified: Secondary | ICD-10-CM | POA: Diagnosis not present

## 2019-02-22 DIAGNOSIS — Z23 Encounter for immunization: Secondary | ICD-10-CM | POA: Diagnosis not present

## 2019-02-22 DIAGNOSIS — F17210 Nicotine dependence, cigarettes, uncomplicated: Secondary | ICD-10-CM | POA: Diagnosis not present

## 2019-02-22 DIAGNOSIS — Z1212 Encounter for screening for malignant neoplasm of rectum: Secondary | ICD-10-CM | POA: Diagnosis not present

## 2019-02-22 DIAGNOSIS — F331 Major depressive disorder, recurrent, moderate: Secondary | ICD-10-CM | POA: Diagnosis not present

## 2019-02-22 DIAGNOSIS — Z6823 Body mass index (BMI) 23.0-23.9, adult: Secondary | ICD-10-CM | POA: Diagnosis not present

## 2019-02-22 DIAGNOSIS — R000 Tachycardia, unspecified: Secondary | ICD-10-CM | POA: Diagnosis not present

## 2019-02-22 DIAGNOSIS — M19041 Primary osteoarthritis, right hand: Secondary | ICD-10-CM | POA: Diagnosis not present

## 2019-02-22 DIAGNOSIS — R4582 Worries: Secondary | ICD-10-CM | POA: Diagnosis not present

## 2019-02-22 DIAGNOSIS — I4892 Unspecified atrial flutter: Secondary | ICD-10-CM | POA: Diagnosis not present

## 2019-03-01 ENCOUNTER — Other Ambulatory Visit: Payer: Self-pay

## 2019-03-01 MED ORDER — PAIN MANAGEMENT IT PUMP REFILL: 1.0000 | Freq: Once | INTRATHECAL | 0 refills | Status: AC

## 2019-03-03 DIAGNOSIS — M25552 Pain in left hip: Secondary | ICD-10-CM | POA: Diagnosis not present

## 2019-03-05 DIAGNOSIS — E7849 Other hyperlipidemia: Secondary | ICD-10-CM | POA: Diagnosis not present

## 2019-03-05 DIAGNOSIS — I10 Essential (primary) hypertension: Secondary | ICD-10-CM | POA: Diagnosis not present

## 2019-03-05 DIAGNOSIS — E039 Hypothyroidism, unspecified: Secondary | ICD-10-CM | POA: Diagnosis not present

## 2019-03-18 DIAGNOSIS — Z6824 Body mass index (BMI) 24.0-24.9, adult: Secondary | ICD-10-CM | POA: Diagnosis not present

## 2019-03-18 DIAGNOSIS — M545 Low back pain: Secondary | ICD-10-CM | POA: Diagnosis not present

## 2019-03-19 DIAGNOSIS — M1612 Unilateral primary osteoarthritis, left hip: Secondary | ICD-10-CM | POA: Insufficient documentation

## 2019-03-30 ENCOUNTER — Ambulatory Visit: Payer: PPO | Attending: Pain Medicine | Admitting: Pain Medicine

## 2019-03-30 ENCOUNTER — Encounter: Payer: Self-pay | Admitting: Pain Medicine

## 2019-03-30 ENCOUNTER — Other Ambulatory Visit: Payer: Self-pay

## 2019-03-30 VITALS — BP 129/75 | Temp 97.5°F | Ht 68.0 in | Wt 171.0 lb

## 2019-03-30 DIAGNOSIS — Z95828 Presence of other vascular implants and grafts: Secondary | ICD-10-CM

## 2019-03-30 DIAGNOSIS — M5136 Other intervertebral disc degeneration, lumbar region: Secondary | ICD-10-CM | POA: Diagnosis not present

## 2019-03-30 DIAGNOSIS — G8929 Other chronic pain: Secondary | ICD-10-CM | POA: Diagnosis not present

## 2019-03-30 DIAGNOSIS — M961 Postlaminectomy syndrome, not elsewhere classified: Secondary | ICD-10-CM

## 2019-03-30 DIAGNOSIS — Z451 Encounter for adjustment and management of infusion pump: Secondary | ICD-10-CM | POA: Insufficient documentation

## 2019-03-30 DIAGNOSIS — G893 Neoplasm related pain (acute) (chronic): Secondary | ICD-10-CM | POA: Insufficient documentation

## 2019-03-30 DIAGNOSIS — M5442 Lumbago with sciatica, left side: Secondary | ICD-10-CM | POA: Insufficient documentation

## 2019-03-30 DIAGNOSIS — G894 Chronic pain syndrome: Secondary | ICD-10-CM | POA: Diagnosis not present

## 2019-03-30 DIAGNOSIS — M79604 Pain in right leg: Secondary | ICD-10-CM | POA: Insufficient documentation

## 2019-03-30 DIAGNOSIS — M545 Low back pain, unspecified: Secondary | ICD-10-CM | POA: Insufficient documentation

## 2019-03-30 DIAGNOSIS — M79605 Pain in left leg: Secondary | ICD-10-CM | POA: Diagnosis not present

## 2019-03-30 DIAGNOSIS — M5441 Lumbago with sciatica, right side: Secondary | ICD-10-CM | POA: Diagnosis not present

## 2019-03-30 MED ORDER — PREDNISONE 20 MG PO TABS: ORAL_TABLET | ORAL | 0 refills | Status: AC

## 2019-03-30 NOTE — Progress Notes (Addendum)
PROVIDER NOTE: Information contained herein reflects review and annotations entered in association with encounter. Interpretation of such information and data should be left to medically-trained personnel. Information provided to patient can be located elsewhere in the medical record under "Patient Instructions". Document created using STT-dictation technology, any transcriptional errors that may result from process are unintentional.    Patient: Darrell White  Service Category: Procedure  Provider: Gaspar Cola, MD  DOB: [birth year]  DOS: 03/30/2019  Location: [facility]  MRN: [id number]  Setting: Ambulatory - outpatient  Referring Provider: [name], MD  Type: Established Patient  Specialty: Interventional Pain Management  PCP: Darrell Hilding, MD   Primary Reason for Visit: Interventional Pain Management Treatment. CC: Back Pain (lower)  Procedure:          Intrathecal Drug Delivery System (IDDS):  Type: Reservoir Refill [phone]) No rate change Region: Abdominal Laterality: Right  Type of Pump: Medtronic Synchromed II Delivery Route: Intrathecal Type of Pain Treated: Neuropathic/Nociceptive Primary Medication Class: Opioid/opiate  Medication, Concentration, Infusion Program, & Delivery Rate: Please see scanned programming printout.   Indications: 1. Chronic pain syndrome   2. Cancer-related pain   3. Chronic low back pain (Primary Area of Pain) (Bilateral) (R>L)   4. Chronic lower extremity pain (Secondary Area of Pain) (Bilateral) (R>L)   5. Failed back surgical syndrome   6. Presence of implanted infusion pump   7. Adjustment and management of infusion pump   8. DDD (degenerative disc disease), lumbar    Pain Assessment: Self-Reported Pain Score: 8 /10             Reported level is compatible with observation.        Pharmacotherapy Assessment  Analgesic: Oxycodone IR 10 mg, 1 tabl PO q 6 hrs (40 mg/day of oxycodone) MME/day: 60 mg/day.    Monitoring: [place] PMP: PDMP reviewed during this encounter.       Pharmacotherapy: No side-effects or adverse reactions reported. Compliance: No problems identified. Effectiveness: Clinically acceptable. Plan: Refer to "POC".  UDS:  Summary  Date Value Ref Range Status  03/26/2018 FINAL  Final    Comment:    ==================================================================== TOXASSURE SELECT 13 (MW) ==================================================================== Test                             Result       Flag       Units Drug Present and Declared for Prescription Verification   Oxycodone                      1772         EXPECTED   ng/mg creat   Oxymorphone                    >4115        EXPECTED   ng/mg creat   Noroxycodone                   >4115        EXPECTED   ng/mg creat   Noroxymorphone                 1803         EXPECTED   ng/mg creat    Sources of oxycodone are scheduled prescription medications.    Oxymorphone, noroxycodone, and noroxymorphone are expected    metabolites of oxycodone. Oxymorphone is also available as a  scheduled prescription medication. Drug Present not Declared for Prescription Verification   Morphine                       1085         UNEXPECTED ng/mg creat    Potential sources of large amounts of morphine in the absence of    codeine include administration of morphine or use of heroin. ==================================================================== Test                      Result    Flag   Units      Ref Range   Creatinine              243              mg/dL      >=20 ==================================================================== Declared Medications:  The flagging and interpretation on this report are based on the  following declared medications.  Unexpected results may arise from  inaccuracies in the declared medications.  **Note: The testing scope of this panel includes these medications:  Oxycodone  **Note: The  testing scope of this panel does not include following  reported medications:  Acetaminophen (Tylenol)  Atorvastatin (Lipitor)  Docusate  Gabapentin (Neurontin)  Levothyroxine  Mirtazapine  Omeprazole  Sotalol  Tiotropium  Trazodone ==================================================================== For clinical consultation, please call [phone]. ====================================================================    Intrathecal Pump Therapy Assessment  Manufacturer: Medtronic Synchromed Type: Programmable Volume: 40 mL reservoir MRI compatibility: Yes   Drug content:  Primary Medication Class: Opioid Primary Medication:PF-Morphine(20 mg/mL) Secondary Medication:PF-Bupivacaine(20 mg/mL) Other Medication: see pump readout   Programming:  Type: Simple continuous. See pump readout for details.   Changes:  Medication Change: None at this point Rate Change: No change in rate  Reported side-effects or adverse reactions: None reported  Effectiveness: Described as relatively effective, allowing for increase in activities of daily living (ADL) Clinically meaningful improvement in function (CMIF): Sustained CMIF goals met  Plan: Pump refill today  Pre-op Assessment:  Darrell White is a 69 y.o. (year old), male patient, seen today for interventional treatment. He  has a past surgical history that includes Mouth surgery; Cervical disc surgery; Shoulder arthroscopy; Back surgery; Spinal fusion; Spine surgery; Knee arthroscopy; Cataract extraction w/PHACO (Left, 04/09/2012); Direct laryngoscopy (N/A, 03/26/2016); Excision of tongue lesion (Right, 03/26/2016); OTHER SURGICAL HISTORY; and CT LUNG SCREENING (06/16/2017). Darrell White has a current medication list which includes the following prescription(s): acetaminophen, albuterol, amiodarone, atorvastatin, atorvastatin, diclofenac sodium, docusate calcium, eliquis, gabapentin, levothyroxine, levothyroxine, metoprolol succinate,  mirtazapine, omeprazole, pantoprazole, sotalol, tiotropium, trazodone, oxycodone hcl, [START ON 05/01/2019] oxycodone hcl, [START ON 05/31/2019] oxycodone hcl, and prednisone. His primarily concern today is the Back Pain (lower)  Initial Vital Signs:  Pulse/HCG Rate:    Temp: (!) 97.5 F (36.4 C) Resp:   BP: 129/75 SpO2:    BMI: Estimated body mass index is 26 kg/m as calculated from the following:   Height as of this encounter: '5\' 8"'$  (1.727 m).   Weight as of this encounter: 171 lb (77.6 kg).  Risk Assessment: Allergies: Reviewed. He is allergic to nsaids.  Allergy Precautions: None required Coagulopathies: Reviewed. None identified.  Blood-thinner therapy: None at this time Active Infection(s): Reviewed. None identified. Mr. Haug is afebrile  Site Confirmation: Mr. Wurtz was asked to confirm the procedure and laterality before marking the site Procedure checklist: Completed Consent: Before the procedure and under the influence of no sedative(s), amnesic(s), or anxiolytics, the patient  was informed of the treatment options, risks and possible complications. To fulfill our ethical and legal obligations, as recommended by the American Medical Association's Code of Ethics, I have informed the patient of my clinical impression; the nature and purpose of the treatment or procedure; the risks, benefits, and possible complications of the intervention; the alternatives, including doing nothing; the risk(s) and benefit(s) of the alternative treatment(s) or procedure(s); and the risk(s) and benefit(s) of doing nothing.  Mr. Regas was provided with information about the general risks and possible complications associated with most interventional procedures. These include, but are not limited to: failure to achieve desired goals, infection, bleeding, organ or nerve damage, allergic reactions, paralysis, and/or death.  In addition, he was informed of those risks and possible complications associated  to this particular procedure, which include, but are not limited to: damage to the implant; failure to decrease pain; local, systemic, or serious CNS infections, intraspinal abscess with possible cord compression and paralysis, or life-threatening such as meningitis; bleeding; organ damage; nerve injury or damage with subsequent sensory, motor, and/or autonomic system dysfunction, resulting in transient or permanent pain, numbness, and/or weakness of one or several areas of the body; allergic reactions, either minor or major life-threatening, such as anaphylactic or anaphylactoid reactions.  Furthermore, Mr. Essman was informed of those risks and complications associated with the medications. These include, but are not limited to: allergic reactions (i.e.: anaphylactic or anaphylactoid reactions); endorphine suppression; bradycardia and/or hypotension; water retention and/or peripheral vascular relaxation leading to lower extremity edema and possible stasis ulcers; respiratory depression and/or shortness of breath; decreased metabolic rate leading to weight gain; swelling or edema; medication-induced neural toxicity; particulate matter embolism and blood vessel occlusion with resultant organ, and/or nervous system infarction; and/or intrathecal granuloma formation with possible spinal cord compression and permanent paralysis.  Before refilling the pump Mr. Eisenhardt was informed that some of the medications used in the devise may not be FDA approved for such use and therefore it constitutes an off-label use of the medications.  Finally, he was informed that Medicine is not an exact science; therefore, there is also the possibility of unforeseen or unpredictable risks and/or possible complications that may result in a catastrophic outcome. The patient indicated having understood very clearly. We have given the patient no guarantees and we have made no promises. Enough time was given to the patient to ask questions,  all of which were answered to the patient's satisfaction. Mr. Strike has indicated that he wanted to continue with the procedure. Attestation: I, the ordering provider, attest that I have discussed with the patient the benefits, risks, side-effects, alternatives, likelihood of achieving goals, and potential problems during recovery for the procedure that I have provided informed consent. Date  Time: 03/30/2019 11:06 AM  Pre-Procedure Preparation:  Monitoring: As per clinic protocol. Respiration, ETCO2, SpO2, BP, heart rate and rhythm monitor placed and checked for adequate function Safety Precautions: Patient was assessed for positional comfort and pressure points before starting the procedure. Time-out: I initiated and conducted the "Time-out" before starting the procedure, as per protocol. The patient was asked to participate by confirming the accuracy of the "Time Out" information. Verification of the correct person, site, and procedure were performed and confirmed by me, the nursing staff, and the patient. "Time-out" conducted as per Joint Commission's Universal Protocol (UP.01.01.01). Time: 1110  Description of Procedure:          Position: Supine Target Area: Central-port of intrathecal pump. Approach: Anterior, 90 degree angle approach. Area  Prepped: Entire Area around the pump implant. Prepping solution: DuraPrep (Iodine Povacrylex [0.7% available iodine] and Isopropyl Alcohol, 74% w/w) Safety Precautions: Aspiration looking for blood return was conducted prior to all injections. At no point did we inject any substances, as a needle was being advanced. No attempts were made at seeking any paresthesias. Safe injection practices and needle disposal techniques used. Medications properly checked for expiration dates. SDV (single dose vial) medications used. Description of the Procedure: Protocol guidelines were followed. Two nurses trained to do implant refills were present during the entire  procedure. The refill medication was checked by both healthcare providers as well as the patient. The patient was included in the "Time-out" to verify the medication. The patient was placed in position. The pump was identified. The area was prepped in the usual manner. The sterile template was positioned over the pump, making sure the side-port location matched that of the pump. Both, the pump and the template were held for stability. The needle provided in the Medtronic Kit was then introduced thru the center of the template and into the central port. The pump content was aspirated and discarded volume documented. The new medication was slowly infused into the pump, thru the filter, making sure to avoid overpressure of the device. The needle was then removed and the area cleansed, making sure to leave some of the prepping solution back to take advantage of its long term bactericidal properties. The pump was interrogated and programmed to reflect the correct medication, volume, and dosage. The program was printed and taken to the physician for approval. Once checked and signed by the physician, a copy was provided to the patient and another scanned into the EMR. Vitals:   03/30/19 1105  BP: 129/75  Temp: (!) 97.5 F (36.4 C)  Weight: 171 lb (77.6 kg)  Height: '5\' 8"'$  (1.727 m)    Start Time: 1110 hrs. End Time: 1146 hrs. Materials & Medications: Medtronic Refill Kit Medication(s): Please see chart orders for details.  Imaging Guidance:          Type of Imaging Technique: None used Indication(s): N/A Exposure Time: No patient exposure Contrast: None used. Fluoroscopic Guidance: N/A Ultrasound Guidance: N/A Interpretation: N/A  Antibiotic Prophylaxis:   Anti-infectives (From admission, onward)   None     Indication(s): None identified  Post-operative Assessment:  Post-procedure Vital Signs:  Pulse/HCG Rate:    Temp: (!) 97.5 F (36.4 C) Resp:   BP: 129/75 SpO2:    EBL:  None  Complications: No immediate post-treatment complications observed by team, or reported by patient.  Note: The patient tolerated the entire procedure well. A repeat set of vitals were taken after the procedure and the patient was kept under observation following institutional policy, for this type of procedure. Post-procedural neurological assessment was performed, showing return to baseline, prior to discharge. The patient was provided with post-procedure discharge instructions, including a section on how to identify potential problems. Should any problems arise concerning this procedure, the patient was given instructions to immediately contact us, at any time, without hesitation. In any case, we plan to contact the patient by telephone for a follow-up status report regarding this interventional procedure.  Comments:  No additional relevant information.  Plan of Care  Orders:  Orders Placed This Encounter  Procedures  . PUMP REFILL    Maintain Protocol by having two(2) healthcare providers during procedure and programming.    Scheduling Instructions:     Please refill intrathecal pump today.  Order Specific Question:   Where will this procedure be performed?    Answer:   [facility]  . PUMP REFILL    Whenever possible schedule on a procedure today.    Standing Status:   Future    Standing Expiration Date:   08/27/2019    Scheduling Instructions:     Please schedule intrathecal pump refill based on pump programming. Avoid schedule intervals of more than 120 days (4 months).    Order Specific Question:   Where will this procedure be performed?    Answer:   [facility]  . Informed Consent Details: Physician/Practitioner Attestation; Transcribe to consent form and obtain patient signature    Provider Attestation: I, [place] Dossie Arbour, MD, (Pain Management Specialist), the physician/practitioner, attest that I have discussed with the patient the benefits, risks,  side effects, alternatives, likelihood of achieving goals and potential problems during recovery for the procedure that I have provided informed consent.    Scheduling Instructions:     Procedure: Intrathecal Pump Refill     Attending Physician: Beatriz Chancellor A. Dossie Arbour, MD     Indications: Chronic Pain Syndrome (G89.4)     Transcribe to consent form and obtain patient signature.   Chronic Opioid Analgesic:  Oxycodone IR 10 mg, 1 tabl PO q 6 hrs (40 mg/day of oxycodone) MME/day: 60 mg/day.   Medications ordered for procedure: Meds ordered this encounter  Medications  . predniSONE (DELTASONE) 20 MG tablet    Sig: Take 3 tablets (60 mg total) by mouth daily with breakfast for 3 days, THEN 2 tablets (40 mg total) daily with breakfast for 3 days, THEN 1 tablet (20 mg total) daily with breakfast for 3 days.    Dispense:  18 tablet    Refill:  0  . Oxycodone HCl 10 MG TABS    Sig: Take 1 tablet (10 mg total) by mouth every 6 (six) hours as needed. Must last 30 days    Dispense:  120 tablet    Refill:  0    Chronic Pain: STOP Act (Not applicable) Fill 1 day early if closed on refill date. Do not fill until: 04/01/2019. To last until: 05/01/2019. Avoid benzodiazepines within 8 hours of opioids  . Oxycodone HCl 10 MG TABS    Sig: Take 1 tablet (10 mg total) by mouth every 6 (six) hours as needed. Must last 30 days    Dispense:  120 tablet    Refill:  0    Chronic Pain: STOP Act (Not applicable) Fill 1 day early if closed on refill date. Do not fill until: 05/01/2019. To last until: 05/31/2019. Avoid benzodiazepines within 8 hours of opioids  . Oxycodone HCl 10 MG TABS    Sig: Take 1 tablet (10 mg total) by mouth every 6 (six) hours as needed. Must last 30 days    Dispense:  120 tablet    Refill:  0    Chronic Pain: STOP Act (Not applicable) Fill 1 day early if closed on refill date. Do not fill until: 05/31/2019. To last until: 06/30/2019. Avoid benzodiazepines within 8 hours of opioids   Medications  administered: Darrell White had no medications administered during this visit.  See the medical record for exact dosing, route, and time of administration.  Follow-up plan:   Return for Pump Refill (Max:60mo.       Interventional management options:  Considering: Diagnostic bilateral intra-articular hip joint injection Therapeutic/palliative intrathecal pump refill(s) Diagnostic bilateral lumbar facet block#1 Possible bilateral  lumbar facet RFA Diagnostic caudal epidural steroid injection plus diagnostic epidurogram Possible Racz procedure Diagnostic left cervical epidural steroid injection Diagnosticleft cervical facet block Possible left cervical facet RFA   Palliative PRN treatment(s): Diagnostic bilateral lumbar facet block#1under fluoroscopic guidance and IV sedation Therapeutic/palliative intrathecal pump refill(s)      Recent Visits Date Type Provider Dept  03/30/19 Procedure visit Milinda Pointer, MD [facility]  Showing recent visits within past 90 days and meeting all other requirements   Future Appointments Date Type Provider Dept  06/24/19 Appointment Milinda Pointer, MD [facility]  Showing future appointments within next 90 days and meeting all other requirements   Disposition: Discharge home  Discharge (Date  Time): 03/30/2019; 1202 hrs.   Primary Care Physician: Darrell Hilding, MD Location: [facility] Note by: Darrell Cola, MD Date: 03/30/2019; Time: 2:21 PM  Disclaimer:  Medicine is not an Chief Strategy Officer. The only guarantee in medicine is that nothing is guaranteed. It is important to note that the decision to proceed with this intervention was based on the information collected from the patient. The Data and conclusions were drawn from the patient's questionnaire, the interview, and the physical examination. Because the information was provided in large part by the patient,  it cannot be guaranteed that it has not been purposely or unconsciously manipulated. Every effort has been made to obtain as much relevant data as possible for this evaluation. It is important to note that the conclusions that lead to this procedure are derived in large part from the available data. Always take into account that the treatment will also be dependent on availability of resources and existing treatment guidelines, considered by other Pain Management Practitioners as being common knowledge and practice, at the time of the intervention. For Medico-Legal purposes, it is also important to point out that variation in procedural techniques and pharmacological choices are the acceptable norm. The indications, contraindications, technique, and results of the above procedure should only be interpreted and judged by a Board-Certified Interventional Pain Specialist with extensive familiarity and expertise in the same exact procedure and technique.

## 2019-03-31 ENCOUNTER — Telehealth: Payer: Self-pay | Admitting: Pain Medicine

## 2019-03-31 NOTE — Telephone Encounter (Signed)
He called back to see why someone hasn't called him. I told him the nurses were working on the phone calls.

## 2019-03-31 NOTE — Telephone Encounter (Signed)
This patient says that during his pump refill visit yesterday you did not refill his po medication- oxycodone. Please advise. Thank you.

## 2019-03-31 NOTE — Telephone Encounter (Signed)
Pt called and asked why his pain medication wasn't sent to the pharmacy yesterday after his appt?

## 2019-04-01 ENCOUNTER — Telehealth: Payer: Self-pay | Admitting: Pain Medicine

## 2019-04-01 MED ORDER — OXYCODONE HCL 10 MG PO TABS: 10.0000 mg | ORAL_TABLET | Freq: Four times a day (QID) | ORAL | 0 refills | Status: DC | PRN

## 2019-04-01 NOTE — Addendum Note (Signed)
Addended by: Milinda Pointer A on: 04/01/2019 02:21 PM   Modules accepted: Orders

## 2019-04-01 NOTE — Telephone Encounter (Signed)
Patient states pain meds were not called in the day he came for pump refill. Please check on this with Dr. Dossie Arbour and let patient know status.

## 2019-04-06 MED FILL — Medication: INTRATHECAL | Qty: 1 | Status: AC

## 2019-04-15 DIAGNOSIS — Z6824 Body mass index (BMI) 24.0-24.9, adult: Secondary | ICD-10-CM | POA: Diagnosis not present

## 2019-04-15 DIAGNOSIS — J3489 Other specified disorders of nose and nasal sinuses: Secondary | ICD-10-CM | POA: Diagnosis not present

## 2019-04-15 DIAGNOSIS — C029 Malignant neoplasm of tongue, unspecified: Secondary | ICD-10-CM | POA: Diagnosis not present

## 2019-04-20 ENCOUNTER — Other Ambulatory Visit (HOSPITAL_COMMUNITY): Payer: Self-pay | Admitting: Otolaryngology

## 2019-04-20 ENCOUNTER — Other Ambulatory Visit: Payer: Self-pay | Admitting: Otolaryngology

## 2019-04-20 DIAGNOSIS — F17210 Nicotine dependence, cigarettes, uncomplicated: Secondary | ICD-10-CM | POA: Diagnosis not present

## 2019-04-20 DIAGNOSIS — D3705 Neoplasm of uncertain behavior of pharynx: Secondary | ICD-10-CM | POA: Diagnosis not present

## 2019-04-20 DIAGNOSIS — R070 Pain in throat: Secondary | ICD-10-CM | POA: Diagnosis not present

## 2019-04-20 DIAGNOSIS — G8929 Other chronic pain: Secondary | ICD-10-CM

## 2019-05-03 ENCOUNTER — Ambulatory Visit (HOSPITAL_COMMUNITY)
Admission: RE | Admit: 2019-05-03 | Discharge: 2019-05-03 | Disposition: A | Discharge status: Home / Self Care | Payer: PPO | Source: Ambulatory Visit | Attending: Otolaryngology | Admitting: Otolaryngology

## 2019-05-03 ENCOUNTER — Other Ambulatory Visit: Payer: Self-pay | Admitting: Otolaryngology

## 2019-05-03 ENCOUNTER — Other Ambulatory Visit: Payer: Self-pay

## 2019-05-03 DIAGNOSIS — G8929 Other chronic pain: Secondary | ICD-10-CM | POA: Diagnosis not present

## 2019-05-03 DIAGNOSIS — C029 Malignant neoplasm of tongue, unspecified: Secondary | ICD-10-CM | POA: Diagnosis not present

## 2019-05-03 DIAGNOSIS — R070 Pain in throat: Secondary | ICD-10-CM | POA: Diagnosis not present

## 2019-05-03 LAB — POCT I-STAT CREATININE: Creatinine, Ser: 1 mg/dL (ref 0.61–1.24)

## 2019-05-03 MED ORDER — IOHEXOL 300 MG/ML  SOLN
75.0000 mL | Freq: Once | INTRAMUSCULAR | Status: AC | PRN
  Administered 2019-05-03: 75 mL via INTRAVENOUS

## 2019-05-04 ENCOUNTER — Other Ambulatory Visit: Payer: Self-pay

## 2019-05-04 ENCOUNTER — Encounter (HOSPITAL_BASED_OUTPATIENT_CLINIC_OR_DEPARTMENT_OTHER): Payer: Self-pay | Admitting: Otolaryngology

## 2019-05-04 NOTE — Progress Notes (Signed)
Reviewed chart with Dr. Lissa Hoard, patient okay to proceed with surgery once Cardiology clears and advises on when patient can stop eliquis prior to surgery. Called Heather at Dr. Deeann Saint office and she is aware of need for clearance.

## 2019-05-05 DIAGNOSIS — E7849 Other hyperlipidemia: Secondary | ICD-10-CM | POA: Diagnosis not present

## 2019-05-05 DIAGNOSIS — I10 Essential (primary) hypertension: Secondary | ICD-10-CM | POA: Diagnosis not present

## 2019-05-06 ENCOUNTER — Other Ambulatory Visit (HOSPITAL_COMMUNITY): Payer: PPO

## 2019-05-06 ENCOUNTER — Other Ambulatory Visit (HOSPITAL_COMMUNITY)
Admission: RE | Admit: 2019-05-06 | Discharge: 2019-05-06 | Disposition: A | Discharge status: Home / Self Care | Payer: PPO | Source: Ambulatory Visit | Attending: Otolaryngology | Admitting: Otolaryngology

## 2019-05-06 ENCOUNTER — Other Ambulatory Visit: Payer: Self-pay

## 2019-05-25 DIAGNOSIS — I2510 Atherosclerotic heart disease of native coronary artery without angina pectoris: Secondary | ICD-10-CM | POA: Diagnosis not present

## 2019-05-25 DIAGNOSIS — I10 Essential (primary) hypertension: Secondary | ICD-10-CM | POA: Diagnosis not present

## 2019-05-25 DIAGNOSIS — I4892 Unspecified atrial flutter: Secondary | ICD-10-CM | POA: Diagnosis not present

## 2019-05-25 DIAGNOSIS — E782 Mixed hyperlipidemia: Secondary | ICD-10-CM | POA: Diagnosis not present

## 2019-05-27 ENCOUNTER — Other Ambulatory Visit (HOSPITAL_COMMUNITY)
Admission: RE | Admit: 2019-05-27 | Discharge: 2019-05-27 | Disposition: A | Discharge status: Home / Self Care | Payer: PPO | Source: Ambulatory Visit | Attending: Otolaryngology | Admitting: Otolaryngology

## 2019-05-27 ENCOUNTER — Other Ambulatory Visit: Payer: Self-pay

## 2019-05-27 DIAGNOSIS — Z20822 Contact with and (suspected) exposure to covid-19: Secondary | ICD-10-CM | POA: Insufficient documentation

## 2019-05-27 DIAGNOSIS — Z01812 Encounter for preprocedural laboratory examination: Secondary | ICD-10-CM | POA: Diagnosis not present

## 2019-05-28 LAB — SARS CORONAVIRUS 2 (TAT 6-24 HRS): SARS Coronavirus 2: NEGATIVE

## 2019-05-30 NOTE — Anesthesia Preprocedure Evaluation (Addendum)
Anesthesia Evaluation  Patient identified by MRN, date of birth, ID band Patient awake    Reviewed: Allergy & Precautions, NPO status , Patient's Chart, lab work & pertinent test results  Airway Mallampati: I  TM Distance: >3 FB Neck ROM: Limited   Comment: Very limited neck extension since radiation to head/neck in 2019  Dental  (+) Edentulous Upper, Edentulous Lower   Pulmonary COPD,  COPD inhaler, Current Smoker,  Hx lung ca s/p RUL lobectomy 55 pack year history, still smoking 2 ppd  Stopped using spiriva on his own Uses albuterol occasionally throughout the week   Pulmonary exam normal breath sounds clear to auscultation       Cardiovascular hypertension, Pt. on home beta blockers and Pt. 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 S/p radiation to head/neck after that biopsy  Took oxycodone 10mg  this AM- has been on chronic narcotics since 1991  Reproductive/Obstetrics negative OB ROS                           Anesthesia Physical Anesthesia Plan  ASA: IV  Anesthesia Plan: General   Post-op Pain Management:    Induction: Intravenous  PONV Risk Score and Plan: 1 and Ondansetron, Dexamethasone and Treatment may vary due to age or medical condition  Airway Management  Planned: Oral ETT  Additional Equipment: None  Intra-op Plan:   Post-operative Plan: Extubation in OR  Informed Consent: I have reviewed the patients History and Physical, chart, labs and discussed the procedure including the risks, benefits and alternatives for the proposed anesthesia with the patient or authorized representative who has indicated his/her understanding and acceptance.     Dental advisory given  Plan Discussed with: CRNA  Anesthesia Plan Comments:        Anesthesia Quick Evaluation

## 2019-05-31 ENCOUNTER — Ambulatory Visit (HOSPITAL_BASED_OUTPATIENT_CLINIC_OR_DEPARTMENT_OTHER): Payer: PPO | Admitting: Anesthesiology

## 2019-05-31 ENCOUNTER — Ambulatory Visit (HOSPITAL_BASED_OUTPATIENT_CLINIC_OR_DEPARTMENT_OTHER)
Admission: RE | Admit: 2019-05-31 | Discharge: 2019-05-31 | Disposition: A | Discharge status: Home / Self Care | Payer: PPO | Attending: Otolaryngology | Admitting: Otolaryngology

## 2019-05-31 ENCOUNTER — Encounter (HOSPITAL_BASED_OUTPATIENT_CLINIC_OR_DEPARTMENT_OTHER): Payer: Self-pay | Admitting: Otolaryngology

## 2019-05-31 ENCOUNTER — Other Ambulatory Visit: Payer: Self-pay

## 2019-05-31 ENCOUNTER — Encounter (HOSPITAL_BASED_OUTPATIENT_CLINIC_OR_DEPARTMENT_OTHER)
Admission: RE | Disposition: A | Discharge status: Home / Self Care | Payer: Self-pay | Source: Home / Self Care | Attending: Otolaryngology

## 2019-05-31 DIAGNOSIS — I480 Paroxysmal atrial fibrillation: Secondary | ICD-10-CM | POA: Insufficient documentation

## 2019-05-31 DIAGNOSIS — D3705 Neoplasm of uncertain behavior of pharynx: Secondary | ICD-10-CM | POA: Diagnosis not present

## 2019-05-31 DIAGNOSIS — Z902 Acquired absence of lung [part of]: Secondary | ICD-10-CM | POA: Diagnosis not present

## 2019-05-31 DIAGNOSIS — F17210 Nicotine dependence, cigarettes, uncomplicated: Secondary | ICD-10-CM | POA: Insufficient documentation

## 2019-05-31 DIAGNOSIS — K149 Disease of tongue, unspecified: Secondary | ICD-10-CM | POA: Diagnosis not present

## 2019-05-31 DIAGNOSIS — R1310 Dysphagia, unspecified: Secondary | ICD-10-CM | POA: Diagnosis not present

## 2019-05-31 DIAGNOSIS — J029 Acute pharyngitis, unspecified: Secondary | ICD-10-CM | POA: Diagnosis not present

## 2019-05-31 DIAGNOSIS — I10 Essential (primary) hypertension: Secondary | ICD-10-CM | POA: Diagnosis not present

## 2019-05-31 DIAGNOSIS — Z923 Personal history of irradiation: Secondary | ICD-10-CM | POA: Diagnosis not present

## 2019-05-31 DIAGNOSIS — Z85810 Personal history of malignant neoplasm of tongue: Secondary | ICD-10-CM | POA: Diagnosis not present

## 2019-05-31 DIAGNOSIS — K219 Gastro-esophageal reflux disease without esophagitis: Secondary | ICD-10-CM | POA: Diagnosis not present

## 2019-05-31 DIAGNOSIS — Z85118 Personal history of other malignant neoplasm of bronchus and lung: Secondary | ICD-10-CM | POA: Insufficient documentation

## 2019-05-31 DIAGNOSIS — J449 Chronic obstructive pulmonary disease, unspecified: Secondary | ICD-10-CM | POA: Diagnosis not present

## 2019-05-31 DIAGNOSIS — Z79899 Other long term (current) drug therapy: Secondary | ICD-10-CM | POA: Diagnosis not present

## 2019-05-31 DIAGNOSIS — Z79891 Long term (current) use of opiate analgesic: Secondary | ICD-10-CM | POA: Insufficient documentation

## 2019-05-31 DIAGNOSIS — Z7901 Long term (current) use of anticoagulants: Secondary | ICD-10-CM | POA: Insufficient documentation

## 2019-05-31 DIAGNOSIS — K140 Glossitis: Secondary | ICD-10-CM | POA: Diagnosis not present

## 2019-05-31 HISTORY — PX: DIRECT LARYNGOSCOPY: SHX5326

## 2019-05-31 HISTORY — DX: Cardiac arrhythmia, unspecified: I49.9

## 2019-05-31 SURGERY — LARYNGOSCOPY, DIRECT
Anesthesia: General | Site: Throat

## 2019-05-31 MED ORDER — LIDOCAINE 2% (20 MG/ML) 5 ML SYRINGE
INTRAMUSCULAR | Status: DC | PRN
  Administered 2019-05-31: 60 mg via INTRAVENOUS

## 2019-05-31 MED ORDER — ONDANSETRON HCL 4 MG/2ML IJ SOLN
INTRAMUSCULAR | Status: DC | PRN
  Administered 2019-05-31: 4 mg via INTRAVENOUS

## 2019-05-31 MED ORDER — ACETAMINOPHEN 500 MG PO TABS
ORAL_TABLET | ORAL | Status: AC
  Filled 2019-05-31: qty 2

## 2019-05-31 MED ORDER — SUCCINYLCHOLINE CHLORIDE 20 MG/ML IJ SOLN
INTRAMUSCULAR | Status: DC | PRN
  Administered 2019-05-31: 80 mg via INTRAVENOUS

## 2019-05-31 MED ORDER — PHENYLEPHRINE HCL (PRESSORS) 10 MG/ML IV SOLN
INTRAVENOUS | Status: DC | PRN
  Administered 2019-05-31: 80 ug via INTRAVENOUS

## 2019-05-31 MED ORDER — MIDAZOLAM HCL 2 MG/2ML IJ SOLN
INTRAMUSCULAR | Status: AC
  Filled 2019-05-31: qty 2

## 2019-05-31 MED ORDER — DEXAMETHASONE SODIUM PHOSPHATE 4 MG/ML IJ SOLN
INTRAMUSCULAR | Status: DC | PRN
  Administered 2019-05-31: 10 mg via INTRAVENOUS

## 2019-05-31 MED ORDER — PROPOFOL 10 MG/ML IV BOLUS
INTRAVENOUS | Status: DC | PRN
  Administered 2019-05-31: 150 mg via INTRAVENOUS

## 2019-05-31 MED ORDER — LIDOCAINE 2% (20 MG/ML) 5 ML SYRINGE
INTRAMUSCULAR | Status: AC
  Filled 2019-05-31: qty 5

## 2019-05-31 MED ORDER — MIDAZOLAM HCL 2 MG/2ML IJ SOLN
1.0000 mg | INTRAMUSCULAR | Status: DC | PRN
  Administered 2019-05-31: 09:00:00 1 mg via INTRAVENOUS

## 2019-05-31 MED ORDER — OXYCODONE HCL 5 MG/5ML PO SOLN: 5.0000 mg | Freq: Once | ORAL | Status: DC | PRN

## 2019-05-31 MED ORDER — FENTANYL CITRATE (PF) 100 MCG/2ML IJ SOLN
INTRAMUSCULAR | Status: AC
  Filled 2019-05-31: qty 2

## 2019-05-31 MED ORDER — EPINEPHRINE PF 1 MG/ML IJ SOLN
INTRAMUSCULAR | Status: DC | PRN
  Administered 2019-05-31: 1 mg

## 2019-05-31 MED ORDER — FENTANYL CITRATE (PF) 100 MCG/2ML IJ SOLN
50.0000 ug | INTRAMUSCULAR | Status: DC | PRN
  Administered 2019-05-31: 09:00:00 50 ug via INTRAVENOUS

## 2019-05-31 MED ORDER — DEXAMETHASONE SODIUM PHOSPHATE 10 MG/ML IJ SOLN
INTRAMUSCULAR | Status: AC
  Filled 2019-05-31: qty 1

## 2019-05-31 MED ORDER — OXYCODONE HCL 5 MG PO TABS: 5.0000 mg | ORAL_TABLET | Freq: Once | ORAL | Status: DC | PRN

## 2019-05-31 MED ORDER — LACTATED RINGERS IV SOLN: INTRAVENOUS | Status: DC

## 2019-05-31 MED ORDER — ACETAMINOPHEN 500 MG PO TABS
1000.0000 mg | ORAL_TABLET | Freq: Once | ORAL | Status: AC
  Administered 2019-05-31: 08:00:00 1000 mg via ORAL

## 2019-05-31 MED ORDER — AMOXICILLIN 875 MG PO TABS: 875.0000 mg | ORAL_TABLET | Freq: Two times a day (BID) | ORAL | 0 refills | Status: AC

## 2019-05-31 MED ORDER — HYDROMORPHONE HCL 1 MG/ML IJ SOLN: 0.2500 mg | INTRAMUSCULAR | Status: DC | PRN

## 2019-05-31 MED ORDER — ONDANSETRON HCL 4 MG/2ML IJ SOLN
INTRAMUSCULAR | Status: AC
  Filled 2019-05-31: qty 2

## 2019-05-31 MED ORDER — ONDANSETRON HCL 4 MG/2ML IJ SOLN: 4.0000 mg | Freq: Once | INTRAMUSCULAR | Status: DC | PRN

## 2019-05-31 MED ORDER — SUCCINYLCHOLINE CHLORIDE 200 MG/10ML IV SOSY
PREFILLED_SYRINGE | INTRAVENOUS | Status: AC
  Filled 2019-05-31: qty 10

## 2019-05-31 MED ORDER — PHENYLEPHRINE 40 MCG/ML (10ML) SYRINGE FOR IV PUSH (FOR BLOOD PRESSURE SUPPORT)
PREFILLED_SYRINGE | INTRAVENOUS | Status: AC
  Filled 2019-05-31: qty 10

## 2019-05-31 SURGICAL SUPPLY — 25 items
ADAPTER TUBE FLEX ULTRASET (MISCELLANEOUS) IMPLANT
CANISTER SUCT 1200ML W/VALVE (MISCELLANEOUS) ×2 IMPLANT
CNTNR URN SCR LID CUP LEK RST (MISCELLANEOUS) ×1 IMPLANT
CONT SPEC 4OZ STRL OR WHT (MISCELLANEOUS) ×2
COVER WAND RF STERILE (DRAPES) IMPLANT
GAUZE SPONGE 4X4 12PLY STRL LF (GAUZE/BANDAGES/DRESSINGS) IMPLANT
GLOVE BIO SURGEON STRL SZ7.5 (GLOVE) ×2 IMPLANT
GOWN STRL REUS W/ TWL LRG LVL3 (GOWN DISPOSABLE) ×1 IMPLANT
GOWN STRL REUS W/TWL LRG LVL3 (GOWN DISPOSABLE) ×2
GUARD TEETH (MISCELLANEOUS) IMPLANT
MARKER SKIN DUAL TIP RULER LAB (MISCELLANEOUS) IMPLANT
NEEDLE HYPO 18GX1.5 BLUNT FILL (NEEDLE) ×2 IMPLANT
NEEDLE SPNL 22GX7 QUINCKE BK (NEEDLE) IMPLANT
NEEDLE SPNL 25GX3.5 QUINCKE BL (NEEDLE) IMPLANT
NS IRRIG 1000ML POUR BTL (IV SOLUTION) ×2 IMPLANT
PATTIES SURGICAL .5 X3 (DISPOSABLE) ×2 IMPLANT
SET BASIN DAY SURGERY F.S. (CUSTOM PROCEDURE TRAY) ×2 IMPLANT
SHEET MEDIUM DRAPE 40X70 STRL (DRAPES) ×2 IMPLANT
SLEEVE SCD COMPRESS KNEE MED (MISCELLANEOUS) ×2 IMPLANT
SOLUTION BUTLER CLEAR DIP (MISCELLANEOUS) IMPLANT
SURGILUBE 2OZ TUBE FLIPTOP (MISCELLANEOUS) IMPLANT
SYR CONTROL 10ML LL (SYRINGE) IMPLANT
SYR TB 1ML LL NO SAFETY (SYRINGE) ×2 IMPLANT
TOWEL GREEN STERILE FF (TOWEL DISPOSABLE) ×2 IMPLANT
TUBE CONNECTING 20X1/4 (TUBING) ×2 IMPLANT

## 2019-05-31 NOTE — H&P (Signed)
Cc:   The patient is a 69 year old male who presents today complaining of possible recurrent tongue cancer.  The patient was previously seen in 2018.  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  He was treated with radiation therapy.  The patient was lost to follow up. According to the patient, he has noted increasing soreness in his throat, similar to his initial presentation 3 years ago.  In addition, he has also noted several lesions in his left nasal cavity.  The patient has a long history of tobacco abuse.  He is currently smoking 2 packs per day of cigarettes.  He denies any breathing difficulty.  He reports slight dysphagia and odynophagia.  His recent CT scan showed mild asymmetric enhancement along the mucosal surface of the tongue base and vallecula just right of midline.  The patient's review of systems (constitutional, eyes, ENT, cardiovascular, respiratory, GI, musculoskeletal, skin, neurologic, psychiatric, endocrine, hematologic, allergic) is noted in the ROS questionnaire.  It is reviewed with the patient.  Family health history: None.  Major events: bilateral  shoulder surgery,right  knee arthroscopy, back surgery X7.  Ongoing medical problems: Thyroid disease, chest pain, arthritis.  Social history: The patient is married. He smokes two pack of cigarettes a day. He denies the use of alcohol or illegal drugs.   Objective General: Communicates without difficulty, well nourished, no acute distress. Head: Normocephalic, no evidence injury, no tenderness, facial buttresses intact without stepoff. Eyes: PERRL, EOMI.  No scleral icterus, conjunctivae clear. Ears: External auditory canals clear bilaterally.  There is no edema or erythema.  Tympanic membrane is within normal limits bilaterally. Nose: Normal skin and external support.  Anterior rhinoscopy reveals 3 wart-like lesions at the entrance to his left nostril.  No other nasal mass is  noted today.   Oral cavity: Lips without lesions. Normal oral mucosa. Indirect  mirror laryngoscopy could not be tolerated. Pharynx: Clear, no erythema. Neck: Supple, full range of motion, no lymphadenopathy, no masses palpable. Salivary: Parotid and submandibular glands without mass. Neuro:  CN 2-12 grossly intact. Gait normal. Vestibular: No nystagmus at any point of gaze.   Procedure:  Flexible Fiberoptic Laryngoscopy -- Risks, benefits, and alternatives of flexible endoscopy were explained to the patient.  Specific mention was made of the risk of throat numbness with difficulty swallowing, possible bleeding from the nose and mouth, and pain from the procedure.  The patient gave oral consent to proceed.  The nasal cavities were decongested and anesthetised with a combination of oxymetazoline and 4% lidocaine solution.  The flexible scope was inserted into the right nasal cavity and advanced towards the nasopharynx.  Visualized mucosa over the turbinates and septum were normal.  The nasopharynx was clear.   Hypopharynx was also without lesion or edema.  Larynx was mobile without lesions.  No lesions or asymmetry in the supraglottic larynx.  Arytenoid mucosa was edematous.  True vocal folds were pale yellow and without mass or lesion.  Base of tongue was normal.  The patient tolerated the procedure well.   Assessment 1.  The patient has a history of right tongue base squamous cell carcinoma.  He was previously treated with radiation therapy.  The patient now complains of recurrent soreness in his throat.  No obvious suspicious mass or lesion is noted on today's laryngoscopy exam.  His tongue base is soft to palpation.  2.  CT scan showed mild asymmetric enhancement along the mucosal surface of the  tongue base and vallecula just right of midline. 3.  Tobacco dependence.   Plan  1.  The physical exam and laryngoscopy findings are reviewed with the patient. Tobacco cessation is discussed and strongly  encouraged.  2.  Based on the CT results, will proceed with DL and biopsy of his right tongue base and vallecula.

## 2019-05-31 NOTE — Anesthesia Postprocedure Evaluation (Signed)
Anesthesia Post Note  Patient: Darrell White  Procedure(s) Performed: DIRECT LARYNGOSCOPY WITH BIOPSY (N/A Throat)     Patient location during evaluation: PACU Anesthesia Type: General Level of consciousness: awake and alert, oriented and patient cooperative Pain management: pain level controlled Vital Signs Assessment: post-procedure vital signs reviewed and stable Respiratory status: spontaneous breathing, nonlabored ventilation and respiratory function stable Cardiovascular status: blood pressure returned to baseline and stable Postop Assessment: no apparent nausea or vomiting Anesthetic complications: no    Last Vitals:  Vitals:   05/31/19 1000 05/31/19 1015  BP: (!) 150/74 126/70  Pulse: 70 67  Resp: 14 11  Temp:    SpO2: 100% 95%    Last Pain:  Vitals:   05/31/19 1015  TempSrc:   PainSc: 0-No pain                 Pervis Hocking

## 2019-05-31 NOTE — Anesthesia Procedure Notes (Signed)
Procedure Name: Intubation Date/Time: 05/31/2019 9:05 AM Performed by: Lieutenant Diego, CRNA Pre-anesthesia Checklist: Patient identified, Emergency Drugs available, Suction available and Patient being monitored Patient Re-evaluated:Patient Re-evaluated prior to induction Oxygen Delivery Method: Circle system utilized Preoxygenation: Pre-oxygenation with 100% oxygen Induction Type: IV induction Ventilation: Mask ventilation without difficulty Laryngoscope Size: Miller and 2 Grade View: Grade I Tube type: Oral Tube size: 7.0 mm Number of attempts: 1 Airway Equipment and Method: Stylet and Oral airway Placement Confirmation: ETT inserted through vocal cords under direct vision,  positive ETCO2 and breath sounds checked- equal and bilateral Secured at: 22 cm Tube secured with: Tape Dental Injury: Teeth and Oropharynx as per pre-operative assessment

## 2019-05-31 NOTE — Transfer of Care (Signed)
Immediate Anesthesia Transfer of Care Note  Patient: Darrell White  Procedure(s) Performed: DIRECT LARYNGOSCOPY WITH BIOPSY (N/A Throat)  Patient Location: PACU  Anesthesia Type:General  Level of Consciousness: awake  Airway & Oxygen Therapy: Patient Spontanous Breathing and Patient connected to face mask oxygen  Post-op Assessment: Report given to RN and Post -op Vital signs reviewed and stable  Post vital signs: Reviewed and stable  Last Vitals:  Vitals Value Taken Time  BP 163/76 05/31/19 0928  Temp    Pulse 82 05/31/19 0930  Resp 22 05/31/19 0930  SpO2 100 % 05/31/19 0930  Vitals shown include unvalidated device data.  Last Pain:  Vitals:   05/31/19 0752  TempSrc: Oral  PainSc: 0-No pain         Complications: No apparent anesthesia complications

## 2019-05-31 NOTE — Discharge Instructions (Addendum)
The patient may resume all his previous activities and diet.  He will follow-up in my office in 1 week.   Post Anesthesia Home Care Instructions  Activity: Get plenty of rest for the remainder of the day. A responsible individual must stay with you for 24 hours following the procedure.  For the next 24 hours, DO NOT: -Drive a car -Paediatric nurse -Drink alcoholic beverages -Take any medication unless instructed by your physician -Make any legal decisions or sign important papers.  Meals: Start with liquid foods such as gelatin or soup. Progress to regular foods as tolerated. Avoid greasy, spicy, heavy foods. If nausea and/or vomiting occur, drink only clear liquids until the nausea and/or vomiting subsides. Call your physician if vomiting continues.  Special Instructions/Symptoms: Your throat may feel dry or sore from the anesthesia or the breathing tube placed in your throat during surgery. If this causes discomfort, gargle with warm salt water. The discomfort should disappear within 24 hours.  Call your surgeon if you experience:   1.  Fever over 101.0. 2.  Inability to urinate. 3.  Nausea and/or vomiting. 4.  Extreme swelling or bruising at the surgical site. 5.  Continued bleeding from the incision. 6.  Increased pain, redness or drainage from the incision. 7.  Problems related to your pain medication. 8.  Any problems and/or concerns    *May take Tylenol at 2pm

## 2019-05-31 NOTE — Op Note (Signed)
DATE OF PROCEDURE:  05/31/2019                              OPERATIVE REPORT  SURGEON:  Leta Baptist, MD  PREOPERATIVE DIAGNOSES: 1. Possible recurrent tongue base cancer  POSTOPERATIVE DIAGNOSES: 1. Possible recurrent tongue base cancer  PROCEDURE PERFORMED:  Direct laryngoscopy and biopsy of the right base of tongue and vallecula  ANESTHESIA:  General endotracheal tube anesthesia.  COMPLICATIONS:  None.  ESTIMATED BLOOD LOSS:  Minimal.  INDICATION FOR PROCEDURE:  Darrell White is a 69 y.o. 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  He was treated with radiation therapy. According to the patient, he has recently noted increasing soreness in his throat, similar to his initial presentation 3 years ago.  The patient has a long history of tobacco abuse.  He is currently smoking 2 packs per day of cigarettes.  His recent CT scan showed mild asymmetric enhancement along the mucosal surface of the tongue base and vallecula just right of midline. Based on the above findings, the decision was made for the patient to undergo the above stated procedure. Likelihood of success in reducing symptoms was also discussed.  The risks, benefits, alternatives, and details of the procedure were discussed with the patient.  Questions were invited and answered.  Informed consent was obtained.  DESCRIPTION:  The patient was taken to the operating room and placed supine on the operating table.  General endotracheal tube anesthesia was administered by the anesthesiologist.  The patient was positioned and prepped and draped in a standard fashion for direct laryngoscopy.   A Dedo laryngoscope was inserted via the oral cavity into the pharynx.  Examination of the vallecula, epiglottis, aryepiglottic folds, piriform sinuses, and the glottic opening were all normal.  No obvious suspicious mass or lesion was noted.  Palpation of the tongue base showed no obvious palpable mass.  The Dedo  laryngoscope was subsequently suspended.  Three biopsy specimens were obtained from the right tongue base and vallecula.  The specimens were sent to the pathology department for permanent histologic identification.  Hemostasis was achieved with pledgets soaked with epinephrine.  The Dedo laryngoscope was withdrawn.  The care of the patient was turned over to the anesthesiologist.  The patient was awakened from anesthesia without difficulty.  The patient was extubated and transferred to the recovery room in good condition.  OPERATIVE FINDINGS: No suspicious mass or lesion was noted.  SPECIMEN: 3 biopsy specimens were obtained from the right tongue base and vallecula.  FOLLOWUP CARE:  The patient will be discharged home once awake and alert. The patient will follow up in my office in approximately 1 week.  [name] 05/31/2019 9:17 AM

## 2019-06-01 ENCOUNTER — Encounter: Payer: Self-pay | Admitting: *Deleted

## 2019-06-02 ENCOUNTER — Other Ambulatory Visit: Payer: Self-pay

## 2019-06-02 MED ORDER — PAIN MANAGEMENT IT PUMP REFILL: 1.0000 | Freq: Once | INTRATHECAL | 0 refills | Status: AC

## 2019-06-03 LAB — SURGICAL PATHOLOGY

## 2019-06-04 DIAGNOSIS — I10 Essential (primary) hypertension: Secondary | ICD-10-CM | POA: Diagnosis not present

## 2019-06-04 DIAGNOSIS — E7849 Other hyperlipidemia: Secondary | ICD-10-CM | POA: Diagnosis not present

## 2019-06-11 DIAGNOSIS — M25511 Pain in right shoulder: Secondary | ICD-10-CM | POA: Diagnosis not present

## 2019-06-11 DIAGNOSIS — Z6824 Body mass index (BMI) 24.0-24.9, adult: Secondary | ICD-10-CM | POA: Diagnosis not present

## 2019-06-14 DIAGNOSIS — D3705 Neoplasm of uncertain behavior of pharynx: Secondary | ICD-10-CM | POA: Diagnosis not present

## 2019-06-24 ENCOUNTER — Encounter: Payer: Self-pay | Admitting: Pain Medicine

## 2019-06-24 ENCOUNTER — Ambulatory Visit: Payer: PPO | Attending: Pain Medicine | Admitting: Pain Medicine

## 2019-06-24 ENCOUNTER — Other Ambulatory Visit: Payer: Self-pay

## 2019-06-24 VITALS — BP 118/66 | HR 71 | Temp 98.1°F | Resp 16 | Ht 68.0 in | Wt 161.0 lb

## 2019-06-24 DIAGNOSIS — R109 Unspecified abdominal pain: Secondary | ICD-10-CM | POA: Diagnosis present

## 2019-06-24 DIAGNOSIS — Z886 Allergy status to analgesic agent status: Secondary | ICD-10-CM | POA: Diagnosis not present

## 2019-06-24 DIAGNOSIS — Z87891 Personal history of nicotine dependence: Secondary | ICD-10-CM | POA: Insufficient documentation

## 2019-06-24 DIAGNOSIS — Z7901 Long term (current) use of anticoagulants: Secondary | ICD-10-CM | POA: Diagnosis not present

## 2019-06-24 DIAGNOSIS — Z462 Encounter for fitting and adjustment of other devices related to nervous system and special senses: Secondary | ICD-10-CM

## 2019-06-24 DIAGNOSIS — M961 Postlaminectomy syndrome, not elsewhere classified: Secondary | ICD-10-CM

## 2019-06-24 DIAGNOSIS — M51369 Other intervertebral disc degeneration, lumbar region without mention of lumbar back pain or lower extremity pain: Secondary | ICD-10-CM

## 2019-06-24 DIAGNOSIS — Z85118 Personal history of other malignant neoplasm of bronchus and lung: Secondary | ICD-10-CM | POA: Insufficient documentation

## 2019-06-24 DIAGNOSIS — Z85810 Personal history of malignant neoplasm of tongue: Secondary | ICD-10-CM | POA: Insufficient documentation

## 2019-06-24 DIAGNOSIS — M25512 Pain in left shoulder: Secondary | ICD-10-CM | POA: Insufficient documentation

## 2019-06-24 DIAGNOSIS — M19012 Primary osteoarthritis, left shoulder: Secondary | ICD-10-CM | POA: Diagnosis not present

## 2019-06-24 DIAGNOSIS — Z79890 Hormone replacement therapy (postmenopausal): Secondary | ICD-10-CM | POA: Diagnosis not present

## 2019-06-24 DIAGNOSIS — G894 Chronic pain syndrome: Secondary | ICD-10-CM

## 2019-06-24 DIAGNOSIS — Z451 Encounter for adjustment and management of infusion pump: Secondary | ICD-10-CM

## 2019-06-24 DIAGNOSIS — G893 Neoplasm related pain (acute) (chronic): Secondary | ICD-10-CM

## 2019-06-24 DIAGNOSIS — I2510 Atherosclerotic heart disease of native coronary artery without angina pectoris: Secondary | ICD-10-CM | POA: Diagnosis not present

## 2019-06-24 DIAGNOSIS — Z79899 Other long term (current) drug therapy: Secondary | ICD-10-CM | POA: Insufficient documentation

## 2019-06-24 DIAGNOSIS — G8929 Other chronic pain: Secondary | ICD-10-CM | POA: Insufficient documentation

## 2019-06-24 DIAGNOSIS — M5136 Other intervertebral disc degeneration, lumbar region: Secondary | ICD-10-CM | POA: Diagnosis not present

## 2019-06-24 DIAGNOSIS — M79604 Pain in right leg: Secondary | ICD-10-CM | POA: Diagnosis not present

## 2019-06-24 DIAGNOSIS — Z95828 Presence of other vascular implants and grafts: Secondary | ICD-10-CM

## 2019-06-24 DIAGNOSIS — M79605 Pain in left leg: Secondary | ICD-10-CM | POA: Insufficient documentation

## 2019-06-24 MED ORDER — OXYCODONE HCL 10 MG PO TABS: 10.0000 mg | ORAL_TABLET | Freq: Four times a day (QID) | ORAL | 0 refills | Status: DC | PRN

## 2019-06-24 MED FILL — Medication: INTRATHECAL | Qty: 1 | Status: AC

## 2019-06-24 NOTE — Progress Notes (Signed)
PROVIDER NOTE: Information contained herein reflects review and annotations entered in association with encounter. Interpretation of such information and data should be left to medically-trained personnel. Information provided to patient can be located elsewhere in the medical record under "Patient Instructions". Document created using STT-dictation technology, any transcriptional errors that may result from process are unintentional.    Patient: Darrell White  Service Category: Procedure  Provider: Gaspar Cola, MD  DOB: Jun 18, 1950  DOS: 06/24/2019  Location: [facility]  MRN: [id number]  Setting: Ambulatory - outpatient  Referring Provider: [name], MD  Type: Established Patient  Specialty: Interventional Pain Management  PCP: [name], MD   Primary Reason for Visit: Interventional Pain Management Treatment. CC: Abdominal Pain (right)  Procedure:          Intrathecal Drug Delivery System (IDDS):  Type: Reservoir Refill [phone]) No rate change Region: Abdominal Laterality: Right  Type of Pump: Medtronic Synchromed II Delivery Route: Intrathecal Type of Pain Treated: Neuropathic/Nociceptive Primary Medication Class: Opioid/opiate  Medication, Concentration, Infusion Program, & Delivery Rate: Please see scanned programming printout.   Indications: 1. Cancer-related pain   2. Chronic pain syndrome   3. Chronic low back pain (Primary Area of Pain) (Bilateral) (R>L)   4. Chronic lower extremity pain (Secondary Area of Pain) (Bilateral) (R>L)   5. DDD (degenerative disc disease), lumbar   6. Failed back surgical syndrome   7. Presence of implanted infusion pump   8. Adjustment and management of infusion pump    Chronic shoulder pain (Left)    Osteoarthritis of shoulder (Left)    Chronic anticoagulation (Eliquis)    End of battery life of intrathecal infusion pump    Pain Assessment: Self-Reported Pain Score: 8 /10             Reported level is  compatible with observation.        Note: This is the case of a 69 year old white male patient who comes into the clinic today for his scheduled intrathecal pump refill.  Upon taking a read out of the pump.  Indicates that it is coming to its end of battery life.  In view of this, we will go ahead and do a referral to Dr. Lysle Morales, [facility] neurosurgery, for replacement of the intrathecal pump.  We will use this note to provide Dr. Lacinda Axon with some information regarding the device.  The patient's chronic pain problems involves pain in the cervical as well as lumbar region.  He has a prior history of lumbar spine and cervical spine surgery.  His primary area of pain is that of the lower back followed by the lower extremities.  The patient indicated having had an intrathecal pump implanted around 1991. He has had the pump replaced a total of 3-4 times. He indicates that the last one was implanted approximately 5-6 years ago. He also indicates that he has had the pump serviced by the [facility] in [place]. He denies any side effects from the intrathecal pump. In addition to the pump, he has been using oxycodone IR 20 mg 4 times a day for the pain. He indicated that he uses this for the pain above the thoracic region. In the pump, the patient has PF-morphine 20 mg/mL + PF bupivacaine 20 mg/mL.  In addition, the patient also has a medical history significant for cardiovascular disease including atrial flutter with history of rapid ventricular response on Eliquis anticoagulation.  He also has a history of  coronary artery disease.  Unfortunately, the patient also has a history of tobacco use disorder which is likely to be responsible for the history of squamous cell carcinoma of the base of the tongue and lung cancer involving the right upper lobe.  In addition to this, the patient has indicated that he is having some pain in the area of the left shoulder and he would like to have it  injected.  However, he is on Eliquis and therefore we need to stop it for 3 days prior to the procedure.  We will go ahead and schedule him to come back next week for that injection.  Pharmacotherapy Assessment  Analgesic: Oxycodone IR 10 mg, 1 tabl PO q 6 hrs (40 mg/day of oxycodone) MME/day: 60 mg/day.   Monitoring: [place] PMP: PDMP reviewed during this encounter.       Pharmacotherapy: No side-effects or adverse reactions reported. Compliance: No problems identified. Effectiveness: Clinically acceptable. Plan: Refer to "POC".  UDS:  Summary  Date Value Ref Range Status  03/26/2018 FINAL  Final    Comment:    ==================================================================== TOXASSURE SELECT 13 (MW) ==================================================================== Test                             Result       Flag       Units Drug Present and Declared for Prescription Verification   Oxycodone                      1772         EXPECTED   ng/mg creat   Oxymorphone                    >4115        EXPECTED   ng/mg creat   Noroxycodone                   >4115        EXPECTED   ng/mg creat   Noroxymorphone                 1803         EXPECTED   ng/mg creat    Sources of oxycodone are scheduled prescription medications.    Oxymorphone, noroxycodone, and noroxymorphone are expected    metabolites of oxycodone. Oxymorphone is also available as a    scheduled prescription medication. Drug Present not Declared for Prescription Verification   Morphine                       1085         UNEXPECTED ng/mg creat    Potential sources of large amounts of morphine in the absence of    codeine include administration of morphine or use of heroin. ==================================================================== Test                      Result    Flag   Units      Ref Range   Creatinine              243              mg/dL       >=20 ==================================================================== Declared Medications:  The flagging and interpretation on this report are based on the  following declared medications.  Unexpected results may arise from  inaccuracies in the declared medications.  **Note:  The testing scope of this panel includes these medications:  Oxycodone  **Note: The testing scope of this panel does not include following  reported medications:  Acetaminophen (Tylenol)  Atorvastatin (Lipitor)  Docusate  Gabapentin (Neurontin)  Levothyroxine  Mirtazapine  Omeprazole  Sotalol  Tiotropium  Trazodone ==================================================================== For clinical consultation, please call [phone]. ====================================================================    Intrathecal Pump Therapy Assessment  Manufacturer: Medtronic Synchromed Type: Programmable Volume: 40 mL reservoir MRI compatibility: Yes   Drug content:  Primary Medication Class: Opioid Primary Medication:PF-Morphine(20 mg/mL) Secondary Medication:PF-Bupivacaine(20 mg/mL) Other Medication: No third medication   Programming:  Type: Simple continuous. See pump readout for details.   Changes:  Medication Change: None at this point Rate Change: No change in rate  Reported side-effects or adverse reactions: None reported  Effectiveness: Described as relatively effective, allowing for increase in activities of daily living (ADL) Clinically meaningful improvement in function (CMIF): Sustained CMIF goals met  Plan: Pump refill today  Pre-op Assessment:  Mr. Garbers is a 69 y.o. (year old), male patient, seen today for interventional treatment. He  has a past surgical history that includes Mouth surgery; Cervical disc surgery; Shoulder arthroscopy; Back surgery; Spinal fusion; Spine surgery; Knee arthroscopy; Cataract extraction w/PHACO (Left, 04/09/2012); Direct laryngoscopy (N/A, 03/26/2016);  Excision of tongue lesion (Right, 03/26/2016); OTHER SURGICAL HISTORY; CT LUNG SCREENING (06/16/2017); and Direct laryngoscopy (N/A, 05/31/2019). Mr. Paolucci has a current medication list which includes the following prescription(s): albuterol, amiodarone, atorvastatin, diclofenac sodium, docusate calcium, eliquis, gabapentin, levothyroxine, levothyroxine, metoprolol succinate, mirtazapine, omeprazole, [START ON 06/30/2019] oxycodone hcl, [START ON 07/30/2019] oxycodone hcl, [START ON 08/29/2019] oxycodone hcl, sotalol, tiotropium, and trazodone. His primarily concern today is the Abdominal Pain (right)  Initial Vital Signs:  Pulse/HCG Rate: 71  Temp: 98.1 F (36.7 C) Resp: 16 BP: 118/66 SpO2: 99 %  BMI: Estimated body mass index is 24.48 kg/m as calculated from the following:   Height as of this encounter: '5\' 8"'$  (1.727 m).   Weight as of this encounter: 161 lb (73 kg).  Risk Assessment: Allergies: Reviewed. He is allergic to nsaids.  Allergy Precautions: None required Coagulopathies: Reviewed. None identified.  Blood-thinner therapy: None at this time Active Infection(s): Reviewed. None identified. Mr. Haskew is afebrile  Site Confirmation: Mr. Boschert was asked to confirm the procedure and laterality before marking the site Procedure checklist: Completed Consent: Before the procedure and under the influence of no sedative(s), amnesic(s), or anxiolytics, the patient was informed of the treatment options, risks and possible complications. To fulfill our ethical and legal obligations, as recommended by the American Medical Association's Code of Ethics, I have informed the patient of my clinical impression; the nature and purpose of the treatment or procedure; the risks, benefits, and possible complications of the intervention; the alternatives, including doing nothing; the risk(s) and benefit(s) of the alternative treatment(s) or procedure(s); and the risk(s) and benefit(s) of doing nothing.  Mr.  Sheffler was provided with information about the general risks and possible complications associated with most interventional procedures. These include, but are not limited to: failure to achieve desired goals, infection, bleeding, organ or nerve damage, allergic reactions, paralysis, and/or death.  In addition, he was informed of those risks and possible complications associated to this particular procedure, which include, but are not limited to: damage to the implant; failure to decrease pain; local, systemic, or serious CNS infections, intraspinal abscess with possible cord compression and paralysis, or life-threatening such as meningitis; bleeding; organ damage; nerve injury or damage with subsequent sensory, motor,  and/or autonomic system dysfunction, resulting in transient or permanent pain, numbness, and/or weakness of one or several areas of the body; allergic reactions, either minor or major life-threatening, such as anaphylactic or anaphylactoid reactions.  Furthermore, Mr. Heckmann was informed of those risks and complications associated with the medications. These include, but are not limited to: allergic reactions (i.e.: anaphylactic or anaphylactoid reactions); endorphine suppression; bradycardia and/or hypotension; water retention and/or peripheral vascular relaxation leading to lower extremity edema and possible stasis ulcers; respiratory depression and/or shortness of breath; decreased metabolic rate leading to weight gain; swelling or edema; medication-induced neural toxicity; particulate matter embolism and blood vessel occlusion with resultant organ, and/or nervous system infarction; and/or intrathecal granuloma formation with possible spinal cord compression and permanent paralysis.  Before refilling the pump Mr. Shorten was informed that some of the medications used in the devise may not be FDA approved for such use and therefore it constitutes an off-label use of the medications.  Finally, he  was informed that Medicine is not an exact science; therefore, there is also the possibility of unforeseen or unpredictable risks and/or possible complications that may result in a catastrophic outcome. The patient indicated having understood very clearly. We have given the patient no guarantees and we have made no promises. Enough time was given to the patient to ask questions, all of which were answered to the patient's satisfaction. Mr. Lamoureaux has indicated that he wanted to continue with the procedure. Attestation: I, the ordering provider, attest that I have discussed with the patient the benefits, risks, side-effects, alternatives, likelihood of achieving goals, and potential problems during recovery for the procedure that I have provided informed consent. Date  Time: 06/24/2019 10:59 AM  Pre-Procedure Preparation:  Monitoring: As per clinic protocol. Respiration, ETCO2, SpO2, BP, heart rate and rhythm monitor placed and checked for adequate function Safety Precautions: Patient was assessed for positional comfort and pressure points before starting the procedure. Time-out: I initiated and conducted the "Time-out" before starting the procedure, as per protocol. The patient was asked to participate by confirming the accuracy of the "Time Out" information. Verification of the correct person, site, and procedure were performed and confirmed by me, the nursing staff, and the patient. "Time-out" conducted as per Joint Commission's Universal Protocol (UP.01.01.01). Time: 1100  Description of Procedure:          Position: Supine Target Area: Central-port of intrathecal pump. Approach: Anterior, 90 degree angle approach. Area Prepped: Entire Area around the pump implant. DuraPrep (Iodine Povacrylex [0.7% available iodine] and Isopropyl Alcohol, 74% w/w) Safety Precautions: Aspiration looking for blood return was conducted prior to all injections. At no point did we inject any substances, as a needle was  being advanced. No attempts were made at seeking any paresthesias. Safe injection practices and needle disposal techniques used. Medications properly checked for expiration dates. SDV (single dose vial) medications used. Description of the Procedure: Protocol guidelines were followed. Two nurses trained to do implant refills were present during the entire procedure. The refill medication was checked by both healthcare providers as well as the patient. The patient was included in the "Time-out" to verify the medication. The patient was placed in position. The pump was identified. The area was prepped in the usual manner. The sterile template was positioned over the pump, making sure the side-port location matched that of the pump. Both, the pump and the template were held for stability. The needle provided in the Medtronic Kit was then introduced thru the center of the template  and into the central port. The pump content was aspirated and discarded volume documented. The new medication was slowly infused into the pump, thru the filter, making sure to avoid overpressure of the device. The needle was then removed and the area cleansed, making sure to leave some of the prepping solution back to take advantage of its long term bactericidal properties. The pump was interrogated and programmed to reflect the correct medication, volume, and dosage. The program was printed and taken to the physician for approval. Once checked and signed by the physician, a copy was provided to the patient and another scanned into the EMR. Vitals:   06/24/19 1059  BP: 118/66  Pulse: 71  Resp: 16  Temp: 98.1 F (36.7 C)  TempSrc: Temporal  SpO2: 99%  Weight: 161 lb (73 kg)  Height: '5\' 8"'$  (1.727 m)    Start Time: 1110 hrs. End Time: 1119 hrs. Materials & Medications: Medtronic Refill Kit Medication(s): Please see chart orders for details.  Imaging Guidance:          Type of Imaging Technique: None used Indication(s):  N/A Exposure Time: No patient exposure Contrast: None used. Fluoroscopic Guidance: N/A Ultrasound Guidance: N/A Interpretation: N/A  Antibiotic Prophylaxis:   Anti-infectives (From admission, onward)   None     Indication(s): None identified  Post-operative Assessment:  Post-procedure Vital Signs:  Pulse/HCG Rate: 71  Temp: 98.1 F (36.7 C) Resp: 16 BP: 118/66 SpO2: 99 %  EBL: None  Complications: No immediate post-treatment complications observed by team, or reported by patient.  Note: The patient tolerated the entire procedure well. A repeat set of vitals were taken after the procedure and the patient was kept under observation following institutional policy, for this type of procedure. Post-procedural neurological assessment was performed, showing return to baseline, prior to discharge. The patient was provided with post-procedure discharge instructions, including a section on how to identify potential problems. Should any problems arise concerning this procedure, the patient was given instructions to immediately contact us, at any time, without hesitation. In any case, we plan to contact the patient by telephone for a follow-up status report regarding this interventional procedure.  Comments:  No additional relevant information.  Plan of Care  Orders:  Orders Placed This Encounter  Procedures  . PUMP REFILL    Maintain Protocol by having two(2) healthcare providers during procedure and programming.    Scheduling Instructions:     Please refill intrathecal pump today.    Order Specific Question:   Where will this procedure be performed?    Answer:   [facility]  . PUMP REFILL    Whenever possible schedule on a procedure today.    Standing Status:   Future    Standing Expiration Date:   11/21/2019    Scheduling Instructions:     Please schedule intrathecal pump refill based on pump programming. Avoid schedule intervals of more than 120 days (4 months).     Order Specific Question:   Where will this procedure be performed?    Answer:   [facility]  . SHOULDER INJECTION    Standing Status:   Future    Standing Expiration Date:   07/24/2019    Scheduling Instructions:     Side: Left-sided     Sedation: No Sedation.     Timeframe: As soon as schedule allows    Order Specific Question:   Where will this procedure be performed?    Answer:   [facility]  Comments:   by Dr. Dossie Arbour  . Ambulatory referral to Neurosurgery    Referral Priority:   Routine    Referral Type:   Surgical    Referral Reason:   Specialty Services Required    Referred to Provider:   Deetta Perla, MD    Requested Specialty:   Neurosurgery    Number of Visits Requested:   1  . Informed Consent Details: Physician/Practitioner Attestation; Transcribe to consent form and obtain patient signature    Provider Attestation: I, [place] Dossie Arbour, MD, (Pain Management Specialist), the physician/practitioner, attest that I have discussed with the patient the benefits, risks, side effects, alternatives, likelihood of achieving goals and potential problems during recovery for the procedure that I have provided informed consent.    Scheduling Instructions:     Procedure: Intrathecal Pump Refill     Attending Physician: Beatriz Chancellor A. Dossie Arbour, MD     Indications: Chronic Pain Syndrome (G89.4)     Transcribe to consent form and obtain patient signature.  . Blood Thinner Instructions to Nursing    If unable to stop, ask if Lovenox-bridge therapy may be possible, and if so, request their assistance in implementing it.    Scheduling Instructions:     Contact the physician prescribing the blood thinner and request clearance to stop it for time period stipulated below.     If approved by prescribing physician, stop Eliquis (Apixaban) x 3 days prior to procedure or surgery.  . Blood Thinner Instructions to Nursing    If the patient requires a Lovenox-bridge therapy, make  sure arrangements are made to institute it with the assistance of the PCP.    Standing Status:   Standing    Number of Occurrences:   36    Standing Expiration Date:   12/24/2020    Scheduling Instructions:     Always stop the Eliquis (Apixaban) x 3 days prior to procedure or surgery.   Chronic Opioid Analgesic:  Oxycodone IR 10 mg, 1 tabl PO q 6 hrs (40 mg/day of oxycodone) MME/day: 60 mg/day.   Medications ordered for procedure: Meds ordered this encounter  Medications  . Oxycodone HCl 10 MG TABS    Sig: Take 1 tablet (10 mg total) by mouth every 6 (six) hours as needed. Must last 30 days    Dispense:  120 tablet    Refill:  0    Chronic Pain: STOP Act (Not applicable) Fill 1 day early if closed on refill date. Do not fill until: 06/30/2019. To last until: 07/30/2019. Avoid benzodiazepines within 8 hours of opioids  . Oxycodone HCl 10 MG TABS    Sig: Take 1 tablet (10 mg total) by mouth every 6 (six) hours as needed. Must last 30 days    Dispense:  120 tablet    Refill:  0    Chronic Pain: STOP Act (Not applicable) Fill 1 day early if closed on refill date. Do not fill until: 07/30/2019. To last until: 08/29/2019. Avoid benzodiazepines within 8 hours of opioids  . Oxycodone HCl 10 MG TABS    Sig: Take 1 tablet (10 mg total) by mouth every 6 (six) hours as needed. Must last 30 days    Dispense:  120 tablet    Refill:  0    Chronic Pain: STOP Act (Not applicable) Fill 1 day early if closed on refill date. Do not fill until: 08/29/2019. To last until: 09/28/2019. Avoid benzodiazepines within 8 hours of opioids   Medications administered: Darrell White  had no medications administered during this visit.  See the medical record for exact dosing, route, and time of administration.  Follow-up plan:   Return in about 2 weeks (around 07/08/2019) for Pump Refill (Max:69mo, Procedure (no sedation): (L) Shoulder inj. #1, (Blood-thinner Protocol).   [facility] for [facility]cal pump replacement.     Interventional management options:  Considering: Diagnostic bilateral intra-articular hip joint injection Therapeutic/palliative intrathecal pump refill(s) Diagnostic bilateral lumbar facet block#1 Possible bilateral lumbar facet RFA Diagnostic caudal epidural steroid injection plus diagnostic epidurogram Possible Racz procedure Diagnostic left cervical epidural steroid injection Diagnosticleft cervical facet block Possible left cervical facet RFA   Palliative PRN treatment(s): Diagnostic bilateral lumbar facet block#1under fluoroscopic guidance and IV sedation Therapeutic/palliative intrathecal pump refill(s)    Recent Visits Date Type Provider Dept  03/30/19 Procedure visit NMilinda Pointer MD [facility]  Showing recent visits within past 90 days and meeting all other requirements   Today's Visits Date Type Provider Dept  06/24/19 Procedure visit NMilinda Pointer MD [facility]  Showing today's visits and meeting all other requirements   Future Appointments Date Type Provider Dept  07/06/19 Appointment NMilinda Pointer MD [facility]  Showing future appointments within next 90 days and meeting all other requirements   Disposition: Discharge home  Discharge (Date  Time): 06/24/2019; 1150 hrs.   Primary Care Physician: [facility]Manon [facility]ilding [facility] Note by: FGaspar Cola MD Date: 06/24/2019; Time: 12:12 PM  Disclaimer:  Medicine is not an eChief Strategy Officer The only guarantee in medicine is that nothing is guaranteed. It is important to note that the decision to proceed with this intervention was based on the information collected from the patient. The Data and conclusions were drawn from the patient's questionnaire, the interview, and the physical examination. Because the information was provided in large part by the  patient, it cannot be guaranteed that it has not been purposely or unconsciously manipulated. Every effort has been made to obtain as much relevant data as possible for this evaluation. It is important to note that the conclusions that lead to this procedure are derived in large part from the available data. Always take into account that the treatment will also be dependent on availability of resources and existing treatment guidelines, considered by other Pain Management Practitioners as being common knowledge and practice, at the time of the intervention. For Medico-Legal purposes, it is also important to point out that variation in procedural techniques and pharmacological choices are the acceptable norm. The indications, contraindications, technique, and results of the above procedure should only be interpreted and judged by a Board-Certified Interventional Pain Specialist with extensive familiarity and expertise in the same exact procedure and technique.

## 2019-06-24 NOTE — Progress Notes (Signed)
Nursing Pain Medication Assessment:  Safety precautions to be maintained throughout the outpatient stay will include: orient to surroundings, keep bed in low position, maintain call bell within reach at all times, provide assistance with transfer out of bed and ambulation.  Medication Inspection Compliance: Pill count conducted under aseptic conditions, in front of the patient. Neither the pills nor the bottle was removed from the patient's sight at any time. Once count was completed pills were immediately returned to the patient in their original bottle.  Medication: Oxycodone IR Pill/Patch Count: 37 of 120 pills remain Pill/Patch Appearance: Markings consistent with prescribed medication Bottle Appearance: Standard pharmacy container. Clearly labeled. Filled Date:04/27/ 2021 Last Medication intake:  Today

## 2019-06-24 NOTE — Patient Instructions (Addendum)
____________________________________________________________________________________________  Preparing for your procedure (without sedation)  Procedure appointments are limited to planned procedures: . No Prescription Refills. . No disability issues will be discussed. . No medication changes will be discussed.  Instructions: . Oral Intake: Do not eat or drink anything for at least 6 hours prior to your procedure. (Exception: Blood Pressure Medication. See below.) . Transportation: Unless otherwise stated by your physician, you may drive yourself after the procedure. . Blood Pressure Medicine: Do not forget to take your blood pressure medicine with a sip of water the morning of the procedure. If your Diastolic (lower reading)is above 100 mmHg, elective cases will be cancelled/rescheduled. . Blood thinners: These will need to be stopped for procedures. Notify our staff if you are taking any blood thinners. Depending on which one you take, there will be specific instructions on how and when to stop it. . Diabetics on insulin: Notify the staff so that you can be scheduled 1st case in the morning. If your diabetes requires high dose insulin, take only  of your normal insulin dose the morning of the procedure and notify the staff that you have done so. . Preventing infections: Shower with an antibacterial soap the morning of your procedure.  . Build-up your immune system: Take 1000 mg of Vitamin C with every meal (3 times a day) the day prior to your procedure. Marland Kitchen Antibiotics: Inform the staff if you have a condition or reason that requires you to take antibiotics before dental procedures. . Pregnancy: If you are pregnant, call and cancel the procedure. . Sickness: If you have a cold, fever, or any active infections, call and cancel the procedure. . Arrival: You must be in the facility at least 30 minutes prior to your scheduled procedure. . Children: Do not bring any children with you. . Dress  appropriately: Bring dark clothing that you would not mind if they get stained. . Valuables: Do not bring any jewelry or valuables.  Reasons to call and reschedule or cancel your procedure: (Following these recommendations will minimize the risk of a serious complication.) . Surgeries: Avoid having procedures within 2 weeks of any surgery. (Avoid for 2 weeks before or after any surgery). . Flu Shots: Avoid having procedures within 2 weeks of a flu shots or . (Avoid for 2 weeks before or after immunizations). . Barium: Avoid having a procedure within 7-10 days after having had a radiological study involving the use of radiological contrast. (Myelograms, Barium swallow or enema study). . Heart attacks: Avoid any elective procedures or surgeries for the initial 6 months after a "Myocardial Infarction" (Heart Attack). . Blood thinners: It is imperative that you stop these medications before procedures. Let us know if you if you take any blood thinner.  . Infection: Avoid procedures during or within two weeks of an infection (including chest colds or gastrointestinal problems). Symptoms associated with infections include: Localized redness, fever, chills, night sweats or profuse sweating, burning sensation when voiding, cough, congestion, stuffiness, runny nose, sore throat, diarrhea, nausea, vomiting, cold or Flu symptoms, recent or current infections. It is specially important if the infection is over the area that we intend to treat. Marland Kitchen Heart and lung problems: Symptoms that may suggest an active cardiopulmonary problem include: cough, chest pain, breathing difficulties or shortness of breath, dizziness, ankle swelling, uncontrolled high or unusually low blood pressure, and/or palpitations. If you are experiencing any of these symptoms, cancel your procedure and contact your primary care physician for an evaluation.  Remember:  Regular  Business hours are:  Monday to Thursday 8:00 AM to 4:00  PM  Provider's Schedule: Milinda Pointer, MD:  Procedure days: Tuesday and Thursday 7:30 AM to 4:00 PM  Gillis Santa, MD:  Procedure days: Monday and Wednesday 7:30 AM to 4:00 PM ____________________________________________________________________________________________ Stop taking Eliquis 3 days before your shoulder injection.

## 2019-06-25 ENCOUNTER — Telehealth: Payer: Self-pay

## 2019-06-25 NOTE — Telephone Encounter (Signed)
Post pump refill phone call. Patients father answered the phone and states the patient is doing fine but that he is constipated and is in the bathroom.  Informed him to have the patient call us for any questions or concerns.

## 2019-07-06 ENCOUNTER — Ambulatory Visit: Payer: PPO | Admitting: Pain Medicine

## 2019-07-06 NOTE — Progress Notes (Deleted)
PROVIDER NOTE: Information contained herein reflects review and annotations entered in association with encounter. Interpretation of such information and data should be left to medically-trained personnel. Information provided to patient can be located elsewhere in the medical record under "Patient Instructions". Document created using STT-dictation technology, any transcriptional errors that may result from process are unintentional.    Patient: Darrell White  Service Category: Procedure  Provider: Gaspar Cola, MD  DOB: 1950-08-09  DOS: 07/06/2019  Location: [facility]  MRN: [id number]  Setting: Ambulatory - outpatient  Referring Provider: [name], MD  Type: Established Patient  Specialty: Interventional Pain Management  PCP: [name], MD   Primary Reason for Visit: Interventional Pain Management Treatment. CC: No chief complaint on file.  Procedure:          Anesthesia, Analgesia, Anxiolysis:  Type: Diagnostic Glenohumeral Joint (shoulder) Injection          Primary Purpose: Diagnostic Region: Superior Shoulder Area Level:  Shoulder Target Area: Glenohumeral Joint (shoulder) Approach: Anterior approach. Laterality: Left-Sided  Type: Local Anesthesia Indication(s): Analgesia         Route: Infiltration (Burns/IM) IV Access: Declined Sedation: Declined  Local Anesthetic: Lidocaine 1-2%  Position: Supine   Indications: 1. Chronic shoulder pain (Left)   2. Osteoarthritis of shoulder (Left)    Chronic anticoagulation (Eliquis)    Pain Score: Pre-procedure:  /10 Post-procedure:  /10   Pre-op Assessment:  Darrell White is a 69 y.o. (year old), male patient, seen today for interventional treatment. He  has a past surgical history that includes Mouth surgery; Cervical disc surgery; Shoulder arthroscopy; Back surgery; Spinal fusion; Spine surgery; Knee arthroscopy; Cataract extraction w/PHACO (Left, 04/09/2012); Direct laryngoscopy (N/A, 03/26/2016); Excision of  tongue lesion (Right, 03/26/2016); OTHER SURGICAL HISTORY; CT LUNG SCREENING (06/16/2017); and Direct laryngoscopy (N/A, 05/31/2019). [name] has a current medication list which includes the following prescription(s): albuterol, amiodarone, atorvastatin, diclofenac sodium, docusate calcium, eliquis, gabapentin, levothyroxine, levothyroxine, metoprolol succinate, mirtazapine, omeprazole, oxycodone hcl, [START ON 07/30/2019] oxycodone hcl, [START ON 08/29/2019] oxycodone hcl, sotalol, tiotropium, and trazodone. His primarily concern today is the No chief complaint on file.  Initial Vital Signs:  Pulse/HCG Rate:    Temp:   Resp:   BP:   SpO2:    BMI: Estimated body mass index is 24.48 kg/m as calculated from the following:   Height as of 06/24/19: 5\' 8"  (1.727 m).   Weight as of 06/24/19: 161 lb (73 kg).  Risk Assessment: Allergies: Reviewed. He is allergic to nsaids.  Allergy Precautions: None required Coagulopathies: Reviewed. None identified.  Blood-thinner therapy: None at this time Active Infection(s): Reviewed. None identified. Darrell White is afebrile  Site Confirmation: Darrell White was asked to confirm the procedure and laterality before marking the site Procedure checklist: Completed Consent: Before the procedure and under the influence of no sedative(s), amnesic(s), or anxiolytics, the patient was informed of the treatment options, risks and possible complications. To fulfill our ethical and legal obligations, as recommended by the American Medical Association's Code of Ethics, I have informed the patient of my clinical impression; the nature and purpose of the treatment or procedure; the risks, benefits, and possible complications of the intervention; the alternatives, including doing nothing; the risk(s) and benefit(s) of the alternative treatment(s) or procedure(s); and the risk(s) and benefit(s) of doing nothing. The patient was provided information about the general risks and possible  complications associated with the procedure. These may include, but are not limited to: failure to  achieve desired goals, infection, bleeding, organ or nerve damage, allergic reactions, paralysis, and death. In addition, the patient was informed of those risks and complications associated to the procedure, such as failure to decrease pain; infection; bleeding; organ or nerve damage with subsequent damage to sensory, motor, and/or autonomic systems, resulting in permanent pain, numbness, and/or weakness of one or several areas of the body; allergic reactions; (i.e.: anaphylactic reaction); and/or death. Furthermore, the patient was informed of those risks and complications associated with the medications. These include, but are not limited to: allergic reactions (i.e.: anaphylactic or anaphylactoid reaction(s)); adrenal axis suppression; blood sugar elevation that in diabetics may result in ketoacidosis or comma; water retention that in patients with history of congestive heart failure may result in shortness of breath, pulmonary edema, and decompensation with resultant heart failure; weight gain; swelling or edema; medication-induced neural toxicity; particulate matter embolism and blood vessel occlusion with resultant organ, and/or nervous system infarction; and/or aseptic necrosis of one or more joints. Finally, the patient was informed that Medicine is not an exact science; therefore, there is also the possibility of unforeseen or unpredictable risks and/or possible complications that may result in a catastrophic outcome. The patient indicated having understood very clearly. We have given the patient no guarantees and we have made no promises. Enough time was given to the patient to ask questions, all of which were answered to the patient's satisfaction. Darrell White has indicated that he wanted to continue with the procedure. Attestation: I, the ordering provider, attest that I have discussed with the patient  the benefits, risks, side-effects, alternatives, likelihood of achieving goals, and potential problems during recovery for the procedure that I have provided informed consent. Date   Time: {CHL [facility]-PAIN TIME CHOICES:[id number]}  Pre-Procedure Preparation:  Monitoring: As per clinic protocol. Respiration, ETCO2, SpO2, BP, heart rate and rhythm monitor placed and checked for adequate function Safety Precautions: Patient was assessed for positional comfort and pressure points before starting the procedure. Time-out: I initiated and conducted the "Time-out" before starting the procedure, as per protocol. The patient was asked to participate by confirming the accuracy of the "Time Out" information. Verification of the correct person, site, and procedure were performed and confirmed by me, the nursing staff, and the patient. "Time-out" conducted as per Joint Commission's Universal Protocol (UP.01.01.01). Time:    Description of Procedure:          Area Prepped: Entire shoulder Area DuraPrep (Iodine Povacrylex [0.7% available iodine] and Isopropyl Alcohol, 74% w/w) Safety Precautions: Aspiration looking for blood return was conducted prior to all injections. At no point did we inject any substances, as a needle was being advanced. No attempts were made at seeking any paresthesias. Safe injection practices and needle disposal techniques used. Medications properly checked for expiration dates. SDV (single dose vial) medications used. Description of the Procedure: Protocol guidelines were followed. The patient was placed in position over the procedure table. The target area was identified and the area prepped in the usual manner. Skin & deeper tissues infiltrated with local anesthetic. Appropriate amount of time allowed to pass for local anesthetics to take effect. The procedure needles were then advanced to the target area. Proper needle placement secured. Negative aspiration confirmed. Solution injected in  intermittent fashion, asking for systemic symptoms every 0.5cc of injectate. The needles were then removed and the area cleansed, making sure to leave some of the prepping solution back to take advantage of its long term bactericidal properties.  There were no vitals filed for this visit.  Start Time:   hrs. End Time:   hrs. Materials:  Needle(s) Type: Spinal Needle Gauge: 22G Length: 3.5-in Medication(s): Please see orders for medications and dosing details.  Imaging Guidance (Non-Spinal):          Type of Imaging Technique: Fluoroscopy Guidance (Non-Spinal) Indication(s): Assistance in needle guidance and placement for procedures requiring needle placement in or near specific anatomical locations not easily accessible without such assistance. Exposure Time: Please see nurses notes. Contrast: None used. Fluoroscopic Guidance: I was personally present during the use of fluoroscopy. "Tunnel Vision Technique" used to obtain the best possible view of the target area. Parallax error corrected before commencing the procedure. "Direction-depth-direction" technique used to introduce the needle under continuous pulsed fluoroscopy. Once target was reached, antero-posterior, oblique, and lateral fluoroscopic projection used confirm needle placement in all planes. Images permanently stored in EMR. Interpretation: No contrast injected. I personally interpreted the imaging intraoperatively. Adequate needle placement confirmed in multiple planes. Permanent images saved into the patient's record.  Antibiotic Prophylaxis:   Anti-infectives (From admission, onward)   None     Indication(s): None identified  Post-operative Assessment:  Post-procedure Vital Signs:  Pulse/HCG Rate:    Temp:   Resp:   BP:   SpO2:    EBL: None  Complications: No immediate post-treatment complications observed by team, or reported by patient.  Note: The patient tolerated the entire procedure well. A repeat  set of vitals were taken after the procedure and the patient was kept under observation following institutional policy, for this type of procedure. Post-procedural neurological assessment was performed, showing return to baseline, prior to discharge. The patient was provided with post-procedure discharge instructions, including a section on how to identify potential problems. Should any problems arise concerning this procedure, the patient was given instructions to immediately contact us, at any time, without hesitation. In any case, we plan to contact the patient by telephone for a follow-up status report regarding this interventional procedure.  Comments:  No additional relevant information.  Plan of Care  Orders:  No orders of the defined types were placed in this encounter.  Chronic Opioid Analgesic:  Oxycodone IR 10 mg, 1 tabl PO q 6 hrs (40 mg/day of oxycodone) MME/day: 60 mg/day.   Medications ordered for procedure: No orders of the defined types were placed in this encounter.  Medications administered: Darrell White had no medications administered during this visit.  See the medical record for exact dosing, route, and time of administration.  Follow-up plan:   No follow-ups on file.       Interventional management options:  Considering: Diagnostic bilateral intra-articular hip joint injection Therapeutic/palliative intrathecal pump refill(s) Diagnostic bilateral lumbar facet block#1 Possible bilateral lumbar facet RFA Diagnostic caudal epidural steroid injection plus diagnostic epidurogram Possible Racz procedure Diagnostic left cervical epidural steroid injection Diagnosticleft cervical facet block Possible left cervical facet RFA   Palliative PRN treatment(s): Diagnostic bilateral lumbar facet block#1under fluoroscopic guidance and IV sedation Therapeutic/palliative intrathecal pump refill(s)     Recent Visits Date Type Provider Dept  06/24/19  Procedure visit Milinda Pointer, MD [facility]  Showing recent visits within past 90 days and meeting all other requirements   Today's Visits Date Type Provider Dept  07/06/19 Appointment Milinda Pointer, MD [facility]  Showing today's visits and meeting all other requirements   Future Appointments Date Type Provider Dept  09/28/19 Appointment Milinda Pointer, MD [facility]  Showing future  appointments within next 90 days and meeting all other requirements   Disposition: Discharge home  Discharge (Date   Time): 07/06/2019;   hrs.   Primary Care Physician: [name], MD Location: [facility] Note by: Gaspar Cola, MD Date: 07/06/2019; Time: 6:48 AM  Disclaimer:  Medicine is not an Chief Strategy Officer. The only guarantee in medicine is that nothing is guaranteed. It is important to note that the decision to proceed with this intervention was based on the information collected from the patient. The Data and conclusions were drawn from the patient's questionnaire, the interview, and the physical examination. Because the information was provided in large part by the patient, it cannot be guaranteed that it has not been purposely or unconsciously manipulated. Every effort has been made to obtain as much relevant data as possible for this evaluation. It is important to note that the conclusions that lead to this procedure are derived in large part from the available data. Always take into account that the treatment will also be dependent on availability of resources and existing treatment guidelines, considered by other Pain Management Practitioners as being common knowledge and practice, at the time of the intervention. For Medico-Legal purposes, it is also important to point out that variation in procedural techniques and pharmacological choices are the acceptable norm. The indications, contraindications, technique, and results  of the above procedure should only be interpreted and judged by a Board-Certified Interventional Pain Specialist with extensive familiarity and expertise in the same exact procedure and technique.

## 2019-07-20 DIAGNOSIS — G894 Chronic pain syndrome: Secondary | ICD-10-CM | POA: Diagnosis not present

## 2019-07-20 DIAGNOSIS — M5136 Other intervertebral disc degeneration, lumbar region: Secondary | ICD-10-CM | POA: Diagnosis not present

## 2019-07-20 DIAGNOSIS — M4326 Fusion of spine, lumbar region: Secondary | ICD-10-CM | POA: Diagnosis not present

## 2019-07-20 DIAGNOSIS — M546 Pain in thoracic spine: Secondary | ICD-10-CM | POA: Diagnosis not present

## 2019-07-27 ENCOUNTER — Other Ambulatory Visit: Payer: Self-pay | Admitting: Neurosurgery

## 2019-07-27 ENCOUNTER — Other Ambulatory Visit: Payer: Self-pay | Admitting: Pain Medicine

## 2019-07-27 NOTE — Progress Notes (Signed)
  Note: Broken intrathecal catheter, since before 2018.

## 2019-07-29 ENCOUNTER — Other Ambulatory Visit: Payer: Self-pay

## 2019-07-29 ENCOUNTER — Other Ambulatory Visit
Admission: RE | Admit: 2019-07-29 | Discharge: 2019-07-29 | Disposition: A | Discharge status: Home / Self Care | Payer: PPO | Source: Ambulatory Visit | Attending: Neurosurgery | Admitting: Neurosurgery

## 2019-07-29 ENCOUNTER — Encounter
Admission: RE | Admit: 2019-07-29 | Discharge: 2019-07-29 | Disposition: A | Discharge status: Home / Self Care | Payer: PPO | Source: Ambulatory Visit | Attending: Neurosurgery | Admitting: Neurosurgery

## 2019-07-29 DIAGNOSIS — Z20822 Contact with and (suspected) exposure to covid-19: Secondary | ICD-10-CM | POA: Insufficient documentation

## 2019-07-29 DIAGNOSIS — Z01812 Encounter for preprocedural laboratory examination: Secondary | ICD-10-CM | POA: Insufficient documentation

## 2019-07-29 LAB — SURGICAL PCR SCREEN
MRSA, PCR: NEGATIVE
Staphylococcus aureus: NEGATIVE

## 2019-07-29 LAB — CBC
HCT: 36.5 % — ABNORMAL LOW (ref 39.0–52.0)
Hemoglobin: 12.3 g/dL — ABNORMAL LOW (ref 13.0–17.0)
MCH: 31.1 pg (ref 26.0–34.0)
MCHC: 33.7 g/dL (ref 30.0–36.0)
MCV: 92.2 fL (ref 80.0–100.0)
Platelets: 200 10*3/uL (ref 150–400)
RBC: 3.96 MIL/uL — ABNORMAL LOW (ref 4.22–5.81)
RDW: 13.2 % (ref 11.5–15.5)
WBC: 5.9 10*3/uL (ref 4.0–10.5)
nRBC: 0 % (ref 0.0–0.2)

## 2019-07-29 LAB — SARS CORONAVIRUS 2 (TAT 6-24 HRS): SARS Coronavirus 2: NEGATIVE

## 2019-07-29 NOTE — Patient Instructions (Addendum)
Your procedure is scheduled on: 08/02/19 Report to [facility]. To find out your arrival time please call [phone] between 1PM - 3PM on 07/29/29.  Remember: Instructions that are not followed completely may result in serious medical risk, up to and including death, or upon the discretion of your surgeon and anesthesiologist your surgery may need to be rescheduled.     _X__ 1. Do not eat food after midnight the night before your procedure.                 No gum chewing or hard candies. You may drink clear liquids up to 2 hours                 before you are scheduled to arrive for your surgery- DO not drink clear                 liquids within 2 hours of the start of your surgery.                 Clear Liquids include:  water, apple juice without pulp, clear carbohydrate                 drink such as Clearfast or Gatorade, Black Coffee or Tea (Do not add                 anything to coffee or tea). Diabetics water only  __X__2.  On the morning of surgery brush your teeth with toothpaste and water, you                 may rinse your mouth with mouthwash if you wish.  Do not swallow any              toothpaste of mouthwash.     _X__ 3.  No Alcohol for 24 hours before or after surgery.   _X__ 4.  Do Not Smoke or use e-cigarettes For 24 Hours Prior to Your Surgery.                 Do not use any chewable tobacco products for at least 6 hours prior to                 surgery.  ____  5.  Bring all medications with you on the day of surgery if instructed.   __X__  6.  Notify your doctor if there is any change in your medical condition      (cold, fever, infections).     Do not wear jewelry, make-up, hairpins, clips or nail polish. Do not wear lotions, powders, or perfumes.  Do not shave 48 hours prior to surgery. Men may shave face and neck. Do not bring valuables to the hospital.    [facility] is not responsible for any belongings or  valuables.  Contacts, dentures/partials or body piercings may not be worn into surgery. Bring a case for your contacts, glasses or hearing aids, a denture cup will be supplied. Leave your suitcase in the car. After surgery it may be brought to your room. For patients admitted to the hospital, discharge time is determined by your treatment team.   Patients discharged the day of surgery will not be allowed to drive home.   Please read over the following fact sheets that you were given:   MRSA Information  __X__ Take these medicines the morning of surgery with A SIP OF WATER:  1. amiodarone (PACERONE) 200 MG tablet  2. atorvastatin (LIPITOR) 40 MG tablet  3. levothyroxine (SYNTHROID) 125 MCG tablet  4. metoprolol succinate (TOPROL-XL) 50 MG 24 hr tablet  5. gabapentin (NEURONTIN) 300 MG capsule IF NEEDED  6. Oxycodone HCl 10 MG TABS IF NEEDED  ____ Fleet Enema (as directed)   __X__ Use CHG Soap/SAGE wipes as directed  __X__ Use inhalers on the day of surgery  ____ Stop metformin/Janumet/Farxiga 2 days prior to surgery    ____ Take 1/2 of usual insulin dose the night before surgery. No insulin the morning          of surgery.   ____ Stop Blood Thinners Coumadin/Plavix/Xarelto/Pleta/Pradaxa/Eliquis/Effient/Aspirin  on   Or contact your Surgeon, Cardiologist or Medical Doctor regarding  ability to stop your blood thinners  __X__ Stop Anti-inflammatories 7 days before surgery such as Advil, Ibuprofen, Motrin,  BC or Goodies Powder, Naprosyn, Naproxen, Aleve, Aspirin    __X__ Stop all herbal supplements, fish oil or vitamin E until after surgery.    ____ Bring C-Pap to the hospital.   STOP ELIQUIS AS PREVIOUSLY INSTRUCTED

## 2019-08-02 ENCOUNTER — Ambulatory Visit: Payer: PPO | Admitting: Anesthesiology

## 2019-08-02 ENCOUNTER — Ambulatory Visit
Admission: RE | Admit: 2019-08-02 | Discharge: 2019-08-02 | Disposition: A | Discharge status: Home / Self Care | Payer: PPO | Attending: Neurosurgery | Admitting: Neurosurgery

## 2019-08-02 ENCOUNTER — Encounter
Admission: RE | Disposition: A | Discharge status: Home / Self Care | Payer: Self-pay | Source: Home / Self Care | Attending: Neurosurgery

## 2019-08-02 ENCOUNTER — Encounter: Payer: Self-pay | Admitting: Neurosurgery

## 2019-08-02 DIAGNOSIS — Z886 Allergy status to analgesic agent status: Secondary | ICD-10-CM | POA: Diagnosis not present

## 2019-08-02 DIAGNOSIS — F17210 Nicotine dependence, cigarettes, uncomplicated: Secondary | ICD-10-CM | POA: Insufficient documentation

## 2019-08-02 DIAGNOSIS — Z85118 Personal history of other malignant neoplasm of bronchus and lung: Secondary | ICD-10-CM | POA: Insufficient documentation

## 2019-08-02 DIAGNOSIS — F419 Anxiety disorder, unspecified: Secondary | ICD-10-CM | POA: Insufficient documentation

## 2019-08-02 DIAGNOSIS — M1990 Unspecified osteoarthritis, unspecified site: Secondary | ICD-10-CM | POA: Diagnosis not present

## 2019-08-02 DIAGNOSIS — E782 Mixed hyperlipidemia: Secondary | ICD-10-CM | POA: Diagnosis not present

## 2019-08-02 DIAGNOSIS — Z7901 Long term (current) use of anticoagulants: Secondary | ICD-10-CM | POA: Diagnosis not present

## 2019-08-02 DIAGNOSIS — Z8042 Family history of malignant neoplasm of prostate: Secondary | ICD-10-CM | POA: Diagnosis not present

## 2019-08-02 DIAGNOSIS — Z902 Acquired absence of lung [part of]: Secondary | ICD-10-CM | POA: Diagnosis not present

## 2019-08-02 DIAGNOSIS — T85890A Other specified complication of nervous system prosthetic devices, implants and grafts, initial encounter: Secondary | ICD-10-CM | POA: Insufficient documentation

## 2019-08-02 DIAGNOSIS — E7800 Pure hypercholesterolemia, unspecified: Secondary | ICD-10-CM | POA: Diagnosis not present

## 2019-08-02 DIAGNOSIS — X58XXXA Exposure to other specified factors, initial encounter: Secondary | ICD-10-CM | POA: Insufficient documentation

## 2019-08-02 DIAGNOSIS — J449 Chronic obstructive pulmonary disease, unspecified: Secondary | ICD-10-CM | POA: Diagnosis not present

## 2019-08-02 DIAGNOSIS — Z791 Long term (current) use of non-steroidal anti-inflammatories (NSAID): Secondary | ICD-10-CM | POA: Insufficient documentation

## 2019-08-02 DIAGNOSIS — K219 Gastro-esophageal reflux disease without esophagitis: Secondary | ICD-10-CM | POA: Diagnosis not present

## 2019-08-02 DIAGNOSIS — Z451 Encounter for adjustment and management of infusion pump: Secondary | ICD-10-CM | POA: Diagnosis not present

## 2019-08-02 DIAGNOSIS — G894 Chronic pain syndrome: Secondary | ICD-10-CM | POA: Diagnosis not present

## 2019-08-02 DIAGNOSIS — Z79899 Other long term (current) drug therapy: Secondary | ICD-10-CM | POA: Insufficient documentation

## 2019-08-02 DIAGNOSIS — I10 Essential (primary) hypertension: Secondary | ICD-10-CM | POA: Insufficient documentation

## 2019-08-02 DIAGNOSIS — Z961 Presence of intraocular lens: Secondary | ICD-10-CM | POA: Insufficient documentation

## 2019-08-02 DIAGNOSIS — F17200 Nicotine dependence, unspecified, uncomplicated: Secondary | ICD-10-CM | POA: Insufficient documentation

## 2019-08-02 DIAGNOSIS — Z85810 Personal history of malignant neoplasm of tongue: Secondary | ICD-10-CM | POA: Diagnosis not present

## 2019-08-02 DIAGNOSIS — E785 Hyperlipidemia, unspecified: Secondary | ICD-10-CM | POA: Insufficient documentation

## 2019-08-02 DIAGNOSIS — E039 Hypothyroidism, unspecified: Secondary | ICD-10-CM | POA: Diagnosis not present

## 2019-08-02 DIAGNOSIS — M545 Low back pain: Secondary | ICD-10-CM | POA: Diagnosis not present

## 2019-08-02 DIAGNOSIS — F329 Major depressive disorder, single episode, unspecified: Secondary | ICD-10-CM | POA: Diagnosis not present

## 2019-08-02 DIAGNOSIS — G8929 Other chronic pain: Secondary | ICD-10-CM | POA: Diagnosis not present

## 2019-08-02 DIAGNOSIS — Z9842 Cataract extraction status, left eye: Secondary | ICD-10-CM | POA: Insufficient documentation

## 2019-08-02 DIAGNOSIS — Z981 Arthrodesis status: Secondary | ICD-10-CM | POA: Insufficient documentation

## 2019-08-02 DIAGNOSIS — Z809 Family history of malignant neoplasm, unspecified: Secondary | ICD-10-CM | POA: Insufficient documentation

## 2019-08-02 HISTORY — PX: INTRATHECAL PUMP IMPLANT: SHX6809

## 2019-08-02 LAB — BASIC METABOLIC PANEL
Anion gap: 7 (ref 5–15)
BUN: 14 mg/dL (ref 8–23)
CO2: 28 mmol/L (ref 22–32)
Calcium: 8.8 mg/dL — ABNORMAL LOW (ref 8.9–10.3)
Chloride: 103 mmol/L (ref 98–111)
Creatinine, Ser: 0.9 mg/dL (ref 0.61–1.24)
GFR calc Af Amer: >60 mL/min (ref >60)
GFR calc non Af Amer: >60 mL/min (ref >60)
Glucose, Bld: 108 mg/dL — ABNORMAL HIGH (ref 70–99)
Potassium: 4.2 mmol/L (ref 3.5–5.1)
Sodium: 138 mmol/L (ref 135–145)

## 2019-08-02 LAB — CBC
HCT: 36.8 % — ABNORMAL LOW (ref 39.0–52.0)
Hemoglobin: 12.3 g/dL — ABNORMAL LOW (ref 13.0–17.0)
MCH: 30.7 pg (ref 26.0–34.0)
MCHC: 33.4 g/dL (ref 30.0–36.0)
MCV: 91.8 fL (ref 80.0–100.0)
Platelets: 187 10*3/uL (ref 150–400)
RBC: 4.01 MIL/uL — ABNORMAL LOW (ref 4.22–5.81)
RDW: 13.4 % (ref 11.5–15.5)
WBC: 5 10*3/uL (ref 4.0–10.5)
nRBC: 0 % (ref 0.0–0.2)

## 2019-08-02 LAB — ABO/RH: ABO/RH(D): O POS

## 2019-08-02 LAB — TYPE AND SCREEN
ABO/RH(D): O POS
Antibody Screen: NEGATIVE

## 2019-08-02 LAB — PROTIME-INR
INR: 1.1 (ref 0.8–1.2)
Prothrombin Time: 13.8 seconds (ref 11.4–15.2)

## 2019-08-02 LAB — APTT: aPTT: 39 seconds — ABNORMAL HIGH (ref 24–36)

## 2019-08-02 SURGERY — INTRATHECAL PUMP IMPLANT
Anesthesia: General | Laterality: Right

## 2019-08-02 MED ORDER — LACTATED RINGERS IV SOLN: INTRAVENOUS | Status: DC

## 2019-08-02 MED ORDER — CEFAZOLIN SODIUM-DEXTROSE 2-4 GM/100ML-% IV SOLN
2.0000 g | Freq: Once | INTRAVENOUS | Status: AC
  Administered 2019-08-02: 2 g via INTRAVENOUS

## 2019-08-02 MED ORDER — LIDOCAINE HCL (CARDIAC) PF 100 MG/5ML IV SOSY
PREFILLED_SYRINGE | INTRAVENOUS | Status: DC | PRN
  Administered 2019-08-02: 60 mg via INTRAVENOUS

## 2019-08-02 MED ORDER — VANCOMYCIN HCL 1000 MG IV SOLR
INTRAVENOUS | Status: DC | PRN
  Administered 2019-08-02: 1000 mg via TOPICAL

## 2019-08-02 MED ORDER — CHLORHEXIDINE GLUCONATE 0.12 % MT SOLN
OROMUCOSAL | Status: AC
  Administered 2019-08-02: 15 mL via OROMUCOSAL
  Filled 2019-08-02: qty 15

## 2019-08-02 MED ORDER — FENTANYL CITRATE (PF) 100 MCG/2ML IJ SOLN: 25.0000 ug | INTRAMUSCULAR | Status: DC | PRN

## 2019-08-02 MED ORDER — EPHEDRINE SULFATE 50 MG/ML IJ SOLN
INTRAMUSCULAR | Status: DC | PRN
  Administered 2019-08-02: 5 mg via INTRAVENOUS

## 2019-08-02 MED ORDER — FAMOTIDINE 20 MG PO TABS
20.0000 mg | ORAL_TABLET | Freq: Once | ORAL | Status: AC
  Administered 2019-08-02: 20 mg via ORAL

## 2019-08-02 MED ORDER — ONDANSETRON HCL 4 MG/2ML IJ SOLN: 4.0000 mg | Freq: Once | INTRAMUSCULAR | Status: DC | PRN

## 2019-08-02 MED ORDER — ORAL CARE MOUTH RINSE: 15.0000 mL | Freq: Once | OROMUCOSAL | Status: AC

## 2019-08-02 MED ORDER — CEFAZOLIN SODIUM-DEXTROSE 2-4 GM/100ML-% IV SOLN
INTRAVENOUS | Status: AC
  Filled 2019-08-02: qty 100

## 2019-08-02 MED ORDER — FAMOTIDINE 20 MG PO TABS
ORAL_TABLET | ORAL | Status: AC
  Filled 2019-08-02: qty 1

## 2019-08-02 MED ORDER — CHLORHEXIDINE GLUCONATE 0.12 % MT SOLN: 15.0000 mL | Freq: Once | OROMUCOSAL | Status: AC

## 2019-08-02 MED ORDER — DEXMEDETOMIDINE HCL 200 MCG/2ML IV SOLN
INTRAVENOUS | Status: DC | PRN
  Administered 2019-08-02: 8 ug via INTRAVENOUS
  Administered 2019-08-02: 12 ug via INTRAVENOUS

## 2019-08-02 MED ORDER — ONDANSETRON HCL 4 MG/2ML IJ SOLN
INTRAMUSCULAR | Status: DC | PRN
  Administered 2019-08-02: 4 mg via INTRAVENOUS

## 2019-08-02 MED ORDER — DEXMEDETOMIDINE HCL IN NACL 80 MCG/20ML IV SOLN
INTRAVENOUS | Status: AC
  Filled 2019-08-02: qty 20

## 2019-08-02 MED ORDER — DEXAMETHASONE SODIUM PHOSPHATE 10 MG/ML IJ SOLN
INTRAMUSCULAR | Status: DC | PRN
  Administered 2019-08-02: 10 mg via INTRAVENOUS

## 2019-08-02 MED ORDER — PHENYLEPHRINE HCL (PRESSORS) 10 MG/ML IV SOLN
INTRAVENOUS | Status: DC | PRN
  Administered 2019-08-02: 100 ug via INTRAVENOUS

## 2019-08-02 MED ORDER — FENTANYL CITRATE (PF) 100 MCG/2ML IJ SOLN
INTRAMUSCULAR | Status: DC | PRN
  Administered 2019-08-02 (×2): 50 ug via INTRAVENOUS

## 2019-08-02 MED ORDER — FENTANYL CITRATE (PF) 100 MCG/2ML IJ SOLN
INTRAMUSCULAR | Status: AC
  Filled 2019-08-02: qty 2

## 2019-08-02 MED ORDER — THROMBIN 5000 UNITS EX SOLR
CUTANEOUS | Status: DC | PRN
Start: 2019-08-02 — End: 2019-08-02
  Administered 2019-08-02: 5000 [IU] via TOPICAL

## 2019-08-02 MED ORDER — BUPIVACAINE-EPINEPHRINE (PF) 0.5% -1:200000 IJ SOLN
INTRAMUSCULAR | Status: DC | PRN
  Administered 2019-08-02: 10 mL

## 2019-08-02 MED ORDER — PROPOFOL 10 MG/ML IV BOLUS
INTRAVENOUS | Status: AC
  Filled 2019-08-02: qty 20

## 2019-08-02 SURGICAL SUPPLY — 52 items
AGENT HMST MTR 8 SURGIFLO (HEMOSTASIS) ×1
BINDER ABDOMINAL 12 ML 46-62 (SOFTGOODS) ×2 IMPLANT
BLADE SURG 15 STRL LF DISP TIS (BLADE) ×1 IMPLANT
BLADE SURG 15 STRL SS (BLADE) ×2
CANISTER SUCT 1200ML W/VALVE (MISCELLANEOUS) ×4 IMPLANT
CHLORAPREP W/TINT 26 (MISCELLANEOUS) ×4 IMPLANT
CNTNR SPEC 2.5X3XGRAD LEK (MISCELLANEOUS) ×1
CONT SPEC 4OZ STER OR WHT (MISCELLANEOUS) ×1
CONT SPEC 4OZ STRL OR WHT (MISCELLANEOUS) ×1
CONTAINER SPEC 2.5X3XGRAD LEK (MISCELLANEOUS) ×1 IMPLANT
COUNTER NEEDLE 20/40 LG (NEEDLE) ×2 IMPLANT
COVER LIGHT HANDLE STERIS (MISCELLANEOUS) ×4 IMPLANT
COVER WAND RF STERILE (DRAPES) ×2 IMPLANT
DERMABOND ADVANCED (GAUZE/BANDAGES/DRESSINGS)
DERMABOND ADVANCED .7 DNX12 (GAUZE/BANDAGES/DRESSINGS) IMPLANT
DRAPE C-ARM 42X72 X-RAY (DRAPES) ×4 IMPLANT
DRAPE LAPAROTOMY 100X77 ABD (DRAPES) ×2 IMPLANT
DRAPE SURG 17X11 SM STRL (DRAPES) ×8 IMPLANT
DRSG TEGADERM 4X4.75 (GAUZE/BANDAGES/DRESSINGS) ×4 IMPLANT
DRSG TELFA 4X3 1S NADH ST (GAUZE/BANDAGES/DRESSINGS) ×2 IMPLANT
ELECT REM PT RETURN 9FT ADLT (ELECTROSURGICAL) ×2
ELECTRODE REM PT RTRN 9FT ADLT (ELECTROSURGICAL) ×1 IMPLANT
GAUZE SPONGE 4X4 12PLY STRL (GAUZE/BANDAGES/DRESSINGS) ×4 IMPLANT
GLOVE BIOGEL PI IND STRL 7.0 (GLOVE) ×1 IMPLANT
GLOVE BIOGEL PI INDICATOR 7.0 (GLOVE) ×1
GLOVE INDICATOR 8.0 STRL GRN (GLOVE) ×4 IMPLANT
GLOVE SURG SYN 7.0 (GLOVE) ×4 IMPLANT
GLOVE SURG SYN 8.0 (GLOVE) ×4 IMPLANT
GOWN STRL REUS W/ TWL LRG LVL3 (GOWN DISPOSABLE) ×1 IMPLANT
GOWN STRL REUS W/ TWL XL LVL3 (GOWN DISPOSABLE) ×1 IMPLANT
GOWN STRL REUS W/TWL LRG LVL3 (GOWN DISPOSABLE) ×2
GOWN STRL REUS W/TWL XL LVL3 (GOWN DISPOSABLE) ×2
GRADUATE 1200CC STRL 31836 (MISCELLANEOUS) ×2 IMPLANT
KIT REFILL (MISCELLANEOUS) ×1
KIT REFILL CATH SYNCHROMED II (MISCELLANEOUS) ×1 IMPLANT
KIT TURNOVER KIT A (KITS) ×2 IMPLANT
MARKER SKIN DUAL TIP RULER LAB (MISCELLANEOUS) ×4 IMPLANT
NEEDLE HYPO 22GX1.5 SAFETY (NEEDLE) ×2 IMPLANT
NS IRRIG 1000ML POUR BTL (IV SOLUTION) ×2 IMPLANT
PACK LAMINECTOMY NEURO (CUSTOM PROCEDURE TRAY) ×2 IMPLANT
PAD ARMBOARD 7.5X6 YLW CONV (MISCELLANEOUS) ×2 IMPLANT
POUCH TYRX NEURO LRG (Mesh General) ×2 IMPLANT
PUMP SYNCHROMED II 40ML RESVR (Neuro Prosthesis/Implant) ×2 IMPLANT
SPOGE SURGIFLO 8M (HEMOSTASIS) ×2
SPONGE SURGIFLO 8M (HEMOSTASIS) ×1 IMPLANT
SUT POLYSORB 2-0 5X18 GS-10 (SUTURE) ×6 IMPLANT
SUT SILK 2 0 PERMA HAND 18 BK (SUTURE) ×12 IMPLANT
SUT VIC AB 0 CT1 18XCR BRD 8 (SUTURE) ×3 IMPLANT
SUT VIC AB 0 CT1 8-18 (SUTURE) ×6
SYR 20ML LL LF (SYRINGE) ×4 IMPLANT
TOWEL OR 17X26 4PK STRL BLUE (TOWEL DISPOSABLE) ×4 IMPLANT
TUBING CONNECTING 10 (TUBING) ×2 IMPLANT

## 2019-08-02 NOTE — Anesthesia Postprocedure Evaluation (Signed)
Anesthesia Post Note  Patient: Darrell White  Procedure(s) Performed: REPLACEMENT RIGHT ABDOMINAL PROGRAMMABLE MORPHINE PUMP (Right )  Patient location during evaluation: PACU Anesthesia Type: General Level of consciousness: awake and alert and oriented Pain management: pain level controlled Vital Signs Assessment: post-procedure vital signs reviewed and stable Respiratory status: spontaneous breathing Cardiovascular status: blood pressure returned to baseline Anesthetic complications: no   No complications documented.   Last Vitals:  Vitals:   08/02/19 1300 08/02/19 1310  BP: 133/72 (!) 142/65  Pulse: 67 68  Resp: 10 14  Temp: (!) 36.2 C (!) 36.1 C  SpO2: 98% 99%    Last Pain:  Vitals:   08/02/19 1310  TempSrc: Temporal  PainSc: 6                  Arlis Everly

## 2019-08-02 NOTE — Anesthesia Preprocedure Evaluation (Addendum)
Anesthesia Evaluation  Patient identified by MRN, date of birth, ID band Patient awake    Reviewed: Allergy & Precautions, NPO status , Patient's Chart, lab work & pertinent test results  Airway Mallampati: I  TM Distance: >3 FB Neck ROM: Limited   Comment: Very limited neck extension since radiation to head/neck in 2019  Dental  (+) Edentulous Upper, Edentulous Lower   Pulmonary COPD,  COPD inhaler, Current Smoker,  Hx lung ca s/p RUL lobectomy 55 pack year history, still smoking 2 ppd  Stopped using spiriva on his own Uses albuterol occasionally throughout the week   Pulmonary exam normal breath sounds clear to auscultation       Cardiovascular hypertension, Pt. on home beta blockers and Pt. on medications Normal cardiovascular exam+ dysrhythmias Atrial Fibrillation  Rhythm:Regular Rate:Normal  PAF- amio, metoprolol, eliquis HLD  eliquis- last dose 4/22   Neuro/Psych PSYCHIATRIC DISORDERS Anxiety Depression HOH S/p ACDF  Neuromuscular disease    GI/Hepatic Neg liver ROS, GERD  Controlled and Medicated,Mass Base of Tongue- Right side   Endo/Other  Hypothyroidism   Renal/GU negative Renal ROS  negative genitourinary   Musculoskeletal  (+) Arthritis , Osteoarthritis,    Abdominal Normal abdominal exam  (+) - obese,   Peds  Hematology negative hematology ROS (+)   Anesthesia Other Findings 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 S/p radiation to head/neck after that biopsy  Took oxycodone 10mg  this AM- has been on chronic narcotics since 1991  Reproductive/Obstetrics negative OB ROS                             Anesthesia Physical  Anesthesia Plan  ASA: III  Anesthesia Plan: General   Post-op Pain Management:    Induction: Intravenous  PONV Risk Score and Plan: 1 and Ondansetron, Dexamethasone and Treatment may vary due to age or medical  condition  Airway Management Planned: LMA  Additional Equipment: None  Intra-op Plan:   Post-operative Plan: Extubation in OR  Informed Consent: I have reviewed the patients History and Physical, chart, labs and discussed the procedure including the risks, benefits and alternatives for the proposed anesthesia with the patient or authorized representative who has indicated his/her understanding and acceptance.     Dental advisory given  Plan Discussed with: CRNA  Anesthesia Plan Comments:        Anesthesia Quick Evaluation

## 2019-08-02 NOTE — Interval H&P Note (Signed)
History and Physical Interval Note:  08/02/2019 10:23 AM  Darrell White  has presented today for surgery, with the diagnosis of chronic pain, failed pump.  The various methods of treatment have been discussed with the patient and family. After consideration of risks, benefits and other options for treatment, the patient has consented to  Procedure(s): REPLACEMENT RIGHT ABDOMINAL PROGRAMMABLE MORPHINE PUMP (Right) as a surgical intervention.  The patient's history has been reviewed, patient examined, no change in status, stable for surgery.  I have reviewed the patient's chart and labs.  Questions were answered to the patient's satisfaction.     Deetta Perla

## 2019-08-02 NOTE — Discharge Summary (Signed)
Procedure: replacement of intrathecal pump Procedure date: 08/02/2019 Diagnosis: chronic pain   History: Darrell White is s/p removal of prior intrathecal pump and implantation of new intrathecal pump POD0: Tolerated procedure well. Evaluated in post op recovery still disoriented from anesthesia but able to answer questions and obey commands.   Physical Exam: Vitals:   08/02/19 1230 08/02/19 1235  BP: (!) 149/74 (!) 149/74  Pulse: 82   Resp: 18 10  Temp: (!) 97.2 F (36.2 C)   SpO2: 100% 99%    General: Alert and oriented, lying in bed Strength:5/5 throughout  Sensation: intact and symmetric throughout  Skin: dressing over R abdomen clean, dry, intact  Data:  Recent Labs  Lab 08/02/19 0942  NA 138  K 4.2  CL 103  CO2 28  BUN 14  CREATININE 0.90  GLUCOSE 108*  CALCIUM 8.8*   No results for input(s): AST, ALT, ALKPHOS in the last 168 hours.  Invalid input(s): TBILI   Recent Labs  Lab 07/29/19 1427 07/29/19 1427 08/02/19 0942  WBC 5.9   < > 5.0  HGB 12.3*   < > 12.3*  HCT 36.5*   < > 36.8*  PLT 200  --  187   < > = values in this interval not displayed.   Recent Labs  Lab 08/02/19 0942  APTT 39*  INR 1.1          Assessment/Plan:  Darrell White is POD0 s/p  s/p removal of prior intrathecal pump and implantation of new intrathecal pump  Once he is able to urinate, ambulate, and tolerate PO, he is able to be discharged home.   Pain management per pain clinic.    Lonell Face, NP Department of Neurosurgery

## 2019-08-02 NOTE — Discharge Instructions (Signed)
NEUROSURGERY DISCHARGE INSTRUCTIONS  The following are instructions to help in your recovery once you have been discharged from the hospital. Even if you feel well, it is important that you follow these activity guidelines.  What to do after you leave the hospital:  Recommended diet:  Increase protein intake to promote wound healing. You may return to your usual diet. However, you may experience discomfort when swallowing in the first month after your surgery. This is normal. You may find that softer foods are more comfortable for you to swallow. Be sure to stay hydrated.   Recommended activity: No bending, lifting, or twisting ("BLT"). Avoid lifting objects heavier than 10 pounds (gallon milk jug). Where possible, avoid household activities that involve lifting, bending, reaching, pushing, or pulling such as laundry, vacuuming, grocery shopping, and childcare. Try to arrange for help from friends and family for these activities while you heal.   Increase physical activity slowly as tolerated. Taking short walks is encouraged, but avoid strenuous exercise. Do not jog, run, bicycle, lift weights, or participate in any other exercises unless specifically allowed by your doctor.   You should not drive until cleared by your doctor.   Until released by your doctor, you should not return to work or school. You should rest at home and let your body heal.   You may shower the day after your surgery. After showering, lightly dab your incision dry. Do not take a tub bath or go swimming until approved by your doctor at your follow-up appointment.   If you smoke, we strongly recommend that you quit. Smoking has been proven to interfere with normal bone healing and will dramatically reduce the success rate of your surgery. Please contact [facility] (800-QUIT-NOW) and use the resources at [email or web address].com for assistance in stopping smoking.   Medications  Do not take Eliquis until July 2nd.   * Do not  take anti-inflammatory medications for 3 days after surgery (naproxen [Aleve], ibuprofen [Advil, Motrin], celecoxib [Celebrex], etc.).   You may restart home medications.  Wound Care Instructions  If you have a dressing on your incision, remove it two days after your surgery. Keep your incision area clean and dry.   If you have staples or stitches on your incision, you should have a follow up scheduled for removal. If you do not have staples or stitches, you will have steri-strips (small pieces of surgical tape) or Dermabond glue. The steri-strips/glue should begin to peel away within about a week (it is fine if the steri-strips fall off before then). If the strips are still in place one week after your surgery, you may gently remove them.    Please Report any of the following: Should you experience any of the following, contact us immediately:   New numbness or weakness   Pain that is progressively getting worse, and is not relieved by your pain medication, muscle relaxers, rest, and warm compresses   Bleeding, redness, swelling, pain, or drainage from surgical incision   Chills or flu-like symptoms   Fever greater than 101.0 F (38.3 C)   Inability to eat, drink fluids, or take medications   Problems with bowel or bladder functions   Difficulty breathing or shortness of breath   Warmth, tenderness, or swelling in your calf    Additional Follow up appointments During office hours (Monday-Friday 9 am to 5 pm), please call your physician at [phone] and ask for Berdine Addison.   After hours and weekends, please call [phone] and an answering service will  put you in touch with either Dr. Lacinda Axon or Dr. Izora Ribas.   For a life-threatening emergency, call 911

## 2019-08-02 NOTE — Transfer of Care (Signed)
Immediate Anesthesia Transfer of Care Note  Patient: Darrell White  Procedure(s) Performed: REPLACEMENT RIGHT ABDOMINAL PROGRAMMABLE MORPHINE PUMP (Right )  Patient Location: PACU  Anesthesia Type:General  Level of Consciousness: drowsy and patient cooperative  Airway & Oxygen Therapy: Patient Spontanous Breathing  Post-op Assessment: Report given to RN and Post -op Vital signs reviewed and stable  Post vital signs: Reviewed and stable  Last Vitals:  Vitals Value Taken Time  BP 149/74 08/02/19 1235  Temp    Pulse 71 08/02/19 1237  Resp 10 08/02/19 1237  SpO2 98 % 08/02/19 1237  Vitals shown include unvalidated device data.  Last Pain:  Vitals:   08/02/19 0928  TempSrc: Tympanic  PainSc: 0-No pain         Complications: No complications documented.

## 2019-08-02 NOTE — Anesthesia Procedure Notes (Signed)
Procedure Name: LMA Insertion Date/Time: 08/02/2019 11:22 AM Performed by: Jonna Clark, CRNA Pre-anesthesia Checklist: Patient identified, Patient being monitored, Timeout performed, Emergency Drugs available and Suction available Patient Re-evaluated:Patient Re-evaluated prior to induction Oxygen Delivery Method: Circle system utilized Preoxygenation: Pre-oxygenation with 100% oxygen Induction Type: IV induction Ventilation: Mask ventilation without difficulty LMA: LMA inserted LMA Size: 4.0 Tube type: Oral Number of attempts: 1 Placement Confirmation: positive ETCO2 and breath sounds checked- equal and bilateral Tube secured with: Tape Dental Injury: Teeth and Oropharynx as per pre-operative assessment

## 2019-08-02 NOTE — H&P (Signed)
ARLYN BUERKLE is an 69 y.o. male.   Chief Complaint: Pain pump end of service HPI: Mr. Nehring is here for evaluation of a end of battery life for his intrathecal morphine pump. He states that the pump is providing great control of his pain and the last surgery he had for this was 2014. He has not had any surgery for it since then but he did have the battery replaced then. He states that he can feel when the medication is getting low as he starts to have a lot more pain. However with the pump, the pain has been dramatically better. During the last refill, it did indicate that it was near the end of life and is here for battery/pump replacement.   Past Medical History:  Diagnosis Date  . Anxiety   . Arthritis   . Bronchitis 03/25/2017  . Cancer ([place])    right base of tongue  . Depression   . Dysrhythmia    AFIB  . GERD (gastroesophageal reflux disease)   . HOH (hard of hearing)   . Hypercholesteremia   . Hypertension   . Hypothyroid   . 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 Neuropathy   . Squamous cell carcinoma of tongue ([place]) 04/02/2016    Past Surgical History:  Procedure Laterality Date  . BACK SURGERY     had total of 7 surgeries with rods and plates  . CATARACT EXTRACTION W/PHACO Left 04/09/2012   Procedure: CATARACT EXTRACTION PHACO AND INTRAOCULAR LENS PLACEMENT (IOC);  Surgeon: Tonny Branch, MD;  Location: [facility];  Service: Ophthalmology;  Laterality: Left;  CDE: 12.44  . CERVICAL DISC SURGERY    . CT LUNG SCREENING  06/16/2017  . DIRECT LARYNGOSCOPY N/A 03/26/2016   Procedure: DIRECT LARYNGOSCOPY;  Surgeon: Leta Baptist, MD;  Location: [facility];  Service: ENT;  Laterality: N/A;  . DIRECT LARYNGOSCOPY N/A 05/31/2019   Procedure: DIRECT LARYNGOSCOPY WITH BIOPSY;  Surgeon: Leta Baptist, MD;  Location: [facility];  Service: ENT;  Laterality: N/A;  . EXCISION OF TONGUE LESION Right 03/26/2016   Procedure: BIOPSY OF RIGHT TONGUE  BASE MASS;  Surgeon: Leta Baptist, MD;  Location: [facility];  Service: ENT;  Laterality: Right;  . KNEE ARTHROSCOPY     right knee  . MOUTH SURGERY     removal of cancer  . OTHER SURGICAL HISTORY     insertion of pain pump  . SHOULDER ARTHROSCOPY     bilateral  . SPINAL FUSION    . SPINE SURGERY     insertion of morphine pump into spine    Family History  Problem Relation Age of Onset  . Cancer Father   . Cancer Brother        Prostate cancer   Social History:  reports that he has been smoking cigarettes. He has a 110.00 pack-year smoking history. He has never used smokeless tobacco. He reports that he does not drink alcohol and does not use drugs.  Allergies:  Allergies  Allergen Reactions  . Nsaids Other (See Comments)    "upset stomach" per pt    Medications Prior to Admission  Medication Sig Dispense Refill  . albuterol (PROVENTIL HFA;VENTOLIN HFA) 108 (90 Base) MCG/ACT inhaler Inhale 2 puffs into the lungs every 6 (six) hours as needed for wheezing or shortness of breath.     Marland Kitchen amiodarone (PACERONE) 200 MG tablet Take 200 mg by mouth  daily.     . atorvastatin (LIPITOR) 40 MG tablet Take 40 mg by mouth daily.     Marland Kitchen ELIQUIS 5 MG TABS tablet Take 5 mg by mouth 2 (two) times daily.     Marland Kitchen gabapentin (NEURONTIN) 300 MG capsule Take 300 mg by mouth daily as needed (pain).     Marland Kitchen levothyroxine (SYNTHROID) 125 MCG tablet Take 125 mcg by mouth daily before breakfast.     . metoprolol succinate (TOPROL-XL) 50 MG 24 hr tablet Take 50 mg by mouth daily.     . mirtazapine (REMERON) 45 MG tablet Take 45 mg by mouth at bedtime.    . Oxycodone HCl 10 MG TABS Take 1 tablet (10 mg total) by mouth every 6 (six) hours as needed. Must last 30 days 120 tablet 0  . traZODone (DESYREL) 50 MG tablet Take 200 mg by mouth at bedtime.     . diclofenac sodium (VOLTAREN) 1 % GEL Apply 2 g topically 3 (three) times daily as needed.     . Oxycodone HCl 10 MG TABS Take 1 tablet (10 mg total)  by mouth every 6 (six) hours as needed. Must last 30 days 120 tablet 0  . [START ON 08/29/2019] Oxycodone HCl 10 MG TABS Take 1 tablet (10 mg total) by mouth every 6 (six) hours as needed. Must last 30 days 120 tablet 0  . sotalol (BETAPACE) 120 MG tablet Take 120 mg by mouth daily. (Patient not taking: Reported on 07/28/2019)    . tiotropium (SPIRIVA) 18 MCG inhalation capsule Place 18 mcg into inhaler and inhale daily. (Patient not taking: Reported on 07/28/2019)      Results for orders placed or performed during the hospital encounter of 08/02/19 (from the past 48 hour(s))  CBC     Status: Abnormal   Collection Time: 08/02/19  9:42 AM  Result Value Ref Range   WBC 5.0 4.0 - 10.5 K/uL   RBC 4.01 (L) 4.22 - 5.81 MIL/uL   Hemoglobin 12.3 (L) 13.0 - 17.0 g/dL   HCT 36.8 (L) 39 - 52 %   MCV 91.8 80.0 - 100.0 fL   MCH 30.7 26.0 - 34.0 pg   MCHC 33.4 30.0 - 36.0 g/dL   RDW 13.4 11.5 - 15.5 %   Platelets 187 150 - 400 K/uL   nRBC 0.0 0.0 - 0.2 %    Comment: Performed at [facility], [address] [place], [place] [address]  Type and screen     Status: None (Preliminary result)   Collection Time: 08/02/19  9:42 AM  Result Value Ref Range   ABO/RH(D) PENDING    Antibody Screen PENDING    Sample Expiration      08/05/2019,2359 Performed at [facility], [address] [place], [place] [address]   ABO/Rh     Status: None (Preliminary result)   Collection Time: 08/02/19  9:50 AM  Result Value Ref Range   ABO/RH(D) PENDING    No results found.  Review of Systems General ROS: Negative Psychological ROS: Negative Ophthalmic ROS: Negative ENT ROS: Negative Hematological and Lymphatic ROS: Negative  Endocrine ROS: Negative Respiratory ROS: Negative Cardiovascular ROS: Negative Gastrointestinal ROS: Negative Genito-Urinary ROS: Negative Musculoskeletal ROS: Positive for back pain Neurological ROS: Positive for leg pain Dermatological ROS: Negative  Blood  pressure 122/72, pulse 81, temperature 97.8 F (36.6 C), temperature source Tympanic, resp. rate 14, weight 77.7 kg, SpO2 99 %. Physical Exam  General appearance: Alert, cooperative, in no acute  distress Head: Normocephalic, atraumatic Eyes: Normal, EOM intact Oropharynx: Wearing facemask Back: Well-healed midline incision CV: Regular rate and rhythm Pulm: Clear to auscultation Abdomen: Thin male, well-healed incision with a pump in the right lower quadrant Ext: No edema in LE bilaterally  Neurologic exam:  Mental status: alertness: alert, affect: normal Speech: fluent and clear Motor:strength symmetric 5/5 in bilateral hip flexion, knee flexion, knee extension with dorsiflexion and plantarflexion Sensory: intact to light touch in bilateral lower extremities Gait: normal    X-ray lumbar spine from 2018: There is a right lower quadrant pump generator with catheter appearing to be terminating in the musculature. There is an intrathecal catheter that is abandoned. There is another stimulator noted on the left side with electrodes in the lower lumbar and sacral area. There is evidence of a previous three level lumbar fusion.    Assessment/Plan I have discussed with patient and his provider concern of pump disconnection, however patient has been very satisfied with his level of pain control and we will proceed with pump replacement only.   Deetta Perla, MD 08/02/2019, 10:20 AM

## 2019-08-03 ENCOUNTER — Encounter: Payer: Self-pay | Admitting: Neurosurgery

## 2019-08-03 NOTE — Op Note (Signed)
Operative Note  SURGERY DATE:08/03/2019  PRE-OP DIAGNOSIS: Chronic pain syndrome  POST-OP DIAGNOSIS:Post-Op Diagnosis Codes: Chronic pain syndrome  Procedure(s) with comments: Right lower quadrant abdominal pump replacement  SURGEON:  * Malen Gauze, MD       Marin Olp, NP-assisting  ANESTHESIA:General  OPERATIVE FINDINGS:Successful placement of intrathecal morphine pump   Indication Mr Menna seen in clinic on 6/15after having been treated with morphine pump for years. The pump was reaching need of life.  The patient wished to proceed to pump replacement to achieve better pain control. Risks including hematoma, infection, failure of pain relief were discussed.    Procedure The patient was brought to the operating room where vascular access was obtained andhe wasintubated by the anesthesia service.  Antibiotics were given. The patient was placed in a supine position. The patient was prepped and draped in a sterile fashion.  A hard time out was performed. Local anesthetic was instilled into abdominal incision.   The right abdominal incision was opened and taken through the subcutaneous tissue where the pump was identified.    The pump was dissected free from the surrounding tissue.  A needle was placed in the access port and no fluid was withdrawn.  The pump was removed and disconnected from the catheter.  The pre-existing morphine was withdrawn and inserted into a new pump.  The pocket was irrigated profusely.  Hemostasis was obtained.  Vancomycin powder was placed within the pocket. The pump was then placed back into the pocket with antibiotic pouch.    It was reconnected to the catheter.  This was secured with a silk suture.  The pump was programmed with pre-existing parameters.  Then, the incision was closed with combination of 0, 2-0 vicryls. The skin was closed with 3-0 Nylon. Sterile dressings were applied. The patient was returned to supine  position and extubated. The patient was seen to be moving lower extremities symmetrically and was taken to PACU for recovery. The family was updated and all questions answered.   ESTIMATED BLOOD LOSS: 10cc  SPECIMENS Explanted morphine pump  IMPLANT POUCH Durenda Hurt LARGE - [id number]  Inventory Item: [facility] NEURO LARGE Serial no.:  Model/Cat no.: [id number]  Implant name: Starr Sinclair LARGE - [id number] Laterality: Right Area: Abdomen  Manufacturer: [place] Date of Manufacture:    Action: Implanted Number Used: 1   Device Identifier:  Device Identifier Type:    PUMP SYNCHROMED 2 - [id number]  Inventory Item: PUMP SYNCHROMED 2 Serial no.: [id number] Model/Cat no.: [id number]  Implant name: PUMP SYNCHROMED 2 - [id number] Laterality: Right Area: Abdomen  Manufacturer: [employer] NEUROMOD PAIN MGMT Date of Manufacture:    Action: Implanted Number Used: 1   Device Identifier:  Device Identifier Type:        I performed the case in its entiretywith the assistance of Marin Olp, [place].  Deetta Perla, [place]

## 2019-08-04 DIAGNOSIS — I10 Essential (primary) hypertension: Secondary | ICD-10-CM | POA: Diagnosis not present

## 2019-08-04 DIAGNOSIS — M1612 Unilateral primary osteoarthritis, left hip: Secondary | ICD-10-CM | POA: Diagnosis not present

## 2019-08-04 DIAGNOSIS — E7849 Other hyperlipidemia: Secondary | ICD-10-CM | POA: Diagnosis not present

## 2019-08-04 DIAGNOSIS — E039 Hypothyroidism, unspecified: Secondary | ICD-10-CM | POA: Diagnosis not present

## 2019-08-05 ENCOUNTER — Telehealth: Payer: Self-pay | Admitting: Pain Medicine

## 2019-08-05 NOTE — Telephone Encounter (Signed)
yes

## 2019-08-05 NOTE — Telephone Encounter (Signed)
Patient would like to have shoulder injection on same day as his pumpt refill, 8-24, is it ok to schedule for this?

## 2019-09-07 ENCOUNTER — Other Ambulatory Visit: Payer: Self-pay

## 2019-09-07 MED ORDER — PAIN MANAGEMENT IT PUMP REFILL: 1.0000 | Freq: Once | INTRATHECAL | 0 refills | Status: AC

## 2019-09-28 ENCOUNTER — Encounter: Payer: Self-pay | Admitting: Pain Medicine

## 2019-09-28 ENCOUNTER — Other Ambulatory Visit: Payer: Self-pay

## 2019-09-28 ENCOUNTER — Ambulatory Visit: Payer: PPO | Attending: Pain Medicine | Admitting: Pain Medicine

## 2019-09-28 VITALS — BP 106/63 | HR 66 | Temp 97.3°F | Resp 14 | Ht 68.0 in | Wt 151.0 lb

## 2019-09-28 DIAGNOSIS — G893 Neoplasm related pain (acute) (chronic): Secondary | ICD-10-CM

## 2019-09-28 DIAGNOSIS — M19012 Primary osteoarthritis, left shoulder: Secondary | ICD-10-CM | POA: Insufficient documentation

## 2019-09-28 DIAGNOSIS — Z7901 Long term (current) use of anticoagulants: Secondary | ICD-10-CM | POA: Insufficient documentation

## 2019-09-28 DIAGNOSIS — M25512 Pain in left shoulder: Secondary | ICD-10-CM | POA: Insufficient documentation

## 2019-09-28 DIAGNOSIS — M961 Postlaminectomy syndrome, not elsewhere classified: Secondary | ICD-10-CM | POA: Insufficient documentation

## 2019-09-28 DIAGNOSIS — G894 Chronic pain syndrome: Secondary | ICD-10-CM | POA: Insufficient documentation

## 2019-09-28 DIAGNOSIS — G8929 Other chronic pain: Secondary | ICD-10-CM

## 2019-09-28 MED FILL — Medication: INTRATHECAL | Qty: 1 | Status: AC

## 2019-09-28 NOTE — Patient Instructions (Addendum)
____________________________________________________________________________________________   Mirian Mo 3 FULL DAYS PRIOR TO PROCEDURE   Post-Procedure Discharge Instructions  Instructions:  Apply ice:   Purpose: This will minimize any swelling and discomfort after procedure.   When: Day of procedure, as soon as you get home.  How: Fill a plastic sandwich bag with crushed ice. Cover it with a small towel and apply to injection site.  How long: (15 min on, 15 min off) Apply for 15 minutes then remove x 15 minutes.  Repeat sequence on day of procedure, until you go to bed.  Apply heat:   Purpose: To treat any soreness and discomfort from the procedure.  When: Starting the next day after the procedure.  How: Apply heat to procedure site starting the day following the procedure.  How long: May continue to repeat daily, until discomfort goes away.  Food intake: Start with clear liquids (like water) and advance to regular food, as tolerated.   Physical activities: Keep activities to a minimum for the first 8 hours after the procedure. After that, then as tolerated.  Driving: If you have received any sedation, be responsible and do not drive. You are not allowed to drive for 24 hours after having sedation.  Blood thinner: (Applies only to those taking blood thinners) You may restart your blood thinner 6 hours after your procedure.  Insulin: (Applies only to Diabetic patients taking insulin) As soon as you can eat, you may resume your normal dosing schedule.  Infection prevention: Keep procedure site clean and dry. Shower daily and clean area with soap and water.  Post-procedure Pain Diary: Extremely important that this be done correctly and accurately. Recorded information will be used to determine the next step in treatment. For the purpose of accuracy, follow these rules:  Evaluate only the area treated. Do not report or include pain from an untreated area. For the purpose  of this evaluation, ignore all other areas of pain, except for the treated area.  After your procedure, avoid taking a long nap and attempting to complete the pain diary after you wake up. Instead, set your alarm clock to go off every hour, on the hour, for the initial 8 hours after the procedure. Document the duration of the numbing medicine, and the relief you are getting from it.  Do not go to sleep and attempt to complete it later. It will not be accurate. If you received sedation, it is likely that you were given a medication that may cause amnesia. Because of this, completing the diary at a later time may cause the information to be inaccurate. This information is needed to plan your care.  Follow-up appointment: Keep your post-procedure follow-up evaluation appointment after the procedure (usually 2 weeks for most procedures, 6 weeks for radiofrequencies). DO NOT FORGET to bring you pain diary with you.   Expect: (What should I expect to see with my procedure?)  From numbing medicine (AKA: Local Anesthetics): Numbness or decrease in pain. You may also experience some weakness, which if present, could last for the duration of the local anesthetic.  Onset: Full effect within 15 minutes of injected.  Duration: It will depend on the type of local anesthetic used. On the average, 1 to 8 hours.   From steroids (Applies only if steroids were used): Decrease in swelling or inflammation. Once inflammation is improved, relief of the pain will follow.  Onset of benefits: Depends on the amount of swelling present. The more swelling, the longer it will take for the  benefits to be seen. In some cases, up to 10 days.  Duration: Steroids will stay in the system x 2 weeks. Duration of benefits will depend on multiple posibilities including persistent irritating factors.  Side-effects: If present, they may typically last 2 weeks (the duration of the steroids).  Frequent: Cramps (if they occur, drink  Gatorade and take over-the-counter Magnesium 450-500 mg once to twice a day); water retention with temporary weight gain; increases in blood sugar; decreased immune system response; increased appetite.  Occasional: Facial flushing (red, warm cheeks); mood swings; menstrual changes.  Uncommon: Long-term decrease or suppression of natural hormones; bone thinning. (These are more common with higher doses or more frequent use. This is why we prefer that our patients avoid having any injection therapies in other practices.)   Very Rare: Severe mood changes; psychosis; aseptic necrosis.  From procedure: Some discomfort is to be expected once the numbing medicine wears off. This should be minimal if ice and heat are applied as instructed.  Call if: (When should I call?)  You experience numbness and weakness that gets worse with time, as opposed to wearing off.  New onset bowel or bladder incontinence. (Applies only to procedures done in the spine)  Emergency Numbers:  Durning business hours (Monday - Thursday, 8:00 AM - 4:00 PM) (Friday, 9:00 AM - 12:00 Noon): [phone]  After hours: [phone]  NOTE: If you are having a problem and are unable connect with, or to talk to a provider, then go to your nearest urgent care or emergency department. If the problem is serious and urgent, please call 911. ____________________________________________________________________________________________   ____________________________________________________________________________________________  Preparing for your procedure (without sedation)  Procedure appointments are limited to planned procedures: . No Prescription Refills. . No disability issues will be discussed. . No medication changes will be discussed.  Instructions: . Oral Intake: Do not eat or drink anything for at least 6 hours prior to your procedure. (Exception: Blood Pressure Medication. See below.) . Transportation: Unless  otherwise stated by your physician, you may drive yourself after the procedure. . Blood Pressure Medicine: Do not forget to take your blood pressure medicine with a sip of water the morning of the procedure. If your Diastolic (lower reading)is above 100 mmHg, elective cases will be cancelled/rescheduled. . Blood thinners: These will need to be stopped for procedures. Notify our staff if you are taking any blood thinners. Depending on which one you take, there will be specific instructions on how and when to stop it. . Diabetics on insulin: Notify the staff so that you can be scheduled 1st case in the morning. If your diabetes requires high dose insulin, take only  of your normal insulin dose the morning of the procedure and notify the staff that you have done so. . Preventing infections: Shower with an antibacterial soap the morning of your procedure.  . Build-up your immune system: Take 1000 mg of Vitamin C with every meal (3 times a day) the day prior to your procedure. Marland Kitchen Antibiotics: Inform the staff if you have a condition or reason that requires you to take antibiotics before dental procedures. . Pregnancy: If you are pregnant, call and cancel the procedure. . Sickness: If you have a cold, fever, or any active infections, call and cancel the procedure. . Arrival: You must be in the facility at least 30 minutes prior to your scheduled procedure. . Children: Do not bring any children with you. . Dress appropriately: Bring dark clothing that you would not  mind if they get stained. . Valuables: Do not bring any jewelry or valuables.  Reasons to call and reschedule or cancel your procedure: (Following these recommendations will minimize the risk of a serious complication.) . Surgeries: Avoid having procedures within 2 weeks of any surgery. (Avoid for 2 weeks before or after any surgery). . Flu Shots: Avoid having procedures within 2 weeks of a flu shots or . (Avoid for 2 weeks before or after  immunizations). . Barium: Avoid having a procedure within 7-10 days after having had a radiological study involving the use of radiological contrast. (Myelograms, Barium swallow or enema study). . Heart attacks: Avoid any elective procedures or surgeries for the initial 6 months after a "Myocardial Infarction" (Heart Attack). . Blood thinners: It is imperative that you stop these medications before procedures. Let us know if you if you take any blood thinner.  . Infection: Avoid procedures during or within two weeks of an infection (including chest colds or gastrointestinal problems). Symptoms associated with infections include: Localized redness, fever, chills, night sweats or profuse sweating, burning sensation when voiding, cough, congestion, stuffiness, runny nose, sore throat, diarrhea, nausea, vomiting, cold or Flu symptoms, recent or current infections. It is specially important if the infection is over the area that we intend to treat. Marland Kitchen Heart and lung problems: Symptoms that may suggest an active cardiopulmonary problem include: cough, chest pain, breathing difficulties or shortness of breath, dizziness, ankle swelling, uncontrolled high or unusually low blood pressure, and/or palpitations. If you are experiencing any of these symptoms, cancel your procedure and contact your primary care physician for an evaluation.  Remember:  Regular Business hours are:  Monday to Thursday 8:00 AM to 4:00 PM  Provider's Schedule: Milinda Pointer, MD:  Procedure days: Tuesday and Thursday 7:30 AM to 4:00 PM  Gillis Santa, MD:  Procedure days: Monday and Wednesday 7:30 AM to 4:00 PM ____________________________________________________________________________________________   Mirian Mo 3FULL DAYS PRIOR TO PROCEDURE

## 2019-09-28 NOTE — Progress Notes (Signed)
PROVIDER NOTE: Information contained herein reflects review and annotations entered in association with encounter. Interpretation of such information and data should be left to medically-trained personnel. Information provided to patient can be located elsewhere in the medical record under "Patient Instructions". Document created using STT-dictation technology, any transcriptional errors that may result from process are unintentional.    Patient: Darrell White  Service Category: Procedure  Provider: Gaspar Cola, MD  DOB: Aug 07, 1950  DOS: 09/28/2019  Location: [facility]  MRN: [id number]  Setting: Ambulatory - outpatient  Referring Provider: [name], MD  Type: Established Patient  Specialty: Interventional Pain Management  PCP: [name], MD   Primary Reason for Visit: Interventional Pain Management Treatment. CC: Back Pain (lower)  Procedure:          Intrathecal Drug Delivery System (IDDS):  Type: Reservoir Refill [phone]) No rate change Region: Abdominal Laterality: Right  Type of Pump: Medtronic Synchromed II Delivery Route: Intrathecal Type of Pain Treated: Neuropathic/Nociceptive Primary Medication Class: Opioid/opiate  Medication, Concentration, Infusion Program, & Delivery Rate: Please see scanned programming printout.   Indications: 1. Chronic pain syndrome   2. Cancer-related pain   3. Failed back surgical syndrome   4. Chronic shoulder pain (Left)   5. Osteoarthritis of shoulder (Left)   6. Chronic anticoagulation (Eliquis)    Pain Assessment: Self-Reported Pain Score: 7 /10             Reported level is compatible with observation.         Today the patient wanted to have his shoulder injected but he did not stop his Eliquis as he had been instructed.  The procedure was canceled.  Pharmacotherapy Assessment  Analgesic: Oxycodone IR 10 mg, 1 tabl PO q 6 hrs (40 mg/day of oxycodone) MME/day: 60 mg/day.   Monitoring: [place] PMP: PDMP  reviewed during this encounter.       Pharmacotherapy: No side-effects or adverse reactions reported. Compliance: No problems identified. Effectiveness: Clinically acceptable. Plan: Refer to "POC".  UDS:  Summary  Date Value Ref Range Status  03/26/2018 FINAL  Final    Comment:    ==================================================================== TOXASSURE SELECT 13 (MW) ==================================================================== Test                             Result       Flag       Units Drug Present and Declared for Prescription Verification   Oxycodone                      1772         EXPECTED   ng/mg creat   Oxymorphone                    >4115        EXPECTED   ng/mg creat   Noroxycodone                   >4115        EXPECTED   ng/mg creat   Noroxymorphone                 1803         EXPECTED   ng/mg creat    Sources of oxycodone are scheduled prescription medications.    Oxymorphone, noroxycodone, and noroxymorphone are expected    metabolites of oxycodone. Oxymorphone is also available as a  scheduled prescription medication. Drug Present not Declared for Prescription Verification   Morphine                       1085         UNEXPECTED ng/mg creat    Potential sources of large amounts of morphine in the absence of    codeine include administration of morphine or use of heroin. ==================================================================== Test                      Result    Flag   Units      Ref Range   Creatinine              243              mg/dL      >=20 ==================================================================== Declared Medications:  The flagging and interpretation on this report are based on the  following declared medications.  Unexpected results may arise from  inaccuracies in the declared medications.  **Note: The testing scope of this panel includes these medications:  Oxycodone  **Note: The testing scope of this panel does  not include following  reported medications:  Acetaminophen (Tylenol)  Atorvastatin (Lipitor)  Docusate  Gabapentin (Neurontin)  Levothyroxine  Mirtazapine  Omeprazole  Sotalol  Tiotropium  Trazodone ==================================================================== For clinical consultation, please call [phone]. ====================================================================     Intrathecal Pump Therapy Assessment  Manufacturer: Medtronic Synchromed Type: Programmable Volume: 40 mL reservoir MRI compatibility: Yes   Drug content:  Primary Medication Class:Opioid Primary Medication:PF-Morphine(20 mg/mL) Secondary Medication:PF-Bupivacaine(20 mg/mL) Other Medication: No third medication   Programming:  Type: Simple continuous. See pump readout for details.   Changes:  Medication Change: None at this point Rate Change: No change in rate  Reported side-effects or adverse reactions: None reported  Effectiveness: Described as relatively effective, allowing for increase in activities of daily living (ADL) Clinically meaningful improvement in function (CMIF): Sustained CMIF goals met  Plan: Pump refill today  Pre-op Assessment:  Darrell White is a 69 y.o. (year old), male patient, seen today for interventional treatment. He  has a past surgical history that includes Mouth surgery; Cervical disc surgery; Shoulder arthroscopy; Back surgery; Spinal fusion; Spine surgery; Knee arthroscopy; Cataract extraction w/PHACO (Left, 04/09/2012); Direct laryngoscopy (N/A, 03/26/2016); Excision of tongue lesion (Right, 03/26/2016); OTHER SURGICAL HISTORY; CT LUNG SCREENING (06/16/2017); Direct laryngoscopy (N/A, 05/31/2019); and Intrathecal pump implant (Right, 08/02/2019). Darrell White has a current medication list which includes the following prescription(s): albuterol, amiodarone, atorvastatin, diclofenac sodium, gabapentin, levothyroxine, metoprolol succinate, mirtazapine, sotalol,  tiotropium, trazodone, oxycodone hcl, [START ON 11/03/2019] oxycodone hcl, and [START ON 12/03/2019] oxycodone hcl. His primarily concern today is the Back Pain (lower)  Initial Vital Signs:  Pulse/HCG Rate: 66  Temp: (!) 97.3 F (36.3 C) Resp: 14 BP: 106/63 SpO2: 98 %  BMI: Estimated body mass index is 22.96 kg/m as calculated from the following:   Height as of this encounter: $RemoveBeforeD'5\' 8"'qOVEkwfZhoGZQy$  (1.727 m).   Weight as of this encounter: 151 lb (68.5 kg).  Risk Assessment: Allergies: Reviewed. He is allergic to nsaids.  Allergy Precautions: None required Coagulopathies: Reviewed. None identified.  Blood-thinner therapy: None at this time Active Infection(s): Reviewed. None identified. Darrell White is afebrile  Site Confirmation: Darrell White was asked to confirm the procedure and laterality before marking the site Procedure checklist: Completed Consent: Before the procedure and under the influence of no sedative(s), amnesic(s), or anxiolytics, the patient was informed  of the treatment options, risks and possible complications. To fulfill our ethical and legal obligations, as recommended by the American Medical Association's Code of Ethics, I have informed the patient of my clinical impression; the nature and purpose of the treatment or procedure; the risks, benefits, and possible complications of the intervention; the alternatives, including doing nothing; the risk(s) and benefit(s) of the alternative treatment(s) or procedure(s); and the risk(s) and benefit(s) of doing nothing.  Darrell White was provided with information about the general risks and possible complications associated with most interventional procedures. These include, but are not limited to: failure to achieve desired goals, infection, bleeding, organ or nerve damage, allergic reactions, paralysis, and/or death.  In addition, he was informed of those risks and possible complications associated to this particular procedure, which include, but are  not limited to: damage to the implant; failure to decrease pain; local, systemic, or serious CNS infections, intraspinal abscess with possible cord compression and paralysis, or life-threatening such as meningitis; bleeding; organ damage; nerve injury or damage with subsequent sensory, motor, and/or autonomic system dysfunction, resulting in transient or permanent pain, numbness, and/or weakness of one or several areas of the body; allergic reactions, either minor or major life-threatening, such as anaphylactic or anaphylactoid reactions.  Furthermore, Darrell White was informed of those risks and complications associated with the medications. These include, but are not limited to: allergic reactions (i.e.: anaphylactic or anaphylactoid reactions); endorphine suppression; bradycardia and/or hypotension; water retention and/or peripheral vascular relaxation leading to lower extremity edema and possible stasis ulcers; respiratory depression and/or shortness of breath; decreased metabolic rate leading to weight gain; swelling or edema; medication-induced neural toxicity; particulate matter embolism and blood vessel occlusion with resultant organ, and/or nervous system infarction; and/or intrathecal granuloma formation with possible spinal cord compression and permanent paralysis.  Before refilling the pump Darrell White was informed that some of the medications used in the devise may not be FDA approved for such use and therefore it constitutes an off-label use of the medications.  Finally, he was informed that Medicine is not an exact science; therefore, there is also the possibility of unforeseen or unpredictable risks and/or possible complications that may result in a catastrophic outcome. The patient indicated having understood very clearly. We have given the patient no guarantees and we have made no promises. Enough time was given to the patient to ask questions, all of which were answered to the patient's  satisfaction. Darrell White has indicated that he wanted to continue with the procedure. Attestation: I, the ordering provider, attest that I have discussed with the patient the benefits, risks, side-effects, alternatives, likelihood of achieving goals, and potential problems during recovery for the procedure that I have provided informed consent. Date  Time: 09/28/2019 11:27 AM  Pre-Procedure Preparation:  Monitoring: As per clinic protocol. Respiration, ETCO2, SpO2, BP, heart rate and rhythm monitor placed and checked for adequate function Safety Precautions: Patient was assessed for positional comfort and pressure points before starting the procedure. Time-out: I initiated and conducted the "Time-out" before starting the procedure, as per protocol. The patient was asked to participate by confirming the accuracy of the "Time Out" information. Verification of the correct person, site, and procedure were performed and confirmed by me, the nursing staff, and the patient. "Time-out" conducted as per Joint Commission's Universal Protocol (UP.01.01.01). Time: 1130  Description of Procedure:          Position: Supine Target Area: Central-port of intrathecal pump. Approach: Anterior, 90 degree angle approach. Area Prepped: Entire  Area around the pump implant. DuraPrep (Iodine Povacrylex [0.7% available iodine] and Isopropyl Alcohol, 74% w/w) Safety Precautions: Aspiration looking for blood return was conducted prior to all injections. At no point did we inject any substances, as a needle was being advanced. No attempts were made at seeking any paresthesias. Safe injection practices and needle disposal techniques used. Medications properly checked for expiration dates. SDV (single dose vial) medications used. Description of the Procedure: Protocol guidelines were followed. Two nurses trained to do implant refills were present during the entire procedure. The refill medication was checked by both healthcare  providers as well as the patient. The patient was included in the "Time-out" to verify the medication. The patient was placed in position. The pump was identified. The area was prepped in the usual manner. The sterile template was positioned over the pump, making sure the side-port location matched that of the pump. Both, the pump and the template were held for stability. The needle provided in the Medtronic Kit was then introduced thru the center of the template and into the central port. The pump content was aspirated and discarded volume documented. The new medication was slowly infused into the pump, thru the filter, making sure to avoid overpressure of the device. The needle was then removed and the area cleansed, making sure to leave some of the prepping solution back to take advantage of its long term bactericidal properties. The pump was interrogated and programmed to reflect the correct medication, volume, and dosage. The program was printed and taken to the physician for approval. Once checked and signed by the physician, a copy was provided to the patient and another scanned into the EMR. Vitals:   09/28/19 1126  BP: 106/63  Pulse: 66  Resp: 14  Temp: (!) 97.3 F (36.3 C)  TempSrc: Temporal  SpO2: 98%  Weight: 151 lb (68.5 kg)  Height: $Remove'5\' 8"'YDXxwVQ$  (1.727 m)    Start Time: 1150 hrs. End Time: 1159 hrs. Materials & Medications: Medtronic Refill Kit Medication(s): Please see chart orders for details.  Imaging Guidance:          Type of Imaging Technique: None used Indication(s): N/A Exposure Time: No patient exposure Contrast: None used. Fluoroscopic Guidance: N/A Ultrasound Guidance: N/A Interpretation: N/A  Antibiotic Prophylaxis:   Anti-infectives (From admission, onward)   None     Indication(s): None identified  Post-operative Assessment:  Post-procedure Vital Signs:  Pulse/HCG Rate: 66  Temp: (!) 97.3 F (36.3 C) Resp: 14 BP: 106/63 SpO2: 98 %  EBL:  None  Complications: No immediate post-treatment complications observed by team, or reported by patient.  Note: The patient tolerated the entire procedure well. A repeat set of vitals were taken after the procedure and the patient was kept under observation following institutional policy, for this type of procedure. Post-procedural neurological assessment was performed, showing return to baseline, prior to discharge. The patient was provided with post-procedure discharge instructions, including a section on how to identify potential problems. Should any problems arise concerning this procedure, the patient was given instructions to immediately contact us, at any time, without hesitation. In any case, we plan to contact the patient by telephone for a follow-up status report regarding this interventional procedure.  Comments:  No additional relevant information.  Plan of Care  Orders:  Orders Placed This Encounter  Procedures  . SHOULDER INJECTION    Standing Status:   Future    Standing Expiration Date:   11/08/2019    Scheduling Instructions:  Side: Left-sided     Sedation: Patient's choice.     Timeframe: ASAA    Order Specific Question:   Where will this procedure be performed?    Answer:   [facility]    Comments:   by Dr. Dossie Arbour  . SUPRASCAPULAR NERVE BLOCK    For shoulder pain.    Standing Status:   Future    Standing Expiration Date:   11/08/2019    Scheduling Instructions:     Purpose: Diagnostic     Laterality: Left-sided     Level(s): Suprascapular notch     Sedation: Patient's choice.     Scheduling Timeframe: ASAA    Order Specific Question:   Where will this procedure be performed?    Answer:   [facility]  . PUMP REFILL    Maintain Protocol by having two(2) healthcare providers during procedure and programming.    Scheduling Instructions:     Please refill intrathecal pump today.    Order Specific Question:   Where will this procedure be  performed?    Answer:   [facility]  . PUMP REFILL    Whenever possible schedule on a procedure today.    Standing Status:   Future    Standing Expiration Date:   02/25/2020    Scheduling Instructions:     Please schedule intrathecal pump refill based on pump programming. Avoid schedule intervals of more than 120 days (4 months).    Order Specific Question:   Where will this procedure be performed?    Answer:   [facility]  . Informed Consent Details: Physician/Practitioner Attestation; Transcribe to consent form and obtain patient signature    Provider Attestation: I, [place] Dossie Arbour, MD, (Pain Management Specialist), the physician/practitioner, attest that I have discussed with the patient the benefits, risks, side effects, alternatives, likelihood of achieving goals and potential problems during recovery for the procedure that I have provided informed consent.    Scheduling Instructions:     Procedure: Intrathecal Pump Refill     Attending Physician: Beatriz Chancellor A. Dossie Arbour, MD     Indications: Chronic Pain Syndrome (G89.4)     Transcribe to consent form and obtain patient signature.   Chronic Opioid Analgesic:  Oxycodone IR 10 mg, 1 tabl PO q 6 hrs (40 mg/day of oxycodone) MME/day: 60 mg/day.   Medications ordered for procedure: No orders of the defined types were placed in this encounter.  Medications administered: Darrell White had no medications administered during this visit.  See the medical record for exact dosing, route, and time of administration.  Follow-up plan:   Return for Procedure (w/ sedation): (L) Shoulder & SSNB #1, (Blood Thinner Protocol).       Interventional treatment options: Planned, scheduled, and/or pending:   Pending pump replacement by Dr. Deetta Perla   Under consideration:   Diagnostic left IA shoulder injection #1  Diagnostic bilateral intra-articular hip joint injection Therapeutic/palliative intrathecal pump  refill(s) Diagnostic bilateral lumbar facet block#1 Possible bilateral lumbar facet RFA Diagnostic caudal epidural steroid injection plus diagnostic epidurogram Possible Racz procedure Diagnostic left cervical epidural steroid injection Diagnosticleft cervical facet block Possible left cervical facet RFA   Therapeutic/palliative (PRN):   Diagnostic bilateral lumbar facet block#1under fluoroscopic guidance and IV sedation Therapeutic/palliative intrathecal pump refill(s)    Recent Visits Date Type Provider Dept  09/28/19 Procedure visit Milinda Pointer, MD [facility]  Showing recent visits within past 90 days and meeting all other requirements Future Appointments  Date Type Provider Dept  12/23/19 Appointment Milinda Pointer, MD [facility]  Showing future appointments within next 90 days and meeting all other requirements  Disposition: Discharge home  Discharge (Date  Time): 09/28/2019; 1159 hrs.   Primary Care Physician: [name], MD Location: [facility] Note by: Gaspar Cola, MD Date: 09/28/2019; Time: 8:01 AM  Disclaimer:  Medicine is not an Chief Strategy Officer. The only guarantee in medicine is that nothing is guaranteed. It is important to note that the decision to proceed with this intervention was based on the information collected from the patient. The Data and conclusions were drawn from the patient's questionnaire, the interview, and the physical examination. Because the information was provided in large part by the patient, it cannot be guaranteed that it has not been purposely or unconsciously manipulated. Every effort has been made to obtain as much relevant data as possible for this evaluation. It is important to note that the conclusions that lead to this procedure are derived in large part from the available data. Always take into account that the treatment will also be dependent on availability  of resources and existing treatment guidelines, considered by other Pain Management Practitioners as being common knowledge and practice, at the time of the intervention. For Medico-Legal purposes, it is also important to point out that variation in procedural techniques and pharmacological choices are the acceptable norm. The indications, contraindications, technique, and results of the above procedure should only be interpreted and judged by a Board-Certified Interventional Pain Specialist with extensive familiarity and expertise in the same exact procedure and technique.

## 2019-09-28 NOTE — Progress Notes (Signed)
Nursing Pain Medication Assessment:  Safety precautions to be maintained throughout the outpatient stay will include: orient to surroundings, keep bed in low position, maintain call bell within reach at all times, provide assistance with transfer out of bed and ambulation.  Medication Inspection Compliance: Pill count conducted under aseptic conditions, in front of the patient. Neither the pills nor the bottle was removed from the patient's sight at any time. Once count was completed pills were immediately returned to the patient in their original bottle.  Medication: Oxycodone IR Pill/Patch Count: 30 of 120 pills remain Pill/Patch Appearance: Markings consistent with prescribed medication Bottle Appearance: Standard pharmacy container. Clearly labeled. Filled Date:09/02/2019 Last Medication intake:  Today

## 2019-09-29 ENCOUNTER — Telehealth: Payer: Self-pay | Admitting: *Deleted

## 2019-09-29 NOTE — Telephone Encounter (Signed)
No problems post pump fill.

## 2019-10-04 ENCOUNTER — Telehealth: Payer: Self-pay | Admitting: Pain Medicine

## 2019-10-04 ENCOUNTER — Other Ambulatory Visit: Payer: Self-pay | Admitting: Student in an Organized Health Care Education/Training Program

## 2019-10-04 DIAGNOSIS — G893 Neoplasm related pain (acute) (chronic): Secondary | ICD-10-CM

## 2019-10-04 DIAGNOSIS — G894 Chronic pain syndrome: Secondary | ICD-10-CM

## 2019-10-04 MED ORDER — OXYCODONE HCL 10 MG PO TABS: 10.0000 mg | ORAL_TABLET | Freq: Four times a day (QID) | ORAL | 0 refills | Status: DC | PRN

## 2019-10-04 NOTE — Telephone Encounter (Signed)
He called back to see if this had been done

## 2019-10-04 NOTE — Telephone Encounter (Signed)
Patient states meds were supposed to be changed and sent to pharmacy but pharmacy has not received any scripts for different medication. Please check on this and let patient know status

## 2019-10-04 NOTE — Telephone Encounter (Signed)
Spoke with patient, he states that he and Dr Dossie Arbour did discuss medication management and because of oxycodone being short he was going to send in [place] [facility].  HOwever this was not done.  Mr Lowden states he will be out of medication in a couple of days.  I told him I was going to check to see if his pharmacy has oxycodone.  [facility] and they do have oxycodone in stock.  I told him I would see if Dr Holley Raring will please send in the medication.

## 2019-10-04 NOTE — Progress Notes (Unsigned)
See nursing notes associated with this encounter.  Refill of oxycodone.  Follow-up in 3 months with Dr. Dossie Arbour.  PMP reviewed and appropriate.  09/02/2019  2   06/24/2019  Oxycodone Hcl 10 MG Tablet  120.00  30 Fr Nav   [phone]   Car ([id number])   0/0  60.00 MME  Comm Ins   [place]

## 2019-10-13 ENCOUNTER — Telehealth: Payer: Self-pay

## 2019-10-13 NOTE — Telephone Encounter (Signed)
Patient notified to stop Wliquis 3 days prior to procedure on 12-23-2019.  Patient states understanding.

## 2019-11-01 ENCOUNTER — Other Ambulatory Visit: Payer: Self-pay

## 2019-11-01 ENCOUNTER — Telehealth: Payer: Self-pay | Admitting: Pain Medicine

## 2019-11-01 ENCOUNTER — Ambulatory Visit
Payer: PPO | Attending: Student in an Organized Health Care Education/Training Program | Admitting: Student in an Organized Health Care Education/Training Program

## 2019-11-01 ENCOUNTER — Encounter: Payer: Self-pay | Admitting: Student in an Organized Health Care Education/Training Program

## 2019-11-01 DIAGNOSIS — G8929 Other chronic pain: Secondary | ICD-10-CM

## 2019-11-01 DIAGNOSIS — G893 Neoplasm related pain (acute) (chronic): Secondary | ICD-10-CM

## 2019-11-01 DIAGNOSIS — M5441 Lumbago with sciatica, right side: Secondary | ICD-10-CM | POA: Diagnosis not present

## 2019-11-01 DIAGNOSIS — Z95828 Presence of other vascular implants and grafts: Secondary | ICD-10-CM

## 2019-11-01 DIAGNOSIS — M25512 Pain in left shoulder: Secondary | ICD-10-CM | POA: Diagnosis not present

## 2019-11-01 DIAGNOSIS — M5442 Lumbago with sciatica, left side: Secondary | ICD-10-CM | POA: Diagnosis not present

## 2019-11-01 DIAGNOSIS — G894 Chronic pain syndrome: Secondary | ICD-10-CM | POA: Diagnosis not present

## 2019-11-01 DIAGNOSIS — M19012 Primary osteoarthritis, left shoulder: Secondary | ICD-10-CM

## 2019-11-01 DIAGNOSIS — M961 Postlaminectomy syndrome, not elsewhere classified: Secondary | ICD-10-CM | POA: Diagnosis not present

## 2019-11-01 MED ORDER — PREDNISONE 20 MG PO TABS: ORAL_TABLET | ORAL | 0 refills | Status: AC

## 2019-11-01 MED ORDER — METHOCARBAMOL 500 MG PO TABS: 500.0000 mg | ORAL_TABLET | Freq: Three times a day (TID) | ORAL | 1 refills | Status: DC | PRN

## 2019-11-01 NOTE — Telephone Encounter (Signed)
Juliann Pulse will call him to schedule a VV

## 2019-11-01 NOTE — Telephone Encounter (Signed)
Pt called and stated that his back is "totally out" and requests that Dr Delane Ginger send him in an rx of muscle relaxers and steroids. Pt states he would like for a nurse to call him back.

## 2019-11-01 NOTE — Progress Notes (Signed)
Patient: Darrell White  Service Category: E/M  Provider: Gillis Santa, MD  DOB: August 28, 1950  DOS: 11/01/2019  Location: Office  MRN: [id number]  Setting: Ambulatory outpatient  Referring Provider: [name], MD  Type: Established Patient  Specialty: Interventional Pain Management  PCP: [name], MD  Location: Home  Delivery: TeleHealth     Virtual Encounter - Pain Management PROVIDER NOTE: Information contained herein reflects review and annotations entered in association with encounter. Interpretation of such information and data should be left to medically-trained personnel. Information provided to patient can be located elsewhere in the medical record under "Patient Instructions". Document created using STT-dictation technology, any transcriptional errors that may result from process are unintentional.    Contact & Pharmacy Preferred: [phone] Home: [phone] (home) Mobile: [phone] (mobile) E-mail: No e-mail address on record  [facility] [place] [facility] [address] [place] [address] Phone: [phone] Fax: [phone]   Pre-screening  Mr. Ricard Dillon offered "in-person" vs "virtual" encounter. He indicated preferring virtual for this encounter.   Reason COVID-19*  Social distancing based on CDC and AMA recommendations.   I contacted Sabas Sous on 11/01/2019 via video conference.      I clearly identified myself as Gillis Santa, MD. I verified that I was speaking with the correct person using two identifiers (Name: HINTON LUELLEN, and date of birth: 03-Jul-1950).  Consent I sought verbal advanced consent from Sabas Sous for virtual visit interactions. I informed Mr. Wildes of possible security and privacy concerns, risks, and limitations associated with providing "not-in-person" medical evaluation and management services. I also informed Mr. Smestad of the availability of "in-person" appointments. Finally, I informed him that there  would be a charge for the virtual visit and that he could be  personally, fully or partially, financially responsible for it. Mr. Caliendo expressed understanding and agreed to proceed.   Historic Elements   Mr. ERIC NEES is a 69 y.o. year old, male patient evaluated today after our last contact on Visit date not found. Mr. 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 He also  has a past surgical history that includes Mouth surgery; Cervical disc surgery; Shoulder arthroscopy; Back surgery; Spinal fusion; Spine surgery; Knee arthroscopy; Cataract extraction w/PHACO (Left, 04/09/2012); Direct laryngoscopy (N/A, 03/26/2016); Excision of tongue lesion (Right, 03/26/2016); OTHER SURGICAL HISTORY; CT LUNG SCREENING (06/16/2017); Direct laryngoscopy (N/A, 05/31/2019); and Intrathecal pump implant (Right, 08/02/2019). Mr. Remer has a current medication list which includes the following prescription(s): amiodarone, atorvastatin, diclofenac sodium, gabapentin, levothyroxine, metoprolol succinate, mirtazapine, oxycodone hcl, [START ON 11/03/2019] oxycodone hcl, [START ON 12/03/2019] oxycodone hcl, sotalol, tiotropium, trazodone, albuterol, methocarbamol, and prednisone. He  reports that he has been smoking cigarettes. He has a 110.00 pack-year smoking history. He has never used smokeless tobacco. He reports that he does not drink alcohol and does not use drugs. Mr. Pinard is allergic to nsaids.   HPI  Today, he is being contacted for worsening of previously known (established) problem  Patient is complaining of increased low back pain and upper buttock pain.  No bowel or bladder dysfunction.  No inciting or traumatic event.  He states that 2-3 times a  year, he has a severe back pain flare like he is having right now.  He states  that Robaxin and prednisone taper are usually helpful for the symptoms.  Continue intrathecal pump management with Dr. Dossie Arbour and follow-up with him as scheduled.  Please see orders below.  UDS:  Summary  Date Value Ref Range Status  03/26/2018 FINAL  Final    Comment:    ==================================================================== TOXASSURE SELECT 13 (MW) ==================================================================== Test                             Result       Flag       Units Drug Present and Declared for Prescription Verification   Oxycodone                      1772         EXPECTED   ng/mg creat   Oxymorphone                    >4115        EXPECTED   ng/mg creat   Noroxycodone                   >4115        EXPECTED   ng/mg creat   Noroxymorphone                 1803         EXPECTED   ng/mg creat    Sources of oxycodone are scheduled prescription medications.    Oxymorphone, noroxycodone, and noroxymorphone are expected    metabolites of oxycodone. Oxymorphone is also available as a    scheduled prescription medication. Drug Present not Declared for Prescription Verification   Morphine                       1085         UNEXPECTED ng/mg creat    Potential sources of large amounts of morphine in the absence of    codeine include administration of morphine or use of heroin. ==================================================================== Test                      Result    Flag   Units      Ref Range   Creatinine              243              mg/dL      >=20 ==================================================================== Declared Medications:  The flagging and interpretation on this report are based on the  following declared medications.  Unexpected results may arise from  inaccuracies in the declared medications.  **Note: The testing scope of this panel includes these  medications:  Oxycodone  **Note: The testing scope of this panel does not include following  reported medications:  Acetaminophen (Tylenol)  Atorvastatin (Lipitor)  Docusate  Gabapentin (Neurontin)  Levothyroxine  Mirtazapine  Omeprazole  Sotalol  Tiotropium  Trazodone ==================================================================== For clinical consultation, please call [phone]. ====================================================================     Laboratory Chemistry Profile   Renal Lab Results  Component Value Date   BUN 14 08/02/2019   CREATININE 0.90 08/02/2019   BCR 11 10/16/2016   GFRAA >60 08/02/2019   GFRNONAA >60 08/02/2019     Hepatic Lab Results  Component Value Date   AST 18 10/30/2018   ALT 13 10/30/2018   ALBUMIN 4.0 10/30/2018   ALKPHOS 41 10/30/2018     Electrolytes  Lab Results  Component Value Date   NA 138 08/02/2019   K 4.2 08/02/2019   CL 103 08/02/2019   CALCIUM 8.8 (L) 08/02/2019   MG 2.0 10/16/2016     Bone Lab Results  Component Value Date   25OHVITD1 47 10/16/2016   25OHVITD2 <1.0 10/16/2016   25OHVITD3 47 10/16/2016     Inflammation (CRP: Acute Phase) (ESR: Chronic Phase) Lab Results  Component Value Date   CRP 1.0 10/16/2016   ESRSEDRATE 3 10/16/2016       Note: Above Lab results reviewed.  Imaging  CT SOFT TISSUE NECK W CONTRAST CLINICAL DATA:  Chronic throat pain. 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  EXAM: CT NECK WITH CONTRAST  TECHNIQUE: Multidetector CT imaging of the neck was performed using the standard protocol following the bolus administration of intravenous contrast.  CONTRAST:  70m OMNIPAQUE IOHEXOL 300 MG/ML  SOLN  COMPARISON:  Chest CT 10/30/2018, neck CT 04/09/2016,  nuclear medicine PET scans 11/14/2016 and 06/16/2017  FINDINGS: Pharynx and larynx: There is mild asymmetric linear and subtly nodular enhancement along the mucosal surface of the base of tongue and vallecular just right of midline, also subtly involving the lingual surface of the epiglottis (series 5, images 51-53). A nodular mucosal focus of enhancement just to the right of midline measures 4 mm (series 2, image 52). Post radiation changes to the tongue base, pharynx and supraglottic larynx and surrounding neck.  Salivary glands: The parotid glands are unremarkable. The submandibular glands are atrophic.  Thyroid: Unremarkable.  Lymph nodes: No pathologically enlarged or suspicious cervical chain lymph nodes.  Vascular: Again demonstrated atherosclerotic disease within the visualized aortic arch and major branch vessels of the neck. This includes mixed plaque at the carotid bifurcations and within the proximal internal carotid arteries. A stenosis of the proximal left ICA may be hemodynamically significant.  Limited intracranial: Atherosclerotic calcifications of the internal carotid artery siphons. Otherwise unremarkable.  Visualized orbits: Incompletely imaged visualized orbits demonstrate no acute abnormality.  Mastoids and visualized paranasal sinuses: Mild ethmoid sinus mucosal thickening at the imaged levels. No significant mastoid effusion.  Skeleton: No acute bony abnormality or aggressive osseous lesion. Prior C5-C7 ACDF.  Upper chest: Post radiation changes to the right lung apex appears similar to prior chest CT 10/30/2018. A right upper lobe pulmonary nodule has continued to increase in size since 10/30/2018, now measuring 1.7 cm (previously 1.3 cm) (series 6, image 133). An adjacent satellite nodule has also increased in size, now measuring 0.8 cm (previously 0.6 cm).  These results will be called to the ordering clinician or representative by the  Radiologist Assistant, and communication documented in the PACS or [employer]  IMPRESSION: Mild asymmetric enhancement along the mucosal surface of the tongue base and vallecula just right of midline, also slightly extending along the lingual surface of the epiglottis. A subtly nodular focus of enhancement just right of midline along the tongue base measures 4 mm. Findings are nonspecific and may reflect treatment related changes. Early recurrence of squamous cell carcinoma cannot be excluded. Correlate with direct visualization and consider direct tissue sampling as clinically warranted.  No pathologically enlarged or suspicious cervical chain lymph nodes.  Continued interval progression of a right upper lobe pulmonary nodule since CT chest 10/30/2018, now measuring 1.7 cm. An adjacent 0.8 cm satellite  nodule has also progressed.  Atherosclerotic disease with possible hemodynamically significant stenosis of the proximal left internal carotid artery. Consider carotid artery duplex for further evaluation.  Electronically Signed   By: Kellie Simmering DO   On: 05/03/2019 09:45  Assessment  The primary encounter diagnosis was Failed back surgical syndrome. Diagnoses of Chronic shoulder pain (Left), Osteoarthritis of shoulder (Left), Chronic pain syndrome, Cancer-related pain, Presence of implanted infusion pump, and Chronic low back pain (Primary Area of Pain) (Bilateral) (R>L) were also pertinent to this visit.  Plan of Care   Mr. BION TODOROV has a current medication list which includes the following long-term medication(s): albuterol, amiodarone, atorvastatin, gabapentin, levothyroxine, mirtazapine, sotalol, tiotropium, oxycodone hcl, [START ON 11/03/2019] oxycodone hcl, and [START ON 12/03/2019] oxycodone hcl.  Pharmacotherapy (Medications Ordered): Meds ordered this encounter  Medications  . predniSONE (DELTASONE) 20 MG tablet    Sig: Take 3 tablets (60 mg total) by mouth  daily with breakfast for 3 days, THEN 2 tablets (40 mg total) daily with breakfast for 3 days, THEN 1 tablet (20 mg total) daily with breakfast for 3 days.    Dispense:  18 tablet    Refill:  0  . methocarbamol (ROBAXIN) 500 MG tablet    Sig: Take 1 tablet (500 mg total) by mouth every 8 (eight) hours as needed for muscle spasms.    Dispense:  90 tablet    Refill:  1    Do not place this medication, or any other prescription from our practice, on "Automatic Refill". Patient may have prescription filled one day early if pharmacy is closed on scheduled refill date.   Follow-up plan:   Return for Keep sch. appt.   Recent Visits Date Type Provider Dept  09/28/19 Procedure visit Milinda Pointer, MD [facility]  Showing recent visits within past 90 days and meeting all other requirements Today's Visits Date Type Provider Dept  11/01/19 Telemedicine Gillis Santa, MD [facility]  Showing today's visits and meeting all other requirements Future Appointments Date Type Provider Dept  12/23/19 Appointment Milinda Pointer, MD [facility]  Showing future appointments within next 90 days and meeting all other requirements  I discussed the assessment and treatment plan with the patient. The patient was provided an opportunity to ask questions and all were answered. The patient agreed with the plan and demonstrated an understanding of the instructions.  Patient advised to call back or seek an in-person evaluation if the symptoms or condition worsens.  Duration of encounter: 15 minutes.  Note by: Gillis Santa, MD Date: 11/01/2019; Time: 3:31 PM

## 2019-11-01 NOTE — Telephone Encounter (Signed)
Spoke with patient, having exacerbation of lower back pain. I informed him that no meds are called in outside of an appt. Secretary looking at the schedule to see if any VV openings this week.

## 2019-11-04 DIAGNOSIS — M1612 Unilateral primary osteoarthritis, left hip: Secondary | ICD-10-CM | POA: Diagnosis not present

## 2019-11-04 DIAGNOSIS — I10 Essential (primary) hypertension: Secondary | ICD-10-CM | POA: Diagnosis not present

## 2019-11-04 DIAGNOSIS — E7849 Other hyperlipidemia: Secondary | ICD-10-CM | POA: Diagnosis not present

## 2019-11-04 DIAGNOSIS — E039 Hypothyroidism, unspecified: Secondary | ICD-10-CM | POA: Diagnosis not present

## 2019-11-22 ENCOUNTER — Other Ambulatory Visit: Payer: Self-pay

## 2019-11-22 MED ORDER — PAIN MANAGEMENT IT PUMP REFILL: 1.0000 | Freq: Once | INTRATHECAL | 0 refills | Status: AC

## 2019-11-25 DIAGNOSIS — I10 Essential (primary) hypertension: Secondary | ICD-10-CM | POA: Diagnosis not present

## 2019-11-25 DIAGNOSIS — F17200 Nicotine dependence, unspecified, uncomplicated: Secondary | ICD-10-CM | POA: Diagnosis not present

## 2019-11-25 DIAGNOSIS — I2510 Atherosclerotic heart disease of native coronary artery without angina pectoris: Secondary | ICD-10-CM | POA: Diagnosis not present

## 2019-11-25 DIAGNOSIS — E782 Mixed hyperlipidemia: Secondary | ICD-10-CM | POA: Diagnosis not present

## 2019-11-25 DIAGNOSIS — I4891 Unspecified atrial fibrillation: Secondary | ICD-10-CM | POA: Diagnosis not present

## 2019-12-04 DIAGNOSIS — M1612 Unilateral primary osteoarthritis, left hip: Secondary | ICD-10-CM | POA: Diagnosis not present

## 2019-12-04 DIAGNOSIS — I10 Essential (primary) hypertension: Secondary | ICD-10-CM | POA: Diagnosis not present

## 2019-12-04 DIAGNOSIS — E039 Hypothyroidism, unspecified: Secondary | ICD-10-CM | POA: Diagnosis not present

## 2019-12-04 DIAGNOSIS — E7849 Other hyperlipidemia: Secondary | ICD-10-CM | POA: Diagnosis not present

## 2019-12-15 DIAGNOSIS — M1612 Unilateral primary osteoarthritis, left hip: Secondary | ICD-10-CM | POA: Diagnosis not present

## 2019-12-15 DIAGNOSIS — F331 Major depressive disorder, recurrent, moderate: Secondary | ICD-10-CM | POA: Diagnosis not present

## 2019-12-15 DIAGNOSIS — Z1212 Encounter for screening for malignant neoplasm of rectum: Secondary | ICD-10-CM | POA: Diagnosis not present

## 2019-12-15 DIAGNOSIS — M19031 Primary osteoarthritis, right wrist: Secondary | ICD-10-CM | POA: Diagnosis not present

## 2019-12-15 DIAGNOSIS — M545 Low back pain, unspecified: Secondary | ICD-10-CM | POA: Diagnosis not present

## 2019-12-15 DIAGNOSIS — E782 Mixed hyperlipidemia: Secondary | ICD-10-CM | POA: Diagnosis not present

## 2019-12-15 DIAGNOSIS — M19041 Primary osteoarthritis, right hand: Secondary | ICD-10-CM | POA: Diagnosis not present

## 2019-12-15 DIAGNOSIS — E039 Hypothyroidism, unspecified: Secondary | ICD-10-CM | POA: Diagnosis not present

## 2019-12-15 DIAGNOSIS — C3491 Malignant neoplasm of unspecified part of right bronchus or lung: Secondary | ICD-10-CM | POA: Diagnosis not present

## 2019-12-15 DIAGNOSIS — I4892 Unspecified atrial flutter: Secondary | ICD-10-CM | POA: Diagnosis not present

## 2019-12-15 DIAGNOSIS — Z23 Encounter for immunization: Secondary | ICD-10-CM | POA: Diagnosis not present

## 2019-12-15 DIAGNOSIS — Z6824 Body mass index (BMI) 24.0-24.9, adult: Secondary | ICD-10-CM | POA: Diagnosis not present

## 2019-12-15 DIAGNOSIS — Z0001 Encounter for general adult medical examination with abnormal findings: Secondary | ICD-10-CM | POA: Diagnosis not present

## 2019-12-15 DIAGNOSIS — K219 Gastro-esophageal reflux disease without esophagitis: Secondary | ICD-10-CM | POA: Diagnosis not present

## 2019-12-15 DIAGNOSIS — F17210 Nicotine dependence, cigarettes, uncomplicated: Secondary | ICD-10-CM | POA: Diagnosis not present

## 2019-12-23 ENCOUNTER — Ambulatory Visit: Admission: RE | Admit: 2019-12-23 | Payer: PPO | Source: Ambulatory Visit

## 2019-12-23 ENCOUNTER — Other Ambulatory Visit: Payer: Self-pay

## 2019-12-23 ENCOUNTER — Ambulatory Visit
Admission: RE | Admit: 2019-12-23 | Discharge: 2019-12-23 | Disposition: A | Discharge status: Home / Self Care | Payer: PPO | Source: Ambulatory Visit | Attending: Pain Medicine | Admitting: Pain Medicine

## 2019-12-23 ENCOUNTER — Encounter: Payer: Self-pay | Admitting: Pain Medicine

## 2019-12-23 ENCOUNTER — Ambulatory Visit (HOSPITAL_BASED_OUTPATIENT_CLINIC_OR_DEPARTMENT_OTHER): Payer: PPO | Admitting: Pain Medicine

## 2019-12-23 VITALS — BP 137/88 | Temp 97.3°F | Resp 13 | Ht 68.0 in | Wt 151.0 lb

## 2019-12-23 DIAGNOSIS — M19011 Primary osteoarthritis, right shoulder: Secondary | ICD-10-CM | POA: Insufficient documentation

## 2019-12-23 DIAGNOSIS — G8929 Other chronic pain: Secondary | ICD-10-CM

## 2019-12-23 DIAGNOSIS — Z9689 Presence of other specified functional implants: Secondary | ICD-10-CM | POA: Diagnosis not present

## 2019-12-23 DIAGNOSIS — F1120 Opioid dependence, uncomplicated: Secondary | ICD-10-CM | POA: Insufficient documentation

## 2019-12-23 DIAGNOSIS — Z7901 Long term (current) use of anticoagulants: Secondary | ICD-10-CM

## 2019-12-23 DIAGNOSIS — M7551 Bursitis of right shoulder: Secondary | ICD-10-CM | POA: Insufficient documentation

## 2019-12-23 DIAGNOSIS — M25511 Pain in right shoulder: Secondary | ICD-10-CM | POA: Diagnosis not present

## 2019-12-23 DIAGNOSIS — Z886 Allergy status to analgesic agent status: Secondary | ICD-10-CM | POA: Diagnosis not present

## 2019-12-23 DIAGNOSIS — Z95828 Presence of other vascular implants and grafts: Secondary | ICD-10-CM

## 2019-12-23 DIAGNOSIS — G893 Neoplasm related pain (acute) (chronic): Secondary | ICD-10-CM | POA: Insufficient documentation

## 2019-12-23 DIAGNOSIS — M961 Postlaminectomy syndrome, not elsewhere classified: Secondary | ICD-10-CM

## 2019-12-23 DIAGNOSIS — Z981 Arthrodesis status: Secondary | ICD-10-CM | POA: Insufficient documentation

## 2019-12-23 DIAGNOSIS — Z451 Encounter for adjustment and management of infusion pump: Secondary | ICD-10-CM

## 2019-12-23 DIAGNOSIS — G894 Chronic pain syndrome: Secondary | ICD-10-CM | POA: Diagnosis not present

## 2019-12-23 DIAGNOSIS — Z79899 Other long term (current) drug therapy: Secondary | ICD-10-CM

## 2019-12-23 MED ORDER — ROPIVACAINE HCL 2 MG/ML IJ SOLN
9.0000 mL | Freq: Once | INTRAMUSCULAR | Status: AC
  Administered 2019-12-23: 9 mL via INTRA_ARTICULAR
  Filled 2019-12-23: qty 10

## 2019-12-23 MED ORDER — OXYCODONE HCL 10 MG PO TABS: 10.0000 mg | ORAL_TABLET | Freq: Four times a day (QID) | ORAL | 0 refills | Status: DC | PRN

## 2019-12-23 MED ORDER — METHOCARBAMOL 500 MG PO TABS: 500.0000 mg | ORAL_TABLET | Freq: Three times a day (TID) | ORAL | 2 refills | Status: DC | PRN

## 2019-12-23 MED ORDER — MIDAZOLAM HCL 5 MG/5ML IJ SOLN
1.0000 mg | INTRAMUSCULAR | Status: DC | PRN
Start: 2019-12-23 — End: 2019-12-23

## 2019-12-23 MED ORDER — METHYLPREDNISOLONE ACETATE 80 MG/ML IJ SUSP
80.0000 mg | Freq: Once | INTRAMUSCULAR | Status: AC
  Administered 2019-12-23: 80 mg via INTRA_ARTICULAR
  Filled 2019-12-23: qty 1

## 2019-12-23 MED ORDER — FENTANYL CITRATE (PF) 100 MCG/2ML IJ SOLN
25.0000 ug | INTRAMUSCULAR | Status: DC | PRN
Start: 2019-12-23 — End: 2019-12-23

## 2019-12-23 MED ORDER — LIDOCAINE HCL 2 % IJ SOLN
20.0000 mL | Freq: Once | INTRAMUSCULAR | Status: AC
  Administered 2019-12-23: 400 mg
  Filled 2019-12-23: qty 20

## 2019-12-23 NOTE — Patient Instructions (Signed)

## 2019-12-23 NOTE — Progress Notes (Signed)
PROVIDER NOTE: Information contained herein reflects review and annotations entered in association with encounter. Interpretation of such information and data should be left to medically-trained personnel. Information provided to patient can be located elsewhere in the medical record under "Patient Instructions". Document created using STT-dictation technology, any transcriptional errors that may result from process are unintentional.    Patient: Darrell White  Service Category: Procedure  Provider: Gaspar Cola, MD  DOB: 1951/02/01  DOS: 12/23/2019  Location: [facility]  MRN: [id number]  Setting: Ambulatory - outpatient  Referring Provider: [name], MD  Type: Established Patient  Specialty: Interventional Pain Management  PCP: [name], MD   Primary Reason for Visit: Interventional Pain Management Treatment. CC: Shoulder Pain (right)  Procedure #1:  Anesthesia, Analgesia, Anxiolysis:  Type: Diagnostic Glenohumeral and acromioclavicular joint Injection #1  Primary Purpose: Diagnostic Region: Superior Shoulder Area Level:  Shoulder Target Area: Glenohumeral and acromioclavicular joint Approach: Anterior approach. Laterality: Left  Type: Local Anesthesia Indication(s): Analgesia         Route: Infiltration (Ovid/IM) IV Access: Declined Sedation: Declined  Local Anesthetic: Lidocaine 1-2%  Position: Supine   Indications: 1. Chronic shoulder pain (Right)   2. Osteoarthritis of shoulder (Right)    Procedure #2:  Intrathecal Drug Delivery System (IDDS):  Type: Reservoir Refill [id number]) 20% rate increase Region: Abdominal Laterality: Right  Type of Pump: Medtronic Synchromed II Delivery Route: Intrathecal Type of Pain Treated: Neuropathic/Nociceptive Primary Medication Class: Opioid/opiate  Medication, Concentration, Infusion Program, & Delivery Rate: Please see scanned programming printout.   Indications: 1. Chronic pain syndrome   2. Failed back  surgical syndrome   3. Cancer-related pain   4. Presence of implanted infusion pump   5. Adjustment and management of infusion pump   6. Pharmacologic therapy   7. Uncomplicated opioid dependence (HCC)    Pain Score: Pre-procedure: 10-Worst pain ever/10 Post-procedure: 4 /10   Pre-op H&P Assessment:  Darrell White is a 69 y.o. (year old), male patient, seen today for interventional treatment. He  has a past surgical history that includes Mouth surgery; Cervical disc surgery; Shoulder arthroscopy; Back surgery; Spinal fusion; Spine surgery; Knee arthroscopy; Cataract extraction w/PHACO (Left, 04/09/2012); Direct laryngoscopy (N/A, 03/26/2016); Excision of tongue lesion (Right, 03/26/2016); OTHER SURGICAL HISTORY; CT LUNG SCREENING (06/16/2017); Direct laryngoscopy (N/A, 05/31/2019); and Intrathecal pump implant (Right, 08/02/2019). Darrell White has a current medication list which includes the following prescription(s): amiodarone, atorvastatin, diclofenac sodium, gabapentin, levothyroxine, methocarbamol, metoprolol succinate, mirtazapine, [START ON 01/02/2020] oxycodone hcl, [START ON 02/01/2020] oxycodone hcl, [START ON 03/02/2020] oxycodone hcl, sotalol, tiotropium, and trazodone. His primarily concern today is the Shoulder Pain (right)  Initial Vital Signs:  Pulse/HCG Rate:  ECG Heart Rate: 73 Temp: (!) 97.3 F (36.3 C) Resp: 16 BP: 130/76 SpO2: 99 %  BMI: Estimated body mass index is 22.96 kg/m as calculated from the following:   Height as of this encounter: _0  (1.727 m).   Weight as of this encounter: 151 lb (68.5 kg).  Risk Assessment: Allergies: Reviewed. He is allergic to nsaids.  Allergy Precautions: None required Coagulopathies: Reviewed. None identified.  Blood-thinner therapy: None at this time Active Infection(s): Reviewed. None identified. Darrell White is afebrile  Site Confirmation: Darrell White was asked to confirm the procedure and laterality before marking the site Procedure  checklist: Completed Consent: Before the procedure and under the influence of no sedative(s), amnesic(s), or anxiolytics, the patient was informed of the treatment options, risks and possible complications. To fulfill  our ethical and legal obligations, as recommended by the American Medical Association's Code of Ethics, I have informed the patient of my clinical impression; the nature and purpose of the treatment or procedure; the risks, benefits, and possible complications of the intervention; the alternatives, including doing nothing; the risk(s) and benefit(s) of the alternative treatment(s) or procedure(s); and the risk(s) and benefit(s) of doing nothing. The patient was provided information about the general risks and possible complications associated with the procedure. These may include, but are not limited to: failure to achieve desired goals, infection, bleeding, organ or nerve damage, allergic reactions, paralysis, and death. In addition, the patient was informed of those risks and complications associated to the procedure, such as failure to decrease pain; infection; bleeding; organ or nerve damage with subsequent damage to sensory, motor, and/or autonomic systems, resulting in permanent pain, numbness, and/or weakness of one or several areas of the body; allergic reactions; (i.e.: anaphylactic reaction); and/or death. Furthermore, the patient was informed of those risks and complications associated with the medications. These include, but are not limited to: allergic reactions (i.e.: anaphylactic or anaphylactoid reaction(s)); adrenal axis suppression; blood sugar elevation that in diabetics may result in ketoacidosis or comma; water retention that in patients with history of congestive heart failure may result in shortness of breath, pulmonary edema, and decompensation with resultant heart failure; weight gain; swelling or edema; medication-induced neural toxicity; particulate matter embolism and  blood vessel occlusion with resultant organ, and/or nervous system infarction; and/or aseptic necrosis of one or more joints. Finally, the patient was informed that Medicine is not an exact science; therefore, there is also the possibility of unforeseen or unpredictable risks and/or possible complications that may result in a catastrophic outcome. The patient indicated having understood very clearly. We have given the patient no guarantees and we have made no promises. Enough time was given to the patient to ask questions, all of which were answered to the patient's satisfaction. Mr. Handlin has indicated that he wanted to continue with the procedure. Attestation: I, the ordering provider, attest that I have discussed with the patient the benefits, risks, side-effects, alternatives, likelihood of achieving goals, and potential problems during recovery for the procedure that I have provided informed consent. Date  Time: 12/23/2019 11:02 AM  Pharmacotherapy Assessment  Analgesic: Oxycodone IR 10 mg, 1 tabl PO q 6 hrs (40 mg/day of oxycodone) MME/day: 60 mg/day.   Monitoring: [place] PMP: PDMP reviewed during this encounter.       Pharmacotherapy: No side-effects or adverse reactions reported. Compliance: No problems identified. Effectiveness: Clinically acceptable. Plan: Refer to "POC".  UDS:  Summary  Date Value Ref Range Status  03/26/2018 FINAL  Final    Comment:    ==================================================================== TOXASSURE SELECT 13 (MW) ==================================================================== Test                             Result       Flag       Units Drug Present and Declared for Prescription Verification   Oxycodone                      1772         EXPECTED   ng/mg creat   Oxymorphone                    >4115        EXPECTED   ng/mg creat   Noroxycodone                   >  4115        EXPECTED   ng/mg creat   Noroxymorphone                 1803          EXPECTED   ng/mg creat    Sources of oxycodone are scheduled prescription medications.    Oxymorphone, noroxycodone, and noroxymorphone are expected    metabolites of oxycodone. Oxymorphone is also available as a    scheduled prescription medication. Drug Present not Declared for Prescription Verification   Morphine                       1085         UNEXPECTED ng/mg creat    Potential sources of large amounts of morphine in the absence of    codeine include administration of morphine or use of heroin. ==================================================================== Test                      Result    Flag   Units      Ref Range   Creatinine              243              mg/dL      >=20 ==================================================================== Declared Medications:  The flagging and interpretation on this report are based on the  following declared medications.  Unexpected results may arise from  inaccuracies in the declared medications.  **Note: The testing scope of this panel includes these medications:  Oxycodone  **Note: The testing scope of this panel does not include following  reported medications:  Acetaminophen (Tylenol)  Atorvastatin (Lipitor)  Docusate  Gabapentin (Neurontin)  Levothyroxine  Mirtazapine  Omeprazole  Sotalol  Tiotropium  Trazodone ==================================================================== For clinical consultation, please call [phone]. ====================================================================     Intrathecal Pump Therapy Assessment  Manufacturer: Medtronic Synchromed Type: Programmable Volume: 40 mL reservoir MRI compatibility: Yes   Drug content:  Primary Medication Class:Opioid Primary Medication:PF-Morphine(20 mg/mL) Secondary Medication:PF-Bupivacaine(20 mg/mL) Other Medication:No third medication   Programming:  Type: Simple continuous. See pump readout for details.   Changes:    Medication Change: None at this point Rate Change: 20% increase  Reported side-effects or adverse reactions: None reported  Effectiveness: Described as relatively effective, allowing for increase in activities of daily living (ADL) Clinically meaningful improvement in function (CMIF): Sustained CMIF goals met  Plan: Pump refill today w/ Analysis and programming  Pre-Procedure Preparation:  Monitoring: As per clinic protocol. Respiration, ETCO2, SpO2, BP, heart rate and rhythm monitor placed and checked for adequate function Safety Precautions: Patient was assessed for positional comfort and pressure points before starting the procedure. Time-out: I initiated and conducted the "Time-out" before starting the procedure, as per protocol. The patient was asked to participate by confirming the accuracy of the "Time Out" information. Verification of the correct person, site, and procedure were performed and confirmed by me, the nursing staff, and the patient. "Time-out" conducted as per Joint Commission's Universal Protocol (UP.01.01.01). Time: 1213  Description of Procedure #1:  Area Prepped: Entire shoulder Area DuraPrep (Iodine Povacrylex [0.7% available iodine] and Isopropyl Alcohol, 74% w/w) Safety Precautions: Aspiration looking for blood return was conducted prior to all injections. At no point did we inject any substances, as a needle was being advanced. No attempts were made at seeking any paresthesias. Safe injection practices and needle disposal techniques used. Medications properly checked for expiration dates. SDV (  single dose vial) medications used. Description of the Procedure: Protocol guidelines were followed. The patient was placed in position over the procedure table. The target area was identified and the area prepped in the usual manner. Skin & deeper tissues infiltrated with local anesthetic. Appropriate amount of time allowed to pass for local anesthetics to take effect. The  procedure needles were then advanced to the target area. Proper needle placement secured. Negative aspiration confirmed. Solution injected in intermittent fashion, asking for systemic symptoms every 0.5cc of injectate. The needles were then removed and the area cleansed, making sure to leave some of the prepping solution back to take advantage of its long term bactericidal properties.         Vitals:   12/23/19 1207 12/23/19 1210 12/23/19 1214 12/23/19 1219  BP: (!) 145/81 (!) 142/77 (!) 143/82 137/88  Resp: 17 11 (!) 8 13  Temp:      TempSrc:      SpO2: 100% 100% 100% 100%  Weight:      Height:        Start Time: 1213 hrs. End Time: 1219 hrs. Materials:  Needle(s) Type: Spinal Needle Gauge: 22G Length: 3.5-in Medication(s): Please see orders for medications and dosing details.  Imaging Guidance (Non-Spinal) for procedure #1:  Type of Imaging Technique: Fluoroscopy Guidance (Non-Spinal) Indication(s): Assistance in needle guidance and placement for procedures requiring needle placement in or near specific anatomical locations not easily accessible without such assistance. Exposure Time: Please see nurses notes. Contrast: None used. Fluoroscopic Guidance: I was personally present during the use of fluoroscopy. "Tunnel Vision Technique" used to obtain the best possible view of the target area. Parallax error corrected before commencing the procedure. "Direction-depth-direction" technique used to introduce the needle under continuous pulsed fluoroscopy. Once target was reached, antero-posterior, oblique, and lateral fluoroscopic projection used confirm needle placement in all planes. Images permanently stored in EMR. Interpretation: No contrast injected. I personally interpreted the imaging intraoperatively. Adequate needle placement confirmed in multiple planes. Permanent images saved into the patient's record.  Description of Procedure #2:  Position: Supine Target Area: Central-port of  intrathecal pump. Approach: Anterior, 90 degree angle approach. Area Prepped: Entire Area around the pump implant. DuraPrep (Iodine Povacrylex [0.7% available iodine] and Isopropyl Alcohol, 74% w/w) Safety Precautions: Aspiration looking for blood return was conducted prior to all injections. At no point did we inject any substances, as a needle was being advanced. No attempts were made at seeking any paresthesias. Safe injection practices and needle disposal techniques used. Medications properly checked for expiration dates. SDV (single dose vial) medications used. Description of the Procedure: Protocol guidelines were followed. Two nurses trained to do implant refills were present during the entire procedure. The refill medication was checked by both healthcare providers as well as the patient. The patient was included in the "Time-out" to verify the medication. The patient was placed in position. The pump was identified. The area was prepped in the usual manner. The sterile template was positioned over the pump, making sure the side-port location matched that of the pump. Both, the pump and the template were held for stability. The needle provided in the Medtronic Kit was then introduced thru the center of the template and into the central port. The pump content was aspirated and discarded volume documented. The new medication was slowly infused into the pump, thru the filter, making sure to avoid overpressure of the device. The needle was then removed and the area cleansed, making sure to leave some of  the prepping solution back to take advantage of its long term bactericidal properties. The pump was interrogated and programmed to reflect the correct medication, volume, and dosage. The program was printed and taken to the physician for approval. Once checked and signed by the physician, a copy was provided to the patient and another scanned into the EMR. Vitals:   12/23/19 1207 12/23/19 1210 12/23/19 1214  12/23/19 1219  BP: (!) 145/81 (!) 142/77 (!) 143/82 137/88  Resp: 17 11 (!) 8 13  Temp:      TempSrc:      SpO2: 100% 100% 100% 100%  Weight:      Height:        Start Time: 1213 hrs. End Time: 1219 hrs. Materials & Medications: Medtronic Refill Kit Medication(s): Please see chart orders for details.  Imaging Guidance for procedure #2:  Type of Imaging Technique: None used Indication(s): N/A Exposure Time: No patient exposure Contrast: None used. Fluoroscopic Guidance: N/A Ultrasound Guidance: N/A Interpretation: N/A  Antibiotic Prophylaxis:   Anti-infectives (From admission, onward)   None     Indication(s): None identified  Post-operative Assessment:  Post-procedure Vital Signs:  Pulse/HCG Rate:  75 Temp: (!) 97.3 F (36.3 C) Resp: 13 BP: 137/88 SpO2: 100 %  EBL: None  Complications: No immediate post-treatment complications observed by team, or reported by patient.  Note: The patient tolerated the entire procedure well. A repeat set of vitals were taken after the procedure and the patient was kept under observation following institutional policy, for this type of procedure. Post-procedural neurological assessment was performed, showing return to baseline, prior to discharge. The patient was provided with post-procedure discharge instructions, including a section on how to identify potential problems. Should any problems arise concerning this procedure, the patient was given instructions to immediately contact us, at any time, without hesitation. In any case, we plan to contact the patient by telephone for a follow-up status report regarding this interventional procedure.  Comments:  No additional relevant information.  Plan of Care  Orders:  Orders Placed This Encounter  Procedures  . SHOULDER INJECTION    Scheduling Instructions:     Procedure: Intra-articular shoulder (Glenohumeral) joint and (AC) Acromioclavicular joint injection     Side: Right-sided      Level: Glenohumeral joint and (AC) Acromioclavicular joint     Sedation: Patient's choice.     Timeframe: Today    Order Specific Question:   Where will this procedure be performed?    Answer:   [facility]  . PUMP REFILL    Maintain Protocol by having two(2) healthcare providers during procedure and programming.    Scheduling Instructions:     Please refill intrathecal pump today.    Order Specific Question:   Where will this procedure be performed?    Answer:   [facility]  . PUMP REFILL    Whenever possible schedule on a procedure today.    Standing Status:   Future    Standing Expiration Date:   05/21/2020    Scheduling Instructions:     Please schedule intrathecal pump refill based on pump programming. Avoid schedule intervals of more than 120 days (4 months).    Order Specific Question:   Where will this procedure be performed?    Answer:   [facility]  . PUMP REPROGRAM    Follow programming protocol by having two(2) healthcare providers present during programming.    Scheduling Instructions:     Please perform the following  adjustment: Increase rate by 20%.    Order Specific Question:   Where will this procedure be performed?    Answer:   [facility]  . DG PAIN CLINIC C-ARM 1-60 MIN NO REPORT    Intraoperative interpretation by procedural physician at [facility].    Standing Status:   Standing    Number of Occurrences:   1    Order Specific Question:   Reason for exam:    Answer:   Assistance in needle guidance and placement for procedures requiring needle placement in or near specific anatomical locations not easily accessible without such assistance.  . Informed Consent Details: Physician/Practitioner Attestation; Transcribe to consent form and obtain patient signature    Note: Always confirm laterality of pain with Mr. Sherrard, before procedure.    Order Specific Question:   Physician/Practitioner attestation of informed consent  for procedure/surgical case    Answer:   I, the physician/practitioner, attest that I have discussed with the patient the benefits, risks, side effects, alternatives, likelihood of achieving goals and potential problems during recovery for the procedure that I have provided informed consent.    Order Specific Question:   Procedure    Answer:   Intra-articular shoulder joint injection under fluoroscopic guidance    Order Specific Question:   Physician/Practitioner performing the procedure    Answer:   Daxton Nydam A. Dossie Arbour, MD    Order Specific Question:   Indication/Reason    Answer:   Chronic shoulder pain secondary to shoulder arthropathy  . Care order/instruction: Please confirm that the patient has stopped the Eliquis (Apixaban) x 3 days prior to procedure or surgery.    Please confirm that the patient has stopped the Eliquis (Apixaban) x 3 days prior to procedure or surgery.    Standing Status:   Standing    Number of Occurrences:   1  . Provide equipment / supplies at bedside    "Block Tray" (Disposable  single use) Needle type: SpinalSpinal Amount/quantity: 1 Size: Regular (3.5-inch) Gauge: 22G    Standing Status:   Standing    Number of Occurrences:   1    Order Specific Question:   Specify    Answer:   Block Tray  . Informed Consent Details: Physician/Practitioner Attestation; Transcribe to consent form and obtain patient signature    Transcribe to consent form and obtain patient signature.    Order Specific Question:   Physician/Practitioner attestation of informed consent for procedure/surgical case    Answer:   I, the physician/practitioner, attest that I have discussed with the patient the benefits, risks, side effects, alternatives, likelihood of achieving goals and potential problems during recovery for the procedure that I have provided informed consent.    Order Specific Question:   Procedure    Answer:   Intrathecal pump refill    Order Specific Question:    Physician/Practitioner performing the procedure    Answer:   Attending Physician: Kathlen Brunswick. Dossie Arbour, MD & designated trained staff    Order Specific Question:   Indication/Reason    Answer:   Chronic Pain Syndrome (G89.4), presence of an intrathecal pump (Z97.8)  . Bleeding precautions    Standing Status:   Standing    Number of Occurrences:   1   Chronic Opioid Analgesic:  Oxycodone IR 10 mg, 1 tabl PO q 6 hrs (40 mg/day of oxycodone) MME/day: 60 mg/day.   Medications ordered for procedure: Meds ordered this encounter  Medications  . lidocaine (XYLOCAINE) 2 % (with pres)  injection 400 mg  . DISCONTD: midazolam (VERSED) 5 MG/5ML injection 1-2 mg    Make sure Flumazenil is available in the pyxis when using this medication. If oversedation occurs, administer 0.2 mg IV over 15 sec. If after 45 sec no response, administer 0.2 mg again over 1 min; may repeat at 1 min intervals; not to exceed 4 doses (1 mg)  . DISCONTD: fentaNYL (SUBLIMAZE) injection 25-50 mcg    Make sure Narcan is available in the pyxis when using this medication. In the event of respiratory depression (RR< 8/min): Titrate NARCAN (naloxone) in increments of 0.1 to 0.2 mg IV at 2-3 minute intervals, until desired degree of reversal.  . methylPREDNISolone acetate (DEPO-MEDROL) injection 80 mg  . ropivacaine (PF) 2 mg/mL (0.2%) (NAROPIN) injection 9 mL  . Oxycodone HCl 10 MG TABS    Sig: Take 1 tablet (10 mg total) by mouth every 6 (six) hours as needed. Must last 30 days    Dispense:  120 tablet    Refill:  0    Chronic Pain: STOP Act (Not applicable) Fill 1 day early if closed on refill date. Avoid benzodiazepines within 8 hours of opioids  . Oxycodone HCl 10 MG TABS    Sig: Take 1 tablet (10 mg total) by mouth every 6 (six) hours as needed. Must last 30 days    Dispense:  120 tablet    Refill:  0    Chronic Pain: STOP Act (Not applicable) Fill 1 day early if closed on refill date. Avoid benzodiazepines within 8 hours of  opioids  . Oxycodone HCl 10 MG TABS    Sig: Take 1 tablet (10 mg total) by mouth every 6 (six) hours as needed. Must last 30 days    Dispense:  120 tablet    Refill:  0    Chronic Pain: STOP Act (Not applicable) Fill 1 day early if closed on refill date. Avoid benzodiazepines within 8 hours of opioids  . methocarbamol (ROBAXIN) 500 MG tablet    Sig: Take 1 tablet (500 mg total) by mouth every 8 (eight) hours as needed for muscle spasms.    Dispense:  90 tablet    Refill:  2    Fill 1 day early if pharmacy is closed on scheduled refill date. Generic permitted. Void old duplicate prescription / refill. Do not send renewal requests.   Medications administered: We administered lidocaine, methylPREDNISolone acetate, and ropivacaine (PF) 2 mg/mL (0.2%).  See the medical record for exact dosing, route, and time of administration.  Follow-up plan:   Return for Pump Refill (Max:2mo.       Interventional treatment options: Planned, scheduled, and/or pending:   Palliative intrathecal pump management    Under consideration:   Diagnostic bilateral IA hip joint injection Diagnostic bilateral lumbar facet block#1 Possible bilateral lumbar facet RFA Diagnostic caudal ESI + diagnostic epidurogram Possible Racz procedure Diagnostic left cervical ESI Diagnosticleft cervical facet block Possible left cervical facet RFA   Therapeutic/palliative (PRN):   Diagnostic left IA shoulder injection #2  Therapeutic/palliative intrathecal pump refill(s)    Recent Visits Date Type Provider Dept  11/01/19 Telemedicine LGillis Santa MD [facility]  09/28/19 Procedure visit NMilinda Pointer MD [facility]  Showing recent visits within past 90 days and meeting all other requirements Today's Visits Date Type Provider Dept  12/23/19 Procedure visit NMilinda Pointer MD [facility]  Showing today's visits and meeting all other requirements Future  Appointments Date Type Provider Dept  01/06/20 Appointment  Milinda Pointer, MD [facility]  Showing future appointments within next 90 days and meeting all other requirements  Disposition: Discharge home  Discharge (Date  Time): 12/23/2019; 1230 hrs.   Primary Care Physician: [name], MD Location: [facility] Note by: Gaspar Cola, MD Date: 12/23/2019; Time: 2:40 PM  Disclaimer:  Medicine is not an Chief Strategy Officer. The only guarantee in medicine is that nothing is guaranteed. It is important to note that the decision to proceed with this intervention was based on the information collected from the patient. The Data and conclusions were drawn from the patient's questionnaire, the interview, and the physical examination. Because the information was provided in large part by the patient, it cannot be guaranteed that it has not been purposely or unconsciously manipulated. Every effort has been made to obtain as much relevant data as possible for this evaluation. It is important to note that the conclusions that lead to this procedure are derived in large part from the available data. Always take into account that the treatment will also be dependent on availability of resources and existing treatment guidelines, considered by other Pain Management Practitioners as being common knowledge and practice, at the time of the intervention. For Medico-Legal purposes, it is also important to point out that variation in procedural techniques and pharmacological choices are the acceptable norm. The indications, contraindications, technique, and results of the above procedure should only be interpreted and judged by a Board-Certified Interventional Pain Specialist with extensive familiarity and expertise in the same exact procedure and technique.

## 2019-12-23 NOTE — Progress Notes (Signed)
Nursing Pain Medication Assessment:  Safety precautions to be maintained throughout the outpatient stay will include: orient to surroundings, keep bed in low position, maintain call bell within reach at all times, provide assistance with transfer out of bed and ambulation.  Medication Inspection Compliance: Darrell White did not comply with our request to bring his pills to be counted. He was reminded that bringing the medication bottles, even when empty, is a requirement.  Medication: None brought in. Pill/Patch Count: None available to be counted. Bottle Appearance: No container available. Did not bring bottle(s) to appointment. Filled Date: N/A Last Medication intake:  Today

## 2019-12-24 ENCOUNTER — Telehealth: Payer: Self-pay

## 2019-12-24 NOTE — Telephone Encounter (Signed)
Post procedure phone call.  Patient states he is doing good.  

## 2020-01-04 DIAGNOSIS — E7849 Other hyperlipidemia: Secondary | ICD-10-CM | POA: Diagnosis not present

## 2020-01-04 DIAGNOSIS — E039 Hypothyroidism, unspecified: Secondary | ICD-10-CM | POA: Diagnosis not present

## 2020-01-04 DIAGNOSIS — I10 Essential (primary) hypertension: Secondary | ICD-10-CM | POA: Diagnosis not present

## 2020-01-05 ENCOUNTER — Encounter: Payer: Self-pay | Admitting: Pain Medicine

## 2020-01-06 ENCOUNTER — Other Ambulatory Visit: Payer: Self-pay

## 2020-01-06 ENCOUNTER — Ambulatory Visit: Payer: PPO | Attending: Pain Medicine | Admitting: Pain Medicine

## 2020-01-06 DIAGNOSIS — Z95828 Presence of other vascular implants and grafts: Secondary | ICD-10-CM | POA: Diagnosis not present

## 2020-01-06 DIAGNOSIS — G894 Chronic pain syndrome: Secondary | ICD-10-CM

## 2020-01-06 DIAGNOSIS — G8929 Other chronic pain: Secondary | ICD-10-CM | POA: Diagnosis not present

## 2020-01-06 DIAGNOSIS — M25512 Pain in left shoulder: Secondary | ICD-10-CM | POA: Diagnosis not present

## 2020-01-06 DIAGNOSIS — F1120 Opioid dependence, uncomplicated: Secondary | ICD-10-CM | POA: Diagnosis not present

## 2020-01-06 DIAGNOSIS — M19012 Primary osteoarthritis, left shoulder: Secondary | ICD-10-CM | POA: Diagnosis not present

## 2020-01-06 DIAGNOSIS — M25511 Pain in right shoulder: Secondary | ICD-10-CM | POA: Diagnosis not present

## 2020-01-06 NOTE — Progress Notes (Signed)
Patient: Darrell White  Service Category: E/M  Provider: Gaspar Cola, MD  DOB: January 27, 1951  DOS: 01/06/2020  Location: Office  MRN: [id number]  Setting: Ambulatory outpatient  Referring Provider: [name], MD  Type: Established Patient  Specialty: Interventional Pain Management  PCP: [name], MD  Location: Remote location  Delivery: TeleHealth     Virtual Encounter - Pain Management PROVIDER NOTE: Information contained herein reflects review and annotations entered in association with encounter. Interpretation of such information and data should be left to medically-trained personnel. Information provided to patient can be located elsewhere in the medical record under "Patient Instructions". Document created using STT-dictation technology, any transcriptional errors that may result from process are unintentional.    Contact & Pharmacy Preferred: [phone] Home: [phone] (home) Mobile: [phone] (mobile) E-mail: No e-mail address on record  [facility] [place] [address] [place] [address] Phone: [phone] Fax: [phone]   Pre-screening  Mr. Ricard Dillon offered "in-person" vs "virtual" encounter. He indicated preferring virtual for this encounter.   Reason COVID-19*  Social distancing based on CDC and AMA recommendations.   I contacted Sabas Sous on 01/06/2020 via telephone.      I clearly identified myself as Gaspar Cola, MD. I verified that I was speaking with the correct person using two identifiers (Name: JARRET TORRE, and date of birth: 06-May-1950).  Consent I sought verbal advanced consent from Sabas Sous for virtual visit interactions. I informed Mr. Betke of possible security and privacy concerns, risks, and limitations associated with providing "not-in-person" medical evaluation and management services. I also informed Mr. Shoultz of the availability of "in-person" appointments. Finally, I  informed him that there would be a charge for the virtual visit and that he could be  personally, fully or partially, financially responsible for it. Mr. Piscopo expressed understanding and agreed to proceed.   Historic Elements   Mr. TESLA BOCHICCHIO is a 69 y.o. year old, male patient evaluated today after our last contact on 12/23/2019. Mr. 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 He also  has a past surgical history that includes Mouth surgery; Cervical disc surgery; Shoulder arthroscopy; Back surgery; Spinal fusion; Spine surgery; Knee arthroscopy; Cataract extraction w/PHACO (Left, 04/09/2012); Direct laryngoscopy (N/A, 03/26/2016); Excision of tongue lesion (Right, 03/26/2016); OTHER SURGICAL HISTORY; CT LUNG SCREENING (06/16/2017); Direct laryngoscopy (N/A, 05/31/2019); and Intrathecal pump implant (Right, 08/02/2019). Mr. Harewood has a current medication list which includes the following prescription(s): amiodarone, atorvastatin, diclofenac sodium, gabapentin, levothyroxine, methocarbamol, metoprolol succinate, mirtazapine, oxycodone hcl, [START ON 02/01/2020] oxycodone hcl, [START ON 03/02/2020] oxycodone hcl, sotalol, tiotropium, and trazodone. He  reports that he has been smoking cigarettes. He has a 110.00 pack-year smoking history. He has never used smokeless tobacco. He reports that he does not drink alcohol and does not use drugs. Mr. Pereyra is allergic to nsaids.   HPI  Today, he is being contacted for a post-procedure assessment.  According to the patient the left shoulder joint injection has provided him with 100% relief of the pain that as of today, has not come back.  The patient was instructed to give [place] a call if the pain  starts going back and he wants [place] to reevaluate it for further treatment.  She  understood and agreed.  Post-Procedure Evaluation  Procedure (12/23/2019): Diagnostic left glenohumeral and acromioclavicular joint injection #1 under fluoroscopic guidance no sedation Pre-procedure pain level: 10/10 Post-procedure: 4/10 Limited initial benefit, possibly due to rapid discharge after no sedation procedure, without enough time to allow full onset of block.  Sedation: None.  Effectiveness during initial hour after procedure(Ultra-Short Term Relief): 100 %.  Local anesthetic used: Long-acting (4-6 hours) Effectiveness: Defined as any analgesic benefit obtained secondary to the administration of local anesthetics. This carries significant diagnostic value as to the etiological location, or anatomical origin, of the pain. Duration of benefit is expected to coincide with the duration of the local anesthetic used.  Effectiveness during initial 4-6 hours after procedure(Short-Term Relief): 100 %.  Long-term benefit: Defined as any relief past the pharmacologic duration of the local anesthetics.  Effectiveness past the initial 6 hours after procedure(Long-Term Relief): 100 %.  Current benefits: Defined as benefit that persist at this time.   Analgesia:  100% better.  At the time of this follow-up call he indicated that he is still not having any pain come back to the shoulder.  He is extremely happy about this. Function: Mr. Chatterjee reports improvement in function ROM: Mr. Schor reports improvement in ROM  Pharmacotherapy Assessment  Analgesic: Oxycodone IR 10 mg, 1 tabl PO q 6 hrs (40 mg/day of oxycodone) MME/day: 60 mg/day.   Monitoring: [place] PMP: PDMP reviewed during this encounter.       Pharmacotherapy: No side-effects or adverse reactions reported. Compliance: No problems identified. Effectiveness: Clinically acceptable. Plan: Refer to "POC".  UDS:  Summary  Date Value Ref Range Status   03/26/2018 FINAL  Final    Comment:    ==================================================================== TOXASSURE SELECT 13 (MW) ==================================================================== Test                             Result       Flag       Units Drug Present and Declared for Prescription Verification   Oxycodone                      1772         EXPECTED   ng/mg creat   Oxymorphone                    >4115        EXPECTED   ng/mg creat   Noroxycodone                   >4115        EXPECTED   ng/mg creat   Noroxymorphone                 1803         EXPECTED   ng/mg creat    Sources of oxycodone are scheduled prescription medications.    Oxymorphone, noroxycodone, and noroxymorphone are expected    metabolites of oxycodone. Oxymorphone is also available as a    scheduled prescription medication. Drug Present not Declared for Prescription Verification   Morphine                       1085         UNEXPECTED ng/mg creat    Potential sources of large amounts of morphine in the absence of    codeine include administration of morphine or use of heroin. ==================================================================== Test  Result    Flag   Units      Ref Range   Creatinine              243              mg/dL      >=20 ==================================================================== Declared Medications:  The flagging and interpretation on this report are based on the  following declared medications.  Unexpected results may arise from  inaccuracies in the declared medications.  **Note: The testing scope of this panel includes these medications:  Oxycodone  **Note: The testing scope of this panel does not include following  reported medications:  Acetaminophen (Tylenol)  Atorvastatin (Lipitor)  Docusate  Gabapentin (Neurontin)  Levothyroxine  Mirtazapine  Omeprazole  Sotalol  Tiotropium   Trazodone ==================================================================== For clinical consultation, please call [phone]. ====================================================================     Laboratory Chemistry Profile   Renal Lab Results  Component Value Date   BUN 14 08/02/2019   CREATININE 0.90 08/02/2019   BCR 11 10/16/2016   GFRAA >60 08/02/2019   GFRNONAA >60 08/02/2019     Hepatic Lab Results  Component Value Date   AST 18 10/30/2018   ALT 13 10/30/2018   ALBUMIN 4.0 10/30/2018   ALKPHOS 41 10/30/2018     Electrolytes Lab Results  Component Value Date   NA 138 08/02/2019   K 4.2 08/02/2019   CL 103 08/02/2019   CALCIUM 8.8 (L) 08/02/2019   MG 2.0 10/16/2016     Bone Lab Results  Component Value Date   25OHVITD1 47 10/16/2016   25OHVITD2 <1.0 10/16/2016   25OHVITD3 47 10/16/2016     Inflammation (CRP: Acute Phase) (ESR: Chronic Phase) Lab Results  Component Value Date   CRP 1.0 10/16/2016   ESRSEDRATE 3 10/16/2016       Note: Above Lab results reviewed.  Imaging  DG PAIN CLINIC C-ARM 1-60 MIN NO REPORT Fluoro was used, but no Radiologist interpretation will be provided.  Please refer to "NOTES" tab for provider progress note.  Assessment  The primary encounter diagnosis was Chronic pain syndrome. Diagnoses of Chronic shoulder pain (Right), Chronic Acromioclavicular (AC) joint pain (Left), Osteoarthritis of shoulder (Left), Chronic glenohumeral joint pain (Left), Arthralgia of shoulder region (Left), Presence of implanted infusion pump, and Uncomplicated opioid dependence ([place]) were also pertinent to this visit.  Plan of Care  Problem-specific:  No problem-specific Assessment & Plan notes found for this encounter.  Mr. YAFET CLINE has a current medication list which includes the following long-term medication(s): amiodarone, atorvastatin, gabapentin, levothyroxine, methocarbamol, mirtazapine, oxycodone hcl, [START ON  02/01/2020] oxycodone hcl, [START ON 03/02/2020] oxycodone hcl, sotalol, and tiotropium.  Pharmacotherapy (Medications Ordered): No orders of the defined types were placed in this encounter.  Orders:  Orders Placed This Encounter  Procedures  . PUMP REFILL    Whenever possible schedule on a procedure today.    Standing Status:   Future    Standing Expiration Date:   06/04/2020    Scheduling Instructions:     Please schedule intrathecal pump refill based on pump programming. Avoid schedule intervals of more than 120 days (4 months).    Order Specific Question:   Where will this procedure be performed?    Answer:   [facility]   Follow-up plan:   Return for scheduled encounter for intrathecal pump refill.      Interventional treatment options: Planned, scheduled, and/or pending:   Palliative intrathecal pump management    Under consideration:  Diagnostic bilateral IA hip joint injection Diagnostic bilateral lumbar facet block#1 Possible bilateral lumbar facet RFA Diagnostic caudal ESI + diagnostic epidurogram Possible Racz procedure Diagnostic left cervical ESI Diagnosticleft cervical facet block Possible left cervical facet RFA   Therapeutic/palliative (PRN):   Diagnostic left IA shoulder (glenohumeral and AC joint) injection #2 (100/100/100/100) Therapeutic/palliative intrathecal pump refill(s)    Recent Visits Date Type Provider Dept  12/23/19 Procedure visit Milinda Pointer, MD [facility]  11/01/19 Telemedicine Gillis Santa, MD [facility]  Showing recent visits within past 90 days and meeting all other requirements Today's Visits Date Type Provider Dept  01/06/20 Telemedicine Milinda Pointer, MD [facility]  Showing today's visits and meeting all other requirements Future Appointments Date Type Provider Dept  03/23/20 Appointment Milinda Pointer, MD [facility]  Showing future appointments within  next 90 days and meeting all other requirements  I discussed the assessment and treatment plan with the patient. The patient was provided an opportunity to ask questions and all were answered. The patient agreed with the plan and demonstrated an understanding of the instructions.  Patient advised to call back or seek an in-person evaluation if the symptoms or condition worsens.  Duration of encounter: 12 minutes.  Note by: Gaspar Cola, MD Date: 01/06/2020; Time: 5:37 PM

## 2020-01-17 MED FILL — Medication: INTRATHECAL | Qty: 1 | Status: AC

## 2020-02-01 ENCOUNTER — Other Ambulatory Visit: Payer: Self-pay

## 2020-02-01 ENCOUNTER — Encounter: Payer: Self-pay | Admitting: Urology

## 2020-02-01 ENCOUNTER — Ambulatory Visit (INDEPENDENT_AMBULATORY_CARE_PROVIDER_SITE_OTHER): Payer: PPO | Admitting: Urology

## 2020-02-01 VITALS — BP 97/53 | HR 80 | Temp 98.5°F | Ht 68.0 in | Wt 150.0 lb

## 2020-02-01 DIAGNOSIS — R972 Elevated prostate specific antigen [PSA]: Secondary | ICD-10-CM

## 2020-02-01 LAB — URINALYSIS, ROUTINE W REFLEX MICROSCOPIC
Bilirubin, UA: NEGATIVE
Glucose, UA: NEGATIVE
Leukocytes,UA: NEGATIVE
Nitrite, UA: NEGATIVE
Protein,UA: NEGATIVE
Specific Gravity, UA: >1.03 — ABNORMAL HIGH (ref 1.005–1.030)
Urobilinogen, Ur: 0.2 mg/dL (ref 0.2–1.0)
pH, UA: 5 (ref 5.0–7.5)

## 2020-02-01 LAB — MICROSCOPIC EXAMINATION
Bacteria, UA: NONE SEEN
Epithelial Cells (non renal): NONE SEEN /hpf (ref 0–10)
Renal Epithel, UA: NONE SEEN /hpf
WBC, UA: NONE SEEN /hpf (ref 0–5)

## 2020-02-01 NOTE — Progress Notes (Signed)
02/01/2020 4:25 PM   Darrell White 07-05-1950 [id number]  Referring provider: [name], MD [place],  [place] [address]  Elevated PSA  HPI: Darrell White is a [phone] here for evaluation of elevated PSA. PSA was 8.0 on 12/15/2019. He denies any LUTS. His brother and father had prostate cancer diagnosed in their 104s. Neither died from prostate cancer. He is on eliquis for afib. No hematuria or dysuria   PMH: Past Medical History:  Diagnosis Date  . Anxiety   . Arthritis   . Bronchitis 03/25/2017  . Cancer ([place])    right base of tongue  . Depression   . Dysrhythmia    AFIB  . GERD (gastroesophageal reflux disease)   . HOH (hard of hearing)   . Hypercholesteremia   . Hypertension   . Hypothyroid   . 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 Neuropathy   . 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 BACK SURGERY     had total of 7 surgeries with rods and plates  . CATARACT EXTRACTION W/PHACO Left 04/09/2012   Procedure: CATARACT EXTRACTION PHACO AND INTRAOCULAR LENS PLACEMENT (IOC);  Surgeon: Tonny Branch, MD;  Location: [facility];  Service: Ophthalmology;  Laterality: Left;  CDE: 12.44  . CERVICAL DISC SURGERY    . CT LUNG SCREENING  06/16/2017  . DIRECT LARYNGOSCOPY N/A 03/26/2016   Procedure: DIRECT LARYNGOSCOPY;  Surgeon: Leta Baptist, MD;  Location: [place];  Service: ENT;  Laterality: N/A;  . DIRECT LARYNGOSCOPY N/A 05/31/2019   Procedure: DIRECT LARYNGOSCOPY WITH BIOPSY;  Surgeon: Leta Baptist, MD;  Location: [place];  Service: ENT;  Laterality: N/A;  . EXCISION OF TONGUE LESION Right 03/26/2016   Procedure: BIOPSY OF RIGHT TONGUE BASE MASS;  Surgeon: Leta Baptist, MD;  Location: [place];  Service: ENT;  Laterality: Right;  . INTRATHECAL PUMP IMPLANT Right 08/02/2019   Procedure: REPLACEMENT RIGHT ABDOMINAL PROGRAMMABLE  MORPHINE PUMP;  Surgeon: Deetta Perla, MD;  Location: [facility];  Service: Neurosurgery;  Laterality: Right;  . KNEE ARTHROSCOPY     right knee  . MOUTH SURGERY     removal of cancer  . OTHER SURGICAL HISTORY     insertion of pain pump  . SHOULDER ARTHROSCOPY     bilateral  . SPINAL FUSION    . SPINE SURGERY     insertion of morphine pump into spine    Home Medications:  Allergies as of 02/01/2020      Reactions   Nsaids Other (See Comments)   "upset stomach" per pt      Medication List       Accurate as of February 01, 2020  4:25 PM. If you have any questions, ask your nurse or doctor.        amiodarone 200 MG tablet Commonly known as: PACERONE Take 200 mg by mouth daily.   atorvastatin 40 MG tablet Commonly known as: LIPITOR Take 40 mg by mouth daily.   diclofenac sodium 1 % Gel Commonly known as: VOLTAREN Apply 2 g topically 3 (three) times daily as needed.   gabapentin 300 MG capsule Commonly known as: NEURONTIN Take 300 mg by mouth daily as needed (pain).   levothyroxine 125 MCG tablet Commonly known as: SYNTHROID Take 125 mcg by mouth daily before breakfast.   methocarbamol 500  MG tablet Commonly known as: ROBAXIN Take 1 tablet (500 mg total) by mouth every 8 (eight) hours as needed for muscle spasms.   metoprolol succinate 50 MG 24 hr tablet Commonly known as: TOPROL-XL Take 50 mg by mouth daily.   mirtazapine 45 MG tablet Commonly known as: REMERON Take 45 mg by mouth at bedtime.   Oxycodone HCl 10 MG Tabs Take 1 tablet (10 mg total) by mouth every 6 (six) hours as needed. Must last 30 days   Oxycodone HCl 10 MG Tabs Take 1 tablet (10 mg total) by mouth every 6 (six) hours as needed. Must last 30 days   Oxycodone HCl 10 MG Tabs Take 1 tablet (10 mg total) by mouth every 6 (six) hours as needed. Must last 30 days Start taking on: March 02, 2020   sotalol 120 MG tablet Commonly known as: BETAPACE Take 120 mg by mouth daily.   tiotropium  18 MCG inhalation capsule Commonly known as: SPIRIVA Place 18 mcg into inhaler and inhale daily.   traZODone 50 MG tablet Commonly known as: DESYREL Take 200 mg by mouth at bedtime.       Allergies:  Allergies  Allergen Reactions  . Nsaids Other (See Comments)    "upset stomach" per pt    Family History: Family History  Problem Relation Age of Onset  . Cancer Father   . Cancer Brother        Prostate cancer    Social History:  reports that he has been smoking cigarettes. He has a 110.00 pack-year smoking history. He has never used smokeless tobacco. He reports that he does not drink alcohol and does not use drugs.  ROS: All other review of systems were reviewed and are negative except what is noted above in HPI  Physical Exam: BP (!) 97/53   Pulse 80   Temp 98.5 F (36.9 C)   Ht 5\' 8"  (1.727 m)   Wt 150 lb (68 kg)   BMI 22.81 kg/m   Constitutional:  Alert and oriented, No acute distress. HEENT: [place] AT, moist mucus membranes.  Trachea midline, no masses. Cardiovascular: No clubbing, cyanosis, or edema. Respiratory: Normal respiratory effort, no increased work of breathing. GI: Abdomen is soft, nontender, nondistended, no abdominal masses GU: No CVA tenderness. Circumcised phallus. No masses/lesions on penis, testis, scrotum. Prostate 30g smooth no nodules no induration.  Lymph: No cervical or inguinal lymphadenopathy. Skin: No rashes, bruises or suspicious lesions. Neurologic: Grossly intact, no focal deficits, moving all 4 extremities. Psychiatric: Normal mood and affect.  Laboratory Data: Lab Results  Component Value Date   WBC 5.0 08/02/2019   HGB 12.3 (L) 08/02/2019   HCT 36.8 (L) 08/02/2019   MCV 91.8 08/02/2019   PLT 187 08/02/2019    Lab Results  Component Value Date   CREATININE 0.90 08/02/2019    No results found for: PSA  No results found for: TESTOSTERONE  No results found for: HGBA1C  Urinalysis No results found for: COLORURINE,  APPEARANCEUR, LABSPEC, PHURINE, GLUCOSEU, HGBUR, BILIRUBINUR, KETONESUR, PROTEINUR, UROBILINOGEN, NITRITE, LEUKOCYTESUR  No results found for: LABMICR, [place], RBCUA, LABEPIT, MUCUS, BACTERIA  Pertinent Imaging:  No results found for this or any previous visit.  No results found for this or any previous visit.  No results found for this or any previous visit.  No results found for this or any previous visit.  No results found for this or any previous visit.  No results found for this or any previous visit.  No results found for this or any previous visit.  No results found for this or any previous visit.   Assessment & Plan:    1. Elevated PSA The patient and I talked about etiologies of elevated PSA.  We discussed the possible relationship between elevated PSA and prostate cancer, BPH, prostatitis, infection trauma and recent ejaculations.  The patient's PSA has been relatively stable over the interval and as such I recommended that we follow-up with a repeat PSA in 6 months.  If it remains elevated with a positive rising trend we will discuss prostate biopsy at his follow-up appointment. - Urinalysis, Routine w reflex microscopic   No follow-ups on file.  Nicolette Bang, MD  [facility] [place]

## 2020-02-01 NOTE — Progress Notes (Signed)
Urological Symptom Review  Patient is experiencing the following symptoms: none  Review of Systems  Gastrointestinal (upper)  : Negative for upper GI symptoms  Gastrointestinal (lower) : Constipation  Constitutional : Negative for symptoms  Skin: Negative for skin symptoms  Eyes: Negative for eye symptoms  Ear/Nose/Throat : Negative for Ear/Nose/Throat symptoms  Hematologic/Lymphatic: Negative for Hematologic/Lymphatic symptoms  Cardiovascular : Negative for cardiovascular symptoms  Respiratory : Shortness of breath  Endocrine: Negative for endocrine symptoms  Musculoskeletal: Back pain  Neurological: Negative for neurological symptoms  Psychologic: Negative for psychiatric symptoms

## 2020-02-02 LAB — PSA: Prostate Specific Ag, Serum: 8.2 ng/mL — ABNORMAL HIGH (ref 0.0–4.0)

## 2020-02-04 DIAGNOSIS — M1612 Unilateral primary osteoarthritis, left hip: Secondary | ICD-10-CM | POA: Diagnosis not present

## 2020-02-04 DIAGNOSIS — I10 Essential (primary) hypertension: Secondary | ICD-10-CM | POA: Diagnosis not present

## 2020-02-04 DIAGNOSIS — E7849 Other hyperlipidemia: Secondary | ICD-10-CM | POA: Diagnosis not present

## 2020-02-04 DIAGNOSIS — E039 Hypothyroidism, unspecified: Secondary | ICD-10-CM | POA: Diagnosis not present

## 2020-02-26 DIAGNOSIS — Z6823 Body mass index (BMI) 23.0-23.9, adult: Secondary | ICD-10-CM | POA: Diagnosis not present

## 2020-02-26 DIAGNOSIS — J449 Chronic obstructive pulmonary disease, unspecified: Secondary | ICD-10-CM | POA: Diagnosis not present

## 2020-02-26 DIAGNOSIS — F331 Major depressive disorder, recurrent, moderate: Secondary | ICD-10-CM | POA: Diagnosis not present

## 2020-02-26 DIAGNOSIS — I4891 Unspecified atrial fibrillation: Secondary | ICD-10-CM | POA: Diagnosis not present

## 2020-02-28 ENCOUNTER — Other Ambulatory Visit: Payer: Self-pay

## 2020-02-28 MED ORDER — PAIN MANAGEMENT IT PUMP REFILL: 1.0000 | Freq: Once | INTRATHECAL | 0 refills | Status: AC

## 2020-03-02 ENCOUNTER — Telehealth: Payer: Self-pay

## 2020-03-02 ENCOUNTER — Telehealth: Payer: Self-pay | Admitting: Urology

## 2020-03-02 ENCOUNTER — Other Ambulatory Visit: Payer: Self-pay

## 2020-03-02 DIAGNOSIS — R972 Elevated prostate specific antigen [PSA]: Secondary | ICD-10-CM

## 2020-03-02 NOTE — Telephone Encounter (Signed)
Pt called and LM regarding results.

## 2020-03-02 NOTE — Telephone Encounter (Signed)
Spoke with pt. Notified him of biopsy suggestion. Had a hard time trying to schedule with pt. He said he does not read or write. He will have his wife call us back to schedule.

## 2020-03-03 NOTE — Progress Notes (Signed)
Pt scheduled for biopsy, instructions gone over with wife and mailed and all orders in.

## 2020-03-04 DIAGNOSIS — E039 Hypothyroidism, unspecified: Secondary | ICD-10-CM | POA: Diagnosis not present

## 2020-03-04 DIAGNOSIS — I10 Essential (primary) hypertension: Secondary | ICD-10-CM | POA: Diagnosis not present

## 2020-03-04 DIAGNOSIS — E7849 Other hyperlipidemia: Secondary | ICD-10-CM | POA: Diagnosis not present

## 2020-03-04 DIAGNOSIS — M1612 Unilateral primary osteoarthritis, left hip: Secondary | ICD-10-CM | POA: Diagnosis not present

## 2020-03-09 MED ORDER — LEVOFLOXACIN 500 MG PO TABS: 500.0000 mg | ORAL_TABLET | Freq: Every day | ORAL | 0 refills | Status: DC

## 2020-03-14 DIAGNOSIS — Z20828 Contact with and (suspected) exposure to other viral communicable diseases: Secondary | ICD-10-CM | POA: Diagnosis not present

## 2020-03-15 ENCOUNTER — Telehealth: Payer: Self-pay

## 2020-03-15 NOTE — Telephone Encounter (Signed)
Pts wife called saying pt has the flu per his primary dr and he has a biopsy scheduled for Friday. I cancelled on our schedule and called [facility] at [facility] to cancel and they sent me to [facility]  They said they have no biopsy listed so sent me back to [facility].  They sent me to [facility] and no one answered the phone. I will attempt to call them again the morning.

## 2020-03-16 ENCOUNTER — Telehealth: Payer: Self-pay

## 2020-03-16 NOTE — Telephone Encounter (Signed)
Called [facility] [place] and made them aware prostate Biopsy cancelled because pt is sick. Pt is to call back when better.

## 2020-03-17 ENCOUNTER — Other Ambulatory Visit: Payer: PPO | Admitting: Urology

## 2020-03-17 DIAGNOSIS — R059 Cough, unspecified: Secondary | ICD-10-CM | POA: Diagnosis not present

## 2020-03-17 DIAGNOSIS — J441 Chronic obstructive pulmonary disease with (acute) exacerbation: Secondary | ICD-10-CM | POA: Diagnosis not present

## 2020-03-17 DIAGNOSIS — Z20828 Contact with and (suspected) exposure to other viral communicable diseases: Secondary | ICD-10-CM | POA: Diagnosis not present

## 2020-03-22 ENCOUNTER — Other Ambulatory Visit: Payer: Self-pay | Admitting: Urology

## 2020-03-22 DIAGNOSIS — R972 Elevated prostate specific antigen [PSA]: Secondary | ICD-10-CM

## 2020-03-23 ENCOUNTER — Encounter: Payer: PPO | Admitting: Pain Medicine

## 2020-03-27 ENCOUNTER — Other Ambulatory Visit: Payer: Self-pay

## 2020-03-27 DIAGNOSIS — R972 Elevated prostate specific antigen [PSA]: Secondary | ICD-10-CM

## 2020-03-27 MED ORDER — LEVOFLOXACIN 500 MG PO TABS: 500.0000 mg | ORAL_TABLET | Freq: Every day | ORAL | 0 refills | Status: DC

## 2020-03-28 ENCOUNTER — Telehealth: Payer: Self-pay

## 2020-03-28 ENCOUNTER — Other Ambulatory Visit: Payer: Self-pay

## 2020-03-28 MED ORDER — PAIN MANAGEMENT IT PUMP REFILL: 1.0000 | Freq: Once | INTRATHECAL | 0 refills | Status: AC

## 2020-03-30 ENCOUNTER — Ambulatory Visit: Payer: PPO | Attending: Pain Medicine | Admitting: Pain Medicine

## 2020-03-30 ENCOUNTER — Encounter: Payer: Self-pay | Admitting: Pain Medicine

## 2020-03-30 ENCOUNTER — Other Ambulatory Visit: Payer: Self-pay

## 2020-03-30 VITALS — BP 137/68 | HR 79 | Temp 97.0°F | Resp 16 | Ht 68.0 in | Wt 150.0 lb

## 2020-03-30 DIAGNOSIS — M5442 Lumbago with sciatica, left side: Secondary | ICD-10-CM

## 2020-03-30 DIAGNOSIS — M79604 Pain in right leg: Secondary | ICD-10-CM | POA: Insufficient documentation

## 2020-03-30 DIAGNOSIS — G893 Neoplasm related pain (acute) (chronic): Secondary | ICD-10-CM | POA: Diagnosis not present

## 2020-03-30 DIAGNOSIS — Z451 Encounter for adjustment and management of infusion pump: Secondary | ICD-10-CM | POA: Diagnosis not present

## 2020-03-30 DIAGNOSIS — M79605 Pain in left leg: Secondary | ICD-10-CM | POA: Diagnosis not present

## 2020-03-30 DIAGNOSIS — Z79891 Long term (current) use of opiate analgesic: Secondary | ICD-10-CM

## 2020-03-30 DIAGNOSIS — Z981 Arthrodesis status: Secondary | ICD-10-CM | POA: Diagnosis not present

## 2020-03-30 DIAGNOSIS — G8929 Other chronic pain: Secondary | ICD-10-CM

## 2020-03-30 DIAGNOSIS — Z79899 Other long term (current) drug therapy: Secondary | ICD-10-CM

## 2020-03-30 DIAGNOSIS — F1120 Opioid dependence, uncomplicated: Secondary | ICD-10-CM

## 2020-03-30 DIAGNOSIS — M961 Postlaminectomy syndrome, not elsewhere classified: Secondary | ICD-10-CM

## 2020-03-30 DIAGNOSIS — G894 Chronic pain syndrome: Secondary | ICD-10-CM

## 2020-03-30 DIAGNOSIS — M5030 Other cervical disc degeneration, unspecified cervical region: Secondary | ICD-10-CM

## 2020-03-30 DIAGNOSIS — F1190 Opioid use, unspecified, uncomplicated: Secondary | ICD-10-CM

## 2020-03-30 DIAGNOSIS — M542 Cervicalgia: Secondary | ICD-10-CM

## 2020-03-30 DIAGNOSIS — M7918 Myalgia, other site: Secondary | ICD-10-CM

## 2020-03-30 DIAGNOSIS — Z95828 Presence of other vascular implants and grafts: Secondary | ICD-10-CM

## 2020-03-30 DIAGNOSIS — Z9889 Other specified postprocedural states: Secondary | ICD-10-CM

## 2020-03-30 DIAGNOSIS — M5136 Other intervertebral disc degeneration, lumbar region: Secondary | ICD-10-CM | POA: Diagnosis not present

## 2020-03-30 MED ORDER — NALOXONE HCL 2 MG/2ML IJ SOSY: 1.0000 mg | PREFILLED_SYRINGE | INTRAMUSCULAR | 1 refills | Status: AC | PRN

## 2020-03-30 MED ORDER — PREDNISONE 20 MG PO TABS: ORAL_TABLET | ORAL | 0 refills | Status: AC

## 2020-03-30 MED ORDER — PAIN MANAGEMENT IT PUMP REFILL: 1.0000 | Freq: Once | INTRATHECAL | 0 refills | Status: AC

## 2020-03-30 MED ORDER — OXYCODONE HCL 10 MG PO TABS: 10.0000 mg | ORAL_TABLET | Freq: Four times a day (QID) | ORAL | 0 refills | Status: DC | PRN

## 2020-03-30 MED ORDER — OXYCODONE HCL 10 MG PO TABS
10.0000 mg | ORAL_TABLET | Freq: Four times a day (QID) | ORAL | 0 refills | Status: DC | PRN
Start: 2020-06-01 — End: 2020-06-21

## 2020-03-30 MED ORDER — OXYCODONE HCL 10 MG PO TABS
10.0000 mg | ORAL_TABLET | Freq: Four times a day (QID) | ORAL | 0 refills | Status: DC | PRN
Start: 2020-05-02 — End: 2020-06-21

## 2020-03-30 MED ORDER — METHOCARBAMOL 500 MG PO TABS: 500.0000 mg | ORAL_TABLET | Freq: Three times a day (TID) | ORAL | 2 refills | Status: AC | PRN

## 2020-03-30 NOTE — Patient Instructions (Signed)
Opioid Overdose Opioids are drugs that are often used to treat pain. Opioids include illegal drugs, such as heroin, as well as prescription pain medicines, such as codeine, morphine, hydrocodone, oxycodone, and fentanyl. An opioid overdose happens when you take too much of an opioid. An overdose may be intentional or accidental and can happen with any type of opioid. The effects of an overdose can be mild, dangerous, or even deadly. Opioid overdose is a medical emergency. What are the causes? This condition may be caused by:  Taking too much of an opioid on purpose.  Taking too much of an opioid by accident.  Using two or more substances that contain opioids at the same time.  Taking an opioid with a substance that affects your heart, breathing, or blood pressure. These include alcohol, tranquilizers, sleeping pills, illegal drugs, and some over-the-counter medicines. This condition may also happen due to an error made by:  A health care provider who prescribes a medicine.  The pharmacist who fills the prescription order. What increases the risk? This condition is more likely in:  Children. They may be attracted to colorful pills. Because of a child's small size, even a small amount of a drug can be dangerous.  Older people. They may be taking many different drugs. Older people may have difficulty reading labels or remembering when they last took their medicine. They may also be more sensitive to the effects of opioids.  People with chronic medical conditions, especially heart, liver, kidney, or neurological diseases.  People who take an opioid for a long period of time.  People who use: ? Illegal drugs. IV heroin is especially dangerous. ? Other substances, including alcohol, while using an opioid.  People who have: ? A history of drug or alcohol abuse. ? Certain mental health conditions. ? A history of previous drug overdoses.  People who take opioids that are not prescribed  for them. What are the signs or symptoms? Symptoms of this condition depend on the type of opioid and the amount that was taken. Common symptoms include:  Sleepiness or difficulty waking from sleep.  Decrease in attention.  Confusion.  Slurred speech.  Slowed breathing and a slow pulse (bradycardia).  Nausea and vomiting.  Abnormally small pupils. Signs and symptoms that require emergency treatment include:  Cold, clammy, and pale skin.  Blue lips and fingernails.  Vomiting.  Gurgling sounds in the throat.  A pulse that is very slow or difficult to detect.  Breathing that is very irregular, slow, noisy, or difficult to detect.  Limp body.  Inability to respond to speech or be awakened from sleep (stupor).  Seizures. How is this diagnosed? This condition is diagnosed based on your symptoms and medical history. It is important to tell your health care provider:  About all of the opioids that you took.  When you took the opioids.  Whether you were drinking alcohol or using marijuana, cocaine, or other drugs. Your health care provider will do a physical exam. This exam may include:  Checking and monitoring your heart rate and rhythm, breathing rate, temperature, and blood pressure (vital signs).  Measuring oxygen levels in your blood.  Checking for abnormally small pupils. You may also have blood tests or urine tests. You may have X-rays if you are having severe breathing problems. How is this treated? This condition requires immediate medical treatment and hospitalization. Treatment is given in the hospital intensive care (ICU) setting. Supporting your vital signs and your breathing is the first step in  treating an opioid overdose. Treatment may also include:  Giving salts and minerals (electrolytes) along with fluids through an IV.  Inserting a breathing tube (endotracheal tube) in your airway to help you breathe if you cannot breathe on your own or you are in  danger of not being able to breathe on your own.  Giving oxygen through a small tube under your nose.  Passing a tube through your nose and into your stomach (nasogastric tube, or NG tube) to empty your stomach.  Giving medicines that: ? Increase your blood pressure. ? Relieve nausea and vomiting. ? Relieve abdominal pain and cramping. ? Reverse the effects of the opioid (naloxone).  Monitoring your heart and oxygen levels.  Ongoing counseling and mental health support if you intentionally overdosed or used an illegal drug. Follow these instructions at home: Medicines  Take over-the-counter and prescription medicines only as told by your health care provider.  Always ask your health care provider about possible side effects and interactions of any new medicine that you start taking.  Keep a list of all the medicines that you take, including over-the-counter medicines. Bring this list with you to all your medical visits. General instructions  Drink enough fluid to keep your urine pale yellow.  Keep all follow-up visits as told by your health care provider. This is important.   How is this prevented?  Read the drug inserts that come with your opioid pain medicines.  Take medicines only as told by your health care provider. Do not take more medicine than you are told. Do not take medicines more frequently than you are told.  Do not drink alcohol or take sedatives when taking opioids.  Do not use illegal or recreational drugs, including cocaine, ecstasy, and marijuana.  Do not take opioid medicines that are not prescribed for you.  Store all medicines in safety containers that are out of the reach of children.  Get help if you are struggling with: ? Alcohol or drug use. ? Depression or another mental health problem. ? Thoughts of hurting yourself or another person.  Keep the phone number of your local poison control center near your phone or in your mobile phone. In the  [place] the hotline of the [facility] is (956) 271-2532.  If you were prescribed naloxone, make sure you understand how to take it. Contact a health care provider if you:  Need help understanding how to take your pain medicines.  Feel your medicines are too strong.  Are concerned that your pain medicines are not working well for your pain.  Develop new symptoms or side effects when you are taking medicines. Get help right away if:  You or someone else is having symptoms of an opioid overdose. Get help even if you are not sure.  You have serious thoughts about hurting yourself or others.  You have: ? Chest pain. ? Difficulty breathing. ? A loss of consciousness. These symptoms may represent a serious problem that is an emergency. Do not wait to see if the symptoms will go away. Get medical help right away. Call your local emergency services (911 in the [place] Do not drive yourself to the hospital. If you ever feel like you may hurt yourself or others, or have thoughts about taking your own life, get help right away. You can go to your nearest emergency department or call:  Your local emergency services (911 in the [place]  A suicide crisis helpline, such as the [place]  at 367-534-8795. This is open 24 hours a day. Summary  Opioids are drugs that are often used to treat pain. Opioids include illegal drugs, such as heroin, as well as prescription pain medicines.  An opioid overdose happens when you take too much of an opioid.  Overdoses can be intentional or accidental.  Opioid overdose is very dangerous. It is a life-threatening emergency.  If you or someone you know is experiencing an opioid overdose, get help right away. This information is not intended to replace advice given to you by your health care provider. Make sure you discuss any questions you have with your health care provider. Document Revised: 01/08/2018 Document  Reviewed: 01/08/2018 [employer] Patient Education  2021 [employer].

## 2020-03-30 NOTE — Progress Notes (Signed)
PROVIDER NOTE: Information contained herein reflects review and annotations entered in association with encounter. Interpretation of such information and data should be left to medically-trained personnel. Information provided to patient can be located elsewhere in the medical record under "Patient Instructions". Document created using STT-dictation technology, any transcriptional errors that may result from process are unintentional.    Patient: Darrell White  Service Category: Procedure  Provider: Gaspar Cola, MD  DOB: March 07, 1950  DOS: 03/30/2020  Location: [facility]  MRN: [id number]  Setting: Ambulatory - outpatient  Referring Provider: [name], MD  Type: Established Patient  Specialty: Interventional Pain Management  PCP: [name], MD   Primary Reason for Visit: Interventional Pain Management Treatment. CC: Back Pain  Procedure:          Intrathecal Drug Delivery System (IDDS):  Type: Reservoir Refill [phone]) No rate change Region: Abdominal Laterality: Right  Type of Pump: Medtronic Synchromed II Delivery Route: Intrathecal Type of Pain Treated: Neuropathic/Nociceptive Primary Medication Class: Opioid/opiate  Medication, Concentration, Infusion Program, & Delivery Rate: Please see scanned programming printout.   Indications: 1. Chronic pain syndrome   2. Failed back surgical syndrome   3. Chronic low back pain (1ry area of Pain) (Bilateral) (R>L)   4. Chronic lower extremity pain (2ry area of Pain) (Bilateral) (R>L)   5. DDD (degenerative disc disease), lumbar   6. Cancer-related pain   7. Pharmacologic therapy   8. Uncomplicated opioid dependence ([place])   9. Presence of implanted infusion pump   10. Adjustment and management of infusion pump    Pain Assessment: Self-Reported Pain Score: 10-Worst pain ever/10             Reported level is compatible with observation.         RTCB: 07/01/2020 Nonopioids transferred 03/30/2020:  Robaxin  The patient comes to the clinic today indicating that he is experiencing an increase in the pain in the cervical region and upper back.  He denies any upper extremity pain.  He does have a prior history of cervical fusion.  He refers that every so often he will have a flareup.  The last time we did any imaging of the cervical spine was in 2018.  Today we will go ahead and sent to the pharmacy a prescription for a steroid taper.  If any chance it continues, we will have to repeat his cervical x-rays and perhaps consider a cervical epidural steroid injection.  He understood and agree.  Pharmacotherapy Assessment  Analgesic: Oxycodone IR 10 mg, 1 tabl PO q 6 hrs (40 mg/day of oxycodone) MME/day: 60 mg/day.   Monitoring: [place] PMP: PDMP reviewed during this encounter.       Pharmacotherapy: No side-effects or adverse reactions reported. Compliance: No problems identified. Effectiveness: Clinically acceptable. Plan: Refer to "POC".  UDS:  Summary  Date Value Ref Range Status  03/26/2018 FINAL  Final    Comment:    ==================================================================== TOXASSURE SELECT 13 (MW) ==================================================================== Test                             Result       Flag       Units Drug Present and Declared for Prescription Verification   Oxycodone                      1772         EXPECTED   ng/mg creat  Oxymorphone                    >4115        EXPECTED   ng/mg creat   Noroxycodone                   >4115        EXPECTED   ng/mg creat   Noroxymorphone                 1803         EXPECTED   ng/mg creat    Sources of oxycodone are scheduled prescription medications.    Oxymorphone, noroxycodone, and noroxymorphone are expected    metabolites of oxycodone. Oxymorphone is also available as a    scheduled prescription medication. Drug Present not Declared for Prescription Verification   Morphine                       1085          UNEXPECTED ng/mg creat    Potential sources of large amounts of morphine in the absence of    codeine include administration of morphine or use of heroin. ==================================================================== Test                      Result    Flag   Units      Ref Range   Creatinine              243              mg/dL      >=20 ==================================================================== Declared Medications:  The flagging and interpretation on this report are based on the  following declared medications.  Unexpected results may arise from  inaccuracies in the declared medications.  **Note: The testing scope of this panel includes these medications:  Oxycodone  **Note: The testing scope of this panel does not include following  reported medications:  Acetaminophen (Tylenol)  Atorvastatin (Lipitor)  Docusate  Gabapentin (Neurontin)  Levothyroxine  Mirtazapine  Omeprazole  Sotalol  Tiotropium  Trazodone ==================================================================== For clinical consultation, please call [phone]. ====================================================================     Intrathecal Pump Therapy Assessment  Manufacturer: Medtronic Synchromed Type: Programmable Volume: 40 mL reservoir MRI compatibility: Yes   Drug content:  Primary Medication Class:Opioid Primary Medication:PF-Morphine(20 mg/mL) Secondary Medication:PF-Bupivacaine(20 mg/mL) Other Medication:No third medication   Programming:  Type: Simple continuous. See pump readout for details.   Changes:  Medication Change: None at this point Rate Change: No change in rate  Reported side-effects or adverse reactions: None reported  Effectiveness: Described as relatively effective, allowing for increase in activities of daily living (ADL) Clinically meaningful improvement in function (CMIF): Sustained CMIF goals met  Plan: Pump refill today  Pre-op H&P  Assessment:  [name] is a 70 y.o. (year old), male patient, seen today for interventional treatment. He  has a past surgical history that includes Mouth surgery; Cervical disc surgery; Shoulder arthroscopy; Back surgery; Spinal fusion; Spine surgery; Knee arthroscopy; Cataract extraction w/PHACO (Left, 04/09/2012); Direct laryngoscopy (N/A, 03/26/2016); Excision of tongue lesion (Right, 03/26/2016); OTHER SURGICAL HISTORY; CT LUNG SCREENING (06/16/2017); Direct laryngoscopy (N/A, 05/31/2019); and Intrathecal pump implant (Right, 08/02/2019). Mr. Mamaril has a current medication list which includes the following prescription(s): amiodarone, atorvastatin, diclofenac sodium, gabapentin, levofloxacin, levothyroxine, metoprolol succinate, mirtazapine, naloxone, PAIN MANAGEMENT IT PUMP REFILL, prednisone, sotalol, tiotropium, trazodone, methocarbamol, [START ON 04/02/2020] oxycodone hcl, [START ON 05/02/2020] oxycodone hcl, and [  START ON 06/01/2020] oxycodone hcl. His primarily concern today is the Back Pain  Initial Vital Signs:  Pulse/HCG Rate: 79  Temp: (!) 97 F (36.1 C) Resp: 16 BP: 137/68 SpO2: 100 %  BMI: Estimated body mass index is 22.81 kg/m as calculated from the following:   Height as of this encounter: 5' 8" (1.727 m).   Weight as of this encounter: 150 lb (68 kg).  Risk Assessment: Allergies: Reviewed. He is allergic to nsaids.  Allergy Precautions: None required Coagulopathies: Reviewed. None identified.  Blood-thinner therapy: None at this time Active Infection(s): Reviewed. None identified. Mr. Liew is afebrile  Site Confirmation: Mr. Wishon was asked to confirm the procedure and laterality before marking the site Procedure checklist: Completed Consent: Before the procedure and under the influence of no sedative(s), amnesic(s), or anxiolytics, the patient was informed of the treatment options, risks and possible complications. To fulfill our ethical and legal obligations, as recommended by  the American Medical Association's Code of Ethics, I have informed the patient of my clinical impression; the nature and purpose of the treatment or procedure; the risks, benefits, and possible complications of the intervention; the alternatives, including doing nothing; the risk(s) and benefit(s) of the alternative treatment(s) or procedure(s); and the risk(s) and benefit(s) of doing nothing.  Mr. Goodchild was provided with information about the general risks and possible complications associated with most interventional procedures. These include, but are not limited to: failure to achieve desired goals, infection, bleeding, organ or nerve damage, allergic reactions, paralysis, and/or death.  In addition, he was informed of those risks and possible complications associated to this particular procedure, which include, but are not limited to: damage to the implant; failure to decrease pain; local, systemic, or serious CNS infections, intraspinal abscess with possible cord compression and paralysis, or life-threatening such as meningitis; bleeding; organ damage; nerve injury or damage with subsequent sensory, motor, and/or autonomic system dysfunction, resulting in transient or permanent pain, numbness, and/or weakness of one or several areas of the body; allergic reactions, either minor or major life-threatening, such as anaphylactic or anaphylactoid reactions.  Furthermore, Mr. Scheurich was informed of those risks and complications associated with the medications. These include, but are not limited to: allergic reactions (i.e.: anaphylactic or anaphylactoid reactions); endorphine suppression; bradycardia and/or hypotension; water retention and/or peripheral vascular relaxation leading to lower extremity edema and possible stasis ulcers; respiratory depression and/or shortness of breath; decreased metabolic rate leading to weight gain; swelling or edema; medication-induced neural toxicity; particulate matter embolism  and blood vessel occlusion with resultant organ, and/or nervous system infarction; and/or intrathecal granuloma formation with possible spinal cord compression and permanent paralysis.  Before refilling the pump Mr. Biello was informed that some of the medications used in the devise may not be FDA approved for such use and therefore it constitutes an off-label use of the medications.  Finally, he was informed that Medicine is not an exact science; therefore, there is also the possibility of unforeseen or unpredictable risks and/or possible complications that may result in a catastrophic outcome. The patient indicated having understood very clearly. We have given the patient no guarantees and we have made no promises. Enough time was given to the patient to ask questions, all of which were answered to the patient's satisfaction. Mr. Scoggin has indicated that he wanted to continue with the procedure. Attestation: I, the ordering provider, attest that I have discussed with the patient the benefits, risks, side-effects, alternatives, likelihood of achieving goals, and potential problems during recovery  for the procedure that I have provided informed consent. Date  Time: 03/30/2020  1:25 PM  Pre-Procedure Preparation:  Monitoring: As per clinic protocol. Respiration, ETCO2, SpO2, BP, heart rate and rhythm monitor placed and checked for adequate function Safety Precautions: Patient was assessed for positional comfort and pressure points before starting the procedure. Time-out: I initiated and conducted the "Time-out" before starting the procedure, as per protocol. The patient was asked to participate by confirming the accuracy of the "Time Out" information. Verification of the correct person, site, and procedure were performed and confirmed by me, the nursing staff, and the patient. "Time-out" conducted as per Joint Commission's Universal Protocol (UP.01.01.01). Time: 1338  Description of Procedure:           Position: Supine Target Area: Central-port of intrathecal pump. Approach: Anterior, 90 degree angle approach. Area Prepped: Entire Area around the pump implant. DuraPrep (Iodine Povacrylex [0.7% available iodine] and Isopropyl Alcohol, 74% w/w) Safety Precautions: Aspiration looking for blood return was conducted prior to all injections. At no point did we inject any substances, as a needle was being advanced. No attempts were made at seeking any paresthesias. Safe injection practices and needle disposal techniques used. Medications properly checked for expiration dates. SDV (single dose vial) medications used. Description of the Procedure: Protocol guidelines were followed. Two nurses trained to do implant refills were present during the entire procedure. The refill medication was checked by both healthcare providers as well as the patient. The patient was included in the "Time-out" to verify the medication. The patient was placed in position. The pump was identified. The area was prepped in the usual manner. The sterile template was positioned over the pump, making sure the side-port location matched that of the pump. Both, the pump and the template were held for stability. The needle provided in the Medtronic Kit was then introduced thru the center of the template and into the central port. The pump content was aspirated and discarded volume documented. The new medication was slowly infused into the pump, thru the filter, making sure to avoid overpressure of the device. The needle was then removed and the area cleansed, making sure to leave some of the prepping solution back to take advantage of its long term bactericidal properties. The pump was interrogated and programmed to reflect the correct medication, volume, and dosage. The program was printed and taken to the physician for approval. Once checked and signed by the physician, a copy was provided to the patient and another scanned into the  EMR. Vitals:   03/30/20 1324  BP: 137/68  Pulse: 79  Resp: 16  Temp: (!) 97 F (36.1 C)  TempSrc: Temporal  SpO2: 100%  Weight: 150 lb (68 kg)  Height: 5' 8" (1.727 m)    Start Time: 1346 hrs. End Time: 1351 hrs. Materials & Medications: Medtronic Refill Kit Medication(s): Please see chart orders for details.  Imaging Guidance:          Type of Imaging Technique: None used Indication(s): N/A Exposure Time: No patient exposure Contrast: None used. Fluoroscopic Guidance: N/A Ultrasound Guidance: N/A Interpretation: N/A  Antibiotic Prophylaxis:   Anti-infectives (From admission, onward)   None     Indication(s): None identified  Post-operative Assessment:  Post-procedure Vital Signs:  Pulse/HCG Rate: 79  Temp: (!) 97 F (36.1 C) Resp: 16 BP: 137/68 SpO2: 100 %  EBL: None  Complications: No immediate post-treatment complications observed by team, or reported by patient.  Note: The patient tolerated the entire procedure  well. A repeat set of vitals were taken after the procedure and the patient was kept under observation following institutional policy, for this type of procedure. Post-procedural neurological assessment was performed, showing return to baseline, prior to discharge. The patient was provided with post-procedure discharge instructions, including a section on how to identify potential problems. Should any problems arise concerning this procedure, the patient was given instructions to immediately contact us, at any time, without hesitation. In any case, we plan to contact the patient by telephone for a follow-up status report regarding this interventional procedure.  Comments:  No additional relevant information.  Plan of Care  Orders:  Orders Placed This Encounter  Procedures  . PUMP REFILL    Maintain Protocol by having two(2) healthcare providers during procedure and programming.    Scheduling Instructions:     Please refill intrathecal pump today.     Order Specific Question:   Where will this procedure be performed?    Answer:   [facility]  . PUMP REFILL    Whenever possible schedule on a procedure today.    Standing Status:   Future    Standing Expiration Date:   08/27/2020    Scheduling Instructions:     Please schedule intrathecal pump refill based on pump programming. Avoid schedule intervals of more than 120 days (4 months).    Order Specific Question:   Where will this procedure be performed?    Answer:   [facility]  . Informed Consent Details: Physician/Practitioner Attestation; Transcribe to consent form and obtain patient signature    Transcribe to consent form and obtain patient signature.    Order Specific Question:   Physician/Practitioner attestation of informed consent for procedure/surgical case    Answer:   I, the physician/practitioner, attest that I have discussed with the patient the benefits, risks, side effects, alternatives, likelihood of achieving goals and potential problems during recovery for the procedure that I have provided informed consent.    Order Specific Question:   Procedure    Answer:   Intrathecal pump refill    Order Specific Question:   Physician/Practitioner performing the procedure    Answer:   Attending Physician: Kathlen Brunswick. Dossie Arbour, MD & designated trained staff    Order Specific Question:   Indication/Reason    Answer:   Chronic Pain Syndrome (G89.4), presence of an intrathecal pump (Z97.8)   Chronic Opioid Analgesic:  Oxycodone IR 10 mg, 1 tabl PO q 6 hrs (40 mg/day of oxycodone) MME/day: 60 mg/day.   Medications ordered for procedure: Meds ordered this encounter  Medications  . Oxycodone HCl 10 MG TABS    Sig: Take 1 tablet (10 mg total) by mouth every 6 (six) hours as needed. Must last 30 days    Dispense:  120 tablet    Refill:  0    Chronic Pain: STOP Act (Not applicable) Fill 1 day early if closed on refill date. Avoid benzodiazepines within 8 hours of  opioids  . Oxycodone HCl 10 MG TABS    Sig: Take 1 tablet (10 mg total) by mouth every 6 (six) hours as needed. Must last 30 days    Dispense:  120 tablet    Refill:  0    Chronic Pain: STOP Act (Not applicable) Fill 1 day early if closed on refill date. Avoid benzodiazepines within 8 hours of opioids  . Oxycodone HCl 10 MG TABS    Sig: Take 1 tablet (10 mg total) by mouth every 6 (six)  hours as needed. Must last 30 days    Dispense:  120 tablet    Refill:  0    Chronic Pain: STOP Act (Not applicable) Fill 1 day early if closed on refill date. Avoid benzodiazepines within 8 hours of opioids  . methocarbamol (ROBAXIN) 500 MG tablet    Sig: Take 1 tablet (500 mg total) by mouth every 8 (eight) hours as needed for muscle spasms.    Dispense:  90 tablet    Refill:  2    Fill 1 day early if pharmacy is closed on scheduled refill date. Generic permitted. Void old duplicate prescription / refill. Do not send renewal requests.  . naloxone (NARCAN) 2 MG/2ML injection    Sig: Inject 1 mL (1 mg total) into the muscle as needed for up to 2 doses (for opioid overdose). Inject content of syringe into thigh muscle. Call 911.    Dispense:  2 mL    Refill:  1    Please teach proper emergency use of device.  . predniSONE (DELTASONE) 20 MG tablet    Sig: Take 3 tablets (60 mg total) by mouth daily with breakfast for 3 days, THEN 2 tablets (40 mg total) daily with breakfast for 3 days, THEN 1 tablet (20 mg total) daily with breakfast for 3 days.    Dispense:  18 tablet    Refill:  0   Medications administered: Darrell White had no medications administered during this visit.  See the medical record for exact dosing, route, and time of administration.  Follow-up plan:   Return for Pump Refill (Max:57mo.       Interventional treatment options: Planned, scheduled, and/or pending:   Palliative intrathecal pump management    Under consideration:   Diagnostic bilateral IA hip joint  injection Diagnostic bilateral lumbar facet block#1 Possible bilateral lumbar facet RFA Diagnostic caudal ESI + diagnostic epidurogram Possible Racz procedure Diagnostic left cervical ESI Diagnosticleft cervical facet block Possible left cervical facet RFA   Therapeutic/palliative (PRN):   Diagnostic left IA shoulder (glenohumeral and AC joint) injection #2 (100/100/100/100) Therapeutic/palliative intrathecal pump refill(s)     Recent Visits Date Type Provider Dept  01/06/20 Telemedicine NMilinda Pointer MD [facility]  Showing recent visits within past 90 days and meeting all other requirements Today's Visits Date Type Provider Dept  03/30/20 Procedure visit NMilinda Pointer MD [facility]  Showing today's visits and meeting all other requirements Future Appointments Date Type Provider Dept  06/22/20 Appointment NMilinda Pointer MD [facility]  Showing future appointments within next 90 days and meeting all other requirements  Disposition: Discharge home  Discharge (Date  Time): 03/30/2020; 1400 hrs.   Primary Care Physician: SManon Hilding [facility] Note by: FGaspar Cola MD Date: 03/30/2020; Time: 2:15 PM  Disclaimer:  Medicine is not an eChief Strategy Officer The only guarantee in medicine is that nothing is guaranteed. It is important to note that the decision to proceed with this intervention was based on the information collected from the patient. The Data and conclusions were drawn from the patient's questionnaire, the interview, and the physical examination. Because the information was provided in large part by the patient, it cannot be guaranteed that it has not been purposely or unconsciously manipulated. Every effort has been made to obtain as much relevant data as possible for this evaluation. It is important to note that the conclusions that lead to this procedure are derived  in large part  from the available data. Always take into account that the treatment will also be dependent on availability of resources and existing treatment guidelines, considered by other Pain Management Practitioners as being common knowledge and practice, at the time of the intervention. For Medico-Legal purposes, it is also important to point out that variation in procedural techniques and pharmacological choices are the acceptable norm. The indications, contraindications, technique, and results of the above procedure should only be interpreted and judged by a Board-Certified Interventional Pain Specialist with extensive familiarity and expertise in the same exact procedure and technique.

## 2020-03-30 NOTE — Progress Notes (Signed)
Safety precautions to be maintained throughout the outpatient stay will include: orient to surroundings, keep bed in low position, maintain call bell within reach at all times, provide assistance with transfer out of bed and ambulation.  

## 2020-03-31 ENCOUNTER — Telehealth: Payer: Self-pay | Admitting: *Deleted

## 2020-03-31 NOTE — Telephone Encounter (Signed)
Spoke with patient re; intrathecal pump refill on yesterday.  No questions or concerns.

## 2020-04-03 DIAGNOSIS — E7849 Other hyperlipidemia: Secondary | ICD-10-CM | POA: Diagnosis not present

## 2020-04-03 DIAGNOSIS — M1612 Unilateral primary osteoarthritis, left hip: Secondary | ICD-10-CM | POA: Diagnosis not present

## 2020-04-03 DIAGNOSIS — E039 Hypothyroidism, unspecified: Secondary | ICD-10-CM | POA: Diagnosis not present

## 2020-04-03 DIAGNOSIS — I10 Essential (primary) hypertension: Secondary | ICD-10-CM | POA: Diagnosis not present

## 2020-04-12 ENCOUNTER — Other Ambulatory Visit (INDEPENDENT_AMBULATORY_CARE_PROVIDER_SITE_OTHER): Payer: Self-pay | Admitting: Otolaryngology

## 2020-04-12 DIAGNOSIS — C021 Malignant neoplasm of border of tongue: Secondary | ICD-10-CM | POA: Diagnosis not present

## 2020-04-12 DIAGNOSIS — R490 Dysphonia: Secondary | ICD-10-CM | POA: Diagnosis not present

## 2020-04-12 DIAGNOSIS — R070 Pain in throat: Secondary | ICD-10-CM | POA: Diagnosis not present

## 2020-04-12 DIAGNOSIS — Z85818 Personal history of malignant neoplasm of other sites of lip, oral cavity, and pharynx: Secondary | ICD-10-CM | POA: Diagnosis not present

## 2020-04-12 DIAGNOSIS — D3709 Neoplasm of uncertain behavior of other specified sites of the oral cavity: Secondary | ICD-10-CM | POA: Diagnosis not present

## 2020-04-19 ENCOUNTER — Other Ambulatory Visit: Payer: Self-pay

## 2020-04-19 ENCOUNTER — Encounter (HOSPITAL_COMMUNITY): Payer: Self-pay

## 2020-04-19 ENCOUNTER — Ambulatory Visit (INDEPENDENT_AMBULATORY_CARE_PROVIDER_SITE_OTHER): Payer: PPO | Admitting: Urology

## 2020-04-19 ENCOUNTER — Ambulatory Visit (HOSPITAL_COMMUNITY)
Admission: RE | Admit: 2020-04-19 | Discharge: 2020-04-19 | Disposition: A | Discharge status: Home / Self Care | Payer: PPO | Source: Ambulatory Visit | Attending: Urology | Admitting: Urology

## 2020-04-19 ENCOUNTER — Other Ambulatory Visit: Payer: Self-pay | Admitting: Urology

## 2020-04-19 DIAGNOSIS — R972 Elevated prostate specific antigen [PSA]: Secondary | ICD-10-CM

## 2020-04-19 DIAGNOSIS — C61 Malignant neoplasm of prostate: Secondary | ICD-10-CM | POA: Insufficient documentation

## 2020-04-19 MED ORDER — GENTAMICIN SULFATE 40 MG/ML IJ SOLN: 80.0000 mg | Freq: Once | INTRAMUSCULAR | Status: AC

## 2020-04-19 MED ORDER — GENTAMICIN SULFATE 40 MG/ML IJ SOLN
INTRAMUSCULAR | Status: AC
  Administered 2020-04-19: 80 mg via INTRAMUSCULAR
  Filled 2020-04-19: qty 2

## 2020-04-19 MED ORDER — LIDOCAINE HCL (PF) 2 % IJ SOLN
INTRAMUSCULAR | Status: AC
  Administered 2020-04-19: 10 mL
  Filled 2020-04-19: qty 10

## 2020-04-19 MED ORDER — LIDOCAINE HCL (PF) 2 % IJ SOLN: 10.0000 mL | Freq: Once | INTRAMUSCULAR | Status: AC

## 2020-04-19 NOTE — Sedation Documentation (Signed)
PT tolerated prostate biopsy procedure and antibiotic injection well today. Labs obtained and sent for pathology. PT ambulatory at discharge with no acute distress noted and verbalized understanding of discharge instructions. PT to follow up with urologist.

## 2020-04-19 NOTE — Discharge Instructions (Signed)
Transrectal Ultrasound-Guided Prostate Biopsy, Care After This sheet gives you information about how to care for yourself after your procedure. Your doctor may also give you more specific instructions. If you have problems or questions, contact your doctor. What can I expect after the procedure? After the procedure, it is common to have:  Pain and discomfort in your butt, especially while sitting.  Pink-colored pee (urine), due to small amounts of blood in the pee.  Burning while peeing (urinating).  Blood in your poop (stool).  Bleeding from your butt.  Blood in your semen. Follow these instructions at home: Medicines  Take over-the-counter and prescription medicines only as told by your doctor.  If you were prescribed antibiotic medicine, take it as told by your doctor. Do not stop taking the antibiotic even if you start to feel better. Activity  Do not drive for 24 hours if you were given a medicine to help you relax (sedative) during your procedure.  Return to your normal activities as told by your doctor. Ask your doctor what activities are safe for you.  Ask your doctor when it is okay for you to have sex.  Do not lift anything that is heavier than 10 lb (4.5 kg), or the limit that you are told, until your doctor says that it is safe.   General instructions  Drink enough water to keep your pee pale yellow.  Watch your pee, poop, and semen for new bleeding or bleeding that gets worse.  Keep all follow-up visits as told by your doctor. This is important.   Contact a doctor if you:  Have blood clots in your pee or poop.  Notice that your pee smells bad or unusual.  Have very bad belly pain.  Have trouble peeing.  Notice that your lower belly feels firm.  Have blood in your pee for more than 2 weeks after the procedure.  Have blood in your semen for more than 2 months after the procedure.  Have problems getting an erection.  Feel sick to your stomach  (nauseous) or throw up (vomit).  Have new or worse bleeding in your pee, poop, or semen. Get help right away if you:  Have a fever or chills.  Have bright red pee.  Have very bad pain that does not get better with medicine.  Cannot pee. Summary  After this procedure, it is common to have pain and discomfort around your butt, especially while sitting.  You may have blood in your pee and poop.  It is common to have blood in your semen for 1-2 months.  If you were prescribed antibiotic medicine, take it as told by your doctor. Do not stop taking the antibiotic even if you start to feel better.  Get help right away if you have a fever or chills. This information is not intended to replace advice given to you by your health care provider. Make sure you discuss any questions you have with your health care provider. Document Revised: 12/06/2019 Document Reviewed: 10/07/2019 Elsevier Patient Education  2021 [employer].

## 2020-04-20 MED FILL — Medication: INTRATHECAL | Qty: 1 | Status: AC

## 2020-04-21 ENCOUNTER — Other Ambulatory Visit (HOSPITAL_COMMUNITY): Payer: Self-pay | Admitting: Otolaryngology

## 2020-04-21 ENCOUNTER — Encounter: Payer: Self-pay | Admitting: Urology

## 2020-04-21 DIAGNOSIS — C01 Malignant neoplasm of base of tongue: Secondary | ICD-10-CM

## 2020-04-21 NOTE — Progress Notes (Signed)
Prostate Biopsy Procedure   Informed consent was obtained after discussing risks/benefits of the procedure.  A time out was performed to ensure correct patient identity.  Pre-Procedure: - Last PSA Level: No results found for: PSA - Gentamicin given prophylactically - Levaquin 500 mg administered PO -Transrectal Ultrasound performed revealing a 23.4 gm prostate -No significant hypoechoic or median lobe noted  Procedure: - Prostate block performed using 10 cc 1% lidocaine and biopsies taken from sextant areas, a total of 12 under ultrasound guidance.  Post-Procedure: - Patient tolerated the procedure well - He was counseled to seek immediate medical attention if experiences any severe pain, significant bleeding, or fevers - Return in one week to discuss biopsy results

## 2020-04-21 NOTE — Patient Instructions (Signed)
Transrectal Ultrasound-Guided Prostate Biopsy, Care After This sheet gives you information about how to care for yourself after your procedure. Your doctor may also give you more specific instructions. If you have problems or questions, contact your doctor. What can I expect after the procedure? After the procedure, it is common to have:  Pain and discomfort in your butt, especially while sitting.  Pink-colored pee (urine), due to small amounts of blood in the pee.  Burning while peeing (urinating).  Blood in your poop (stool).  Bleeding from your butt.  Blood in your semen. Follow these instructions at home: Medicines  Take over-the-counter and prescription medicines only as told by your doctor.  If you were prescribed antibiotic medicine, take it as told by your doctor. Do not stop taking the antibiotic even if you start to feel better. Activity  Do not drive for 24 hours if you were given a medicine to help you relax (sedative) during your procedure.  Return to your normal activities as told by your doctor. Ask your doctor what activities are safe for you.  Ask your doctor when it is okay for you to have sex.  Do not lift anything that is heavier than 10 lb (4.5 kg), or the limit that you are told, until your doctor says that it is safe.   General instructions  Drink enough water to keep your pee pale yellow.  Watch your pee, poop, and semen for new bleeding or bleeding that gets worse.  Keep all follow-up visits as told by your doctor. This is important.   Contact a doctor if you:  Have blood clots in your pee or poop.  Notice that your pee smells bad or unusual.  Have very bad belly pain.  Have trouble peeing.  Notice that your lower belly feels firm.  Have blood in your pee for more than 2 weeks after the procedure.  Have blood in your semen for more than 2 months after the procedure.  Have problems getting an erection.  Feel sick to your stomach  (nauseous) or throw up (vomit).  Have new or worse bleeding in your pee, poop, or semen. Get help right away if you:  Have a fever or chills.  Have bright red pee.  Have very bad pain that does not get better with medicine.  Cannot pee. Summary  After this procedure, it is common to have pain and discomfort around your butt, especially while sitting.  You may have blood in your pee and poop.  It is common to have blood in your semen for 1-2 months.  If you were prescribed antibiotic medicine, take it as told by your doctor. Do not stop taking the antibiotic even if you start to feel better.  Get help right away if you have a fever or chills. This information is not intended to replace advice given to you by your health care provider. Make sure you discuss any questions you have with your health care provider. Document Revised: 12/06/2019 Document Reviewed: 10/07/2019 Elsevier Patient Education  2021 [employer].

## 2020-04-28 ENCOUNTER — Encounter: Payer: Self-pay | Admitting: Urology

## 2020-04-28 ENCOUNTER — Ambulatory Visit (INDEPENDENT_AMBULATORY_CARE_PROVIDER_SITE_OTHER): Payer: PPO | Admitting: Urology

## 2020-04-28 ENCOUNTER — Ambulatory Visit: Payer: PPO | Admitting: Urology

## 2020-04-28 ENCOUNTER — Other Ambulatory Visit: Payer: Self-pay

## 2020-04-28 DIAGNOSIS — C61 Malignant neoplasm of prostate: Secondary | ICD-10-CM | POA: Insufficient documentation

## 2020-04-28 NOTE — Progress Notes (Signed)
04/28/2020 3:06 PM   Darrell White 14-Dec-1950 [id number]  Referring provider: [name], MD [place],  [place] [address]  Followup prostate biopsy  HPI: Darrell White is a 35VQ here for followup after prostate biopsy. Biopsy revealed Gleason 3+4=7 in 5/12 cores.  PSA 8.2. He has mild LUTS. He is currently being treated for cancer on his tongue.    PMH: Past Medical History:  Diagnosis Date  . Anxiety   . Arthritis   . Bronchitis 03/25/2017  . Cancer ([place])    right base of tongue  . Depression   . Dysrhythmia    AFIB  . GERD (gastroesophageal reflux disease)   . HOH (hard of hearing)   . Hypercholesteremia   . Hypertension   . Hypothyroid   . Lung cancer ([place])    right upper lobe non small cell lung cancer  . Neuropathy   . 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 BACK SURGERY     had total of 7 surgeries with rods and plates  . CATARACT EXTRACTION W/PHACO Left 04/09/2012   Procedure: CATARACT EXTRACTION PHACO AND INTRAOCULAR LENS PLACEMENT (IOC);  Surgeon: Tonny Branch, MD;  Location: [facility];  Service: Ophthalmology;  Laterality: Left;  CDE: 12.44  . CERVICAL DISC SURGERY    . CT LUNG SCREENING  06/16/2017  . DIRECT LARYNGOSCOPY N/A 03/26/2016   Procedure: DIRECT LARYNGOSCOPY;  Surgeon: Leta Baptist, MD;  Location: [facility];  Service: ENT;  Laterality: N/A;  . DIRECT LARYNGOSCOPY N/A 05/31/2019   Procedure: DIRECT LARYNGOSCOPY WITH BIOPSY;  Surgeon: Leta Baptist, MD;  Location: [facility];  Service: ENT;  Laterality: N/A;  . EXCISION OF TONGUE LESION Right 03/26/2016   Procedure: BIOPSY OF RIGHT TONGUE BASE MASS;  Surgeon: Leta Baptist, MD;  Location: [facility];  Service: ENT;  Laterality: Right;  . INTRATHECAL PUMP IMPLANT Right 08/02/2019   Procedure: REPLACEMENT RIGHT ABDOMINAL PROGRAMMABLE MORPHINE PUMP;  Surgeon: Deetta Perla, MD;  Location:  [facility];  Service: Neurosurgery;  Laterality: Right;  . KNEE ARTHROSCOPY     right knee  . MOUTH SURGERY     removal of cancer  . OTHER SURGICAL HISTORY     insertion of pain pump  . SHOULDER ARTHROSCOPY     bilateral  . SPINAL FUSION    . SPINE SURGERY     insertion of morphine pump into spine    Home Medications:  Allergies as of 04/28/2020      Reactions   Nsaids Other (See Comments)   "upset stomach" per pt      Medication List       Accurate as of April 28, 2020  3:06 PM. If you have any questions, ask your nurse or doctor.        amiodarone 200 MG tablet Commonly known as: PACERONE Take 200 mg by mouth daily.   atorvastatin 40 MG tablet Commonly known as: LIPITOR Take 40 mg by mouth daily.   diclofenac sodium 1 % Gel Commonly known as: VOLTAREN Apply 2 g topically 3 (three) times daily as needed.   gabapentin 300 MG capsule Commonly known as: NEURONTIN Take 300 mg by mouth daily as needed (pain).   levofloxacin 500 MG tablet Commonly known as: LEVAQUIN TAKE 1 TABLET BY MOUTH DAILY   levothyroxine 125 MCG tablet Commonly known as: SYNTHROID Take 125 mcg by mouth  daily before breakfast.   methocarbamol 500 MG tablet Commonly known as: ROBAXIN Take 1 tablet (500 mg total) by mouth every 8 (eight) hours as needed for muscle spasms.   metoprolol succinate 50 MG 24 hr tablet Commonly known as: TOPROL-XL Take 50 mg by mouth daily.   mirtazapine 45 MG tablet Commonly known as: REMERON Take 45 mg by mouth at bedtime.   naloxone 2 MG/2ML injection Commonly known as: NARCAN Inject 1 mL (1 mg total) into the muscle as needed for up to 2 doses (for opioid overdose). Inject content of syringe into thigh muscle. Call 911.   Oxycodone HCl 10 MG Tabs Take 1 tablet (10 mg total) by mouth every 6 (six) hours as needed. Must last 30 days   Oxycodone HCl 10 MG Tabs Take 1 tablet (10 mg total) by mouth every 6 (six) hours as needed. Must last 30 days Start  taking on: May 02, 2020   Oxycodone HCl 10 MG Tabs Take 1 tablet (10 mg total) by mouth every 6 (six) hours as needed. Must last 30 days Start taking on: June 01, 2020   sotalol 120 MG tablet Commonly known as: BETAPACE Take 120 mg by mouth daily.   tiotropium 18 MCG inhalation capsule Commonly known as: SPIRIVA Place 18 mcg into inhaler and inhale daily.   traZODone 50 MG tablet Commonly known as: DESYREL Take 200 mg by mouth at bedtime.       Allergies:  Allergies  Allergen Reactions  . Nsaids Other (See Comments)    "upset stomach" per pt    Family History: Family History  Problem Relation Age of Onset  . Cancer Father   . Cancer Brother        Prostate cancer    Social History:  reports that he has been smoking cigarettes. He has a 110.00 pack-year smoking history. He has never used smokeless tobacco. He reports that he does not drink alcohol and does not use drugs.  ROS: All other review of systems were reviewed and are negative except what is noted above in HPI  Physical Exam: There were no vitals taken for this visit.  Constitutional:  Alert and oriented, No acute distress. HEENT:  AT, moist mucus membranes.  Trachea midline, no masses. Cardiovascular: No clubbing, cyanosis, or edema. Respiratory: Normal respiratory effort, no increased work of breathing. GI: Abdomen is soft, nontender, nondistended, no abdominal masses GU: No CVA tenderness.  Lymph: No cervical or inguinal lymphadenopathy. Skin: No rashes, bruises or suspicious lesions. Neurologic: Grossly intact, no focal deficits, moving all 4 extremities. Psychiatric: Normal mood and affect.  Laboratory Data: Lab Results  Component Value Date   WBC 5.0 08/02/2019   HGB 12.3 (L) 08/02/2019   HCT 36.8 (L) 08/02/2019   MCV 91.8 08/02/2019   PLT 187 08/02/2019    Lab Results  Component Value Date   CREATININE 0.90 08/02/2019    No results found for: PSA  No results found for:  TESTOSTERONE  No results found for: HGBA1C  Urinalysis    Component Value Date/Time   APPEARANCEUR Clear 02/01/2020 1625   GLUCOSEU Negative 02/01/2020 1625   BILIRUBINUR Negative 02/01/2020 1625   PROTEINUR Negative 02/01/2020 1625   NITRITE Negative 02/01/2020 1625   LEUKOCYTESUR Negative 02/01/2020 1625    Lab Results  Component Value Date   LABMICR See below: 02/01/2020   WBCUA None seen 02/01/2020   LABEPIT None seen 02/01/2020   BACTERIA None seen 02/01/2020    Pertinent Imaging:  No results found for this or any previous visit.  No results found for this or any previous visit.  No results found for this or any previous visit.  No results found for this or any previous visit.  No results found for this or any previous visit.  No results found for this or any previous visit.  No results found for this or any previous visit.  No results found for this or any previous visit.   Assessment & Plan:    1. Prostate cancer [facility]) I discussed the natural history of favorable intermediate risk prostate cancer with the patient and the various treatment options including active surveillance, RALP, IMRT, brachytherapy, cryotherapy, HIFU and ADT. After discussing the options the patient elects for RALP. I will refer him to Dr. Tresa Moore for consideration of RALP.   No follow-ups on file.  Nicolette Bang, MD  [facility] [place]

## 2020-04-28 NOTE — Patient Instructions (Signed)
Prostate Cancer  The prostate is a male gland that helps make semen. It is located below a man's bladder, in front of the rectum. Prostate cancer is when abnormal cells grow in this gland. What are the causes? The cause of this condition is not known. What increases the risk? You are more likely to develop this condition if:  You are 69 years of age or older.  You are African American.  You have a family history of prostate cancer.  You have a family history of breast cancer. What are the signs or symptoms? Symptoms of this condition include:  A need to pee often.  Peeing that is weak, or pee that stops and starts.  Trouble starting or stopping your pee.  Inability to pee.  Blood in your pee or semen.  Pain in the lower back, lower belly (abdomen), hips, or upper thighs.  Trouble getting an erection.  Trouble emptying all of your pee. How is this treated? Treatment for this condition depends on your age, your health, the kind of treatment you like, and how far the cancer has spread. Treatments include:  Being watched. This is called observation. You will be tested from time to time, but you will not get treated. Tests are to make sure that the cancer is not growing.  Surgery. This may be done to remove the prostate, to remove the testicles, or to freeze or kill cancer cells.  Radiation. This uses a strong beam to kill cancer cells.  Ultrasound energy. This uses strong sound waves to kill cancer cells.  Chemotherapy. This uses medicines that stop cancer cells from increasing. This kills cancer cells and healthy cells.  Targeted therapy. This kills cancer cells only. Healthy cells are not affected.  Hormone treatment. This stops the body from making hormones that help the cancer cells to grow. Follow these instructions at home:  Take over-the-counter and prescription medicines only as told by your doctor.  Eat a healthy diet.  Get plenty of sleep.  Ask your  doctor for help to find a support group for men with prostate cancer.  If you have to go to the hospital, let your cancer doctor (oncologist) know.  Treatment may affect your ability to have sex. Touch, hold, hug, and caress your partner to have intimate moments.  Keep all follow-up visits as told by your doctor. This is important. Contact a doctor if:  You have new or more trouble peeing.  You have new or more blood in your pee.  You have new or more pain in your hips, back, or chest. Get help right away if:  You have weakness in your legs.  You lose feeling in your legs.  You cannot control your pee or your poop (stool).  You have chills or a fever. Summary  The prostate is a male gland that helps make semen.  Prostate cancer is when abnormal cells grow in this gland.  Treatment includes doing surgery, using medicines, using very strong beams, or watching without treatment.  Ask your doctor for help to find a support group for men with prostate cancer.  Contact a doctor if you have problems peeing or have any new pain that you did not have before. This information is not intended to replace advice given to you by your health care provider. Make sure you discuss any questions you have with your health care provider. Document Revised: 01/05/2019 Document Reviewed: 01/05/2019 Elsevier Patient Education  2021 [employer]  

## 2020-05-10 DIAGNOSIS — F17210 Nicotine dependence, cigarettes, uncomplicated: Secondary | ICD-10-CM | POA: Diagnosis not present

## 2020-05-10 DIAGNOSIS — Z79899 Other long term (current) drug therapy: Secondary | ICD-10-CM | POA: Diagnosis not present

## 2020-05-10 DIAGNOSIS — I10 Essential (primary) hypertension: Secondary | ICD-10-CM | POA: Diagnosis not present

## 2020-05-10 DIAGNOSIS — C01 Malignant neoplasm of base of tongue: Secondary | ICD-10-CM | POA: Diagnosis not present

## 2020-05-10 DIAGNOSIS — C61 Malignant neoplasm of prostate: Secondary | ICD-10-CM | POA: Diagnosis not present

## 2020-05-10 DIAGNOSIS — Z923 Personal history of irradiation: Secondary | ICD-10-CM | POA: Diagnosis not present

## 2020-05-10 DIAGNOSIS — I4891 Unspecified atrial fibrillation: Secondary | ICD-10-CM | POA: Diagnosis not present

## 2020-05-10 DIAGNOSIS — M549 Dorsalgia, unspecified: Secondary | ICD-10-CM | POA: Diagnosis not present

## 2020-05-10 DIAGNOSIS — E7800 Pure hypercholesterolemia, unspecified: Secondary | ICD-10-CM | POA: Diagnosis not present

## 2020-05-10 DIAGNOSIS — Z85118 Personal history of other malignant neoplasm of bronchus and lung: Secondary | ICD-10-CM | POA: Diagnosis not present

## 2020-05-10 DIAGNOSIS — G8929 Other chronic pain: Secondary | ICD-10-CM | POA: Diagnosis not present

## 2020-05-10 DIAGNOSIS — Z7901 Long term (current) use of anticoagulants: Secondary | ICD-10-CM | POA: Diagnosis not present

## 2020-05-10 DIAGNOSIS — Z79891 Long term (current) use of opiate analgesic: Secondary | ICD-10-CM | POA: Diagnosis not present

## 2020-05-10 DIAGNOSIS — C023 Malignant neoplasm of anterior two-thirds of tongue, part unspecified: Secondary | ICD-10-CM | POA: Diagnosis not present

## 2020-05-11 MED FILL — Medication: INTRATHECAL | Qty: 1 | Status: AC

## 2020-05-15 ENCOUNTER — Other Ambulatory Visit: Payer: Self-pay

## 2020-05-15 ENCOUNTER — Ambulatory Visit (HOSPITAL_COMMUNITY)
Admission: RE | Admit: 2020-05-15 | Discharge: 2020-05-15 | Disposition: A | Discharge status: Home / Self Care | Payer: PPO | Source: Ambulatory Visit | Attending: Otolaryngology | Admitting: Otolaryngology

## 2020-05-15 DIAGNOSIS — C01 Malignant neoplasm of base of tongue: Secondary | ICD-10-CM

## 2020-05-15 DIAGNOSIS — C760 Malignant neoplasm of head, face and neck: Secondary | ICD-10-CM | POA: Diagnosis not present

## 2020-05-15 MED ORDER — FLUDEOXYGLUCOSE F - 18 (FDG) INJECTION
10.4900 | Freq: Once | INTRAVENOUS | Status: AC | PRN
  Administered 2020-05-15: 10.49 via INTRAVENOUS

## 2020-05-16 ENCOUNTER — Telehealth: Payer: Self-pay | Admitting: Pain Medicine

## 2020-05-16 DIAGNOSIS — C61 Malignant neoplasm of prostate: Secondary | ICD-10-CM | POA: Diagnosis not present

## 2020-05-17 ENCOUNTER — Other Ambulatory Visit: Payer: Self-pay | Admitting: Pain Medicine

## 2020-05-17 DIAGNOSIS — F1123 Opioid dependence with withdrawal: Secondary | ICD-10-CM

## 2020-05-17 DIAGNOSIS — F1193 Opioid use, unspecified with withdrawal: Secondary | ICD-10-CM | POA: Insufficient documentation

## 2020-05-17 MED ORDER — CLONIDINE HCL 0.1 MG PO TABS: 0.1000 mg | ORAL_TABLET | Freq: Every day | ORAL | 0 refills | Status: AC

## 2020-05-17 NOTE — Telephone Encounter (Signed)
I am very sorry for him. I will send clonidine to pharmacy. Tell him that he should stop taking the clonidine if he begins to have symptoms of orthostatic hypotension. (Getting dizzy when standing up).

## 2020-05-17 NOTE — Progress Notes (Signed)
Patient called indicating that he is withdrawing because his dog took his medicine bottle and he cannot find it.  I will be sending clonidine to the pharmacy.

## 2020-05-17 NOTE — Telephone Encounter (Signed)
Patient states his dog took his pain medicine off the coffee table and now they are unable to find them.  Does not think he ate them, would not be able to get the top off.  The whole family has been involved in trying to find the medication.  I told him that Dr Dossie Arbour does not replace lost or stolen medication.  Patients' response was how long will I be in withdrawals?  He reports sweating and tremors and whole lot of pain.

## 2020-05-17 NOTE — Telephone Encounter (Signed)
Called patient and told him that Dr Dossie Arbour sent in Clonodine 0.1 mg to be taken once per day qty 14.  I did explain the drug and its action and warned him about postural hypotension, as it could lower his blower BP.  Patient verbalizes u/o information.

## 2020-05-22 DIAGNOSIS — F17200 Nicotine dependence, unspecified, uncomplicated: Secondary | ICD-10-CM | POA: Diagnosis not present

## 2020-05-22 DIAGNOSIS — I480 Paroxysmal atrial fibrillation: Secondary | ICD-10-CM | POA: Diagnosis not present

## 2020-05-24 DIAGNOSIS — E7800 Pure hypercholesterolemia, unspecified: Secondary | ICD-10-CM | POA: Diagnosis not present

## 2020-05-24 DIAGNOSIS — C029 Malignant neoplasm of tongue, unspecified: Secondary | ICD-10-CM | POA: Diagnosis not present

## 2020-05-24 DIAGNOSIS — C028 Malignant neoplasm of overlapping sites of tongue: Secondary | ICD-10-CM | POA: Diagnosis not present

## 2020-05-24 DIAGNOSIS — R918 Other nonspecific abnormal finding of lung field: Secondary | ICD-10-CM | POA: Diagnosis not present

## 2020-05-24 DIAGNOSIS — Z923 Personal history of irradiation: Secondary | ICD-10-CM | POA: Diagnosis not present

## 2020-05-24 DIAGNOSIS — R933 Abnormal findings on diagnostic imaging of other parts of digestive tract: Secondary | ICD-10-CM | POA: Diagnosis not present

## 2020-05-25 ENCOUNTER — Telehealth: Payer: Self-pay

## 2020-05-25 DIAGNOSIS — C01 Malignant neoplasm of base of tongue: Secondary | ICD-10-CM | POA: Diagnosis not present

## 2020-05-25 NOTE — Telephone Encounter (Signed)
Dr Adella Nissen called stating that Mr Darrell White was in his office today and has a large cancerous spot on his tongue and that he didn't have any pain meds cause his dog ate his medication. Told MD that Dr Dossie Arbour would not refill meds . Dr Adella Nissen to write 1 weeks worth of Hydrocodone for him until he can get meds filled.

## 2020-05-26 ENCOUNTER — Telehealth: Payer: Self-pay

## 2020-05-26 NOTE — Telephone Encounter (Signed)
Received message from [place] at [facility] requesting permission to fill Norco 10/325 mg for this patient that he received from oncologist.  Dr Dossie Arbour notifed and he gave permission for the pharmacy to fill the [place] [facility] notified.

## 2020-05-29 ENCOUNTER — Other Ambulatory Visit: Payer: Self-pay

## 2020-05-29 MED ORDER — PAIN MANAGEMENT IT PUMP REFILL: 1.0000 | Freq: Once | INTRATHECAL | 0 refills | Status: AC

## 2020-05-30 DIAGNOSIS — E46 Unspecified protein-calorie malnutrition: Secondary | ICD-10-CM | POA: Insufficient documentation

## 2020-05-30 DIAGNOSIS — C01 Malignant neoplasm of base of tongue: Secondary | ICD-10-CM | POA: Diagnosis not present

## 2020-06-03 DIAGNOSIS — I10 Essential (primary) hypertension: Secondary | ICD-10-CM | POA: Diagnosis not present

## 2020-06-03 DIAGNOSIS — E7849 Other hyperlipidemia: Secondary | ICD-10-CM | POA: Diagnosis not present

## 2020-06-03 DIAGNOSIS — M1612 Unilateral primary osteoarthritis, left hip: Secondary | ICD-10-CM | POA: Diagnosis not present

## 2020-06-03 DIAGNOSIS — E039 Hypothyroidism, unspecified: Secondary | ICD-10-CM | POA: Diagnosis not present

## 2020-06-05 DIAGNOSIS — Z01818 Encounter for other preprocedural examination: Secondary | ICD-10-CM | POA: Diagnosis not present

## 2020-06-08 DIAGNOSIS — R471 Dysarthria and anarthria: Secondary | ICD-10-CM | POA: Diagnosis not present

## 2020-06-08 DIAGNOSIS — I480 Paroxysmal atrial fibrillation: Secondary | ICD-10-CM | POA: Insufficient documentation

## 2020-06-08 DIAGNOSIS — C029 Malignant neoplasm of tongue, unspecified: Secondary | ICD-10-CM | POA: Diagnosis not present

## 2020-06-08 DIAGNOSIS — F17210 Nicotine dependence, cigarettes, uncomplicated: Secondary | ICD-10-CM | POA: Diagnosis not present

## 2020-06-08 DIAGNOSIS — K146 Glossodynia: Secondary | ICD-10-CM | POA: Diagnosis not present

## 2020-06-08 DIAGNOSIS — Z978 Presence of other specified devices: Secondary | ICD-10-CM | POA: Insufficient documentation

## 2020-06-08 DIAGNOSIS — K219 Gastro-esophageal reflux disease without esophagitis: Secondary | ICD-10-CM | POA: Insufficient documentation

## 2020-06-08 DIAGNOSIS — R911 Solitary pulmonary nodule: Secondary | ICD-10-CM | POA: Diagnosis not present

## 2020-06-14 DIAGNOSIS — Z01818 Encounter for other preprocedural examination: Secondary | ICD-10-CM | POA: Diagnosis not present

## 2020-06-14 DIAGNOSIS — R918 Other nonspecific abnormal finding of lung field: Secondary | ICD-10-CM | POA: Diagnosis not present

## 2020-06-14 DIAGNOSIS — J432 Centrilobular emphysema: Secondary | ICD-10-CM | POA: Diagnosis not present

## 2020-06-14 DIAGNOSIS — F17210 Nicotine dependence, cigarettes, uncomplicated: Secondary | ICD-10-CM | POA: Diagnosis not present

## 2020-06-15 DIAGNOSIS — J432 Centrilobular emphysema: Secondary | ICD-10-CM | POA: Diagnosis not present

## 2020-06-16 ENCOUNTER — Ambulatory Visit: Payer: PPO | Admitting: Urology

## 2020-06-17 DIAGNOSIS — J432 Centrilobular emphysema: Secondary | ICD-10-CM | POA: Diagnosis not present

## 2020-06-17 DIAGNOSIS — F17210 Nicotine dependence, cigarettes, uncomplicated: Secondary | ICD-10-CM | POA: Diagnosis not present

## 2020-06-21 NOTE — Progress Notes (Signed)
PROVIDER NOTE: Information contained herein reflects review and annotations entered in association with encounter. Interpretation of such information and data should be left to medically-trained personnel. Information provided to patient can be located elsewhere in the medical record under "Patient Instructions". Document created using STT-dictation technology, any transcriptional errors that may result from process are unintentional.    Patient: Darrell White  Service Category: Procedure  Provider: Gaspar Cola, MD  DOB: Dec 09, 1950  DOS: 06/22/2020  Location: [facility]  MRN: [id number]  Setting: Ambulatory - outpatient  Referring Provider: [name], MD  Type: Established Patient  Specialty: Interventional Pain Management  PCP: Darrell Hilding, MD   Primary Reason for Visit: Interventional Pain Management Treatment. CC: Back Pain  Procedure:          Intrathecal Drug Delivery System (IDDS):  Type: Reservoir Refill [phone]) No rate change Region: Abdominal Laterality: Right  Type of Pump: Medtronic Synchromed II Delivery Route: Intrathecal Type of Pain Treated: Neuropathic/Nociceptive Primary Medication Class: Opioid/opiate  Medication, Concentration, Infusion Program, & Delivery Rate: Please see scanned programming printout.   Indications: 1. Chronic pain syndrome   2. Cancer-related pain   3. Prostate cancer (Darrell White)   4. Chronic low back pain (1ry area of Pain) (Bilateral) (R>L)   5. Chronic lower extremity pain (2ry area of Pain) (Bilateral) (R>L)   6. Chronic neck pain (3ry area of Pain) (Left)   7. DDD (degenerative disc disease), lumbar   8. Failed back surgical syndrome   9. Presence of implanted infusion pump   10. Presence of intrathecal pump   11. Pharmacologic therapy   12. Chronic use of opiate for therapeutic purpose   13. Uncomplicated opioid dependence ([place])    Pain Assessment: Self-Reported Pain Score: 10-Worst pain ever/10              Reported level is compatible with observation.        Pharmacotherapy Assessment  Analgesic: Oxycodone IR 10 mg, 1 tabl PO q 6 hrs (40 mg/day of oxycodone) MME/day: 60 mg/day.   Monitoring:  PMP: PDMP reviewed during this encounter.       Pharmacotherapy: No side-effects or adverse reactions reported. Compliance: No problems identified. Effectiveness: Clinically acceptable. Plan: Refer to "POC".  UDS:  Summary  Date Value Ref Range Status  03/26/2018 FINAL  Final    Comment:    ==================================================================== TOXASSURE SELECT 13 (MW) ==================================================================== Test                             Result       Flag       Units Drug Present and Declared for Prescription Verification   Oxycodone                      1772         EXPECTED   ng/mg creat   Oxymorphone                    >4115        EXPECTED   ng/mg creat   Noroxycodone                   >4115        EXPECTED   ng/mg creat   Noroxymorphone                 1803  EXPECTED   ng/mg creat    Sources of oxycodone are scheduled prescription medications.    Oxymorphone, noroxycodone, and noroxymorphone are expected    metabolites of oxycodone. Oxymorphone is also available as a    scheduled prescription medication. Drug Present not Declared for Prescription Verification   Morphine                       1085         UNEXPECTED ng/mg creat    Potential sources of large amounts of morphine in the absence of    codeine include administration of morphine or use of heroin. ==================================================================== Test                      Result    Flag   Units      Ref Range   Creatinine              243              mg/dL      >=20 ==================================================================== Declared Medications:  The flagging and interpretation on this report are based on the  following declared  medications.  Unexpected results may arise from  inaccuracies in the declared medications.  **Note: The testing scope of this panel includes these medications:  Oxycodone  **Note: The testing scope of this panel does not include following  reported medications:  Acetaminophen (Tylenol)  Atorvastatin (Lipitor)  Docusate  Gabapentin (Neurontin)  Levothyroxine  Mirtazapine  Omeprazole  Sotalol  Tiotropium  Trazodone ==================================================================== For clinical consultation, please call [phone]. ====================================================================     Intrathecal Pump Therapy Assessment  Manufacturer: Medtronic Synchromed Type: Programmable Volume: 40 mL reservoir MRI compatibility: Yes   Drug content:  Primary Medication Class:Opioid Primary Medication:PF-Morphine(20 mg/mL) Secondary Medication:PF-Bupivacaine(20 mg/mL) Other Medication:No third medication   Programming:  Type: Simple continuous. See pump readout for details.   Changes:  Medication Change: None at this point Rate Change: No change in rate  Reported side-effects or adverse reactions: None reported  Effectiveness: Described as relatively effective, allowing for increase in activities of daily living (ADL) Clinically meaningful improvement in function (CMIF): Sustained CMIF goals met  Plan: Pump refill today  Pre-op H&P Assessment:  Mr. Darrell White is a 70 y.o. (year old), male patient, seen today for interventional treatment. He  has a past surgical history that includes Mouth surgery; Cervical disc surgery; Shoulder arthroscopy; Back surgery; Spinal fusion; Spine surgery; Knee arthroscopy; Cataract extraction w/PHACO (Left, 04/09/2012); Direct laryngoscopy (N/A, 03/26/2016); Excision of tongue lesion (Right, 03/26/2016); OTHER SURGICAL HISTORY; CT LUNG SCREENING (06/16/2017); Direct laryngoscopy (N/A, 05/31/2019); and Intrathecal pump implant (Right,  08/02/2019). Mr. Darrell White has a current medication list which includes the following prescription(s): albuterol, eliquis, pantoprazole, amiodarone, atorvastatin, clonidine, diclofenac sodium, gabapentin, levofloxacin, levothyroxine, methocarbamol, metoprolol succinate, mirtazapine, naloxone, narcan, oxycodone hcl, [START ON 07/22/2020] oxycodone hcl, [START ON 08/21/2020] oxycodone hcl, sotalol, tiotropium, and trazodone. His primarily concern today is the Back Pain  Initial Vital Signs:  Pulse/HCG Rate: 67  Temp: (!) 96.9 F (36.1 C) Resp: 18 BP: 100/62 SpO2: 95 %  BMI: Estimated body mass index is 22.81 kg/m as calculated from the following:   Height as of this encounter: _0  (1.727 m).   Weight as of this encounter: 150 lb (68 kg).  Risk Assessment: Allergies: Reviewed. He is allergic to nsaids.  Allergy Precautions: None required Coagulopathies: Reviewed. None identified.  Blood-thinner therapy: None at this time Active Infection(s):  Reviewed. None identified. Mr. Koranda is afebrile  Site Confirmation: Mr. Sproule was asked to confirm the procedure and laterality before marking the site Procedure checklist: Completed Consent: Before the procedure and under the influence of no sedative(s), amnesic(s), or anxiolytics, the patient was informed of the treatment options, risks and possible complications. To fulfill our ethical and legal obligations, as recommended by the American Medical Association's Code of Ethics, I have informed the patient of my clinical impression; the nature and purpose of the treatment or procedure; the risks, benefits, and possible complications of the intervention; the alternatives, including doing nothing; the risk(s) and benefit(s) of the alternative treatment(s) or procedure(s); and the risk(s) and benefit(s) of doing nothing.  Mr. Warriner was provided with information about the general risks and possible complications associated with most interventional procedures.  These include, but are not limited to: failure to achieve desired goals, infection, bleeding, organ or nerve damage, allergic reactions, paralysis, and/or death.  In addition, he was informed of those risks and possible complications associated to this particular procedure, which include, but are not limited to: damage to the implant; failure to decrease pain; local, systemic, or serious CNS infections, intraspinal abscess with possible cord compression and paralysis, or life-threatening such as meningitis; bleeding; organ damage; nerve injury or damage with subsequent sensory, motor, and/or autonomic system dysfunction, resulting in transient or permanent pain, numbness, and/or weakness of one or several areas of the body; allergic reactions, either minor or major life-threatening, such as anaphylactic or anaphylactoid reactions.  Furthermore, Mr. Melland was informed of those risks and complications associated with the medications. These include, but are not limited to: allergic reactions (i.e.: anaphylactic or anaphylactoid reactions); endorphine suppression; bradycardia and/or hypotension; water retention and/or peripheral vascular relaxation leading to lower extremity edema and possible stasis ulcers; respiratory depression and/or shortness of breath; decreased metabolic rate leading to weight gain; swelling or edema; medication-induced neural toxicity; particulate matter embolism and blood vessel occlusion with resultant organ, and/or nervous system infarction; and/or intrathecal granuloma formation with possible spinal cord compression and permanent paralysis.  Before refilling the pump Mr. Thurman was informed that some of the medications used in the devise may not be FDA approved for such use and therefore it constitutes an off-label use of the medications.  Finally, he was informed that Medicine is not an exact science; therefore, there is also the possibility of unforeseen or unpredictable risks  and/or possible complications that may result in a catastrophic outcome. The patient indicated having understood very clearly. We have given the patient no guarantees and we have made no promises. Enough time was given to the patient to ask questions, all of which were answered to the patient's satisfaction. Mr. Lederman has indicated that he wanted to continue with the procedure. Attestation: I, the ordering provider, attest that I have discussed with the patient the benefits, risks, side-effects, alternatives, likelihood of achieving goals, and potential problems during recovery for the procedure that I have provided informed consent. Date  Time: 06/22/2020  1:12 PM  Pre-Procedure Preparation:  Monitoring: As per clinic protocol. Respiration, ETCO2, SpO2, BP, heart rate and rhythm monitor placed and checked for adequate function Safety Precautions: Patient was assessed for positional comfort and pressure points before starting the procedure. Time-out: I initiated and conducted the "Time-out" before starting the procedure, as per protocol. The patient was asked to participate by confirming the accuracy of the "Time Out" information. Verification of the correct person, site, and procedure were performed and confirmed by me, the  nursing staff, and the patient. "Time-out" conducted as per Joint Commission's Universal Protocol (UP.01.01.01). Time: 1315  Description of Procedure:          Position: Supine Target Area: Central-port of intrathecal pump. Approach: Anterior, 90 degree angle approach. Area Prepped: Entire Area around the pump implant. DuraPrep (Iodine Povacrylex [0.7% available iodine] and Isopropyl Alcohol, 74% w/w) Safety Precautions: Aspiration looking for blood return was conducted prior to all injections. At no point did we inject any substances, as a needle was being advanced. No attempts were made at seeking any paresthesias. Safe injection practices and needle disposal techniques used.  Medications properly checked for expiration dates. SDV (single dose vial) medications used. Description of the Procedure: Protocol guidelines were followed. Two nurses trained to do implant refills were present during the entire procedure. The refill medication was checked by both healthcare providers as well as the patient. The patient was included in the "Time-out" to verify the medication. The patient was placed in position. The pump was identified. The area was prepped in the usual manner. The sterile template was positioned over the pump, making sure the side-port location matched that of the pump. Both, the pump and the template were held for stability. The needle provided in the Medtronic Kit was then introduced thru the center of the template and into the central port. The pump content was aspirated and discarded volume documented. The new medication was slowly infused into the pump, thru the filter, making sure to avoid overpressure of the device. The needle was then removed and the area cleansed, making sure to leave some of the prepping solution back to take advantage of its long term bactericidal properties. The pump was interrogated and programmed to reflect the correct medication, volume, and dosage. The program was printed and taken to the physician for approval. Once checked and signed by the physician, a copy was provided to the patient and another scanned into the EMR. Vitals:   06/22/20 1311  BP: 100/62  Pulse: 67  Resp: 18  Temp: (!) 96.9 F (36.1 C)  SpO2: 95%  Weight: 150 lb (68 kg)  Height: _0  (1.727 m)    Start Time: 1330 hrs. End Time: 1347 hrs. Materials & Medications: Medtronic Refill Kit Medication(s): Please see chart orders for details.  Imaging Guidance:          Type of Imaging Technique: None used Indication(s): N/A Exposure Time: No patient exposure Contrast: None used. Fluoroscopic Guidance: N/A Ultrasound Guidance: N/A Interpretation: N/A  Antibiotic  Prophylaxis:   Anti-infectives (From admission, onward)   None     Indication(s): None identified  Post-operative Assessment:  Post-procedure Vital Signs:  Pulse/HCG Rate: 67  Temp: (!) 96.9 F (36.1 C) Resp: 18 BP: 100/62 SpO2: 95 %  EBL: None  Complications: No immediate post-treatment complications observed by team, or reported by patient.  Note: The patient tolerated the entire procedure well. A repeat set of vitals were taken after the procedure and the patient was kept under observation following institutional policy, for this type of procedure. Post-procedural neurological assessment was performed, showing return to baseline, prior to discharge. The patient was provided with post-procedure discharge instructions, including a section on how to identify potential problems. Should any problems arise concerning this procedure, the patient was given instructions to immediately contact us, at any time, without hesitation. In any case, we plan to contact the patient by telephone for a follow-up status report regarding this interventional procedure.  Comments:  No additional relevant  information.  Plan of Care  Orders:  Orders Placed This Encounter  Procedures  . PUMP REFILL    Maintain Protocol by having two(2) healthcare providers during procedure and programming.    Scheduling Instructions:     Please refill intrathecal pump today.    Order Specific Question:   Where will this procedure be performed?    Answer:   [facility]  . PUMP REFILL    Whenever possible schedule on a procedure today.    Standing Status:   Future    Standing Expiration Date:   11/19/2020    Scheduling Instructions:     Please schedule intrathecal pump refill based on pump programming. Avoid schedule intervals of more than 120 days (4 months).    Order Specific Question:   Where will this procedure be performed?    Answer:   [facility]  . Informed Consent Details:  Physician/Practitioner Attestation; Transcribe to consent form and obtain patient signature    Transcribe to consent form and obtain patient signature.    Order Specific Question:   Physician/Practitioner attestation of informed consent for procedure/surgical case    Answer:   I, the physician/practitioner, attest that I have discussed with the patient the benefits, risks, side effects, alternatives, likelihood of achieving goals and potential problems during recovery for the procedure that I have provided informed consent.    Order Specific Question:   Procedure    Answer:   Intrathecal pump refill    Order Specific Question:   Physician/Practitioner performing the procedure    Answer:   Attending Physician: Kathlen Brunswick. Dossie Arbour, MD & designated trained staff    Order Specific Question:   Indication/Reason    Answer:   Chronic Pain Syndrome (G89.4), presence of an intrathecal pump (Z97.8)   Chronic Opioid Analgesic:  Oxycodone IR 10 mg, 1 tabl PO q 6 hrs (40 mg/day of oxycodone) MME/day: 60 mg/day.   Medications ordered for procedure: Meds ordered this encounter  Medications  . Oxycodone HCl 10 MG TABS    Sig: Take 1 tablet (10 mg total) by mouth every 6 (six) hours as needed. Must last 30 days    Dispense:  120 tablet    Refill:  0    Not a duplicate. Do NOT delete! Dispense 1 day early if closed on refill date. Avoid benzodiazepines within 8 hours of opioids. Do not send refill requests.  . Oxycodone HCl 10 MG TABS    Sig: Take 1 tablet (10 mg total) by mouth every 6 (six) hours as needed. Must last 30 days    Dispense:  120 tablet    Refill:  0    Not a duplicate. Do NOT delete! Dispense 1 day early if closed on refill date. Avoid benzodiazepines within 8 hours of opioids. Do not send refill requests.  . Oxycodone HCl 10 MG TABS    Sig: Take 1 tablet (10 mg total) by mouth every 6 (six) hours as needed. Must last 30 days    Dispense:  120 tablet    Refill:  0    Not a duplicate. Do  NOT delete! Dispense 1 day early if closed on refill date. Avoid benzodiazepines within 8 hours of opioids. Do not send refill requests.   Medications administered: Darrell White had no medications administered during this visit.  See the medical record for exact dosing, route, and time of administration.  Follow-up plan:   Return for Pump Refill (Max:6mo.  Interventional treatment options: Planned, scheduled, and/or pending:   Palliative intrathecal pump management    Under consideration:   Diagnostic bilateral IA hip joint injection Diagnostic bilateral lumbar facet block#1 Possible bilateral lumbar facet RFA Diagnostic caudal ESI + diagnostic epidurogram Possible Racz procedure Diagnostic left cervical ESI Diagnosticleft cervical facet block Possible left cervical facet RFA   Therapeutic/palliative (PRN):   Diagnostic left IA shoulder (glenohumeral and AC joint) injection #2 (100/100/100/100) Therapeutic/palliative intrathecal pump refill(s)      Recent Visits Date Type Provider Dept  03/30/20 Procedure visit Milinda Pointer, MD [facility]  Showing recent visits within past 90 days and meeting all other requirements Today's Visits Date Type Provider Dept  06/22/20 Procedure visit Milinda Pointer, MD [facility]  Showing today's visits and meeting all other requirements Future Appointments No visits were found meeting these conditions. Showing future appointments within next 90 days and meeting all other requirements  Disposition: Discharge home  Discharge (Date  Time): 06/22/2020; 1410 hrs.   Primary Care Physician: Darrell Hilding, MD Location: [facility] Note by: Darrell Cola, MD Date: 06/22/2020; Time: 2:24 PM  Disclaimer:  Medicine is not an Chief Strategy Officer. The only guarantee in medicine is that nothing is guaranteed. It is important to note that the decision to proceed with this  intervention was based on the information collected from the patient. The Data and conclusions were drawn from the patient's questionnaire, the interview, and the physical examination. Because the information was provided in large part by the patient, it cannot be guaranteed that it has not been purposely or unconsciously manipulated. Every effort has been made to obtain as much relevant data as possible for this evaluation. It is important to note that the conclusions that lead to this procedure are derived in large part from the available data. Always take into account that the treatment will also be dependent on availability of resources and existing treatment guidelines, considered by other Pain Management Practitioners as being common knowledge and practice, at the time of the intervention. For Medico-Legal purposes, it is also important to point out that variation in procedural techniques and pharmacological choices are the acceptable norm. The indications, contraindications, technique, and results of the above procedure should only be interpreted and judged by a Board-Certified Interventional Pain Specialist with extensive familiarity and expertise in the same exact procedure and technique.

## 2020-06-22 ENCOUNTER — Ambulatory Visit: Payer: PPO | Attending: Pain Medicine | Admitting: Pain Medicine

## 2020-06-22 ENCOUNTER — Encounter: Payer: Self-pay | Admitting: Pain Medicine

## 2020-06-22 ENCOUNTER — Other Ambulatory Visit: Payer: Self-pay

## 2020-06-22 VITALS — BP 100/62 | HR 67 | Temp 96.9°F | Resp 18 | Ht 68.0 in | Wt 150.0 lb

## 2020-06-22 DIAGNOSIS — M961 Postlaminectomy syndrome, not elsewhere classified: Secondary | ICD-10-CM

## 2020-06-22 DIAGNOSIS — M5442 Lumbago with sciatica, left side: Secondary | ICD-10-CM | POA: Insufficient documentation

## 2020-06-22 DIAGNOSIS — M5441 Lumbago with sciatica, right side: Secondary | ICD-10-CM | POA: Insufficient documentation

## 2020-06-22 DIAGNOSIS — G893 Neoplasm related pain (acute) (chronic): Secondary | ICD-10-CM | POA: Diagnosis not present

## 2020-06-22 DIAGNOSIS — M51369 Other intervertebral disc degeneration, lumbar region without mention of lumbar back pain or lower extremity pain: Secondary | ICD-10-CM

## 2020-06-22 DIAGNOSIS — M79605 Pain in left leg: Secondary | ICD-10-CM | POA: Diagnosis not present

## 2020-06-22 DIAGNOSIS — G894 Chronic pain syndrome: Secondary | ICD-10-CM | POA: Diagnosis not present

## 2020-06-22 DIAGNOSIS — G8929 Other chronic pain: Secondary | ICD-10-CM

## 2020-06-22 DIAGNOSIS — Z79891 Long term (current) use of opiate analgesic: Secondary | ICD-10-CM | POA: Diagnosis not present

## 2020-06-22 DIAGNOSIS — Z978 Presence of other specified devices: Secondary | ICD-10-CM

## 2020-06-22 DIAGNOSIS — M79604 Pain in right leg: Secondary | ICD-10-CM | POA: Diagnosis not present

## 2020-06-22 DIAGNOSIS — M5136 Other intervertebral disc degeneration, lumbar region: Secondary | ICD-10-CM | POA: Insufficient documentation

## 2020-06-22 DIAGNOSIS — C61 Malignant neoplasm of prostate: Secondary | ICD-10-CM

## 2020-06-22 DIAGNOSIS — Z79899 Other long term (current) drug therapy: Secondary | ICD-10-CM | POA: Diagnosis not present

## 2020-06-22 DIAGNOSIS — F1120 Opioid dependence, uncomplicated: Secondary | ICD-10-CM

## 2020-06-22 DIAGNOSIS — Z95828 Presence of other vascular implants and grafts: Secondary | ICD-10-CM

## 2020-06-22 MED ORDER — OXYCODONE HCL 10 MG PO TABS: 10.0000 mg | ORAL_TABLET | Freq: Four times a day (QID) | ORAL | 0 refills | Status: DC | PRN

## 2020-06-22 NOTE — Patient Instructions (Addendum)
____________________________________________________________________________________________  Drug Holidays (Slow)  What is a "Drug Holiday"? Drug Holiday: is the name given to the period of time during which a patient stops taking a medication(s) for the purpose of eliminating tolerance to the drug.  Benefits . Improved effectiveness of opioids. . Decreased opioid dose needed to achieve benefits. . Improved pain with lesser dose.  What is tolerance? Tolerance: is the progressive decreased in effectiveness of a drug due to its repetitive use. With repetitive use, the body gets use to the medication and as a consequence, it loses its effectiveness. This is a common problem seen with opioid pain medications. As a result, a larger dose of the drug is needed to achieve the same effect that used to be obtained with a smaller dose.  How long should a "Drug Holiday" last? You should stay off of the pain medicine for at least 14 consecutive days. (2 weeks)  Should I stop the medicine "cold [place]"? No. You should always coordinate with your Pain Specialist so that he/she can provide you with the correct medication dose to make the transition as smoothly as possible.  How do I stop the medicine? Slowly. You will be instructed to decrease the daily amount of pills that you take by one (1) pill every seven (7) days. This is called a "slow downward taper" of your dose. For example: if you normally take four (4) pills per day, you will be asked to drop this dose to three (3) pills per day for seven (7) days, then to two (2) pills per day for seven (7) days, then to one (1) per day for seven (7) days, and at the end of those last seven (7) days, this is when the "Drug Holiday" would start.   Will I have withdrawals? By doing a "slow downward taper" like this one, it is unlikely that you will experience any significant withdrawal symptoms. Typically, what triggers withdrawals is the sudden stop of a high  dose opioid therapy. Withdrawals can usually be avoided by slowly decreasing the dose over a prolonged period of time. If you do not follow these instructions and decide to stop your medication abruptly, withdrawals may be possible.  What are withdrawals? Withdrawals: refers to the wide range of symptoms that occur after stopping or dramatically reducing opiate drugs after heavy and prolonged use. Withdrawal symptoms do not occur to patients that use low dose opioids, or those who take the medication sporadically. Contrary to benzodiazepine (example: Valium, Xanax, etc.) or alcohol withdrawals ("Delirium Tremens"), opioid withdrawals are not lethal. Withdrawals are the physical manifestation of the body getting rid of the excess receptors.  Expected Symptoms Early symptoms of withdrawal may include: . Agitation . Anxiety . Muscle aches . Increased tearing . Insomnia . Runny nose . Sweating . Yawning  Late symptoms of withdrawal may include: . Abdominal cramping . Diarrhea . Dilated pupils . Goose bumps . Nausea . Vomiting  Will I experience withdrawals? Due to the slow nature of the taper, it is very unlikely that you will experience any.  What is a slow taper? Taper: refers to the gradual decrease in dose.  (Last update: 08/25/2019) ____________________________________________________________________________________________    ____________________________________________________________________________________________  Medication Recommendations and Reminders  Applies to: All patients receiving prescriptions (written and/or electronic).  Medication Rules & Regulations: These rules and regulations exist for your safety and that of others. They are not flexible and neither are we. Dismissing or ignoring them will be considered "non-compliance" with medication therapy, resulting in complete  and irreversible termination of such therapy. (See document titled "Medication Rules" for  more details.) In all conscience, because of safety reasons, we cannot continue providing a therapy where the patient does not follow instructions.  Pharmacy of record:   Definition: This is the pharmacy where your electronic prescriptions will be sent.   We do not endorse any particular pharmacy, however, we have experienced problems with [facility] not securing enough medication supply for the community.  We do not restrict you in your choice of pharmacy. However, once we write for your prescriptions, we will NOT be re-sending more prescriptions to fix restricted supply problems created by your pharmacy, or your insurance.   The pharmacy listed in the electronic medical record should be the one where you want electronic prescriptions to be sent.  If you choose to change pharmacy, simply notify our nursing staff.  Recommendations:  Keep all of your pain medications in a safe place, under lock and key, even if you live alone. We will NOT replace lost, stolen, or damaged medication.  After you fill your prescription, take 1 week's worth of pills and put them away in a safe place. You should keep a separate, properly labeled bottle for this purpose. The remainder should be kept in the original bottle. Use this as your primary supply, until it runs out. Once it's gone, then you know that you have 1 week's worth of medicine, and it is time to come in for a prescription refill. If you do this correctly, it is unlikely that you will ever run out of medicine.  To make sure that the above recommendation works, it is very important that you make sure your medication refill appointments are scheduled at least 1 week before you run out of medicine. To do this in an effective manner, make sure that you do not leave the office without scheduling your next medication management appointment. Always ask the nursing staff to show you in your prescription , when your medication will be running out. Then arrange for  the receptionist to get you a return appointment, at least 7 days before you run out of medicine. Do not wait until you have 1 or 2 pills left, to come in. This is very poor planning and does not take into consideration that we may need to cancel appointments due to bad weather, sickness, or emergencies affecting our staff.  DO NOT ACCEPT A "Partial Fill": If for any reason your pharmacy does not have enough pills/tablets to completely fill or refill your prescription, do not allow for a "partial fill". The law allows the pharmacy to complete that prescription within 72 hours, without requiring a new prescription. If they do not fill the rest of your prescription within those 72 hours, you will need a separate prescription to fill the remaining amount, which we will NOT provide. If the reason for the partial fill is your insurance, you will need to talk to the pharmacist about payment alternatives for the remaining tablets, but again, DO NOT ACCEPT A PARTIAL FILL, unless you can trust your pharmacist to obtain the remainder of the pills within 72 hours.  Prescription refills and/or changes in medication(s):   Prescription refills, and/or changes in dose or medication, will be conducted only during scheduled medication management appointments. (Applies to both, written and electronic prescriptions.)  No refills on procedure days. No medication will be changed or started on procedure days. No changes, adjustments, and/or refills will be conducted on a procedure day. Doing so  will interfere with the diagnostic portion of the procedure.  No phone refills. No medications will be "called into the pharmacy".  No Fax refills.  No weekend refills.  No Holliday refills.  No after hours refills.  Remember:  Business hours are:  Monday to Thursday 8:00 AM to 4:00 PM Provider's Schedule: Milinda Pointer, MD - Appointments are:  Medication management: Monday and Wednesday 8:00 AM to 4:00 PM Procedure  day: Tuesday and Thursday 7:30 AM to 4:00 PM Gillis Santa, MD - Appointments are:  Medication management: Tuesday and Thursday 8:00 AM to 4:00 PM Procedure day: Monday and Wednesday 7:30 AM to 4:00 PM (Last update: 08/25/2019) ____________________________________________________________________________________________   ____________________________________________________________________________________________  CBD (cannabidiol) WARNING  Applicable to: All individuals currently taking or considering taking CBD (cannabidiol) and, more important, all patients taking opioid analgesic controlled substances (pain medication). (Example: oxycodone; oxymorphone; hydrocodone; hydromorphone; morphine; methadone; tramadol; tapentadol; fentanyl; buprenorphine; butorphanol; dextromethorphan; meperidine; codeine; etc.)  Legal status: CBD remains a Schedule I drug prohibited for any use. CBD is illegal with one exception. In the [place], CBD has a limited Transport planner (FDA) approval for the treatment of two specific types of epilepsy disorders. Only one CBD product has been approved by the FDA for this purpose: "Epidiolex". FDA is aware that some companies are marketing products containing cannabis and cannabis-derived compounds in ways that violate the [employer], Drug and Cosmetic Act [facility] Act) and that may put the health and safety of consumers at risk. The FDA, a Federal agency, has not enforced the CBD status since 2018.   Legality: Some manufacturers ship CBD products nationally, which is illegal. Often such products are sold online and are therefore available throughout the country. CBD is openly sold in head shops and health food stores in some states where such sales have not been explicitly legalized. Selling unapproved products with unsubstantiated therapeutic claims is not only a violation of the law, but also can put patients at risk, as these products have not been proven to  be safe or effective. Federal illegality makes it difficult to conduct research on CBD.  Reference: "FDA Regulation of Cannabis and Cannabis-Derived Products, Including Cannabidiol (CBD)" - [employer]  Warning: CBD is not FDA approved and has not undergo the same manufacturing controls as prescription drugs.  This means that the purity and safety of available CBD may be questionable. Most of the time, despite manufacturer's claims, it is contaminated with THC (delta-9-tetrahydrocannabinol - the chemical in marijuana responsible for the "HIGH").  When this is the case, the [facility] contaminant will trigger a positive urine drug screen (UDS) test for Marijuana (carboxy-THC). Because a positive UDS for any illicit substance is a violation of our medication agreement, your opioid analgesics (pain medicine) may be permanently discontinued.  MORE ABOUT CBD  General Information: CBD  is a derivative of the Marijuana (cannabis sativa) plant discovered in 17. It is one of the 113 identified substances found in Marijuana. It accounts for up to 40% of the plant's extract. As of 2018, preliminary clinical studies on CBD included research for the treatment of anxiety, movement disorders, and pain. CBD is available and consumed in multiple forms, including inhalation of smoke or vapor, as an aerosol spray, and by mouth. It may be supplied as an oil containing CBD, capsules, dried cannabis, or as a liquid solution. CBD is thought not to be as psychoactive as THC (delta-9-tetrahydrocannabinol - the chemical in marijuana responsible for the "HIGH"). Studies suggest that CBD may interact  with different biological target receptors in the body, including cannabinoid and other neurotransmitter receptors. As of 2018 the mechanism of action for its biological effects has not been determined.  Side-effects   Adverse reactions: Dry mouth, diarrhea, decreased appetite, fatigue, drowsiness, malaise, weakness, sleep disturbances, and others.  Drug interactions: CBC may interact with other medications such as blood-thinners. (Last update: 09/11/2019) ____________________________________________________________________________________________   ____________________________________________________________________________________________  Medication Rules  Purpose: To inform patients, and their family members, of our rules and regulations.  Applies to: All patients receiving prescriptions (written or electronic).  Pharmacy of record: Pharmacy where electronic prescriptions will be sent. If written prescriptions are taken to a different pharmacy, please inform the nursing staff. The pharmacy listed in the electronic medical record should be the one where you would like electronic prescriptions to be sent.  Electronic prescriptions: In compliance with the [place] (STOP) Act of 2017 (Session Lanny Cramp [id number]), effective February 04, 2018, all controlled substances must be electronically prescribed. Calling prescriptions to the pharmacy will cease to exist.  Prescription refills: Only during scheduled appointments. Applies to all prescriptions.  NOTE: The following applies primarily to controlled substances (Opioid* Pain Medications).   Type of encounter (visit): For patients receiving controlled substances, face-to-face visits are required. (Not an option or up to the patient.)  Patient's responsibilities: 1. Pain Pills: Bring all pain pills to every appointment (except for procedure appointments). 2. Pill Bottles: Bring pills in original pharmacy bottle. Always bring the newest bottle. Bring bottle, even if empty. 3. Medication refills: You are responsible for knowing and keeping track of what medications you take and those you need refilled. The day before  your appointment: write a list of all prescriptions that need to be refilled. The day of the appointment: give the list to the admitting nurse. Prescriptions will be written only during appointments. No prescriptions will be written on procedure days. If you forget a medication: it will not be "Called in", "Faxed", or "electronically sent". You will need to get another appointment to get these prescribed. No early refills. Do not call asking to have your prescription filled early. 4. Prescription Accuracy: You are responsible for carefully inspecting your prescriptions before leaving our office. Have the discharge nurse carefully go over each prescription with you, before taking them home. Make sure that your name is accurately spelled, that your address is correct. Check the name and dose of your medication to make sure it is accurate. Check the number of pills, and the written instructions to make sure they are clear and accurate. Make sure that you are given enough medication to last until your next medication refill appointment. 5. Taking Medication: Take medication as prescribed. When it comes to controlled substances, taking less pills or less frequently than prescribed is permitted and encouraged. Never take more pills than instructed. Never take medication more frequently than prescribed.  6. Inform other Doctors: Always inform, all of your healthcare providers, of all the medications you take. 7. Pain Medication from other Providers: You are not allowed to accept any additional pain medication from any other Doctor or Healthcare provider. There are two exceptions to this rule. (see below) In the event that you require additional pain medication, you are responsible for notifying us, as stated below. 8. Cough Medicine: Often these contain an opioid, such as codeine or hydrocodone. Never accept or take cough medicine containing these opioids if you are already taking an opioid* medication. The  combination may cause respiratory failure and  death. 9. Medication Agreement: You are responsible for carefully reading and following our Medication Agreement. This must be signed before receiving any prescriptions from our practice. Safely store a copy of your signed Agreement. Violations to the Agreement will result in no further prescriptions. (Additional copies of our Medication Agreement are available upon request.) 10. Laws, Rules, & Regulations: All patients are expected to follow all Federal and [employer], [employer], Rules, [employer]. Ignorance of the Laws does not constitute a valid excuse.  11. Illegal drugs and Controlled Substances: The use of illegal substances (including, but not limited to marijuana and its derivatives) and/or the illegal use of any controlled substances is strictly prohibited. Violation of this rule may result in the immediate and permanent discontinuation of any and all prescriptions being written by our practice. The use of any illegal substances is prohibited. 12. Adopted CDC guidelines & recommendations: Target dosing levels will be at or below 60 MME/day. Use of benzodiazepines** is not recommended.  Exceptions: There are only two exceptions to the rule of not receiving pain medications from other Healthcare Providers. 1. Exception #1 (Emergencies): In the event of an emergency (i.e.: accident requiring emergency care), you are allowed to receive additional pain medication. However, you are responsible for: As soon as you are able, call our office [phone], at any time of the day or night, and leave a message stating your name, the date and nature of the emergency, and the name and dose of the medication prescribed. In the event that your call is answered by a member of our staff, make sure to document and save the date, time, and the name of the person that took your information.  2. Exception #2 (Planned Surgery): In the event that you are scheduled by  another doctor or dentist to have any type of surgery or procedure, you are allowed (for a period no longer than 30 days), to receive additional pain medication, for the acute post-op pain. However, in this case, you are responsible for picking up a copy of our "Post-op Pain Management for Surgeons" handout, and giving it to your surgeon or dentist. This document is available at our office, and does not require an appointment to obtain it. Simply go to our office during business hours (Monday-Thursday from 8:00 AM to 4:00 PM) (Friday 8:00 AM to 12:00 Noon) or if you have a scheduled appointment with [place], prior to your surgery, and ask for it by name. In addition, you are responsible for: calling our office [phone], at any time of the day or night, and leaving a message stating your name, name of your surgeon, type of surgery, and date of procedure or surgery. Failure to comply with your responsibilities may result in termination of therapy involving the controlled substances.  *Opioid medications include: morphine, codeine, oxycodone, oxymorphone, hydrocodone, hydromorphone, meperidine, tramadol, tapentadol, buprenorphine, fentanyl, methadone. **Benzodiazepine medications include: diazepam (Valium), alprazolam (Xanax), clonazepam (Klonopine), lorazepam (Ativan), clorazepate (Tranxene), chlordiazepoxide (Librium), estazolam (Prosom), oxazepam (Serax), temazepam (Restoril), triazolam (Halcion) (Last updated: 01/03/2020) ____________________________________________________________________________________________    Opioid Overdose Opioids are drugs that are often used to treat pain. Opioids include illegal drugs, such as heroin, as well as prescription pain medicines, such as codeine, morphine, hydrocodone, oxycodone, and fentanyl. An opioid overdose happens when you take too much of an opioid. An overdose may be intentional or accidental and can happen with any type of opioid. The effects of an  overdose can be mild, dangerous, or even deadly. Opioid overdose is a  medical emergency. What are the causes? This condition may be caused by: Taking too much of an opioid on purpose. Taking too much of an opioid by accident. Using two or more substances that contain opioids at the same time. Taking an opioid with a substance that affects your heart, breathing, or blood pressure. These include alcohol, tranquilizers, sleeping pills, illegal drugs, and some over-the-counter medicines. This condition may also happen due to an error made by: A health care provider who prescribes a medicine. The pharmacist who fills the prescription order. What increases the risk? This condition is more likely in: Children. They may be attracted to colorful pills. Because of a child's small size, even a small amount of a drug can be dangerous. Older people. They may be taking many different drugs. Older people may have difficulty reading labels or remembering when they last took their medicine. They may also be more sensitive to the effects of opioids. People with chronic medical conditions, especially heart, liver, kidney, or neurological diseases. People who take an opioid for a long period of time. People who use: Illegal drugs. IV heroin is especially dangerous. Other substances, including alcohol, while using an opioid. People who have: A history of drug or alcohol abuse. Certain mental health conditions. A history of previous drug overdoses. People who take opioids that are not prescribed for them. What are the signs or symptoms? Symptoms of this condition depend on the type of opioid and the amount that was taken. Common symptoms include: Sleepiness or difficulty waking from sleep. Decrease in attention. Confusion. Slurred speech. Slowed breathing and a slow pulse (bradycardia). Nausea and vomiting. Abnormally small pupils. Signs and symptoms that require emergency treatment include: Cold,  clammy, and pale skin. Blue lips and fingernails. Vomiting. Gurgling sounds in the throat. A pulse that is very slow or difficult to detect. Breathing that is very irregular, slow, noisy, or difficult to detect. Limp body. Inability to respond to speech or be awakened from sleep (stupor). Seizures. How is this diagnosed? This condition is diagnosed based on your symptoms and medical history. It is important to tell your health care provider: About all of the opioids that you took. When you took the opioids. Whether you were drinking alcohol or using marijuana, cocaine, or other drugs. Your health care provider will do a physical exam. This exam may include: Checking and monitoring your heart rate and rhythm, breathing rate, temperature, and blood pressure (vital signs). Measuring oxygen levels in your blood. Checking for abnormally small pupils. You may also have blood tests or urine tests. You may have X-rays if you are having severe breathing problems. How is this treated? This condition requires immediate medical treatment and hospitalization. Treatment is given in the hospital intensive care (ICU) setting. Supporting your vital signs and your breathing is the first step in treating an opioid overdose. Treatment may also include: Giving salts and minerals (electrolytes) along with fluids through an IV. Inserting a breathing tube (endotracheal tube) in your airway to help you breathe if you cannot breathe on your own or you are in danger of not being able to breathe on your own. Giving oxygen through a small tube under your nose. Passing a tube through your nose and into your stomach (nasogastric tube, or NG tube) to empty your stomach. Giving medicines that: Increase your blood pressure. Relieve nausea and vomiting. Relieve abdominal pain and cramping. Reverse the effects of the opioid (naloxone). Monitoring your heart and oxygen levels. Ongoing counseling and mental health support  if you intentionally overdosed or used an illegal drug. Follow these instructions at home: Medicines Take over-the-counter and prescription medicines only as told by your health care provider. Always ask your health care provider about possible side effects and interactions of any new medicine that you start taking. Keep a list of all the medicines that you take, including over-the-counter medicines. Bring this list with you to all your medical visits. General instructions Drink enough fluid to keep your urine pale yellow. Keep all follow-up visits as told by your health care provider. This is important.   How is this prevented? Read the drug inserts that come with your opioid pain medicines. Take medicines only as told by your health care provider. Do not take more medicine than you are told. Do not take medicines more frequently than you are told. Do not drink alcohol or take sedatives when taking opioids. Do not use illegal or recreational drugs, including cocaine, ecstasy, and marijuana. Do not take opioid medicines that are not prescribed for you. Store all medicines in safety containers that are out of the reach of children. Get help if you are struggling with: Alcohol or drug use. Depression or another mental health problem. Thoughts of hurting yourself or another person. Keep the phone number of your local poison control center near your phone or in your mobile phone. In the [place] the hotline of the [facility] is 930-637-5380. If you were prescribed naloxone, make sure you understand how to take it. Contact a health care provider if you: Need help understanding how to take your pain medicines. Feel your medicines are too strong. Are concerned that your pain medicines are not working well for your pain. Develop new symptoms or side effects when you are taking medicines. Get help right away if: You or someone else is having symptoms of an opioid overdose. Get  help even if you are not sure. You have serious thoughts about hurting yourself or others. You have: Chest pain. Difficulty breathing. A loss of consciousness. These symptoms may represent a serious problem that is an emergency. Do not wait to see if the symptoms will go away. Get medical help right away. Call your local emergency services (911 in the [place] Do not drive yourself to the hospital. If you ever feel like you may hurt yourself or others, or have thoughts about taking your own life, get help right away. You can go to your nearest emergency department or call: Your local emergency services (911 in the [place] A suicide crisis helpline, such as the [place] at 724-835-7597. This is open 24 hours a day. Summary Opioids are drugs that are often used to treat pain. Opioids include illegal drugs, such as heroin, as well as prescription pain medicines. An opioid overdose happens when you take too much of an opioid. Overdoses can be intentional or accidental. Opioid overdose is very dangerous. It is a life-threatening emergency. If you or someone you know is experiencing an opioid overdose, get help right away. This information is not intended to replace advice given to you by your health care provider. Make sure you discuss any questions you have with your health care provider. Document Revised: 01/08/2018 Document Reviewed: 01/08/2018 Elsevier Patient Education  2021 [employer].

## 2020-06-22 NOTE — Progress Notes (Signed)
Nursing Pain Medication Assessment:  Safety precautions to be maintained throughout the outpatient stay will include: orient to surroundings, keep bed in low position, maintain call bell within reach at all times, provide assistance with transfer out of bed and ambulation.  Medication Inspection Compliance: Mr. Mysliwiec did not comply with our request to bring his pills to be counted. He was reminded that bringing the medication bottles, even when empty, is a requirement.  Medication: None brought in. Pill/Patch Count: None available to be counted. Bottle Appearance: No container available. Did not bring bottle(s) to appointment. Filled Date: N/A Last Medication intake:  Today

## 2020-06-23 ENCOUNTER — Telehealth: Payer: Self-pay

## 2020-06-23 NOTE — Telephone Encounter (Signed)
Post pump refill phone call.  Unable to leave message, voicemail has not been set up yet.

## 2020-06-26 DIAGNOSIS — F32A Depression, unspecified: Secondary | ICD-10-CM | POA: Diagnosis not present

## 2020-06-26 DIAGNOSIS — C029 Malignant neoplasm of tongue, unspecified: Secondary | ICD-10-CM | POA: Diagnosis not present

## 2020-06-26 DIAGNOSIS — I952 Hypotension due to drugs: Secondary | ICD-10-CM | POA: Diagnosis not present

## 2020-06-26 DIAGNOSIS — C321 Malignant neoplasm of supraglottis: Secondary | ICD-10-CM | POA: Diagnosis not present

## 2020-06-26 DIAGNOSIS — Z79891 Long term (current) use of opiate analgesic: Secondary | ICD-10-CM | POA: Diagnosis not present

## 2020-06-26 DIAGNOSIS — R1310 Dysphagia, unspecified: Secondary | ICD-10-CM | POA: Diagnosis not present

## 2020-06-26 DIAGNOSIS — G8929 Other chronic pain: Secondary | ICD-10-CM | POA: Diagnosis not present

## 2020-06-26 DIAGNOSIS — D62 Acute posthemorrhagic anemia: Secondary | ICD-10-CM | POA: Diagnosis not present

## 2020-06-26 DIAGNOSIS — Z923 Personal history of irradiation: Secondary | ICD-10-CM | POA: Diagnosis not present

## 2020-06-26 DIAGNOSIS — M545 Low back pain, unspecified: Secondary | ICD-10-CM | POA: Diagnosis not present

## 2020-06-26 DIAGNOSIS — Z79899 Other long term (current) drug therapy: Secondary | ICD-10-CM | POA: Diagnosis not present

## 2020-06-26 DIAGNOSIS — Z4659 Encounter for fitting and adjustment of other gastrointestinal appliance and device: Secondary | ICD-10-CM | POA: Diagnosis not present

## 2020-06-26 DIAGNOSIS — Z7982 Long term (current) use of aspirin: Secondary | ICD-10-CM | POA: Diagnosis not present

## 2020-06-26 DIAGNOSIS — T402X5A Adverse effect of other opioids, initial encounter: Secondary | ICD-10-CM | POA: Diagnosis not present

## 2020-06-26 DIAGNOSIS — Z7901 Long term (current) use of anticoagulants: Secondary | ICD-10-CM | POA: Diagnosis not present

## 2020-06-26 DIAGNOSIS — Z85118 Personal history of other malignant neoplasm of bronchus and lung: Secondary | ICD-10-CM | POA: Diagnosis not present

## 2020-06-26 DIAGNOSIS — C760 Malignant neoplasm of head, face and neck: Secondary | ICD-10-CM | POA: Diagnosis not present

## 2020-06-26 DIAGNOSIS — I509 Heart failure, unspecified: Secondary | ICD-10-CM | POA: Diagnosis not present

## 2020-06-26 DIAGNOSIS — I2510 Atherosclerotic heart disease of native coronary artery without angina pectoris: Secondary | ICD-10-CM | POA: Diagnosis not present

## 2020-06-26 DIAGNOSIS — I351 Nonrheumatic aortic (valve) insufficiency: Secondary | ICD-10-CM | POA: Diagnosis not present

## 2020-06-26 DIAGNOSIS — M549 Dorsalgia, unspecified: Secondary | ICD-10-CM | POA: Diagnosis not present

## 2020-06-26 DIAGNOSIS — C028 Malignant neoplasm of overlapping sites of tongue: Secondary | ICD-10-CM | POA: Diagnosis not present

## 2020-06-26 DIAGNOSIS — E7800 Pure hypercholesterolemia, unspecified: Secondary | ICD-10-CM | POA: Diagnosis not present

## 2020-06-26 DIAGNOSIS — I110 Hypertensive heart disease with heart failure: Secondary | ICD-10-CM | POA: Diagnosis not present

## 2020-06-26 DIAGNOSIS — Z931 Gastrostomy status: Secondary | ICD-10-CM | POA: Diagnosis not present

## 2020-06-26 DIAGNOSIS — C300 Malignant neoplasm of nasal cavity: Secondary | ICD-10-CM | POA: Diagnosis not present

## 2020-06-26 DIAGNOSIS — C44321 Squamous cell carcinoma of skin of nose: Secondary | ICD-10-CM | POA: Diagnosis not present

## 2020-06-26 DIAGNOSIS — I10 Essential (primary) hypertension: Secondary | ICD-10-CM | POA: Diagnosis not present

## 2020-06-26 DIAGNOSIS — C021 Malignant neoplasm of border of tongue: Secondary | ICD-10-CM | POA: Diagnosis not present

## 2020-06-26 DIAGNOSIS — R519 Headache, unspecified: Secondary | ICD-10-CM | POA: Diagnosis not present

## 2020-06-26 DIAGNOSIS — J449 Chronic obstructive pulmonary disease, unspecified: Secondary | ICD-10-CM | POA: Diagnosis not present

## 2020-06-26 DIAGNOSIS — C320 Malignant neoplasm of glottis: Secondary | ICD-10-CM | POA: Diagnosis not present

## 2020-06-26 DIAGNOSIS — R40242 Glasgow coma scale score 9-12, unspecified time: Secondary | ICD-10-CM | POA: Diagnosis not present

## 2020-06-26 DIAGNOSIS — I340 Nonrheumatic mitral (valve) insufficiency: Secondary | ICD-10-CM | POA: Diagnosis not present

## 2020-06-26 DIAGNOSIS — I4891 Unspecified atrial fibrillation: Secondary | ICD-10-CM | POA: Diagnosis not present

## 2020-06-26 DIAGNOSIS — E039 Hypothyroidism, unspecified: Secondary | ICD-10-CM | POA: Diagnosis not present

## 2020-06-26 DIAGNOSIS — R001 Bradycardia, unspecified: Secondary | ICD-10-CM | POA: Diagnosis not present

## 2020-06-26 DIAGNOSIS — L7632 Postprocedural hematoma of skin and subcutaneous tissue following other procedure: Secondary | ICD-10-CM | POA: Diagnosis not present

## 2020-06-26 DIAGNOSIS — F17210 Nicotine dependence, cigarettes, uncomplicated: Secondary | ICD-10-CM | POA: Diagnosis not present

## 2020-06-26 DIAGNOSIS — Z930 Tracheostomy status: Secondary | ICD-10-CM | POA: Diagnosis not present

## 2020-06-26 DIAGNOSIS — E785 Hyperlipidemia, unspecified: Secondary | ICD-10-CM | POA: Diagnosis not present

## 2020-06-26 DIAGNOSIS — C01 Malignant neoplasm of base of tongue: Secondary | ICD-10-CM | POA: Diagnosis not present

## 2020-06-26 DIAGNOSIS — Z8546 Personal history of malignant neoplasm of prostate: Secondary | ICD-10-CM | POA: Diagnosis not present

## 2020-07-05 DIAGNOSIS — Z930 Tracheostomy status: Secondary | ICD-10-CM | POA: Diagnosis not present

## 2020-07-05 DIAGNOSIS — Z931 Gastrostomy status: Secondary | ICD-10-CM | POA: Diagnosis not present

## 2020-07-05 DIAGNOSIS — R1310 Dysphagia, unspecified: Secondary | ICD-10-CM | POA: Diagnosis not present

## 2020-07-10 DIAGNOSIS — I4891 Unspecified atrial fibrillation: Secondary | ICD-10-CM | POA: Diagnosis not present

## 2020-07-10 DIAGNOSIS — C3491 Malignant neoplasm of unspecified part of right bronchus or lung: Secondary | ICD-10-CM | POA: Diagnosis not present

## 2020-07-10 DIAGNOSIS — J449 Chronic obstructive pulmonary disease, unspecified: Secondary | ICD-10-CM | POA: Diagnosis not present

## 2020-07-10 DIAGNOSIS — I4892 Unspecified atrial flutter: Secondary | ICD-10-CM | POA: Diagnosis not present

## 2020-07-10 DIAGNOSIS — Z1389 Encounter for screening for other disorder: Secondary | ICD-10-CM | POA: Diagnosis not present

## 2020-07-10 DIAGNOSIS — Z1331 Encounter for screening for depression: Secondary | ICD-10-CM | POA: Diagnosis not present

## 2020-07-10 DIAGNOSIS — F17210 Nicotine dependence, cigarettes, uncomplicated: Secondary | ICD-10-CM | POA: Diagnosis not present

## 2020-07-10 DIAGNOSIS — E7849 Other hyperlipidemia: Secondary | ICD-10-CM | POA: Diagnosis not present

## 2020-07-12 DIAGNOSIS — R1312 Dysphagia, oropharyngeal phase: Secondary | ICD-10-CM | POA: Diagnosis not present

## 2020-07-18 DIAGNOSIS — C029 Malignant neoplasm of tongue, unspecified: Secondary | ICD-10-CM | POA: Diagnosis not present

## 2020-07-19 DIAGNOSIS — Z483 Aftercare following surgery for neoplasm: Secondary | ICD-10-CM | POA: Diagnosis not present

## 2020-07-19 DIAGNOSIS — Z886 Allergy status to analgesic agent status: Secondary | ICD-10-CM | POA: Diagnosis not present

## 2020-07-19 DIAGNOSIS — C069 Malignant neoplasm of mouth, unspecified: Secondary | ICD-10-CM | POA: Diagnosis not present

## 2020-07-19 DIAGNOSIS — Z9889 Other specified postprocedural states: Secondary | ICD-10-CM | POA: Diagnosis not present

## 2020-07-19 DIAGNOSIS — F17210 Nicotine dependence, cigarettes, uncomplicated: Secondary | ICD-10-CM | POA: Diagnosis not present

## 2020-07-24 DIAGNOSIS — K9423 Gastrostomy malfunction: Secondary | ICD-10-CM | POA: Diagnosis not present

## 2020-07-24 DIAGNOSIS — Z4659 Encounter for fitting and adjustment of other gastrointestinal appliance and device: Secondary | ICD-10-CM | POA: Diagnosis not present

## 2020-07-24 DIAGNOSIS — K9420 Gastrostomy complication, unspecified: Secondary | ICD-10-CM | POA: Diagnosis not present

## 2020-07-25 ENCOUNTER — Other Ambulatory Visit: Payer: PPO

## 2020-07-31 DIAGNOSIS — F17210 Nicotine dependence, cigarettes, uncomplicated: Secondary | ICD-10-CM | POA: Diagnosis not present

## 2020-07-31 DIAGNOSIS — Z01818 Encounter for other preprocedural examination: Secondary | ICD-10-CM | POA: Diagnosis not present

## 2020-07-31 DIAGNOSIS — C029 Malignant neoplasm of tongue, unspecified: Secondary | ICD-10-CM | POA: Diagnosis not present

## 2020-07-31 DIAGNOSIS — Z7901 Long term (current) use of anticoagulants: Secondary | ICD-10-CM | POA: Diagnosis not present

## 2020-07-31 DIAGNOSIS — I4891 Unspecified atrial fibrillation: Secondary | ICD-10-CM | POA: Diagnosis not present

## 2020-07-31 DIAGNOSIS — I10 Essential (primary) hypertension: Secondary | ICD-10-CM | POA: Diagnosis not present

## 2020-08-02 ENCOUNTER — Ambulatory Visit: Payer: PPO | Admitting: Urology

## 2020-08-03 ENCOUNTER — Ambulatory Visit: Payer: PPO | Admitting: Urology

## 2020-08-04 DIAGNOSIS — Z431 Encounter for attention to gastrostomy: Secondary | ICD-10-CM | POA: Diagnosis not present

## 2020-08-04 DIAGNOSIS — F17210 Nicotine dependence, cigarettes, uncomplicated: Secondary | ICD-10-CM | POA: Diagnosis not present

## 2020-08-04 DIAGNOSIS — K9420 Gastrostomy complication, unspecified: Secondary | ICD-10-CM | POA: Diagnosis not present

## 2020-08-04 DIAGNOSIS — I10 Essential (primary) hypertension: Secondary | ICD-10-CM | POA: Diagnosis not present

## 2020-08-04 DIAGNOSIS — Z8546 Personal history of malignant neoplasm of prostate: Secondary | ICD-10-CM | POA: Diagnosis not present

## 2020-08-04 DIAGNOSIS — Z8521 Personal history of malignant neoplasm of larynx: Secondary | ICD-10-CM | POA: Diagnosis not present

## 2020-08-04 DIAGNOSIS — I2510 Atherosclerotic heart disease of native coronary artery without angina pectoris: Secondary | ICD-10-CM | POA: Diagnosis not present

## 2020-08-04 DIAGNOSIS — J9601 Acute respiratory failure with hypoxia: Secondary | ICD-10-CM | POA: Diagnosis not present

## 2020-08-04 DIAGNOSIS — Z931 Gastrostomy status: Secondary | ICD-10-CM | POA: Diagnosis not present

## 2020-08-04 DIAGNOSIS — J189 Pneumonia, unspecified organism: Secondary | ICD-10-CM | POA: Diagnosis not present

## 2020-08-04 DIAGNOSIS — E7800 Pure hypercholesterolemia, unspecified: Secondary | ICD-10-CM | POA: Diagnosis not present

## 2020-08-04 DIAGNOSIS — Z4659 Encounter for fitting and adjustment of other gastrointestinal appliance and device: Secondary | ICD-10-CM | POA: Diagnosis not present

## 2020-08-04 DIAGNOSIS — Z79899 Other long term (current) drug therapy: Secondary | ICD-10-CM | POA: Diagnosis not present

## 2020-08-04 DIAGNOSIS — Z85118 Personal history of other malignant neoplasm of bronchus and lung: Secondary | ICD-10-CM | POA: Diagnosis not present

## 2020-08-04 DIAGNOSIS — I959 Hypotension, unspecified: Secondary | ICD-10-CM | POA: Diagnosis not present

## 2020-08-04 DIAGNOSIS — D720 Genetic anomalies of leukocytes: Secondary | ICD-10-CM | POA: Diagnosis not present

## 2020-08-04 DIAGNOSIS — R404 Transient alteration of awareness: Secondary | ICD-10-CM | POA: Diagnosis not present

## 2020-08-04 DIAGNOSIS — J440 Chronic obstructive pulmonary disease with acute lower respiratory infection: Secondary | ICD-10-CM | POA: Diagnosis not present

## 2020-08-04 DIAGNOSIS — R1310 Dysphagia, unspecified: Secondary | ICD-10-CM | POA: Diagnosis not present

## 2020-08-04 DIAGNOSIS — R0902 Hypoxemia: Secondary | ICD-10-CM | POA: Diagnosis not present

## 2020-08-04 DIAGNOSIS — E039 Hypothyroidism, unspecified: Secondary | ICD-10-CM | POA: Diagnosis not present

## 2020-08-04 DIAGNOSIS — F32A Depression, unspecified: Secondary | ICD-10-CM | POA: Diagnosis not present

## 2020-08-04 DIAGNOSIS — I480 Paroxysmal atrial fibrillation: Secondary | ICD-10-CM | POA: Diagnosis not present

## 2020-08-04 DIAGNOSIS — E872 Acidosis: Secondary | ICD-10-CM | POA: Diagnosis not present

## 2020-08-04 DIAGNOSIS — Z7982 Long term (current) use of aspirin: Secondary | ICD-10-CM | POA: Diagnosis not present

## 2020-08-04 DIAGNOSIS — R4189 Other symptoms and signs involving cognitive functions and awareness: Secondary | ICD-10-CM | POA: Diagnosis not present

## 2020-08-04 DIAGNOSIS — Z969 Presence of functional implant, unspecified: Secondary | ICD-10-CM | POA: Diagnosis not present

## 2020-08-04 DIAGNOSIS — Z930 Tracheostomy status: Secondary | ICD-10-CM | POA: Diagnosis not present

## 2020-08-04 DIAGNOSIS — R1314 Dysphagia, pharyngoesophageal phase: Secondary | ICD-10-CM | POA: Diagnosis not present

## 2020-08-04 DIAGNOSIS — J9602 Acute respiratory failure with hypercapnia: Secondary | ICD-10-CM | POA: Diagnosis not present

## 2020-08-04 DIAGNOSIS — K219 Gastro-esophageal reflux disease without esophagitis: Secondary | ICD-10-CM | POA: Diagnosis not present

## 2020-08-04 DIAGNOSIS — Z20822 Contact with and (suspected) exposure to covid-19: Secondary | ICD-10-CM | POA: Diagnosis not present

## 2020-08-04 DIAGNOSIS — I4891 Unspecified atrial fibrillation: Secondary | ICD-10-CM | POA: Diagnosis not present

## 2020-08-04 DIAGNOSIS — Z7901 Long term (current) use of anticoagulants: Secondary | ICD-10-CM | POA: Diagnosis not present

## 2020-08-15 DIAGNOSIS — Z9089 Acquired absence of other organs: Secondary | ICD-10-CM | POA: Diagnosis not present

## 2020-08-15 DIAGNOSIS — C01 Malignant neoplasm of base of tongue: Secondary | ICD-10-CM | POA: Diagnosis not present

## 2020-08-15 DIAGNOSIS — R911 Solitary pulmonary nodule: Secondary | ICD-10-CM | POA: Diagnosis not present

## 2020-08-15 DIAGNOSIS — Z9889 Other specified postprocedural states: Secondary | ICD-10-CM | POA: Diagnosis not present

## 2020-08-15 DIAGNOSIS — Z9049 Acquired absence of other specified parts of digestive tract: Secondary | ICD-10-CM | POA: Diagnosis not present

## 2020-08-15 DIAGNOSIS — Z483 Aftercare following surgery for neoplasm: Secondary | ICD-10-CM | POA: Diagnosis not present

## 2020-08-15 DIAGNOSIS — C321 Malignant neoplasm of supraglottis: Secondary | ICD-10-CM | POA: Diagnosis not present

## 2020-08-21 ENCOUNTER — Other Ambulatory Visit: Payer: Self-pay

## 2020-08-21 MED ORDER — PAIN MANAGEMENT IT PUMP REFILL: 1.0000 | Freq: Once | INTRATHECAL | 0 refills | Status: AC

## 2020-08-25 DIAGNOSIS — Z931 Gastrostomy status: Secondary | ICD-10-CM | POA: Diagnosis not present

## 2020-08-25 DIAGNOSIS — C029 Malignant neoplasm of tongue, unspecified: Secondary | ICD-10-CM | POA: Diagnosis not present

## 2020-08-25 DIAGNOSIS — I10 Essential (primary) hypertension: Secondary | ICD-10-CM | POA: Diagnosis not present

## 2020-08-25 DIAGNOSIS — Z923 Personal history of irradiation: Secondary | ICD-10-CM | POA: Diagnosis not present

## 2020-08-25 DIAGNOSIS — Z716 Tobacco abuse counseling: Secondary | ICD-10-CM | POA: Diagnosis not present

## 2020-08-25 DIAGNOSIS — Z79899 Other long term (current) drug therapy: Secondary | ICD-10-CM | POA: Diagnosis not present

## 2020-08-25 DIAGNOSIS — Z7901 Long term (current) use of anticoagulants: Secondary | ICD-10-CM | POA: Diagnosis not present

## 2020-08-25 DIAGNOSIS — K219 Gastro-esophageal reflux disease without esophagitis: Secondary | ICD-10-CM | POA: Diagnosis not present

## 2020-08-25 DIAGNOSIS — R911 Solitary pulmonary nodule: Secondary | ICD-10-CM | POA: Diagnosis not present

## 2020-08-25 DIAGNOSIS — Z930 Tracheostomy status: Secondary | ICD-10-CM | POA: Diagnosis not present

## 2020-08-25 DIAGNOSIS — I4891 Unspecified atrial fibrillation: Secondary | ICD-10-CM | POA: Diagnosis not present

## 2020-08-25 DIAGNOSIS — F17210 Nicotine dependence, cigarettes, uncomplicated: Secondary | ICD-10-CM | POA: Diagnosis not present

## 2020-09-10 DIAGNOSIS — I4891 Unspecified atrial fibrillation: Secondary | ICD-10-CM | POA: Diagnosis not present

## 2020-09-14 DIAGNOSIS — E7849 Other hyperlipidemia: Secondary | ICD-10-CM | POA: Diagnosis not present

## 2020-09-14 DIAGNOSIS — F331 Major depressive disorder, recurrent, moderate: Secondary | ICD-10-CM | POA: Diagnosis not present

## 2020-09-14 DIAGNOSIS — I4891 Unspecified atrial fibrillation: Secondary | ICD-10-CM | POA: Diagnosis not present

## 2020-09-14 DIAGNOSIS — I4892 Unspecified atrial flutter: Secondary | ICD-10-CM | POA: Diagnosis not present

## 2020-09-14 DIAGNOSIS — E440 Moderate protein-calorie malnutrition: Secondary | ICD-10-CM | POA: Diagnosis not present

## 2020-09-14 DIAGNOSIS — F17210 Nicotine dependence, cigarettes, uncomplicated: Secondary | ICD-10-CM | POA: Diagnosis not present

## 2020-09-14 DIAGNOSIS — C3491 Malignant neoplasm of unspecified part of right bronchus or lung: Secondary | ICD-10-CM | POA: Diagnosis not present

## 2020-09-14 DIAGNOSIS — J449 Chronic obstructive pulmonary disease, unspecified: Secondary | ICD-10-CM | POA: Diagnosis not present

## 2020-09-17 NOTE — Progress Notes (Signed)
PROVIDER NOTE: Information contained herein reflects review and annotations entered in association with encounter. Interpretation of such information and data should be left to medically-trained personnel. Information provided to patient can be located elsewhere in the medical record under "Patient Instructions". Document created using STT-dictation technology, any transcriptional errors that may result from process are unintentional.    Patient: Darrell White  Service Category: Procedure  Provider: Gaspar Cola, MD  DOB: 1951-01-06  DOS: 09/19/2020  Location: [facility]  MRN: [id number]  Setting: Ambulatory - outpatient  Referring Provider: [name], MD  Type: Established Patient  Specialty: Interventional Pain Management  PCP: Darrell Hilding, MD   Primary Reason for Visit: Interventional Pain Management Treatment. CC: Back Pain  Procedure:          Intrathecal Drug Delivery System (IDDS):  Type: Reservoir Refill [phone]) No rate change Region: Abdominal Laterality: Right  Type of Pump: Medtronic Synchromed II Delivery Route: Intrathecal Type of Pain Treated: Neuropathic/Nociceptive Primary Medication Class: Opioid/opiate  Medication, Concentration, Infusion Program, & Delivery Rate: Please see scanned programming printout.   Indications: 1. Chronic pain syndrome   2. Cancer-related pain   3. Prostate cancer (Darrell White)   4. Chronic low back pain (1ry area of Pain) (Bilateral) (R>L)   5. Chronic lower extremity pain (2ry area of Pain) (Bilateral) (R>L)   6. Chronic neck pain (3ry area of Pain) (Left)   7. DDD (degenerative disc disease), lumbar   8. Failed back surgical syndrome   9. Presence of implanted infusion pump   10. Presence of intrathecal pump   11. Pharmacologic therapy   12. Chronic use of opiate for therapeutic purpose   13. Encounter for medication management   14. Encounter for therapeutic procedure    Pain Assessment: Self-Reported Pain  Score: 8 /10             Reported level is compatible with observation.        The patient returns to the clinic today already with his tracheostomy.  He can still communicate but clearly he is having some impairment secondary to the cancer.  He indicates doing very well with his pump and he is here for a refill.  No adjustment needed. 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 A feeding tube and trach was inserted.  Pharmacotherapy Assessment   Analgesic: Oxycodone IR 10 mg, 1 tabl PO q 6 hrs (40 mg/day of oxycodone) MME/day: 60 mg/day.   Monitoring:  PMP: PDMP reviewed during this encounter.       Pharmacotherapy: No side-effects or adverse reactions reported. Compliance: No problems identified. Effectiveness: Clinically acceptable. Plan: Refer to "POC". UDS:  Summary  Date Value Ref Range Status  03/26/2018 FINAL  Final    Comment:    ==================================================================== TOXASSURE SELECT 13 (MW) ==================================================================== Test                             Result       Flag       Units Drug Present and Declared for Prescription Verification   Oxycodone                      1772         EXPECTED   ng/mg creat   Oxymorphone                    >4115  EXPECTED   ng/mg creat   Noroxycodone                   >4115        EXPECTED   ng/mg creat   Noroxymorphone                 1803         EXPECTED   ng/mg creat    Sources of oxycodone are scheduled prescription medications.    Oxymorphone, noroxycodone, and noroxymorphone are expected    metabolites of oxycodone. Oxymorphone is also available as a    scheduled prescription medication. Drug Present not Declared for Prescription Verification   Morphine                       1085         UNEXPECTED ng/mg creat    Potential sources of large amounts of morphine in the absence of    codeine include administration of morphine or use of  heroin. ==================================================================== Test                      Result    Flag   Units      Ref Range   Creatinine              243              mg/dL      >=20 ==================================================================== Declared Medications:  The flagging and interpretation on this report are based on the  following declared medications.  Unexpected results may arise from  inaccuracies in the declared medications.  **Note: The testing scope of this panel includes these medications:  Oxycodone  **Note: The testing scope of this panel does not include following  reported medications:  Acetaminophen (Tylenol)  Atorvastatin (Lipitor)  Docusate  Gabapentin (Neurontin)  Levothyroxine  Mirtazapine  Omeprazole  Sotalol  Tiotropium  Trazodone ==================================================================== For clinical consultation, please call [phone]. ====================================================================      Intrathecal Pump Therapy Assessment  Manufacturer: Medtronic Synchromed Type: Programmable Volume: 40 mL reservoir MRI compatibility: Yes   Drug content:  Primary Medication Class: Opioid Primary Medication: PF-Morphine (20 mg/mL) Secondary Medication: PF-Bupivacaine (20 mg/mL) Other Medication: No third medication   Programming:  Type: Simple continuous. See pump readout for details.   Changes:  Medication Change: None at this point Rate Change: No change in rate  Reported side-effects or adverse reactions: None reported  Effectiveness: Described as relatively effective, allowing for increase in activities of daily living (ADL) Clinically meaningful improvement in function (CMIF): Sustained CMIF goals met  Plan: Pump refill today  Pre-op H&P Assessment:  Mr. Darrell White is a 70 y.o. (year old), male patient, seen today for interventional treatment. He  has a past surgical history that includes  Mouth surgery; Cervical disc surgery; Shoulder arthroscopy; Back surgery; Spinal fusion; Spine surgery; Knee arthroscopy; Cataract extraction w/PHACO (Left, 04/09/2012); Direct laryngoscopy (N/A, 03/26/2016); Excision of tongue lesion (Right, 03/26/2016); OTHER SURGICAL HISTORY; CT LUNG SCREENING (06/16/2017); Direct laryngoscopy (N/A, 05/31/2019); and Intrathecal pump implant (Right, 08/02/2019). Mr. College has a current medication list which includes the following prescription(s): albuterol, amiodarone, eliquis, atorvastatin, diclofenac sodium, gabapentin, levofloxacin, levothyroxine, metoprolol succinate, mirtazapine, naloxone, narcan, pantoprazole, sotalol, tiotropium, trazodone, clonidine, methocarbamol, [START ON 09/20/2020] oxycodone hcl, [START ON 10/20/2020] oxycodone hcl, and [START ON 11/19/2020] oxycodone hcl. His primarily concern today is the Back Pain  Initial Vital Signs:  Pulse/HCG Rate: 71  Temp: (!) 97.1 F (36.2 C) Resp: 16 BP: (!) 97/56 SpO2: 100 %  BMI: Estimated body mass index is 20.53 kg/m as calculated from the following:   Height as of this encounter: $RemoveBeforeD'5\' 8"'cXqiuwZmPGTSSb$  (1.727 m).   Weight as of this encounter: 135 lb (61.2 kg).  Risk Assessment: Allergies: Reviewed. He is allergic to nsaids.  Allergy Precautions: None required Coagulopathies: Reviewed. None identified.  Blood-thinner therapy: None at this time Active Infection(s): Reviewed. None identified. Mr. Mauss is afebrile  Site Confirmation: Mr. Malmstrom was asked to confirm the procedure and laterality before marking the site Procedure checklist: Completed Consent: Before the procedure and under the influence of no sedative(s), amnesic(s), or anxiolytics, the patient was informed of the treatment options, risks and possible complications. To fulfill our ethical and legal obligations, as recommended by the American Medical Association's Code of Ethics, I have informed the patient of my clinical impression; the nature and purpose of  the treatment or procedure; the risks, benefits, and possible complications of the intervention; the alternatives, including doing nothing; the risk(s) and benefit(s) of the alternative treatment(s) or procedure(s); and the risk(s) and benefit(s) of doing nothing.  Mr. Mundie was provided with information about the general risks and possible complications associated with most interventional procedures. These include, but are not limited to: failure to achieve desired goals, infection, bleeding, organ or nerve damage, allergic reactions, paralysis, and/or death.  In addition, he was informed of those risks and possible complications associated to this particular procedure, which include, but are not limited to: damage to the implant; failure to decrease pain; local, systemic, or serious CNS infections, intraspinal abscess with possible cord compression and paralysis, or life-threatening such as meningitis; bleeding; organ damage; nerve injury or damage with subsequent sensory, motor, and/or autonomic system dysfunction, resulting in transient or permanent pain, numbness, and/or weakness of one or several areas of the body; allergic reactions, either minor or major life-threatening, such as anaphylactic or anaphylactoid reactions.  Furthermore, Mr. Mccravy was informed of those risks and complications associated with the medications. These include, but are not limited to: allergic reactions (i.e.: anaphylactic or anaphylactoid reactions); endorphine suppression; bradycardia and/or hypotension; water retention and/or peripheral vascular relaxation leading to lower extremity edema and possible stasis ulcers; respiratory depression and/or shortness of breath; decreased metabolic rate leading to weight gain; swelling or edema; medication-induced neural toxicity; particulate matter embolism and blood vessel occlusion with resultant organ, and/or nervous system infarction; and/or intrathecal granuloma formation with  possible spinal cord compression and permanent paralysis.  Before refilling the pump Mr. Villella was informed that some of the medications used in the devise may not be FDA approved for such use and therefore it constitutes an off-label use of the medications.  Finally, he was informed that Medicine is not an exact science; therefore, there is also the possibility of unforeseen or unpredictable risks and/or possible complications that may result in a catastrophic outcome. The patient indicated having understood very clearly. We have given the patient no guarantees and we have made no promises. Enough time was given to the patient to ask questions, all of which were answered to the patient's satisfaction. Mr. Goostree has indicated that he wanted to continue with the procedure. Attestation: I, the ordering provider, attest that I have discussed with the patient the benefits, risks, side-effects, alternatives, likelihood of achieving goals, and potential problems during recovery for the procedure that I have provided informed consent. Date  Time: 09/19/2020  1:28 PM  Pre-Procedure Preparation:  Monitoring:  As per clinic protocol. Respiration, ETCO2, SpO2, BP, heart rate and rhythm monitor placed and checked for adequate function Safety Precautions: Patient was assessed for positional comfort and pressure points before starting the procedure. Time-out: I initiated and conducted the "Time-out" before starting the procedure, as per protocol. The patient was asked to participate by confirming the accuracy of the "Time Out" information. Verification of the correct person, site, and procedure were performed and confirmed by me, the nursing staff, and the patient. "Time-out" conducted as per Joint Commission's Universal Protocol (UP.01.01.01). Time: 1404  Description of Procedure:          Position: Supine Target Area: Central-port of intrathecal pump. Approach: Anterior, 90 degree angle approach. Area Prepped:  Entire Area around the pump implant. DuraPrep (Iodine Povacrylex [0.7% available iodine] and Isopropyl Alcohol, 74% w/w) Safety Precautions: Aspiration looking for blood return was conducted prior to all injections. At no point did we inject any substances, as a needle was being advanced. No attempts were made at seeking any paresthesias. Safe injection practices and needle disposal techniques used. Medications properly checked for expiration dates. SDV (single dose vial) medications used. Description of the Procedure: Protocol guidelines were followed. Two nurses trained to do implant refills were present during the entire procedure. The refill medication was checked by both healthcare providers as well as the patient. The patient was included in the "Time-out" to verify the medication. The patient was placed in position. The pump was identified. The area was prepped in the usual manner. The sterile template was positioned over the pump, making sure the side-port location matched that of the pump. Both, the pump and the template were held for stability. The needle provided in the Medtronic Kit was then introduced thru the center of the template and into the central port. The pump content was aspirated and discarded volume documented. The new medication was slowly infused into the pump, thru the filter, making sure to avoid overpressure of the device. The needle was then removed and the area cleansed, making sure to leave some of the prepping solution back to take advantage of its long term bactericidal properties. The pump was interrogated and programmed to reflect the correct medication, volume, and dosage. The program was printed and taken to the physician for approval. Once checked and signed by the physician, a copy was provided to the patient and another scanned into the EMR. Vitals:   09/19/20 1327  BP: (!) 97/56  Pulse: 71  Resp: 16  Temp: (!) 97.1 F (36.2 C)  TempSrc: Temporal  SpO2: 100%   Weight: 135 lb (61.2 kg)  Height: $Remove'5\' 8"'mHTHuUQ$  (1.727 m)    Start Time: 1408 hrs. End Time: 1415 hrs. Materials & Medications: Medtronic Refill Kit Medication(s): Please see chart orders for details.  Imaging Guidance:          Type of Imaging Technique: None used Indication(s): N/A Exposure Time: No patient exposure Contrast: None used. Fluoroscopic Guidance: N/A Ultrasound Guidance: N/A Interpretation: N/A  Antibiotic Prophylaxis:   Anti-infectives (From admission, onward)    None      Indication(s): None identified  Post-operative Assessment:  Post-procedure Vital Signs:  Pulse/HCG Rate: 71  Temp: (!) 97.1 F (36.2 C) Resp: 16 BP: (!) 97/56 SpO2: 100 %  EBL: None  Complications: No immediate post-treatment complications observed by team, or reported by patient.  Note: The patient tolerated the entire procedure well. A repeat set of vitals were taken after the procedure and the patient was kept under  observation following institutional policy, for this type of procedure. Post-procedural neurological assessment was performed, showing return to baseline, prior to discharge. The patient was provided with post-procedure discharge instructions, including a section on how to identify potential problems. Should any problems arise concerning this procedure, the patient was given instructions to immediately contact us, at any time, without hesitation. In any case, we plan to contact the patient by telephone for a follow-up status report regarding this interventional procedure.  Comments:  No additional relevant information.  Plan of Care  Orders:  Orders Placed This Encounter  Procedures   PUMP REFILL    Maintain Protocol by having two(2) healthcare providers during procedure and programming.    Scheduling Instructions:     Please refill intrathecal pump today.    Order Specific Question:   Where will this procedure be performed?    Answer:   [facility]   PUMP  REFILL    Whenever possible schedule on a procedure today.    Standing Status:   Future    Standing Expiration Date:   02/16/2021    Scheduling Instructions:     Please schedule intrathecal pump refill based on pump programming. Avoid schedule intervals of more than 120 days (4 months).    Order Specific Question:   Where will this procedure be performed?    Answer:   [facility]   Informed Consent Details: Physician/Practitioner Attestation; Transcribe to consent form and obtain patient signature    Transcribe to consent form and obtain patient signature.    Order Specific Question:   Physician/Practitioner attestation of informed consent for procedure/surgical case    Answer:   I, the physician/practitioner, attest that I have discussed with the patient the benefits, risks, side effects, alternatives, likelihood of achieving goals and potential problems during recovery for the procedure that I have provided informed consent.    Order Specific Question:   Procedure    Answer:   Intrathecal pump refill    Order Specific Question:   Physician/Practitioner performing the procedure    Answer:   Attending Physician: Kathlen Brunswick. Dossie Arbour, MD & designated trained staff    Order Specific Question:   Indication/Reason    Answer:   Chronic Pain Syndrome (G89.4), presence of an intrathecal pump (Z97.8)    Chronic Opioid Analgesic:  Oxycodone IR 10 mg, 1 tabl PO q 6 hrs (40 mg/day of oxycodone) MME/day: 60 mg/day.   Medications ordered for procedure: Meds ordered this encounter  Medications   Oxycodone HCl 10 MG TABS    Sig: Take 1 tablet (10 mg total) by mouth every 6 (six) hours as needed. Must last 30 days    Dispense:  120 tablet    Refill:  0    Not a duplicate. Do NOT delete! Dispense 1 day early if closed on refill date. Avoid benzodiazepines within 8 hours of opioids. Do not send refill requests.   Oxycodone HCl 10 MG TABS    Sig: Take 1 tablet (10 mg total) by mouth every 6  (six) hours as needed. Must last 30 days    Dispense:  120 tablet    Refill:  0    Not a duplicate. Do NOT delete! Dispense 1 day early if closed on refill date. Avoid benzodiazepines within 8 hours of opioids. Do not send refill requests.   Oxycodone HCl 10 MG TABS    Sig: Take 1 tablet (10 mg total) by mouth every 6 (six) hours as needed. Must last 30  days    Dispense:  120 tablet    Refill:  0    Not a duplicate. Do NOT delete! Dispense 1 day early if closed on refill date. Avoid benzodiazepines within 8 hours of opioids. Do not send refill requests.    Medications administered: Darrell White had no medications administered during this visit.  See the medical record for exact dosing, route, and time of administration.  Follow-up plan:   Return for Pump Refill (Max:52mo).       Interventional treatment options: Planned, scheduled, and/or pending:   Palliative intrathecal pump management    Under consideration:   Diagnostic bilateral IA hip joint injection  Diagnostic bilateral lumbar facet block #1  Possible bilateral lumbar facet RFA  Diagnostic caudal ESI + diagnostic epidurogram  Possible Racz procedure  Diagnostic left cervical ESI  Diagnostic left cervical facet block  Possible left cervical facet RFA    Therapeutic/palliative (PRN):   Diagnostic left IA shoulder (glenohumeral and AC joint) injection #2 (100/100/100/100) Therapeutic/palliative intrathecal pump refill(s)        Recent Visits Date Type Provider Dept  06/22/20 Procedure visit Milinda Pointer, MD [facility]  Showing recent visits within past 90 days and meeting all other requirements Today's Visits Date Type Provider Dept  09/19/20 Procedure visit Milinda Pointer, MD [facility]  Showing today's visits and meeting all other requirements Future Appointments Date Type Provider Dept  12/05/20 Appointment Milinda Pointer, MD [facility]  Showing future  appointments within next 90 days and meeting all other requirements Disposition: Discharge home  Discharge (Date  Time): 09/19/2020; 1425 hrs.   Primary Care Physician: Darrell Hilding, MD Location: [facility] Note by: Darrell Cola, MD Date: 09/19/2020; Time: 3:14 PM  Disclaimer:  Medicine is not an Chief Strategy Officer. The only guarantee in medicine is that nothing is guaranteed. It is important to note that the decision to proceed with this intervention was based on the information collected from the patient. The Data and conclusions were drawn from the patient's questionnaire, the interview, and the physical examination. Because the information was provided in large part by the patient, it cannot be guaranteed that it has not been purposely or unconsciously manipulated. Every effort has been made to obtain as much relevant data as possible for this evaluation. It is important to note that the conclusions that lead to this procedure are derived in large part from the available data. Always take into account that the treatment will also be dependent on availability of resources and existing treatment guidelines, considered by other Pain Management Practitioners as being common knowledge and practice, at the time of the intervention. For Medico-Legal purposes, it is also important to point out that variation in procedural techniques and pharmacological choices are the acceptable norm. The indications, contraindications, technique, and results of the above procedure should only be interpreted and judged by a Board-Certified Interventional Pain Specialist with extensive familiarity and expertise in the same exact procedure and technique.

## 2020-09-19 ENCOUNTER — Other Ambulatory Visit: Payer: Self-pay

## 2020-09-19 ENCOUNTER — Ambulatory Visit: Payer: PPO | Attending: Pain Medicine | Admitting: Pain Medicine

## 2020-09-19 ENCOUNTER — Encounter: Payer: Self-pay | Admitting: Pain Medicine

## 2020-09-19 VITALS — BP 97/56 | HR 71 | Temp 97.1°F | Resp 16 | Ht 68.0 in | Wt 135.0 lb

## 2020-09-19 DIAGNOSIS — Z9689 Presence of other specified functional implants: Secondary | ICD-10-CM | POA: Diagnosis not present

## 2020-09-19 DIAGNOSIS — C61 Malignant neoplasm of prostate: Secondary | ICD-10-CM | POA: Insufficient documentation

## 2020-09-19 DIAGNOSIS — G893 Neoplasm related pain (acute) (chronic): Secondary | ICD-10-CM | POA: Insufficient documentation

## 2020-09-19 DIAGNOSIS — F1120 Opioid dependence, uncomplicated: Secondary | ICD-10-CM | POA: Diagnosis not present

## 2020-09-19 DIAGNOSIS — M5136 Other intervertebral disc degeneration, lumbar region: Secondary | ICD-10-CM

## 2020-09-19 DIAGNOSIS — M79605 Pain in left leg: Secondary | ICD-10-CM | POA: Insufficient documentation

## 2020-09-19 DIAGNOSIS — G8929 Other chronic pain: Secondary | ICD-10-CM

## 2020-09-19 DIAGNOSIS — M79604 Pain in right leg: Secondary | ICD-10-CM | POA: Diagnosis not present

## 2020-09-19 DIAGNOSIS — M5442 Lumbago with sciatica, left side: Secondary | ICD-10-CM

## 2020-09-19 DIAGNOSIS — Z981 Arthrodesis status: Secondary | ICD-10-CM | POA: Diagnosis not present

## 2020-09-19 DIAGNOSIS — Z95828 Presence of other vascular implants and grafts: Secondary | ICD-10-CM

## 2020-09-19 DIAGNOSIS — Z978 Presence of other specified devices: Secondary | ICD-10-CM

## 2020-09-19 DIAGNOSIS — M542 Cervicalgia: Secondary | ICD-10-CM | POA: Diagnosis not present

## 2020-09-19 DIAGNOSIS — Z5189 Encounter for other specified aftercare: Secondary | ICD-10-CM

## 2020-09-19 DIAGNOSIS — G894 Chronic pain syndrome: Secondary | ICD-10-CM | POA: Diagnosis not present

## 2020-09-19 DIAGNOSIS — Z886 Allergy status to analgesic agent status: Secondary | ICD-10-CM | POA: Diagnosis not present

## 2020-09-19 DIAGNOSIS — Z79891 Long term (current) use of opiate analgesic: Secondary | ICD-10-CM

## 2020-09-19 DIAGNOSIS — Z79899 Other long term (current) drug therapy: Secondary | ICD-10-CM

## 2020-09-19 DIAGNOSIS — M961 Postlaminectomy syndrome, not elsewhere classified: Secondary | ICD-10-CM

## 2020-09-19 MED ORDER — OXYCODONE HCL 10 MG PO TABS
10.0000 mg | ORAL_TABLET | Freq: Four times a day (QID) | ORAL | 0 refills | Status: DC | PRN
Start: 2020-10-20 — End: 2020-12-04

## 2020-09-19 MED ORDER — OXYCODONE HCL 10 MG PO TABS: 10.0000 mg | ORAL_TABLET | Freq: Four times a day (QID) | ORAL | 0 refills | Status: DC | PRN

## 2020-09-19 MED ORDER — OXYCODONE HCL 10 MG PO TABS: 10.0000 mg | ORAL_TABLET | Freq: Four times a day (QID) | ORAL | 0 refills | Status: AC | PRN

## 2020-09-19 NOTE — Progress Notes (Signed)
Nursing Pain Medication Assessment:  Safety precautions to be maintained throughout the outpatient stay will include: orient to surroundings, keep bed in low position, maintain call bell within reach at all times, provide assistance with transfer out of bed and ambulation.  Medication Inspection Compliance: Pill count conducted under aseptic conditions, in front of the patient. Neither the pills nor the bottle was removed from the patient's sight at any time. Once count was completed pills were immediately returned to the patient in their original bottle.  Medication: Oxycodone IR Pill/Patch Count:  65 of 120 pills remain Pill/Patch Appearance: Markings consistent with prescribed medication Bottle Appearance: Standard pharmacy container. Clearly labeled. 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 A feeding tube and trach was inserted.

## 2020-09-19 NOTE — Patient Instructions (Signed)
Opioid Overdose Opioids are drugs that are often used to treat pain. Opioids include illegal drugs, such as heroin, as well as prescription pain medicines, such as codeine, morphine, hydrocodone, oxycodone, and fentanyl. An opioid overdose happens when you take too much of an opioid. An overdose may be intentional or accidentaland can happen with any type of opioid. The effects of an overdose can be mild, dangerous, or even deadly. Opioidoverdose is a medical emergency. What are the causes? This condition may be caused by: Taking too much of an opioid on purpose. Taking too much of an opioid by accident. Using two or more substances that contain opioids at the same time. Taking an opioid with a substance that affects your heart, breathing, or blood pressure. These include alcohol, tranquilizers, sleeping pills, illegal drugs, and some over-the-counter medicines. This condition may also happen due to an error made by: A health care provider who prescribes a medicine. The pharmacist who fills the prescription order. What increases the risk? This condition is more likely in: Children. They may be attracted to colorful pills. Because of a child's small size, even a small amount of a drug can be dangerous. Older people. They may be taking many different drugs. Older people may have difficulty reading labels or remembering when they last took their medicine. They may also be more sensitive to the effects of opioids. People with chronic medical conditions, especially heart, liver, kidney, or neurological diseases. People who take an opioid for a long period of time. People who use: Illegal drugs. IV heroin is especially dangerous. Other substances, including alcohol, while using an opioid. People who have: A history of drug or alcohol abuse. Certain mental health conditions. A history of previous drug overdoses. People who take opioids that are not prescribed for them. What are the signs or  symptoms? Symptoms of this condition depend on the type of opioid and the amount that was taken. Common symptoms include: Sleepiness or difficulty waking from sleep. Decrease in attention. Confusion. Slurred speech. Slowed breathing and a slow pulse (bradycardia). Nausea and vomiting. Abnormally small pupils. Signs and symptoms that require emergency treatment include: Cold, clammy, and pale skin. Blue lips and fingernails. Vomiting. Gurgling sounds in the throat. A pulse that is very slow or difficult to detect. Breathing that is very irregular, slow, noisy, or difficult to detect. Limp body. Inability to respond to speech or be awakened from sleep (stupor). Seizures. How is this diagnosed? This condition is diagnosed based on your symptoms and medical history. It is important to tell your health care provider: About all of the opioids that you took. When you took the opioids. Whether you were drinking alcohol or using marijuana, cocaine, or other drugs. Your health care provider will do a physical exam. This exam may include: Checking and monitoring your heart rate and rhythm, breathing rate, temperature, and blood pressure (vital signs). Measuring oxygen levels in your blood. Checking for abnormally small pupils. You may also have blood tests or urine tests. You may have X-rays if you arehaving severe breathing problems. How is this treated? This condition requires immediate medical treatment and hospitalization. Treatment is given in the hospital intensive care (ICU) setting. Supporting your vital signs and your breathing is the first step in treating an opioid overdose. Treatment may also include: Giving salts and minerals (electrolytes) along with fluids through an IV. Inserting a breathing tube (endotracheal tube) in your airway to help you breathe if you cannot breathe on your own or you are in  danger of not being able to breathe on your own. Giving oxygen through a small  tube under your nose. Passing a tube through your nose and into your stomach (nasogastric tube, or NG tube) to empty your stomach. Giving medicines that: Increase your blood pressure. Relieve nausea and vomiting. Relieve abdominal pain and cramping. Reverse the effects of the opioid (naloxone). Monitoring your heart and oxygen levels. Ongoing counseling and mental health support if you intentionally overdosed or used an illegal drug. Follow these instructions at home:  Medicines Take over-the-counter and prescription medicines only as told by your health care provider. Always ask your health care provider about possible side effects and interactions of any new medicine that you start taking. Keep a list of all the medicines that you take, including over-the-counter medicines. Bring this list with you to all your medical visits. General instructions Drink enough fluid to keep your urine pale yellow. Keep all follow-up visits as told by your health care provider. This is important. How is this prevented? Read the drug inserts that come with your opioid pain medicines. Take medicines only as told by your health care provider. Do not take more medicine than you are told. Do not take medicines more frequently than you are told. Do not drink alcohol or take sedatives when taking opioids. Do not use illegal or recreational drugs, including cocaine, ecstasy, and marijuana. Do not take opioid medicines that are not prescribed for you. Store all medicines in safety containers that are out of the reach of children. Get help if you are struggling with: Alcohol or drug use. Depression or another mental health problem. Thoughts of hurting yourself or another person. Keep the phone number of your local poison control center near your phone or in your mobile phone. In the [place] the hotline of the [facility] is (828)456-9691. If you were prescribed naloxone, make sure you understand  how to take it. Contact a health care provider if you: Need help understanding how to take your pain medicines. Feel your medicines are too strong. Are concerned that your pain medicines are not working well for your pain. Develop new symptoms or side effects when you are taking medicines. Get help right away if: You or someone else is having symptoms of an opioid overdose. Get help even if you are not sure. You have serious thoughts about hurting yourself or others. You have: Chest pain. Difficulty breathing. A loss of consciousness. These symptoms may represent a serious problem that is an emergency. Do not wait to see if the symptoms will go away. Get medical help right away. Call your local emergency services (911 in the [place] Do not drive yourself to the hospital. If you ever feel like you may hurt yourself or others, or have thoughts about taking your own life, get help right away. You can go to your nearest emergency department or call: Your local emergency services (911 in the [place] A suicide crisis helpline, such as the [facility] at (737)361-7132. This is open 24 hours a day. Summary Opioids are drugs that are often used to treat pain. Opioids include illegal drugs, such as heroin, as well as prescription pain medicines. An opioid overdose happens when you take too much of an opioid. Overdoses can be intentional or accidental. Opioid overdose is very dangerous. It is a life-threatening emergency. If you or someone you know is experiencing an opioid overdose, get help right away. This information is not intended to replace advice  given to you by your health care provider. Make sure you discuss any questions you have with your healthcare provider. Document Revised: 01/08/2018 Document Reviewed: 01/08/2018 Else[employer] Patient Education  [place].

## 2020-09-20 ENCOUNTER — Telehealth: Payer: Self-pay

## 2020-09-20 NOTE — Telephone Encounter (Signed)
Post procedure phone call.  Patient states he is doing good.  

## 2020-09-21 ENCOUNTER — Encounter: Payer: PPO | Admitting: Pain Medicine

## 2020-09-21 MED FILL — Medication: INTRATHECAL | Qty: 1 | Status: AC

## 2020-10-03 DIAGNOSIS — Z930 Tracheostomy status: Secondary | ICD-10-CM | POA: Diagnosis not present

## 2020-10-03 DIAGNOSIS — Z7901 Long term (current) use of anticoagulants: Secondary | ICD-10-CM | POA: Diagnosis not present

## 2020-10-03 DIAGNOSIS — Z79899 Other long term (current) drug therapy: Secondary | ICD-10-CM | POA: Diagnosis not present

## 2020-10-03 DIAGNOSIS — R911 Solitary pulmonary nodule: Secondary | ICD-10-CM | POA: Diagnosis not present

## 2020-10-03 DIAGNOSIS — Z978 Presence of other specified devices: Secondary | ICD-10-CM | POA: Diagnosis not present

## 2020-10-03 DIAGNOSIS — C3411 Malignant neoplasm of upper lobe, right bronchus or lung: Secondary | ICD-10-CM | POA: Diagnosis not present

## 2020-10-03 DIAGNOSIS — I10 Essential (primary) hypertension: Secondary | ICD-10-CM | POA: Diagnosis not present

## 2020-10-03 DIAGNOSIS — I480 Paroxysmal atrial fibrillation: Secondary | ICD-10-CM | POA: Diagnosis not present

## 2020-10-03 DIAGNOSIS — E785 Hyperlipidemia, unspecified: Secondary | ICD-10-CM | POA: Diagnosis not present

## 2020-10-03 DIAGNOSIS — F17210 Nicotine dependence, cigarettes, uncomplicated: Secondary | ICD-10-CM | POA: Diagnosis not present

## 2020-10-03 DIAGNOSIS — Z85810 Personal history of malignant neoplasm of tongue: Secondary | ICD-10-CM | POA: Diagnosis not present

## 2020-10-03 DIAGNOSIS — J398 Other specified diseases of upper respiratory tract: Secondary | ICD-10-CM | POA: Diagnosis not present

## 2020-10-03 DIAGNOSIS — K219 Gastro-esophageal reflux disease without esophagitis: Secondary | ICD-10-CM | POA: Diagnosis not present

## 2020-10-03 DIAGNOSIS — E039 Hypothyroidism, unspecified: Secondary | ICD-10-CM | POA: Diagnosis not present

## 2020-10-04 DIAGNOSIS — I4891 Unspecified atrial fibrillation: Secondary | ICD-10-CM | POA: Diagnosis not present

## 2020-10-04 DIAGNOSIS — Z6820 Body mass index (BMI) 20.0-20.9, adult: Secondary | ICD-10-CM | POA: Diagnosis not present

## 2020-10-04 DIAGNOSIS — F17210 Nicotine dependence, cigarettes, uncomplicated: Secondary | ICD-10-CM | POA: Diagnosis not present

## 2020-10-04 DIAGNOSIS — J449 Chronic obstructive pulmonary disease, unspecified: Secondary | ICD-10-CM | POA: Diagnosis not present

## 2020-10-04 DIAGNOSIS — R109 Unspecified abdominal pain: Secondary | ICD-10-CM | POA: Diagnosis not present

## 2020-10-20 DIAGNOSIS — E039 Hypothyroidism, unspecified: Secondary | ICD-10-CM | POA: Diagnosis not present

## 2020-10-20 DIAGNOSIS — Z85810 Personal history of malignant neoplasm of tongue: Secondary | ICD-10-CM | POA: Diagnosis not present

## 2020-10-20 DIAGNOSIS — Z79899 Other long term (current) drug therapy: Secondary | ICD-10-CM | POA: Diagnosis not present

## 2020-10-20 DIAGNOSIS — R5383 Other fatigue: Secondary | ICD-10-CM | POA: Diagnosis not present

## 2020-10-20 DIAGNOSIS — R5382 Chronic fatigue, unspecified: Secondary | ICD-10-CM | POA: Diagnosis not present

## 2020-10-20 DIAGNOSIS — C029 Malignant neoplasm of tongue, unspecified: Secondary | ICD-10-CM | POA: Diagnosis not present

## 2020-10-20 DIAGNOSIS — C3411 Malignant neoplasm of upper lobe, right bronchus or lung: Secondary | ICD-10-CM | POA: Diagnosis not present

## 2020-10-20 DIAGNOSIS — G893 Neoplasm related pain (acute) (chronic): Secondary | ICD-10-CM | POA: Diagnosis not present

## 2020-10-25 ENCOUNTER — Telehealth: Payer: Self-pay

## 2020-10-25 DIAGNOSIS — Z981 Arthrodesis status: Secondary | ICD-10-CM | POA: Diagnosis not present

## 2020-10-25 DIAGNOSIS — I10 Essential (primary) hypertension: Secondary | ICD-10-CM | POA: Diagnosis not present

## 2020-10-25 DIAGNOSIS — S2241XA Multiple fractures of ribs, right side, initial encounter for closed fracture: Secondary | ICD-10-CM | POA: Diagnosis not present

## 2020-10-25 DIAGNOSIS — J984 Other disorders of lung: Secondary | ICD-10-CM | POA: Diagnosis not present

## 2020-10-25 DIAGNOSIS — Z8616 Personal history of COVID-19: Secondary | ICD-10-CM | POA: Diagnosis not present

## 2020-10-25 DIAGNOSIS — S12400A Unspecified displaced fracture of fifth cervical vertebra, initial encounter for closed fracture: Secondary | ICD-10-CM | POA: Diagnosis not present

## 2020-10-25 DIAGNOSIS — F17210 Nicotine dependence, cigarettes, uncomplicated: Secondary | ICD-10-CM | POA: Diagnosis not present

## 2020-10-25 DIAGNOSIS — R918 Other nonspecific abnormal finding of lung field: Secondary | ICD-10-CM | POA: Diagnosis not present

## 2020-10-25 DIAGNOSIS — S2231XA Fracture of one rib, right side, initial encounter for closed fracture: Secondary | ICD-10-CM | POA: Diagnosis not present

## 2020-10-25 DIAGNOSIS — G8929 Other chronic pain: Secondary | ICD-10-CM | POA: Diagnosis not present

## 2020-10-25 DIAGNOSIS — I2510 Atherosclerotic heart disease of native coronary artery without angina pectoris: Secondary | ICD-10-CM | POA: Diagnosis not present

## 2020-10-25 DIAGNOSIS — Z79899 Other long term (current) drug therapy: Secondary | ICD-10-CM | POA: Diagnosis not present

## 2020-10-25 DIAGNOSIS — S12500A Unspecified displaced fracture of sixth cervical vertebra, initial encounter for closed fracture: Secondary | ICD-10-CM | POA: Diagnosis not present

## 2020-10-25 DIAGNOSIS — R911 Solitary pulmonary nodule: Secondary | ICD-10-CM | POA: Diagnosis not present

## 2020-10-25 DIAGNOSIS — E7800 Pure hypercholesterolemia, unspecified: Secondary | ICD-10-CM | POA: Diagnosis not present

## 2020-10-25 DIAGNOSIS — Z85118 Personal history of other malignant neoplasm of bronchus and lung: Secondary | ICD-10-CM | POA: Diagnosis not present

## 2020-10-25 DIAGNOSIS — Z8546 Personal history of malignant neoplasm of prostate: Secondary | ICD-10-CM | POA: Diagnosis not present

## 2020-10-25 DIAGNOSIS — M542 Cervicalgia: Secondary | ICD-10-CM | POA: Diagnosis not present

## 2020-10-25 DIAGNOSIS — M189 Osteoarthritis of first carpometacarpal joint, unspecified: Secondary | ICD-10-CM | POA: Diagnosis not present

## 2020-10-25 DIAGNOSIS — Z5329 Procedure and treatment not carried out because of patient's decision for other reasons: Secondary | ICD-10-CM | POA: Diagnosis not present

## 2020-10-25 DIAGNOSIS — S20211A Contusion of right front wall of thorax, initial encounter: Secondary | ICD-10-CM | POA: Diagnosis not present

## 2020-10-25 DIAGNOSIS — M7989 Other specified soft tissue disorders: Secondary | ICD-10-CM | POA: Diagnosis not present

## 2020-10-25 DIAGNOSIS — S6991XA Unspecified injury of right wrist, hand and finger(s), initial encounter: Secondary | ICD-10-CM | POA: Diagnosis not present

## 2020-10-25 DIAGNOSIS — J449 Chronic obstructive pulmonary disease, unspecified: Secondary | ICD-10-CM | POA: Diagnosis not present

## 2020-10-25 NOTE — Telephone Encounter (Signed)
Darrell White in [place] at [facility] called enquiring about an MRI on patient with him having an IT pump.  Information, telemetry sheet sent to them via fax.  Informed Darrell White that the patient will need to have his pump function checked after the MRI.

## 2020-10-26 DIAGNOSIS — C029 Malignant neoplasm of tongue, unspecified: Secondary | ICD-10-CM | POA: Diagnosis not present

## 2020-10-26 DIAGNOSIS — C3491 Malignant neoplasm of unspecified part of right bronchus or lung: Secondary | ICD-10-CM | POA: Diagnosis not present

## 2020-10-31 DIAGNOSIS — C7801 Secondary malignant neoplasm of right lung: Secondary | ICD-10-CM | POA: Diagnosis not present

## 2020-10-31 DIAGNOSIS — Z85810 Personal history of malignant neoplasm of tongue: Secondary | ICD-10-CM | POA: Diagnosis not present

## 2020-10-31 DIAGNOSIS — C029 Malignant neoplasm of tongue, unspecified: Secondary | ICD-10-CM | POA: Diagnosis not present

## 2020-10-31 DIAGNOSIS — C300 Malignant neoplasm of nasal cavity: Secondary | ICD-10-CM | POA: Diagnosis not present

## 2020-10-31 DIAGNOSIS — Z931 Gastrostomy status: Secondary | ICD-10-CM | POA: Diagnosis not present

## 2020-10-31 DIAGNOSIS — Z87891 Personal history of nicotine dependence: Secondary | ICD-10-CM | POA: Diagnosis not present

## 2020-10-31 DIAGNOSIS — Z930 Tracheostomy status: Secondary | ICD-10-CM | POA: Diagnosis not present

## 2020-10-31 DIAGNOSIS — Z923 Personal history of irradiation: Secondary | ICD-10-CM | POA: Diagnosis not present

## 2020-10-31 DIAGNOSIS — Z9889 Other specified postprocedural states: Secondary | ICD-10-CM | POA: Diagnosis not present

## 2020-10-31 DIAGNOSIS — F17210 Nicotine dependence, cigarettes, uncomplicated: Secondary | ICD-10-CM | POA: Diagnosis not present

## 2020-10-31 DIAGNOSIS — R471 Dysarthria and anarthria: Secondary | ICD-10-CM | POA: Diagnosis not present

## 2020-10-31 DIAGNOSIS — C321 Malignant neoplasm of supraglottis: Secondary | ICD-10-CM | POA: Diagnosis not present

## 2020-11-01 DIAGNOSIS — E039 Hypothyroidism, unspecified: Secondary | ICD-10-CM | POA: Diagnosis not present

## 2020-11-01 DIAGNOSIS — C3491 Malignant neoplasm of unspecified part of right bronchus or lung: Secondary | ICD-10-CM | POA: Diagnosis not present

## 2020-11-01 DIAGNOSIS — C029 Malignant neoplasm of tongue, unspecified: Secondary | ICD-10-CM | POA: Diagnosis not present

## 2020-11-01 DIAGNOSIS — G893 Neoplasm related pain (acute) (chronic): Secondary | ICD-10-CM | POA: Diagnosis not present

## 2020-11-01 DIAGNOSIS — R5383 Other fatigue: Secondary | ICD-10-CM | POA: Diagnosis not present

## 2020-11-07 ENCOUNTER — Other Ambulatory Visit: Payer: Self-pay

## 2020-11-07 MED ORDER — PAIN MANAGEMENT IT PUMP REFILL: 1.0000 | Freq: Once | INTRATHECAL | 0 refills | Status: AC

## 2020-11-08 ENCOUNTER — Other Ambulatory Visit: Payer: PPO

## 2020-11-08 DIAGNOSIS — Z85118 Personal history of other malignant neoplasm of bronchus and lung: Secondary | ICD-10-CM | POA: Diagnosis not present

## 2020-11-08 DIAGNOSIS — R55 Syncope and collapse: Secondary | ICD-10-CM | POA: Diagnosis not present

## 2020-11-08 DIAGNOSIS — F17210 Nicotine dependence, cigarettes, uncomplicated: Secondary | ICD-10-CM | POA: Diagnosis not present

## 2020-11-08 DIAGNOSIS — W19XXXA Unspecified fall, initial encounter: Secondary | ICD-10-CM | POA: Diagnosis not present

## 2020-11-08 DIAGNOSIS — I959 Hypotension, unspecified: Secondary | ICD-10-CM | POA: Diagnosis not present

## 2020-11-08 DIAGNOSIS — R109 Unspecified abdominal pain: Secondary | ICD-10-CM | POA: Diagnosis not present

## 2020-11-08 DIAGNOSIS — W1839XA Other fall on same level, initial encounter: Secondary | ICD-10-CM | POA: Diagnosis not present

## 2020-11-08 DIAGNOSIS — S12500A Unspecified displaced fracture of sixth cervical vertebra, initial encounter for closed fracture: Secondary | ICD-10-CM | POA: Diagnosis not present

## 2020-11-08 DIAGNOSIS — R4182 Altered mental status, unspecified: Secondary | ICD-10-CM | POA: Diagnosis not present

## 2020-11-08 DIAGNOSIS — E7800 Pure hypercholesterolemia, unspecified: Secondary | ICD-10-CM | POA: Diagnosis not present

## 2020-11-08 DIAGNOSIS — I2510 Atherosclerotic heart disease of native coronary artery without angina pectoris: Secondary | ICD-10-CM | POA: Diagnosis not present

## 2020-11-08 DIAGNOSIS — R911 Solitary pulmonary nodule: Secondary | ICD-10-CM | POA: Diagnosis not present

## 2020-11-08 DIAGNOSIS — K219 Gastro-esophageal reflux disease without esophagitis: Secondary | ICD-10-CM | POA: Diagnosis not present

## 2020-11-08 DIAGNOSIS — S0083XA Contusion of other part of head, initial encounter: Secondary | ICD-10-CM | POA: Diagnosis not present

## 2020-11-08 DIAGNOSIS — N400 Enlarged prostate without lower urinary tract symptoms: Secondary | ICD-10-CM | POA: Diagnosis not present

## 2020-11-08 DIAGNOSIS — E7849 Other hyperlipidemia: Secondary | ICD-10-CM | POA: Diagnosis not present

## 2020-11-08 DIAGNOSIS — S12601A Unspecified nondisplaced fracture of seventh cervical vertebra, initial encounter for closed fracture: Secondary | ICD-10-CM | POA: Diagnosis not present

## 2020-11-08 DIAGNOSIS — Z8546 Personal history of malignant neoplasm of prostate: Secondary | ICD-10-CM | POA: Diagnosis not present

## 2020-11-08 DIAGNOSIS — S0990XA Unspecified injury of head, initial encounter: Secondary | ICD-10-CM | POA: Diagnosis not present

## 2020-11-08 DIAGNOSIS — E039 Hypothyroidism, unspecified: Secondary | ICD-10-CM | POA: Diagnosis not present

## 2020-11-08 DIAGNOSIS — J449 Chronic obstructive pulmonary disease, unspecified: Secondary | ICD-10-CM | POA: Diagnosis not present

## 2020-11-08 DIAGNOSIS — S129XXA Fracture of neck, unspecified, initial encounter: Secondary | ICD-10-CM | POA: Diagnosis not present

## 2020-11-08 DIAGNOSIS — I10 Essential (primary) hypertension: Secondary | ICD-10-CM | POA: Diagnosis not present

## 2020-11-08 DIAGNOSIS — G8929 Other chronic pain: Secondary | ICD-10-CM | POA: Diagnosis not present

## 2020-11-08 DIAGNOSIS — Z8616 Personal history of COVID-19: Secondary | ICD-10-CM | POA: Diagnosis not present

## 2020-11-08 DIAGNOSIS — E782 Mixed hyperlipidemia: Secondary | ICD-10-CM | POA: Diagnosis not present

## 2020-11-08 DIAGNOSIS — R9431 Abnormal electrocardiogram [ECG] [EKG]: Secondary | ICD-10-CM | POA: Diagnosis not present

## 2020-11-09 DIAGNOSIS — S12690A Other displaced fracture of seventh cervical vertebra, initial encounter for closed fracture: Secondary | ICD-10-CM | POA: Diagnosis not present

## 2020-11-09 DIAGNOSIS — R0781 Pleurodynia: Secondary | ICD-10-CM | POA: Diagnosis not present

## 2020-11-09 DIAGNOSIS — C321 Malignant neoplasm of supraglottis: Secondary | ICD-10-CM | POA: Diagnosis not present

## 2020-11-09 DIAGNOSIS — R188 Other ascites: Secondary | ICD-10-CM | POA: Diagnosis not present

## 2020-11-09 DIAGNOSIS — C029 Malignant neoplasm of tongue, unspecified: Secondary | ICD-10-CM | POA: Diagnosis not present

## 2020-11-09 DIAGNOSIS — S12600A Unspecified displaced fracture of seventh cervical vertebra, initial encounter for closed fracture: Secondary | ICD-10-CM | POA: Diagnosis not present

## 2020-11-09 DIAGNOSIS — R590 Localized enlarged lymph nodes: Secondary | ICD-10-CM | POA: Diagnosis not present

## 2020-11-09 DIAGNOSIS — S129XXA Fracture of neck, unspecified, initial encounter: Secondary | ICD-10-CM | POA: Diagnosis not present

## 2020-11-09 DIAGNOSIS — Y999 Unspecified external cause status: Secondary | ICD-10-CM | POA: Diagnosis not present

## 2020-11-09 DIAGNOSIS — M899 Disorder of bone, unspecified: Secondary | ICD-10-CM | POA: Diagnosis not present

## 2020-11-09 DIAGNOSIS — J90 Pleural effusion, not elsewhere classified: Secondary | ICD-10-CM | POA: Diagnosis not present

## 2020-11-09 DIAGNOSIS — F17210 Nicotine dependence, cigarettes, uncomplicated: Secondary | ICD-10-CM | POA: Diagnosis not present

## 2020-11-09 DIAGNOSIS — R918 Other nonspecific abnormal finding of lung field: Secondary | ICD-10-CM | POA: Diagnosis not present

## 2020-11-09 DIAGNOSIS — Z5329 Procedure and treatment not carried out because of patient's decision for other reasons: Secondary | ICD-10-CM | POA: Diagnosis not present

## 2020-11-09 DIAGNOSIS — W1839XA Other fall on same level, initial encounter: Secondary | ICD-10-CM | POA: Diagnosis not present

## 2020-11-09 DIAGNOSIS — Z5320 Procedure and treatment not carried out because of patient's decision for unspecified reasons: Secondary | ICD-10-CM | POA: Diagnosis not present

## 2020-11-09 DIAGNOSIS — M546 Pain in thoracic spine: Secondary | ICD-10-CM | POA: Diagnosis not present

## 2020-11-09 DIAGNOSIS — C44321 Squamous cell carcinoma of skin of nose: Secondary | ICD-10-CM | POA: Diagnosis not present

## 2020-11-13 DIAGNOSIS — M542 Cervicalgia: Secondary | ICD-10-CM | POA: Diagnosis not present

## 2020-11-15 ENCOUNTER — Ambulatory Visit: Payer: PPO | Admitting: Urology

## 2020-11-15 DIAGNOSIS — C61 Malignant neoplasm of prostate: Secondary | ICD-10-CM

## 2020-11-15 DIAGNOSIS — R972 Elevated prostate specific antigen [PSA]: Secondary | ICD-10-CM

## 2020-11-28 DIAGNOSIS — R7989 Other specified abnormal findings of blood chemistry: Secondary | ICD-10-CM | POA: Diagnosis not present

## 2020-11-28 DIAGNOSIS — C01 Malignant neoplasm of base of tongue: Secondary | ICD-10-CM | POA: Diagnosis not present

## 2020-11-28 DIAGNOSIS — E032 Hypothyroidism due to medicaments and other exogenous substances: Secondary | ICD-10-CM | POA: Diagnosis not present

## 2020-11-28 DIAGNOSIS — C029 Malignant neoplasm of tongue, unspecified: Secondary | ICD-10-CM | POA: Diagnosis not present

## 2020-11-28 DIAGNOSIS — C3411 Malignant neoplasm of upper lobe, right bronchus or lung: Secondary | ICD-10-CM | POA: Diagnosis not present

## 2020-11-28 DIAGNOSIS — C44321 Squamous cell carcinoma of skin of nose: Secondary | ICD-10-CM | POA: Diagnosis not present

## 2020-12-04 DIAGNOSIS — T85610A Breakdown (mechanical) of epidural and subdural infusion catheter, initial encounter: Secondary | ICD-10-CM | POA: Insufficient documentation

## 2020-12-04 NOTE — Progress Notes (Deleted)
PROVIDER NOTE: Information contained herein reflects review and annotations entered in association with encounter. Interpretation of such information and data should be left to medically-trained personnel. Information provided to patient can be located elsewhere in the medical record under "Patient Instructions". Document created using STT-dictation technology, any transcriptional errors that may result from process are unintentional.    Patient: Darrell White  Service Category: Procedure Provider: Gaspar Cola, MD DOB: 21-Jan-1951 DOS: 12/05/2020 Location: [facility] MRN: [id number] Setting: Ambulatory - outpatient Referring Provider: [name], MD Type: Established Patient Specialty: Interventional Pain Management PCP: Darrell Hilding, MD  Primary Reason for Visit: Interventional Pain Management Treatment. CC: No chief complaint on file.  Procedure:          Type: Management of Intrathecal Drug Delivery System (IDDS) - Reservoir Refill [id number]). No rate change.  Indications: No diagnosis found. Pain Assessment: Self-Reported Pain Score:  /10             Reported level is compatible with observation.        RTCB: 03/19/2021   Intrathecal Drug Delivery System (IDDS)  Pump Device:  Manufacturer: Medtronic Model: Synchromed II Model No.: [id number] Serial No.: [id number] H Delivery Route: Intrathecal Type: Programmable  Volume (mL): 40 mL reservoir Priming Volume: n/a  Calibration Constant: 113.0  MRI compatibility: Conditional   Implant Details:  Date: 08/02/2019  Implanter: Dr. Patrcia White Information: [facility] Last Revision/Replacement: 08/02/2019 Estimated Replacement Date: Mar 2028  Implant Site: Abdominal Laterality: Right  Catheter: Manufacturer: Medtronic Model: InDura Model No.: [id number]  Serial No.: n/a  Implanted Length (cm): 89.0  Catheter Volume (mL): 0.196  Tip Location (Level): Catheter found to be broken on  12/28/2004 Lumbar Spine X-rays. Canal Access Site: n/a    Drug content:  Primary Medication Class: Opioid  Medication: PF-Morphine  Concentration: 20 mg/mL   Secondary Medication Class: Local Anesthetic  Medication: PF-Bupivacaine  Concentration: 20 mg/mL   Tertiary Medication Class: none   PA parameters (PCA-mode):  Mode: Off (Inactive)  Programming:  Type: Simple continuous.  Medication, Concentration, Infusion Program, & Delivery Rate: For up-to-date details please see most recent scanned programming printout.   Changes:  Medication Change: None at this point Rate Change: No change in rate  Reported side-effects or adverse reactions: None reported  Effectiveness: Described as relatively effective, allowing for increase in activities of daily living (ADL) Clinically meaningful improvement in function (CMIF): Sustained CMIF goals met  Plan: Pump refill today   Pharmacotherapy Assessment   Analgesic: Oxycodone IR 10 mg, 1 tabl PO q 6 hrs (40 mg/day of oxycodone) MME/day: 60 mg/day.   Monitoring: [place] PMP: PDMP reviewed during this encounter.       Pharmacotherapy: No side-effects or adverse reactions reported. Compliance: No problems identified. Effectiveness: Clinically acceptable. Plan: Refer to "POC". UDS:  Summary  Date Value Ref Range Status  03/26/2018 FINAL  Final    Comment:    ==================================================================== TOXASSURE SELECT 13 (MW) ==================================================================== Test                             Result       Flag       Units Drug Present and Declared for Prescription Verification   Oxycodone                      1772         EXPECTED   ng/mg  creat   Oxymorphone                    >4115        EXPECTED   ng/mg creat   Noroxycodone                   >4115        EXPECTED   ng/mg creat   Noroxymorphone                 1803         EXPECTED   ng/mg creat    Sources of oxycodone are  scheduled prescription medications.    Oxymorphone, noroxycodone, and noroxymorphone are expected    metabolites of oxycodone. Oxymorphone is also available as a    scheduled prescription medication. Drug Present not Declared for Prescription Verification   Morphine                       1085         UNEXPECTED ng/mg creat    Potential sources of large amounts of morphine in the absence of    codeine include administration of morphine or use of heroin. ==================================================================== Test                      Result    Flag   Units      Ref Range   Creatinine              243              mg/dL      >=02 ==================================================================== Declared Medications:  The flagging and interpretation on this report are based on the  following declared medications.  Unexpected results may arise from  inaccuracies in the declared medications.  **Note: The testing scope of this panel includes these medications:  Oxycodone  **Note: The testing scope of this panel does not include following  reported medications:  Acetaminophen (Tylenol)  Atorvastatin (Lipitor)  Docusate  Gabapentin (Neurontin)  Levothyroxine  Mirtazapine  Omeprazole  Sotalol  Tiotropium  Trazodone ==================================================================== For clinical consultation, please call [phone]. ====================================================================      Pre-op H&P Assessment:  Darrell White is a 70 y.o. (year old), male patient, seen today for interventional treatment. He  has a past surgical history that includes Mouth surgery; Cervical disc surgery; Shoulder arthroscopy; Back surgery; Spinal fusion; Spine surgery; Knee arthroscopy; Cataract extraction w/PHACO (Left, 04/09/2012); Direct laryngoscopy (N/A, 03/26/2016); Excision of tongue lesion (Right, 03/26/2016); OTHER SURGICAL HISTORY; CT LUNG SCREENING (06/16/2017);  Direct laryngoscopy (N/A, 05/31/2019); and Intrathecal pump implant (Right, 08/02/2019). Darrell White has a current medication list which includes the following prescription(s): albuterol, amiodarone, eliquis, atorvastatin, clonidine, diclofenac sodium, gabapentin, levofloxacin, levothyroxine, methocarbamol, metoprolol succinate, mirtazapine, naloxone, narcan, oxycodone hcl, oxycodone hcl, oxycodone hcl, pantoprazole, sotalol, tiotropium, and trazodone. His primarily concern today is the No chief complaint on file.  Initial Vital Signs:  Pulse/HCG Rate:    Temp:   Resp:   BP:   SpO2:    BMI: Estimated body mass index is 20.53 kg/m as calculated from the following:   Height as of 09/19/20: 5\' 8"  (1.727 m).   Weight as of 09/19/20: 135 lb (61.2 kg).  Risk Assessment: Allergies: Reviewed. He is allergic to nsaids.  Allergy Precautions: None required Coagulopathies: Reviewed. None identified.  Blood-thinner therapy: None at this time Active Infection(s): Reviewed. None identified. Mr. Cromie is afebrile  Site Confirmation: Mr. Stonebraker was asked to confirm the procedure and laterality before marking the site Procedure checklist: Completed Consent: Before the procedure and under the influence of no sedative(s), amnesic(s), or anxiolytics, the patient was informed of the treatment options, risks and possible complications. To fulfill our ethical and legal obligations, as recommended by the American Medical Association's Code of Ethics, I have informed the patient of my clinical impression; the nature and purpose of the treatment or procedure; the risks, benefits, and possible complications of the intervention; the alternatives, including doing nothing; the risk(s) and benefit(s) of the alternative treatment(s) or procedure(s); and the risk(s) and benefit(s) of doing nothing.  Mr. Lehrmann was provided with information about the general risks and possible complications associated with most interventional  procedures. These include, but are not limited to: failure to achieve desired goals, infection, bleeding, organ or nerve damage, allergic reactions, paralysis, and/or death.  In addition, he was informed of those risks and possible complications associated to this particular procedure, which include, but are not limited to: damage to the implant; failure to decrease pain; local, systemic, or serious CNS infections, intraspinal abscess with possible cord compression and paralysis, or life-threatening such as meningitis; bleeding; organ damage; nerve injury or damage with subsequent sensory, motor, and/or autonomic system dysfunction, resulting in transient or permanent pain, numbness, and/or weakness of one or several areas of the body; allergic reactions, either minor or major life-threatening, such as anaphylactic or anaphylactoid reactions.  Furthermore, Mr. Leventhal was informed of those risks and complications associated with the medications. These include, but are not limited to: allergic reactions (i.e.: anaphylactic or anaphylactoid reactions); endorphine suppression; bradycardia and/or hypotension; water retention and/or peripheral vascular relaxation leading to lower extremity edema and possible stasis ulcers; respiratory depression and/or shortness of breath; decreased metabolic rate leading to weight gain; swelling or edema; medication-induced neural toxicity; particulate matter embolism and blood vessel occlusion with resultant organ, and/or nervous system infarction; and/or intrathecal granuloma formation with possible spinal cord compression and permanent paralysis.  Before refilling the pump Mr. Wittmeyer was informed that some of the medications used in the devise may not be FDA approved for such use and therefore it constitutes an off-label use of the medications.  Finally, he was informed that Medicine is not an exact science; therefore, there is also the possibility of unforeseen or unpredictable  risks and/or possible complications that may result in a catastrophic outcome. The patient indicated having understood very clearly. We have given the patient no guarantees and we have made no promises. Enough time was given to the patient to ask questions, all of which were answered to the patient's satisfaction. Mr. Doo has indicated that he wanted to continue with the procedure. Attestation: I, the ordering provider, attest that I have discussed with the patient the benefits, risks, side-effects, alternatives, likelihood of achieving goals, and potential problems during recovery for the procedure that I have provided informed consent. Date  Time: {CHL [facility]-PAIN TIME CHOICES:[id number]}  Pre-Procedure Preparation:  Monitoring: As per clinic protocol. Respiration, ETCO2, SpO2, BP, heart rate and rhythm monitor placed and checked for adequate function Safety Precautions: Patient was assessed for positional comfort and pressure points before starting the procedure. Time-out: I initiated and conducted the "Time-out" before starting the procedure, as per protocol. The patient was asked to participate by confirming the accuracy of the "Time Out" information. Verification of the correct person, site, and procedure were performed and confirmed by me, the nursing staff, and the patient. "Time-out" conducted as  per Joint Commission's Universal Protocol (UP.01.01.01). Time:    Description of Procedure:          Position: Supine Target Area: Central-port of intrathecal pump. Approach: Anterior, 90 degree angle approach. Area Prepped: Entire Area around the pump implant. DuraPrep (Iodine Povacrylex [0.7% available iodine] and Isopropyl Alcohol, 74% w/w) Safety Precautions: Aspiration looking for blood return was conducted prior to all injections. At no point did we inject any substances, as a needle was being advanced. No attempts were made at seeking any paresthesias. Safe injection practices and needle  disposal techniques used. Medications properly checked for expiration dates. SDV (single dose vial) medications used. Description of the Procedure: Protocol guidelines were followed. Two nurses trained to do implant refills were present during the entire procedure. The refill medication was checked by both healthcare providers as well as the patient. The patient was included in the "Time-out" to verify the medication. The patient was placed in position. The pump was identified. The area was prepped in the usual manner. The sterile template was positioned over the pump, making sure the side-port location matched that of the pump. Both, the pump and the template were held for stability. The needle provided in the Medtronic Kit was then introduced thru the center of the template and into the central port. The pump content was aspirated and discarded volume documented. The new medication was slowly infused into the pump, thru the filter, making sure to avoid overpressure of the device. The needle was then removed and the area cleansed, making sure to leave some of the prepping solution back to take advantage of its long term bactericidal properties. The pump was interrogated and programmed to reflect the correct medication, volume, and dosage. The program was printed and taken to the physician for approval. Once checked and signed by the physician, a copy was provided to the patient and another scanned into the EMR.  There were no vitals filed for this visit.  Start Time:   hrs. End Time:   hrs. Materials & Medications: Medtronic Refill Kit Medication(s): Please see chart orders for details.  Imaging Guidance:          Type of Imaging Technique: None used Indication(s): N/A Exposure Time: No patient exposure Contrast: None used. Fluoroscopic Guidance: N/A Ultrasound Guidance: N/A Interpretation: N/A  Antibiotic Prophylaxis:   Anti-infectives (From admission, onward)    None      Indication(s):  None identified  Post-operative Assessment:  Post-procedure Vital Signs:  Pulse/HCG Rate:    Temp:   Resp:   BP:   SpO2:    EBL: None  Complications: No immediate post-treatment complications observed by team, or reported by patient.  Note: The patient tolerated the entire procedure well. A repeat set of vitals were taken after the procedure and the patient was kept under observation following institutional policy, for this type of procedure. Post-procedural neurological assessment was performed, showing return to baseline, prior to discharge. The patient was provided with post-procedure discharge instructions, including a section on how to identify potential problems. Should any problems arise concerning this procedure, the patient was given instructions to immediately contact us, at any time, without hesitation. In any case, we plan to contact the patient by telephone for a follow-up status report regarding this interventional procedure.  Comments:  No additional relevant information.  Plan of Care  Orders:  No orders of the defined types were placed in this encounter.  Chronic Opioid Analgesic:  Oxycodone IR 10 mg, 1 tabl PO q  6 hrs (40 mg/day of oxycodone) MME/day: 60 mg/day.   Medications ordered for procedure: No orders of the defined types were placed in this encounter.  Medications administered: Darrell White had no medications administered during this visit.  See the medical record for exact dosing, route, and time of administration.  Follow-up plan:   No follow-ups on file.      Interventional Therapies  Risk  Complexity Considerations:   Estimated body mass index is 20.53 kg/m as calculated from the following:   Height as of 09/19/20: $RemoveBef'5\' 8"'ZffgenJeyd$  (1.727 m).   Weight as of 09/19/20: 135 lb (61.2 kg). WNL   Planned  Pending:   Palliative intrathecal pump management    Under consideration:   Diagnostic bilateral IA hip joint injection  Diagnostic bilateral lumbar  facet block #1  Possible bilateral lumbar facet RFA  Diagnostic caudal ESI + diagnostic epidurogram  Possible Racz procedure  Diagnostic left cervical ESI  Diagnostic left cervical facet block  Possible left cervical facet RFA    Completed:   Diagnostic left IA shoulder (glenohumeral and AC joint) injection x1 (100/100/100/100)   Therapeutic  Palliative (PRN) options:   Diagnostic left IA shoulder (glenohumeral and AC joint) injection #2  Therapeutic/palliative intrathecal pump refill(s)     Recent Visits Date Type Provider Dept  09/19/20 Procedure visit Milinda Pointer, MD [facility]  Showing recent visits within past 90 days and meeting all other requirements Future Appointments Date Type Provider Dept  12/05/20 Appointment Milinda Pointer, MD [facility]  Showing future appointments within next 90 days and meeting all other requirements  Disposition: Discharge home  Discharge (Date  Time): 12/05/2020;   hrs.   Primary Care Physician: Darrell Hilding, MD Location: [facility] Note by: Darrell Cola, MD Date: 12/05/2020; Time: 7:09 AM  Disclaimer:  Medicine is not an Chief Strategy Officer. The only guarantee in medicine is that nothing is guaranteed. It is important to note that the decision to proceed with this intervention was based on the information collected from the patient. The Data and conclusions were drawn from the patient's questionnaire, the interview, and the physical examination. Because the information was provided in large part by the patient, it cannot be guaranteed that it has not been purposely or unconsciously manipulated. Every effort has been made to obtain as much relevant data as possible for this evaluation. It is important to note that the conclusions that lead to this procedure are derived in large part from the available data. Always take into account that the treatment will also be dependent on  availability of resources and existing treatment guidelines, considered by other Pain Management Practitioners as being common knowledge and practice, at the time of the intervention. For Medico-Legal purposes, it is also important to point out that variation in procedural techniques and pharmacological choices are the acceptable norm. The indications, contraindications, technique, and results of the above procedure should only be interpreted and judged by a Board-Certified Interventional Pain Specialist with extensive familiarity and expertise in the same exact procedure and technique.

## 2020-12-05 ENCOUNTER — Telehealth: Payer: Self-pay

## 2020-12-05 ENCOUNTER — Encounter: Payer: PPO | Admitting: Pain Medicine

## 2020-12-05 DIAGNOSIS — C01 Malignant neoplasm of base of tongue: Secondary | ICD-10-CM | POA: Diagnosis not present

## 2020-12-05 DIAGNOSIS — Z79891 Long term (current) use of opiate analgesic: Secondary | ICD-10-CM

## 2020-12-05 DIAGNOSIS — Z5189 Encounter for other specified aftercare: Secondary | ICD-10-CM

## 2020-12-05 DIAGNOSIS — C61 Malignant neoplasm of prostate: Secondary | ICD-10-CM

## 2020-12-05 DIAGNOSIS — M5136 Other intervertebral disc degeneration, lumbar region: Secondary | ICD-10-CM

## 2020-12-05 DIAGNOSIS — Z95828 Presence of other vascular implants and grafts: Secondary | ICD-10-CM

## 2020-12-05 DIAGNOSIS — M961 Postlaminectomy syndrome, not elsewhere classified: Secondary | ICD-10-CM

## 2020-12-05 DIAGNOSIS — Z1159 Encounter for screening for other viral diseases: Secondary | ICD-10-CM | POA: Diagnosis not present

## 2020-12-05 DIAGNOSIS — R9389 Abnormal findings on diagnostic imaging of other specified body structures: Secondary | ICD-10-CM | POA: Diagnosis not present

## 2020-12-05 DIAGNOSIS — Z978 Presence of other specified devices: Secondary | ICD-10-CM

## 2020-12-05 DIAGNOSIS — G893 Neoplasm related pain (acute) (chronic): Secondary | ICD-10-CM

## 2020-12-05 DIAGNOSIS — G894 Chronic pain syndrome: Secondary | ICD-10-CM

## 2020-12-05 DIAGNOSIS — T85610S Breakdown (mechanical) of epidural and subdural infusion catheter, sequela: Secondary | ICD-10-CM

## 2020-12-05 DIAGNOSIS — K769 Liver disease, unspecified: Secondary | ICD-10-CM | POA: Diagnosis not present

## 2020-12-05 DIAGNOSIS — G8929 Other chronic pain: Secondary | ICD-10-CM

## 2020-12-05 DIAGNOSIS — Z451 Encounter for adjustment and management of infusion pump: Secondary | ICD-10-CM

## 2020-12-05 DIAGNOSIS — Z79899 Other long term (current) drug therapy: Secondary | ICD-10-CM

## 2020-12-06 NOTE — Telephone Encounter (Signed)
R/S pump to first of December

## 2020-12-13 DIAGNOSIS — Z430 Encounter for attention to tracheostomy: Secondary | ICD-10-CM | POA: Diagnosis not present

## 2020-12-13 DIAGNOSIS — C140 Malignant neoplasm of pharynx, unspecified: Secondary | ICD-10-CM | POA: Diagnosis not present

## 2020-12-13 DIAGNOSIS — I2699 Other pulmonary embolism without acute cor pulmonale: Secondary | ICD-10-CM | POA: Diagnosis not present

## 2020-12-13 DIAGNOSIS — C3490 Malignant neoplasm of unspecified part of unspecified bronchus or lung: Secondary | ICD-10-CM | POA: Diagnosis not present

## 2020-12-13 DIAGNOSIS — R0902 Hypoxemia: Secondary | ICD-10-CM | POA: Diagnosis not present

## 2020-12-13 DIAGNOSIS — C029 Malignant neoplasm of tongue, unspecified: Secondary | ICD-10-CM | POA: Diagnosis not present

## 2020-12-13 DIAGNOSIS — Z5329 Procedure and treatment not carried out because of patient's decision for other reasons: Secondary | ICD-10-CM | POA: Diagnosis not present

## 2020-12-13 DIAGNOSIS — R531 Weakness: Secondary | ICD-10-CM | POA: Diagnosis not present

## 2020-12-13 DIAGNOSIS — J90 Pleural effusion, not elsewhere classified: Secondary | ICD-10-CM | POA: Diagnosis not present

## 2020-12-13 DIAGNOSIS — F17210 Nicotine dependence, cigarettes, uncomplicated: Secondary | ICD-10-CM | POA: Diagnosis not present

## 2020-12-13 DIAGNOSIS — R109 Unspecified abdominal pain: Secondary | ICD-10-CM | POA: Diagnosis not present

## 2020-12-13 DIAGNOSIS — Z720 Tobacco use: Secondary | ICD-10-CM | POA: Diagnosis not present

## 2020-12-13 DIAGNOSIS — R188 Other ascites: Secondary | ICD-10-CM | POA: Diagnosis not present

## 2020-12-13 DIAGNOSIS — Z20822 Contact with and (suspected) exposure to covid-19: Secondary | ICD-10-CM | POA: Diagnosis not present

## 2020-12-13 DIAGNOSIS — J449 Chronic obstructive pulmonary disease, unspecified: Secondary | ICD-10-CM | POA: Diagnosis not present

## 2020-12-13 DIAGNOSIS — I509 Heart failure, unspecified: Secondary | ICD-10-CM | POA: Diagnosis not present

## 2020-12-13 DIAGNOSIS — J189 Pneumonia, unspecified organism: Secondary | ICD-10-CM | POA: Diagnosis not present

## 2020-12-13 DIAGNOSIS — R059 Cough, unspecified: Secondary | ICD-10-CM | POA: Diagnosis not present

## 2020-12-13 DIAGNOSIS — I110 Hypertensive heart disease with heart failure: Secondary | ICD-10-CM | POA: Diagnosis not present

## 2020-12-13 DIAGNOSIS — Z931 Gastrostomy status: Secondary | ICD-10-CM | POA: Diagnosis not present

## 2020-12-14 ENCOUNTER — Other Ambulatory Visit: Payer: Self-pay

## 2020-12-14 DIAGNOSIS — R180 Malignant ascites: Secondary | ICD-10-CM | POA: Diagnosis not present

## 2020-12-14 DIAGNOSIS — C799 Secondary malignant neoplasm of unspecified site: Secondary | ICD-10-CM | POA: Diagnosis not present

## 2020-12-14 DIAGNOSIS — C3411 Malignant neoplasm of upper lobe, right bronchus or lung: Secondary | ICD-10-CM | POA: Diagnosis not present

## 2020-12-14 DIAGNOSIS — S12691D Other nondisplaced fracture of seventh cervical vertebra, subsequent encounter for fracture with routine healing: Secondary | ICD-10-CM | POA: Diagnosis not present

## 2020-12-14 DIAGNOSIS — C01 Malignant neoplasm of base of tongue: Secondary | ICD-10-CM | POA: Diagnosis not present

## 2020-12-14 DIAGNOSIS — R634 Abnormal weight loss: Secondary | ICD-10-CM | POA: Diagnosis not present

## 2020-12-14 DIAGNOSIS — J910 Malignant pleural effusion: Secondary | ICD-10-CM | POA: Diagnosis not present

## 2020-12-14 DIAGNOSIS — C029 Malignant neoplasm of tongue, unspecified: Secondary | ICD-10-CM | POA: Diagnosis not present

## 2020-12-14 DIAGNOSIS — C44321 Squamous cell carcinoma of skin of nose: Secondary | ICD-10-CM | POA: Diagnosis not present

## 2020-12-14 MED ORDER — PAIN MANAGEMENT IT PUMP REFILL: 1.0000 | Freq: Once | INTRATHECAL | 0 refills | Status: AC

## 2020-12-19 DIAGNOSIS — R1310 Dysphagia, unspecified: Secondary | ICD-10-CM | POA: Diagnosis not present

## 2020-12-19 DIAGNOSIS — C029 Malignant neoplasm of tongue, unspecified: Secondary | ICD-10-CM | POA: Diagnosis not present

## 2020-12-19 DIAGNOSIS — Z931 Gastrostomy status: Secondary | ICD-10-CM | POA: Diagnosis not present

## 2020-12-21 ENCOUNTER — Encounter (HOSPITAL_COMMUNITY): Payer: Self-pay | Admitting: Emergency Medicine

## 2020-12-21 ENCOUNTER — Emergency Department (HOSPITAL_COMMUNITY): Payer: PPO

## 2020-12-21 ENCOUNTER — Emergency Department (HOSPITAL_COMMUNITY)
Admission: EM | Admit: 2020-12-21 | Discharge: 2020-12-22 | Disposition: A | Discharge status: Home / Self Care | Payer: PPO | Attending: Emergency Medicine | Admitting: Emergency Medicine

## 2020-12-21 DIAGNOSIS — R109 Unspecified abdominal pain: Secondary | ICD-10-CM | POA: Diagnosis not present

## 2020-12-21 DIAGNOSIS — Z79899 Other long term (current) drug therapy: Secondary | ICD-10-CM | POA: Diagnosis not present

## 2020-12-21 DIAGNOSIS — C801 Malignant (primary) neoplasm, unspecified: Secondary | ICD-10-CM | POA: Diagnosis not present

## 2020-12-21 DIAGNOSIS — Z7901 Long term (current) use of anticoagulants: Secondary | ICD-10-CM | POA: Diagnosis not present

## 2020-12-21 DIAGNOSIS — C799 Secondary malignant neoplasm of unspecified site: Secondary | ICD-10-CM | POA: Diagnosis not present

## 2020-12-21 DIAGNOSIS — Z85118 Personal history of other malignant neoplasm of bronchus and lung: Secondary | ICD-10-CM | POA: Insufficient documentation

## 2020-12-21 DIAGNOSIS — J90 Pleural effusion, not elsewhere classified: Secondary | ICD-10-CM | POA: Insufficient documentation

## 2020-12-21 DIAGNOSIS — R17 Unspecified jaundice: Secondary | ICD-10-CM | POA: Diagnosis not present

## 2020-12-21 DIAGNOSIS — E876 Hypokalemia: Secondary | ICD-10-CM | POA: Insufficient documentation

## 2020-12-21 DIAGNOSIS — I2510 Atherosclerotic heart disease of native coronary artery without angina pectoris: Secondary | ICD-10-CM | POA: Diagnosis not present

## 2020-12-21 DIAGNOSIS — Z85810 Personal history of malignant neoplasm of tongue: Secondary | ICD-10-CM | POA: Insufficient documentation

## 2020-12-21 DIAGNOSIS — R180 Malignant ascites: Secondary | ICD-10-CM | POA: Diagnosis not present

## 2020-12-21 DIAGNOSIS — F17210 Nicotine dependence, cigarettes, uncomplicated: Secondary | ICD-10-CM | POA: Insufficient documentation

## 2020-12-21 DIAGNOSIS — I4510 Unspecified right bundle-branch block: Secondary | ICD-10-CM | POA: Diagnosis not present

## 2020-12-21 DIAGNOSIS — Z8546 Personal history of malignant neoplasm of prostate: Secondary | ICD-10-CM | POA: Diagnosis not present

## 2020-12-21 DIAGNOSIS — R1084 Generalized abdominal pain: Secondary | ICD-10-CM | POA: Diagnosis not present

## 2020-12-21 DIAGNOSIS — R0902 Hypoxemia: Secondary | ICD-10-CM | POA: Diagnosis not present

## 2020-12-21 DIAGNOSIS — I10 Essential (primary) hypertension: Secondary | ICD-10-CM | POA: Insufficient documentation

## 2020-12-21 DIAGNOSIS — E039 Hypothyroidism, unspecified: Secondary | ICD-10-CM | POA: Insufficient documentation

## 2020-12-21 DIAGNOSIS — K5900 Constipation, unspecified: Secondary | ICD-10-CM | POA: Diagnosis not present

## 2020-12-21 LAB — LIPASE, BLOOD: Lipase: 30 U/L (ref 11–51)

## 2020-12-21 LAB — COMPREHENSIVE METABOLIC PANEL
ALT: 25 U/L (ref 0–44)
AST: 55 U/L — ABNORMAL HIGH (ref 15–41)
Albumin: 2.2 g/dL — ABNORMAL LOW (ref 3.5–5.0)
Alkaline Phosphatase: 48 U/L (ref 38–126)
Anion gap: 8 (ref 5–15)
BUN: 23 mg/dL (ref 8–23)
CO2: 31 mmol/L (ref 22–32)
Calcium: 8.5 mg/dL — ABNORMAL LOW (ref 8.9–10.3)
Chloride: 99 mmol/L (ref 98–111)
Creatinine, Ser: 0.54 mg/dL — ABNORMAL LOW (ref 0.61–1.24)
GFR, Estimated: >60 mL/min (ref >60)
Glucose, Bld: 74 mg/dL (ref 70–99)
Potassium: 3.2 mmol/L — ABNORMAL LOW (ref 3.5–5.1)
Sodium: 138 mmol/L (ref 135–145)
Total Bilirubin: 0.2 mg/dL — ABNORMAL LOW (ref 0.3–1.2)
Total Protein: 6.3 g/dL — ABNORMAL LOW (ref 6.5–8.1)

## 2020-12-21 LAB — CBC WITH DIFFERENTIAL/PLATELET
Abs Immature Granulocytes: 0.1 10*3/uL — ABNORMAL HIGH (ref 0.00–0.07)
Basophils Absolute: 0 10*3/uL (ref 0.0–0.1)
Basophils Relative: 0 %
Eosinophils Absolute: 0 10*3/uL (ref 0.0–0.5)
Eosinophils Relative: 0 %
HCT: 37.7 % — ABNORMAL LOW (ref 39.0–52.0)
Hemoglobin: 12.2 g/dL — ABNORMAL LOW (ref 13.0–17.0)
Immature Granulocytes: 1 %
Lymphocytes Relative: 10 %
Lymphs Abs: 0.8 10*3/uL (ref 0.7–4.0)
MCH: 28.3 pg (ref 26.0–34.0)
MCHC: 32.4 g/dL (ref 30.0–36.0)
MCV: 87.5 fL (ref 80.0–100.0)
Monocytes Absolute: 0.6 10*3/uL (ref 0.1–1.0)
Monocytes Relative: 8 %
Neutro Abs: 6.5 10*3/uL (ref 1.7–7.7)
Neutrophils Relative %: 81 %
Platelets: 270 10*3/uL (ref 150–400)
RBC: 4.31 MIL/uL (ref 4.22–5.81)
RDW: 18.6 % — ABNORMAL HIGH (ref 11.5–15.5)
WBC: 8 10*3/uL (ref 4.0–10.5)
nRBC: 0 % (ref 0.0–0.2)

## 2020-12-21 MED ORDER — POTASSIUM CHLORIDE CRYS ER 20 MEQ PO TBCR: 20.0000 meq | EXTENDED_RELEASE_TABLET | Freq: Two times a day (BID) | ORAL | 0 refills | Status: DC

## 2020-12-21 MED ORDER — HYDROMORPHONE HCL 1 MG/ML IJ SOLN
1.0000 mg | Freq: Once | INTRAMUSCULAR | Status: AC
  Administered 2020-12-21: 14:00:00 1 mg via INTRAVENOUS
  Filled 2020-12-21: qty 1

## 2020-12-21 MED ORDER — IOHEXOL 300 MG/ML  SOLN
100.0000 mL | Freq: Once | INTRAMUSCULAR | Status: AC | PRN
  Administered 2020-12-21: 15:00:00 100 mL via INTRAVENOUS

## 2020-12-21 MED ORDER — POLYETHYLENE GLYCOL 3350 17 G PO PACK: 17.0000 g | PACK | Freq: Every day | ORAL | 2 refills | Status: DC

## 2020-12-21 MED ORDER — SODIUM CHLORIDE 0.9 % IV SOLN: INTRAVENOUS | Status: DC

## 2020-12-21 NOTE — Discharge Instructions (Addendum)
Your appointment with your hematologist oncologist as scheduled for tomorrow.  They do want you to increase your tube feedings.  They also are working on arrangement for a pleural catheter to drain the pleural effusion.  CT shows fluid in the abdomen.  But abdomen is not tense.  Subtle thing that needs to be drained at this point in time.  Potassium was a little low take the potassium supplement as directed.  Did show evidence of constipation which is probably related to your pain medications.  Take MiraLAX as needed.  No other significant or acute findings in the abdomen.

## 2020-12-21 NOTE — ED Triage Notes (Signed)
Pt here from home with c/o abd pain , according to ems family states that he may have not had a BM in 1 to 3 weeks , pt has hx of Ca gas a trach , peg tube and implanted morphine pump

## 2020-12-21 NOTE — ED Provider Notes (Addendum)
[facility] EMERGENCY DEPARTMENT Provider Note   CSN: [id number] Arrival date & time: 12/21/20  1032     History No chief complaint on file.   Darrell White is a 70 y.o. male.  Patient followed by hematology oncology at [facility].  Patient also seen by them on November 10.  At that time they confirmed that he is got metastatic disease from either lung or tongue.  They recommended chemo but patient was not sure what he wanted to do but according to family chemo was arranged for tomorrow.  They also wanted his pleural effusion drained which they were working on.  Patient was seen at [facility] on November 9.  Had low oxygen sats at that time.  Oxygen sats here are good at 98%.  They recommended admission to [facility]  The patient left AMA.  Also hematology oncology recommended increasing the tube feedings.  Is not clear whether the patient has done that.  Patient not eating well.  According to family patient lives by himself.  Patient not doing very well at all.  Brought here by EMS.  Patient's main complaint is abdominal pain.  Known to have metastatic disease to include abdomen and lung area..      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    Patient Active Problem List   Diagnosis Date Noted   Mechanical breakdown of spinal infusion catheter 12/04/2020   Chronic use of opiate for therapeutic purpose 06/22/2020   Presence of intrathecal pump 06/08/2020   GERD (gastroesophageal reflux disease) 06/08/2020   Paroxysmal A-fib (HCC) 06/08/2020   Malnutrition  (HCC) 05/30/2020   Opiate withdrawal (HCC) 05/17/2020   Prostate cancer (HCC) 04/28/2020   Cervicalgia 03/30/2020   Chronic neck and back pain 03/30/2020   Chronic Acromioclavicular (AC) joint pain (Left) 01/06/2020   Chronic glenohumeral joint pain (Left) 01/06/2020   Arthralgia of shoulder region (Left) 01/06/2020   Uncomplicated opioid dependence (HCC) 12/23/2019   Chronic shoulder pain (Right) 12/23/2019   Osteoarthritis of shoulder (Right) 12/23/2019   Chronic bursitis of shoulder (Right) 12/23/2019   Chronic shoulder pain (Left) 06/24/2019   Osteoarthritis of shoulder (Left) 06/24/2019   Chronic anticoagulation (Eliquis) 06/24/2019   Osteoarthritis of hip (Left) 03/19/2019   Mixed hyperlipidemia 01/26/2019   High cholesterol 12/22/2018   HTN (hypertension) 12/22/2018   Atrial fibrillation (HCC) 10/05/2018   Chronic bronchitis (HCC) 09/26/2018   Demand ischemia (HCC) 09/26/2018   Atrial flutter with rapid ventricular response (HCC) 03/20/2018   Coronary artery disease involving native coronary artery of native heart 03/16/2018   Abnormal stress test 03/12/2018   Atrial flutter (HCC) 03/08/2018   Hypothyroid 03/08/2018   Tobacco abuse 03/08/2018   Adjustment and management of infusion pump 12/31/2016   Lumbar facet syndrome (Bilateral) 12/31/2016   Osteoarthritis of lumbar spine 12/31/2016   Cancer-related pain 10/25/2016   Neurogenic pain 10/25/2016   Neuropathic pain 10/25/2016   Chronic musculoskeletal pain 10/25/2016  Disorder of skeletal system 10/25/2016   Pharmacologic therapy 10/25/2016   Problems influencing health status 10/25/2016   DDD (degenerative disc disease), cervical 10/25/2016   DDD (degenerative disc disease), lumbar 10/25/2016   Cervical spondylosis 10/25/2016   Lumbar spondylosis 10/25/2016   Chronic low back pain (1ry area of Pain) (Bilateral) (R>L) 10/16/2016   Chronic lower extremity pain (2ry area of Pain) (Bilateral) (R>L) 10/16/2016    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   Chronic neck pain (3ry area of Pain) (Left) 10/16/2016   History of cervical spinal surgery 10/16/2016   Chronic pain syndrome 10/16/2016   Long term (current) use of opiate analgesic 10/16/2016   Long term prescription opiate use 10/16/2016   Opiate use 10/16/2016   Carcinoma of base of tongue ([place]) 06/18/2016   Primary cancer of right upper lobe of lung ([place]) 06/04/2016   Malignant neoplasm of tongue, unspecified ([place]) 04/02/2016   Presence of implanted infusion pump 09/29/2012    Past Surgical History:  Procedure Laterality Date   BACK SURGERY     had total of 7 surgeries with rods and plates   CATARACT EXTRACTION W/PHACO Left 04/09/2012   Procedure: CATARACT EXTRACTION PHACO AND INTRAOCULAR LENS PLACEMENT ([place]);  Surgeon: Tonny Branch, MD;  Location: [facility];  Service: Ophthalmology;  Laterality: Left;  CDE: 12.44   CERVICAL DISC SURGERY     CT LUNG SCREENING  06/16/2017   DIRECT LARYNGOSCOPY N/A 03/26/2016   Procedure: DIRECT LARYNGOSCOPY;  Surgeon: Leta Baptist, MD;  Location: [place];  Service: ENT;  Laterality: N/A;   DIRECT LARYNGOSCOPY N/A 05/31/2019   Procedure: DIRECT LARYNGOSCOPY WITH BIOPSY;  Surgeon: Leta Baptist, MD;  Location: [place];  Service: ENT;  Laterality: N/A;   EXCISION OF TONGUE LESION Right 03/26/2016   Procedure: BIOPSY OF RIGHT TONGUE BASE MASS;  Surgeon: Leta Baptist, MD;  Location: [place];  Service: ENT;  Laterality: Right;   INTRATHECAL PUMP IMPLANT Right 08/02/2019   Procedure: REPLACEMENT RIGHT ABDOMINAL PROGRAMMABLE MORPHINE PUMP;  Surgeon: Deetta Perla, MD;  Location: [facility];  Service: Neurosurgery;  Laterality: Right;   KNEE ARTHROSCOPY     right knee   MOUTH SURGERY     removal of cancer   OTHER SURGICAL HISTORY     insertion of pain pump   SHOULDER ARTHROSCOPY     bilateral   SPINAL FUSION     SPINE SURGERY     insertion of morphine pump into spine        Family History  Problem Relation Age of Onset   Cancer Father    Cancer Brother        Prostate cancer    Social History   Tobacco Use   Smoking status: Every Day    Packs/day: 2.00    Years: 55.00    Pack years: 110.00    Types: Cigarettes   Smokeless tobacco: Never  Vaping Use   Vaping Use: Never used  Substance Use Topics   Alcohol use: No    Comment: H/O EtOHism drinking heavy- boubon/beer, quit ~ 2000   Drug use: No    Home Medications Prior to Admission medications   Medication Sig Start Date End Date Taking? Authorizing Provider  amiodarone (PACERONE) 200 MG tablet Take 200 mg by mouth daily.  10/27/18  Yes [provider]  apixaban (ELIQUIS) 5 MG TABS tablet Take 5 mg by mouth daily. 05/19/20  Yes [provider]  atorvastatin (LIPITOR) 40 MG tablet Take  40 mg by mouth daily.    Yes [provider]  cloNIDine (CATAPRES) 0.1 MG tablet Take 1 tablet (0.1 mg total) by mouth at bedtime for 14 days. Discontinue his orthostatic hypotension is experienced. 05/17/20 12/21/20 Yes Delano Metz, MD  diclofenac sodium (VOLTAREN) 1 % GEL Apply 2 g topically 3 (three) times daily as needed.  11/04/18  Yes [provider]  furosemide (LASIX) 20 MG tablet Take 20 mg by mouth daily. 12/14/20  Yes [provider]  gabapentin (NEURONTIN) 600 MG tablet Take 600 mg by mouth 3 (three) times daily. 08/30/20  Yes [provider]  levofloxacin (LEVAQUIN) 500 MG tablet TAKE 1 TABLET BY MOUTH DAILY 04/24/20  Yes McKenzie, Mardene Celeste, MD  levothyroxine (SYNTHROID) 125 MCG tablet Take 125 mcg by mouth daily before breakfast.  10/21/18  Yes [provider]  metoprolol succinate (TOPROL-XL) 50 MG 24 hr tablet Take 50 mg by mouth daily.  08/28/18  Yes [provider]  mirtazapine (REMERON) 45 MG tablet Take 45 mg by mouth at bedtime.   Yes [provider]  naloxone [facility]) 2 MG/2ML injection Inject 1 mL (1 mg  total) into the muscle as needed for up to 2 doses (for opioid overdose). Inject content of syringe into thigh muscle. Call 911. 03/30/20 03/30/21 Yes Delano Metz, MD  [facility] 4 MG/0.1ML LIQD nasal spray kit SMARTSIG:Both Nares 03/30/20  Yes [provider]  OLANZapine (ZYPREXA) 5 MG tablet Take 5 mg by mouth at bedtime. 12/13/20  Yes [provider]  pantoprazole (PROTONIX) 40 MG tablet Take 40 mg by mouth daily. 04/19/20  Yes [provider]  polyethylene glycol (MIRALAX) 17 g packet Take 17 g by mouth daily. 12/21/20  Yes Vanetta Mulders, MD  potassium chloride SA (KLOR-CON) 20 MEQ tablet Take 1 tablet (20 mEq total) by mouth 2 (two) times daily. 12/21/20  Yes Vanetta Mulders, MD  PRESCRIPTION MEDICATION Morphine pump been on since 1995 refilled every 3 months.   Yes [provider]  sotalol (BETAPACE) 120 MG tablet Take 120 mg by mouth daily.    Yes [provider]  traZODone (DESYREL) 50 MG tablet Take 200 mg by mouth at bedtime.    Yes [provider]  albuterol (VENTOLIN HFA) 108 (90 Base) MCG/ACT inhaler Inhale into the lungs. Patient not taking: Reported on 12/21/2020 02/19/18   [provider]  methocarbamol (ROBAXIN) 500 MG tablet Take 1 tablet (500 mg total) by mouth every 8 (eight) hours as needed for muscle spasms. 03/30/20 06/28/20  Delano Metz, MD  Oxycodone HCl 10 MG TABS Take 1 tablet (10 mg total) by mouth every 6 (six) hours as needed. Must last 30 days 11/19/20 12/19/20  Delano Metz, MD  tiotropium (SPIRIVA) 18 MCG inhalation capsule Place 18 mcg into inhaler and inhale daily.  Patient not taking: Reported on 12/21/2020    [provider]    Allergies    Nsaids  Review of Systems   Review of Systems  Constitutional:  Positive for activity change and appetite change. Negative for chills and fever.  HENT:  Negative for ear pain and sore throat.   Eyes:  Negative for pain and visual  disturbance.  Respiratory:  Positive for cough. Negative for shortness of breath.   Cardiovascular:  Negative for chest pain and palpitations.  Gastrointestinal:  Positive for abdominal pain. Negative for vomiting.  Genitourinary:  Negative for dysuria and hematuria.  Musculoskeletal:  Negative for arthralgias and back pain.  Skin:  Negative for color change and rash.  Neurological:  Negative for seizures and syncope.  All other systems reviewed and are negative.  Physical Exam Updated Vital Signs BP 115/66 (BP Location: Left Arm)   Pulse 95   Temp 97.9 F (36.6 C)   Resp 13   SpO2 97%   Physical Exam Vitals and nursing note reviewed.  Constitutional:      General: He is not in acute distress.    Appearance: He is well-developed. He is ill-appearing.     Comments: Cachectic in appearance  HENT:     Head: Normocephalic and atraumatic.  Eyes:     Conjunctiva/sclera: Conjunctivae normal.  Neck:     Comments: Trach in place Cardiovascular:     Rate and Rhythm: Normal rate and regular rhythm.     Heart sounds: No murmur heard. Pulmonary:     Effort: Pulmonary effort is normal. No respiratory distress.     Breath sounds: Rhonchi present.  Abdominal:     Palpations: Abdomen is soft.     Tenderness: There is no abdominal tenderness.     Comments: Abdomen soft G-tube in place may be some slight distention but not significantly distended.  Nontender.  Musculoskeletal:        General: No swelling.     Cervical back: Neck supple.  Skin:    General: Skin is warm and dry.     Capillary Refill: Capillary refill takes less than 2 seconds.  Neurological:     Mental Status: He is alert.  Psychiatric:        Mood and Affect: Mood normal.    ED Results / Procedures / Treatments   Labs (all labs ordered are listed, but only abnormal results are displayed) Labs Reviewed  CBC WITH DIFFERENTIAL/PLATELET - Abnormal; Notable for the following components:      Result Value    Hemoglobin 12.2 (*)    HCT 37.7 (*)    RDW 18.6 (*)    Abs Immature Granulocytes 0.10 (*)    All other components within normal limits  COMPREHENSIVE METABOLIC PANEL - Abnormal; Notable for the following components:   Potassium 3.2 (*)    Creatinine, Ser 0.54 (*)    Calcium 8.5 (*)    Total Protein 6.3 (*)    Albumin 2.2 (*)    AST 55 (*)    Total Bilirubin 0.2 (*)    All other components within normal limits  LIPASE, BLOOD    EKG None  Radiology CT Abdomen Pelvis W Contrast  Result Date: 12/21/2020 CLINICAL DATA:  Abdominal pain. Acute, non localized. No bowel movement over the last several weeks. History of head and neck cancer and lung cancer. EXAM: CT ABDOMEN AND PELVIS WITH CONTRAST TECHNIQUE: Multidetector CT imaging of the abdomen and pelvis was performed using the standard protocol following bolus administration of intravenous contrast. CONTRAST:  144mL OMNIPAQUE IOHEXOL 300 MG/ML  SOLN COMPARISON:  PET scan 05/15/2020 FINDINGS: Lower chest: Large right effusion with complete collapse of the right lower lung. No definite pleural nodule. Left lung bases clear. Hepatobiliary: Liver parenchyma appears normal. No calcified gallstones. Pancreas: Normal Spleen: Normal Adrenals/Urinary Tract: Adrenal glands are normal. Kidneys are normal. Bladder is normal. Stomach/Bowel: Large amount of stool within the colon consistent with constipation. Gastrostomy tube in place. Vascular/Lymphatic: Aortic atherosclerosis. No aneurysm. IVC is normal. No retroperitoneal adenopathy. Reproductive: Normal Other: Large amount of ascites, freely distributed. Musculoskeletal: Previous lower lumbar fusion. Degenerative changes above that. Spinal neurostimulator in place. IMPRESSION: No  change since the study of 8 days ago. Large right effusion with complete collapse of the right lower lung. Large amount of ascites, freely distributed. Gastrostomy tube in place. Large amount of fecal matter within the colon  suggesting constipation. No evidence of solid organ tumor. Aortic Atherosclerosis (ICD10-I70.0). Electronically Signed   By: Nelson Chimes M.D.   On: 12/21/2020 15:07    Procedures Procedures   Medications Ordered in ED Medications  0.9 %  sodium chloride infusion (has no administration in time range)  HYDROmorphone (DILAUDID) injection 1 mg (1 mg Intravenous Given 12/21/20 1423)  iohexol (OMNIPAQUE) 300 MG/ML solution 100 mL (100 mLs Intravenous Contrast Given 12/21/20 1446)    ED Course  I have reviewed the triage vital signs and the nursing notes.  Pertinent labs & imaging results that were available during my care of the patient were reviewed by me and considered in my medical decision making (see chart for details).    MDM Rules/Calculators/A&P                           Patient with fairly significant cancer history.  Metastatic disease Po start chemo tomorrow.  Patient known to have pleural effusion.  Patient complaining today of abdominal pain we will get CT abdomen basic labs.  Patient's oxygen saturations are fine here today.  Patient's labs without significant abnormality.  Actually they are fairly remarkable based on his diagnosis.  CT scan of the abdomen showed some constipation.  Showed some ascites but patient's abdomen is not overly distended.  Shows the pleural effusion.  No significant changes from just about a week ago.  Patient has follow-up with hematology oncology tomorrow.  They are planning to arrange for a pleural catheter.  Patient in no respiratory distress.  Patient's potassium a little low at 3.3.  And will give MiraLAX as well as potassium supplement.  Final Clinical Impression(s) / ED Diagnoses Final diagnoses:  Metastatic malignant neoplasm, unspecified site [facility])  Malignant ascites  Pleural effusion  Constipation, unspecified constipation type  Hypokalemia    Rx / DC Orders ED Discharge Orders          Ordered    polyethylene glycol (MIRALAX) 17 g  packet  Daily        12/21/20 1614    potassium chloride SA (KLOR-CON) 20 MEQ tablet  2 times daily        12/21/20 1614             Fredia Sorrow, MD 12/21/20 1613    Fredia Sorrow, MD 12/21/20 1620

## 2020-12-21 NOTE — ED Notes (Signed)
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

## 2020-12-22 DIAGNOSIS — C01 Malignant neoplasm of base of tongue: Secondary | ICD-10-CM | POA: Diagnosis not present

## 2020-12-22 DIAGNOSIS — C029 Malignant neoplasm of tongue, unspecified: Secondary | ICD-10-CM | POA: Diagnosis not present

## 2020-12-22 DIAGNOSIS — C44321 Squamous cell carcinoma of skin of nose: Secondary | ICD-10-CM | POA: Diagnosis not present

## 2020-12-22 DIAGNOSIS — C3411 Malignant neoplasm of upper lobe, right bronchus or lung: Secondary | ICD-10-CM | POA: Diagnosis not present

## 2021-01-02 ENCOUNTER — Telehealth: Payer: Self-pay | Admitting: Pain Medicine

## 2021-01-02 NOTE — Telephone Encounter (Signed)
Attempted to call Gerre Pebbles, message left.

## 2021-01-02 NOTE — Telephone Encounter (Signed)
Karon from Hospice called stating Mr Darrell White is in hospice and will not be able to come in for his pump refill on Thurs. They need advice on what to do to help with his pain since he will not be able to get pump filled

## 2021-01-03 ENCOUNTER — Telehealth: Payer: Self-pay

## 2021-01-03 NOTE — Telephone Encounter (Signed)
Talked with patient and he is stating that he wont be able to come in tomorrow for his pump refill. I told him that I was talking with the Hospice nurse and that his pain would be taking care of and not to worry.

## 2021-01-03 NOTE — Telephone Encounter (Signed)
Hospice called stating patient also needs refills on his pain meds. Please advise them of status

## 2021-01-03 NOTE — Telephone Encounter (Signed)
Called and talked with Hospice nurse. Discussed if we need to refill or shut off. Will discuss with Dr Dossie Arbour what the options will be.

## 2021-01-03 NOTE — Telephone Encounter (Signed)
Talked with Gerre Pebbles about Dr Dossie Arbour not being able to manage home with him at home. She wanted to be what  dose of Mso4 would match the pump to manage so he would go through withdrawals. Found out that patient is at home and not in the hospice home. Hospice is going out to his house to check on him.Gerre Pebbles states that Patient and his wife state that they are unable to get to any appointments.

## 2021-01-03 NOTE — Telephone Encounter (Signed)
Talked with Erasmo Downer ([employer]) and she is going to see if she can find a mobile person to manage his pump. Will call me back.

## 2021-01-03 NOTE — Telephone Encounter (Signed)
Wanting a refill on pain meds that run out today. Unable to make appt tomorrow

## 2021-01-03 NOTE — Telephone Encounter (Signed)
Called patients wife and she stated that they were unable to get out for any appointments.Told her that Darrell White had enough medication in is pump to last until 01/31/21 and that I and hospice nurse was talking about what needs to be done to help with IT pump. Patient with understanding.

## 2021-01-03 NOTE — Telephone Encounter (Signed)
Talked with Dr Dossie Arbour and he states that he would not be able to manage from offsite. Message left for [employer] rep to call us to see about management or turning pump off.

## 2021-01-04 ENCOUNTER — Encounter: Payer: PPO | Admitting: Pain Medicine

## 2021-02-04 DEATH — deceased
